# Patient Record
Sex: Female | Born: 1970 | Race: White | Hispanic: Yes | Marital: Single | Smoking: Never smoker
Health system: Southern US, Community
[De-identification: ages and names within clinical notes are randomized; demographics above are authoritative.]

## PROBLEM LIST (undated history)

## (undated) DIAGNOSIS — F329 Major depressive disorder, single episode, unspecified: Secondary | ICD-10-CM

## (undated) DIAGNOSIS — H3530 Unspecified macular degeneration: Secondary | ICD-10-CM

## (undated) DIAGNOSIS — I10 Essential (primary) hypertension: Secondary | ICD-10-CM

## (undated) DIAGNOSIS — F32A Depression, unspecified: Secondary | ICD-10-CM

## (undated) DIAGNOSIS — J302 Other seasonal allergic rhinitis: Secondary | ICD-10-CM

## (undated) DIAGNOSIS — Z86010 Personal history of colonic polyps: Secondary | ICD-10-CM

## (undated) DIAGNOSIS — R87619 Unspecified abnormal cytological findings in specimens from cervix uteri: Secondary | ICD-10-CM

## (undated) DIAGNOSIS — H269 Unspecified cataract: Secondary | ICD-10-CM

## (undated) DIAGNOSIS — Z860101 Personal history of adenomatous and serrated colon polyps: Secondary | ICD-10-CM

## (undated) DIAGNOSIS — O021 Missed abortion: Secondary | ICD-10-CM

## (undated) DIAGNOSIS — N870 Mild cervical dysplasia: Secondary | ICD-10-CM

## (undated) DIAGNOSIS — Z8742 Personal history of other diseases of the female genital tract: Secondary | ICD-10-CM

## (undated) DIAGNOSIS — Z973 Presence of spectacles and contact lenses: Secondary | ICD-10-CM

## (undated) DIAGNOSIS — T7840XA Allergy, unspecified, initial encounter: Secondary | ICD-10-CM

## (undated) DIAGNOSIS — H35039 Hypertensive retinopathy, unspecified eye: Secondary | ICD-10-CM

## (undated) DIAGNOSIS — S7400XA Injury of sciatic nerve at hip and thigh level, unspecified leg, initial encounter: Secondary | ICD-10-CM

## (undated) DIAGNOSIS — B977 Papillomavirus as the cause of diseases classified elsewhere: Secondary | ICD-10-CM

## (undated) DIAGNOSIS — N879 Dysplasia of cervix uteri, unspecified: Secondary | ICD-10-CM

## (undated) DIAGNOSIS — N893 Dysplasia of vagina, unspecified: Secondary | ICD-10-CM

## (undated) DIAGNOSIS — Z87442 Personal history of urinary calculi: Secondary | ICD-10-CM

## (undated) HISTORY — DX: Unspecified cataract: H26.9

## (undated) HISTORY — DX: Allergy, unspecified, initial encounter: T78.40XA

## (undated) HISTORY — DX: Injury of sciatic nerve at hip and thigh level, unspecified leg, initial encounter: S74.00XA

## (undated) HISTORY — DX: Missed abortion: O02.1

## (undated) HISTORY — DX: Unspecified abnormal cytological findings in specimens from cervix uteri: R87.619

## (undated) HISTORY — DX: Essential (primary) hypertension: I10

## (undated) HISTORY — DX: Hypertensive retinopathy, unspecified eye: H35.039

## (undated) HISTORY — DX: Depression, unspecified: F32.A

## (undated) HISTORY — DX: Papillomavirus as the cause of diseases classified elsewhere: B97.7

## (undated) HISTORY — DX: Mild cervical dysplasia: N87.0

---
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
## 2005-06-11 ENCOUNTER — Other Ambulatory Visit: Admission: RE | Admit: 2005-06-11 | Discharge: 2005-06-11 | Payer: Self-pay | Admitting: Obstetrics and Gynecology

## 2006-09-26 ENCOUNTER — Other Ambulatory Visit: Admission: RE | Admit: 2006-09-26 | Discharge: 2006-09-26 | Payer: Self-pay | Admitting: Gynecology

## 2006-10-11 HISTORY — PX: MYOMECTOMY: SHX85

## 2006-11-09 ENCOUNTER — Ambulatory Visit (HOSPITAL_COMMUNITY): Admission: RE | Admit: 2006-11-09 | Discharge: 2006-11-09 | Payer: Self-pay | Admitting: Obstetrics and Gynecology

## 2006-11-25 ENCOUNTER — Encounter (INDEPENDENT_AMBULATORY_CARE_PROVIDER_SITE_OTHER): Payer: Self-pay | Admitting: *Deleted

## 2006-11-25 ENCOUNTER — Ambulatory Visit (HOSPITAL_BASED_OUTPATIENT_CLINIC_OR_DEPARTMENT_OTHER): Admission: RE | Admit: 2006-11-25 | Discharge: 2006-11-25 | Payer: Self-pay | Admitting: Gynecology

## 2006-11-25 HISTORY — PX: HYSTEROSCOPY WITH RESECTOSCOPE: SHX5395

## 2007-09-29 ENCOUNTER — Other Ambulatory Visit: Admission: RE | Admit: 2007-09-29 | Discharge: 2007-09-29 | Payer: Self-pay | Admitting: Gynecology

## 2008-01-16 ENCOUNTER — Other Ambulatory Visit: Admission: RE | Admit: 2008-01-16 | Discharge: 2008-01-16 | Payer: Self-pay | Admitting: Gynecology

## 2008-07-30 ENCOUNTER — Other Ambulatory Visit: Admission: RE | Admit: 2008-07-30 | Discharge: 2008-07-30 | Payer: Self-pay | Admitting: Gynecology

## 2008-07-30 ENCOUNTER — Ambulatory Visit: Payer: Self-pay | Admitting: Gynecology

## 2008-07-30 ENCOUNTER — Encounter: Payer: Self-pay | Admitting: Gynecology

## 2009-04-11 ENCOUNTER — Encounter: Payer: Self-pay | Admitting: Gynecology

## 2009-04-11 ENCOUNTER — Ambulatory Visit: Payer: Self-pay | Admitting: Gynecology

## 2009-04-11 ENCOUNTER — Other Ambulatory Visit: Admission: RE | Admit: 2009-04-11 | Discharge: 2009-04-11 | Payer: Self-pay | Admitting: Gynecology

## 2009-04-21 ENCOUNTER — Ambulatory Visit: Payer: Self-pay | Admitting: Gynecology

## 2009-05-07 ENCOUNTER — Ambulatory Visit (HOSPITAL_COMMUNITY): Admission: RE | Admit: 2009-05-07 | Discharge: 2009-05-07 | Payer: Self-pay | Admitting: Gynecology

## 2009-10-21 ENCOUNTER — Ambulatory Visit: Payer: Self-pay | Admitting: Gynecology

## 2010-02-03 ENCOUNTER — Ambulatory Visit: Payer: Self-pay | Admitting: Gynecology

## 2010-04-20 ENCOUNTER — Other Ambulatory Visit: Admission: RE | Admit: 2010-04-20 | Discharge: 2010-04-20 | Payer: Self-pay | Admitting: Gynecology

## 2010-04-20 ENCOUNTER — Ambulatory Visit: Payer: Self-pay | Admitting: Gynecology

## 2010-04-27 ENCOUNTER — Ambulatory Visit: Payer: Self-pay | Admitting: Gynecology

## 2010-05-26 ENCOUNTER — Ambulatory Visit (HOSPITAL_COMMUNITY): Admission: RE | Admit: 2010-05-26 | Discharge: 2010-05-26 | Payer: Self-pay | Admitting: Gynecology

## 2011-02-26 NOTE — H&P (Signed)
NAMEFIONNA, [name]             ACCOUNT NO.:  [id number]   MEDICAL RECORD NO.:  [id number]          PATIENT TYPE:  AMB   LOCATION:  [facility]                         FACILITY:  [facility]   PHYSICIAN:  Juan H. [name], M.D.DATE OF BIRTH:  1970-12-16   DATE OF ADMISSION:  11/25/2006  DATE OF DISCHARGE:                              HISTORY & PHYSICAL   CHIEF COMPLAINT:  Intrauterine lesion.   HISTORY OF PRESENT ILLNESS:  The patient is a 40 year old gravida 0, who  was seen as a new patient September 26, 2006.  She was noted to have at  time of examination a slightly irregular uterus and ultrasound had been  ordered.  The ultrasound had demonstrated intramural and subserosal  myoma, the largest measuring 34 x 21 mm.  She underwent a  sonohysterogram which demonstrated intrauterine defect measuring 18 x 19  x 10 mm with positive vascular flow from the feeder vessels, right ovary  dominant follicle, normal left ovary.  The patient is scheduled to  undergo resectoscopic polypectomy/myomectomy on Friday, the 15th.   ALLERGIES:  No known drug allergies.   PAST MEDICAL HISTORY:  They are using condoms for contraception.  No  prior surgery reported.  Currently on no medication.   FAMILY HISTORY:  Father with diabetes and mother with hypertension.   PHYSICAL EXAMINATION:  VITAL SIGNS:  The patient weighs 148 pounds,  height 5 feet 4-1/2 inches tall, blood pressure 130/70.  HEENT:  Unremarkable.  NECK:  Supple.  Trachea midline.  No carotid bruits.  No thyromegaly.  LUNGS:  Clear to auscultation without rhonchi or wheezes.  HEART:  Regular rate and rhythm without murmurs or gallops.  BREASTS:  Not done.  ABDOMEN:  Soft and nontender without rebound or guarding.  PELVIC:  Bartholin's, urethra, and Skene's glands within normal limits.  Vagina and cervix with no lesions or discharge.  Uterus anteverted and  slightly irregular.  Adnexa with no palpable mass or tenderness.  RECTAL:  Deferred.   ASSESSMENT:  A 40 year old gravida 0 with several small intramural and  subserosal leiomyoma, but an intracavitary submucous myoma versus a  polyp.  The patient is scheduled to undergo resectoscopic  polypectomy/myomectomy and the patient is scheduled to come by the  office on February 14, for placement of intracervical laminaria.  The  risks, benefits, pros, and cons were discussed with the patient and we  will follow accordingly.   PLAN:  The patient is scheduled for resectoscopic polypectomy/myomectomy  on Friday, February 15, at 9 a.m. at [facility].      Juan H. [name], M.D.  Electronically Signed     JHF/MEDQ  D:  11/24/2006  T:  11/24/2006  Job:  [id number]

## 2011-02-26 NOTE — Op Note (Signed)
[name], [name]             ACCOUNT NO.:  [id number]   MEDICAL RECORD NO.:  [id number]          PATIENT TYPE:  AMB   LOCATION:  [facility]                         FACILITY:  [facility]   PHYSICIAN:  Juan H. [name], M.D.DATE OF BIRTH:  Jun 01, 1971   DATE OF PROCEDURE:  11/25/2006  DATE OF DISCHARGE:                               OPERATIVE REPORT   SURGEON:  Juan H. [name], M.D.   INDICATIONS FOR OPERATION:  [age]-year-old gravida 0 who was  found as an incidental finding at time of annual gynecological  examination to have any regular-shaped uterus, underwent an ultrasound  and there was a questionable intrauterine lesion, so she subsequently  underwent a sonohysterogram which demonstrated intrauterine defect  measuring 18 x 19 x 10 mm with possible vascular flow with feeder  vessels.  The rest of the ultrasound was normal.   PREOPERATIVE DIAGNOSIS:  Intrauterine lesion, suspect endometrial  polyp/myoma.   POSTOPERATIVE DIAGNOSES:  1. Submucous myoma.  2. Endometrial polyps.   PROCEDURE PERFORMED:  1. Resectoscopic polypectomies.  2. Resectoscope myomectomy.   FINDINGS:  Normal endocervical canal.  Several small intrauterine polyps  around the right uterine sidewall and a 2 x 3-cm submucous myoma coming  off the anterior left uterine wall.  No other intracavitary lesions were  noted.  Due to the time of the cycle, the tubal ostia were difficult to  evaluate; only the right side was seen.   DESCRIPTION OF OPERATION:  After the patient adequately counseled, she  was taken to the operating room, where she underwent a successful  general anesthesia.  She had received PSA stockings for DVT prophylaxis  and also had received a gram of cefoxitin for prophylaxis as well.  After the general endotracheal anesthesia was obtained, the abdomen was  prepped and draped in the usual sterile fashion.  The laminaria that was  previously placed the night before was removed.  The  vagina and cervix  were prepped with Betadine solution.  A red rubber Roxan Hockey was inserted  to evacuate the bladder of its contents for 75 mL.  Examination under  anesthesia demonstrated an irregular-shaped uterus, anteverted; a small  anterior subserosal fibroid was noted, no palpable adnexal masses.  A  Graves speculum was introduced into the vaginal wall and a single-tooth  tenaculum was placed onto the anterior cervical lip.  The Lendell Caprice  operative resectoscope with a 90-degree wire loop was inserted into the  intrauterine cavity; 3% sorbitol was the distending media.  In a  systematic fashion, the cervix and uterus were inspected with the above-  mentioned findings.  The [facility] surgical generator set at  80 watts on the cutting mode and 80 watts on the cautery mode was used  and the submucous myoma was resected off its base and passed off the  operative field and the remaining endometrial polyps were excised and  submitted separately as well. The patient did also undergo a vigorous  curettage and that specimen was submitted along with the endometrial  polyps.  The patient tolerated the procedure well.  Pre- and post-  resection pictures were obtained; a  copy will be kept in the patient's  record and one at the office in [facility] and we will show the patient at the  time of her postop visit..  The  single-tooth tenaculum was removed.  The patient was extubated and  transferred to recovery room with stable vital signs.  Blood loss was  minimal.  Fluid resuscitation consisted of 800 mL of lactated Ringer's,  urine output 75 mL and she received a gram of cefoxitin IV and 3%  sorbitol fluid deficit was 140 mL.      Juan H. [name], M.D.  Electronically Signed     JHF/MEDQ  D:  11/25/2006  T:  11/25/2006  Job:  [id number]

## 2011-06-01 ENCOUNTER — Encounter: Payer: Self-pay | Admitting: Gynecology

## 2011-06-02 ENCOUNTER — Encounter: Payer: Self-pay | Admitting: Gynecology

## 2011-06-16 ENCOUNTER — Other Ambulatory Visit (HOSPITAL_COMMUNITY)
Admission: RE | Admit: 2011-06-16 | Discharge: 2011-06-16 | Disposition: A | Discharge status: Home / Self Care | Payer: Commercial Managed Care - PPO | Source: Ambulatory Visit | Attending: Gynecology | Admitting: Gynecology

## 2011-06-16 ENCOUNTER — Encounter: Payer: Self-pay | Admitting: Gynecology

## 2011-06-16 ENCOUNTER — Other Ambulatory Visit: Payer: Self-pay | Admitting: Gynecology

## 2011-06-16 ENCOUNTER — Ambulatory Visit (INDEPENDENT_AMBULATORY_CARE_PROVIDER_SITE_OTHER): Payer: Commercial Managed Care - PPO | Admitting: Gynecology

## 2011-06-16 VITALS — BP 142/86 | Ht 64.5 in | Wt 153.0 lb

## 2011-06-16 DIAGNOSIS — R635 Abnormal weight gain: Secondary | ICD-10-CM

## 2011-06-16 DIAGNOSIS — D251 Intramural leiomyoma of uterus: Secondary | ICD-10-CM

## 2011-06-16 DIAGNOSIS — D259 Leiomyoma of uterus, unspecified: Secondary | ICD-10-CM

## 2011-06-16 DIAGNOSIS — D252 Subserosal leiomyoma of uterus: Secondary | ICD-10-CM

## 2011-06-16 DIAGNOSIS — Z13220 Encounter for screening for lipoid disorders: Secondary | ICD-10-CM

## 2011-06-16 DIAGNOSIS — N852 Hypertrophy of uterus: Secondary | ICD-10-CM

## 2011-06-16 DIAGNOSIS — Z01419 Encounter for gynecological examination (general) (routine) without abnormal findings: Secondary | ICD-10-CM

## 2011-06-16 DIAGNOSIS — N831 Corpus luteum cyst of ovary, unspecified side: Secondary | ICD-10-CM

## 2011-06-16 DIAGNOSIS — I10 Essential (primary) hypertension: Secondary | ICD-10-CM

## 2011-06-16 LAB — COMPREHENSIVE METABOLIC PANEL
Alkaline Phosphatase: 65 U/L (ref 39–117)
CO2: 25 mEq/L (ref 19–32)
Potassium: 4 mEq/L (ref 3.5–5.3)

## 2011-06-16 MED ORDER — LISINOPRIL 20 MG PO TABS: 20.0000 mg | ORAL_TABLET | Freq: Every day | ORAL | Status: DC

## 2011-06-16 NOTE — Progress Notes (Signed)
[name] Aug 19, 1971 [id number]   History:    40 y.o.  for annual exam with history of leiomyomatous uteri. Her primary physician is Dr.Hopper who has been treating her for hypertension and she has not seen him in a year. She is on lisinopril 70 mg daily. Blood pressure today 142/86. Patient asymptomatic. No longer using Depo-Provera for contraception only condoms. Having regular menstrual cycle in doing her monthly self breast examinations. Review of her records indicating she was weighing 146 is up to 153. Last mammogram August of 2011.  Past medical history,surgical history, family history and social history were all reviewed and documented in the EPIC chart.  ROS:  Was performed and pertinent positives and negatives are included in the history.  Exam: chaperone present BP 142/86  Ht 5' 4.5" (1.638 m)  Wt 153 lb (69.4 kg)  BMI 25.86 kg/m2  LMP 06/02/2011  Body mass index is 25.86 kg/(m^2).  General appearance : Well developed well nourished female. No acute distress HEENT: Neck supple, trachea midline, no carotid bruits, no thyroidmegaly Lungs: Clear to auscultation, no rhonchi or wheezes, or rib retractions  Heart: Regular rate and rhythm, no murmurs or gallops Breast:Examined in sitting and supine position were symmetrical in appearance, no palpable masses or tenderness,  no skin retraction, no nipple inversion, no nipple discharge, no skin discoloration, no axillary or supraclavicular lymphadenopathy Abdomen: no palpable masses or tenderness, no rebound or guarding Extremities: no edema or skin discoloration or tenderness  Pelvic:  Bartholin, Urethra, Skene Glands: Within normal limits             Vagina: No gross lesions or discharge  Cervix: No gross lesions or discharge  Uterus  irregular shaped 12 week size  Adnexa difficult to evaluate due to to irregular shaped uterus Anus and perineum  normal   Rectovaginal  normal sphincter tone without palpated masses or  tenderness             Hemoccult not done     Assessment/Plan:  40 y.o. female for annual exam with history of hypertension and overweight. Patient also with leiomyomatous uteri. Ultrasound today to compare with almost 2 years ago demonstrated her uterus measures 11.6 x 8.9 x 7.3 cm with an endometrial stripe of 7.9 mm previous years measurement uterus was 10.5 x 7.7 x 5.8 cm patient with multiple intramural and submucous myoma largest one measuring 43 x 40 mm. Right ovary normal left ovary with thick walled collapse corpus luteum cyst measuring 20 x 12 mm positive color flow in the periphery patient is on day 15 of her cycle currently. Patient is not having any symptoms whatsoever from these fibroids will continue to monitor with ultrasounds on a yearly basis unless she becomes symptomatic. She was scheduled her mammogram for later this year and will continue to do her monthly self breast examination because of her weight gain will be checking her TSH and because of her being on hypertensive medication will check her comprehensive metabolic panel as well as her CBC urinalysis cholesterol and Pap smear. We discussed importance of nutrition and regular exercise. She is to continue taking her calcium and vitamin D for osteoporosis well. We'll see her back in one year or when necessary.    Ok Edwards MD, 3:47 PM 06/16/2011

## 2011-07-27 ENCOUNTER — Other Ambulatory Visit: Payer: Self-pay | Admitting: Gynecology

## 2011-07-27 DIAGNOSIS — Z1231 Encounter for screening mammogram for malignant neoplasm of breast: Secondary | ICD-10-CM

## 2011-08-12 ENCOUNTER — Ambulatory Visit (HOSPITAL_COMMUNITY)
Admission: RE | Admit: 2011-08-12 | Discharge: 2011-08-12 | Disposition: A | Discharge status: Home / Self Care | Payer: Commercial Managed Care - PPO | Source: Ambulatory Visit | Attending: Gynecology | Admitting: Gynecology

## 2011-08-12 DIAGNOSIS — Z1231 Encounter for screening mammogram for malignant neoplasm of breast: Secondary | ICD-10-CM | POA: Insufficient documentation

## 2012-02-26 ENCOUNTER — Emergency Department (HOSPITAL_COMMUNITY): Payer: Commercial Managed Care - PPO

## 2012-02-26 ENCOUNTER — Encounter (HOSPITAL_COMMUNITY): Payer: Self-pay | Admitting: *Deleted

## 2012-02-26 ENCOUNTER — Emergency Department (HOSPITAL_COMMUNITY)
Admission: EM | Admit: 2012-02-26 | Discharge: 2012-02-26 | Disposition: A | Discharge status: Home / Self Care | Payer: Commercial Managed Care - PPO | Attending: Emergency Medicine | Admitting: Emergency Medicine

## 2012-02-26 DIAGNOSIS — M545 Low back pain, unspecified: Secondary | ICD-10-CM | POA: Insufficient documentation

## 2012-02-26 DIAGNOSIS — M5430 Sciatica, unspecified side: Secondary | ICD-10-CM

## 2012-02-26 DIAGNOSIS — M79609 Pain in unspecified limb: Secondary | ICD-10-CM | POA: Insufficient documentation

## 2012-02-26 DIAGNOSIS — I10 Essential (primary) hypertension: Secondary | ICD-10-CM | POA: Insufficient documentation

## 2012-02-26 DIAGNOSIS — IMO0001 Reserved for inherently not codable concepts without codable children: Secondary | ICD-10-CM | POA: Insufficient documentation

## 2012-02-26 DIAGNOSIS — Z79899 Other long term (current) drug therapy: Secondary | ICD-10-CM | POA: Insufficient documentation

## 2012-02-26 LAB — POCT PREGNANCY, URINE: Preg Test, Ur: NEGATIVE

## 2012-02-26 MED ORDER — DEXAMETHASONE SODIUM PHOSPHATE 10 MG/ML IJ SOLN
10.0000 mg | Freq: Once | INTRAMUSCULAR | Status: AC
Start: 2012-02-26 — End: 2012-02-26
  Administered 2012-02-26: 10 mg via INTRAMUSCULAR
  Filled 2012-02-26: qty 1

## 2012-02-26 MED ORDER — METHOCARBAMOL 500 MG PO TABS
1000.0000 mg | ORAL_TABLET | ORAL | Status: AC
  Administered 2012-02-26: 1000 mg via ORAL
  Filled 2012-02-26: qty 2

## 2012-02-26 MED ORDER — METHOCARBAMOL 500 MG PO TABS: 1000.0000 mg | ORAL_TABLET | Freq: Two times a day (BID) | ORAL | Status: AC

## 2012-02-26 MED ORDER — IBUPROFEN 800 MG PO TABS: 800.0000 mg | ORAL_TABLET | Freq: Three times a day (TID) | ORAL | Status: AC

## 2012-02-26 MED ORDER — OXYCODONE-ACETAMINOPHEN 5-325 MG PO TABS
2.0000 | ORAL_TABLET | Freq: Once | ORAL | Status: AC
  Administered 2012-02-26: 2 via ORAL
  Filled 2012-02-26: qty 2

## 2012-02-26 NOTE — ED Provider Notes (Signed)
History     CSN: [id number]  Arrival date & time 02/26/12  7829   First MD Initiated Contact with Patient 02/26/12 0403      Chief Complaint  Patient presents with  . Back Pain    (Consider location/radiation/quality/duration/timing/severity/associated sxs/prior treatment) Patient is a 41 y.o. female presenting with back pain. The history is provided by the patient and a relative. No language interpreter was used.  Back Pain  This is a new problem. The current episode started more than 2 days ago. The problem occurs constantly. The problem has not changed since onset.The pain is associated with lifting heavy objects. The pain is present in the lumbar spine and gluteal region. The quality of the pain is described as stabbing. The pain radiates to the right thigh. The pain is at a severity of 10/10. The pain is severe. Exacerbated by: nothing. The pain is the same all the time. Pertinent negatives include no chest pain, no fever, no numbness, no weight loss, no headaches, no abdominal pain, no abdominal swelling, no bowel incontinence, no perianal numbness, no bladder incontinence, no dysuria, no pelvic pain, no paresthesias, no paresis, no tingling and no weakness. She has tried nothing for the symptoms. The treatment provided no relief. Risk factors: none.    Past Medical History  Diagnosis Date  . High risk HPV infection   . CIN I (cervical intraepithelial neoplasia I)   . Hypertension     Past Surgical History  Procedure Date  . Myomectomy 2008    Family History  Problem Relation Age of Onset  . Hypertension Mother   . Diabetes Father   . Diabetes Maternal Grandmother   . Hypertension Maternal Grandmother     History  Substance Use Topics  . Smoking status: Never Smoker   . Smokeless tobacco: Never Used  . Alcohol Use: No    OB History    Grav Para Term Preterm Abortions TAB SAB Ect Mult Living   0               Review of Systems  Constitutional: Negative for  fever and weight loss.  Cardiovascular: Negative for chest pain.  Gastrointestinal: Negative for abdominal pain and bowel incontinence.  Genitourinary: Negative for bladder incontinence, dysuria, difficulty urinating and pelvic pain.  Musculoskeletal: Positive for back pain.  Neurological: Negative for tingling, weakness, numbness, headaches and paresthesias.  All other systems reviewed and are negative.    Allergies  Review of patient's allergies indicates no known allergies.  Home Medications   Current Outpatient Rx  Name Route Sig Dispense Refill  . ACETAMINOPHEN 500 MG PO TABS Oral Take 1,000 mg by mouth every 6 (six) hours as needed. For pain    . LISINOPRIL 20 MG PO TABS Oral Take 20 mg by mouth daily.      BP 118/67  Pulse 84  Temp(Src) 97.8 F (36.6 C) (Oral)  Resp 16  SpO2 99%  LMP 02/05/2012  Physical Exam  Constitutional: She appears well-developed and well-nourished. No distress.  HENT:  Head: Normocephalic and atraumatic.  Mouth/Throat: Oropharynx is clear and moist.  Eyes: Conjunctivae are normal. Pupils are equal, round, and reactive to light.  Neck: Normal range of motion. Neck supple.  Cardiovascular: Normal rate and regular rhythm.   Pulmonary/Chest: Effort normal and breath sounds normal.  Abdominal: Soft. Bowel sounds are normal.  Musculoskeletal: Normal range of motion. She exhibits no tenderness.  Neurological: She is alert. She has normal reflexes.  5/5 lower extremity strength, L5/s1 intact intact perineal sensation  Skin: Skin is warm and dry.  Psychiatric: She has a normal mood and affect.    ED Course  Procedures (including critical care time)   Labs Reviewed  POCT PREGNANCY, URINE   Dg Lumbar Spine Complete  02/26/2012  *RADIOLOGY REPORT*  Clinical Data: Right-sided back pain, radiates down the right leg  LUMBAR SPINE - COMPLETE 4+ VIEW  Comparison: None.  Findings: The imaged vertebral bodies and inter-vertebral disc spaces are  maintained. No displaced acute fracture or dislocation identified.   The para-vertebral and overlying soft tissues are within normal limits.  IMPRESSION: No acute osseous abnormality of the lumbar spine.  Original Report Authenticated By: Waneta Martins, M.D.     No diagnosis found.  Note: seen by Dr. Alwyn Ren and prescribed ultram and steroids but did not fill them  MDM  Follow up with your family doctor in 2 days for recheck.  Take all medications as prescribed return for weakness, numbness, inability to pass urine or leakage of urine or stool or any concerns.  Patient verbalizes understanding and agrees to follow up        Keyshia Orwick Smitty Cords, MD 02/26/12 [phone]

## 2012-02-26 NOTE — Discharge Instructions (Signed)
Back Exercises Back exercises help treat and prevent back injuries. The goal is to increase your strength in your belly (abdominal) and back muscles. These exercises can also help with flexibility. Start these exercises when told by your doctor. HOME CARE Back exercises include: Pelvic Tilt.  Lie on your back with your knees bent. Tilt your pelvis until the lower part of your back is against the floor. Hold this position 5 to 10 sec. Repeat this exercise 5 to 10 times.  Knee to Chest.  Pull 1 knee up against your chest and hold for 20 to 30 seconds. Repeat this with the other knee. This may be done with the other leg straight or bent, whichever feels better. Then, pull both knees up against your chest.  Sit-Ups or Curl-Ups.  Bend your knees 90 degrees. Start with tilting your pelvis, and do a partial, slow sit-up. Only lift your upper half 30 to 45 degrees off the floor. Take at least 2 to 3 seonds for each sit-up. Do not do sit-ups with your knees out straight. If partial sit-ups are difficult, simply do the above but with only tightening your belly (abdominal) muscles and holding it as told.  Hip-Lift.  Lie on your back with your knees flexed 90 degrees. Push down with your feet and shoulders as you raise your hips 2 inches off the floor. Hold for 10 seconds, repeat 5 to 10 times.  Back Arches.  Lie on your stomach. Prop yourself up on bent elbows. Slowly press on your hands, causing an arch in your low back. Repeat 3 to 5 times.  Shoulder-Lifts.  Lie face down with arms beside your body. Keep hips and belly pressed to floor as you slowly lift your head and shoulders off the floor.  Do not overdo your exercises. Be careful in the beginning. Exercises may cause you some mild back discomfort. If the pain lasts for more than 15 minutes, stop the exercises until you see your doctor. Improvement with exercise for back problems is slow.  Document Released: 10/30/2010 Document Revised: 09/16/2011  Document Reviewed: 10/30/2010 [employer] Patient Information 2012 [employer]. 

## 2012-02-26 NOTE — ED Notes (Signed)
Pt states pain that runs down her back on her right side from lower back down hamstring. Pt states increased pain with walking and going from a standing to sitting position. Pt admits to picking up something heavy and twisting when pain started.

## 2012-02-26 NOTE — ED Notes (Signed)
Pt denies any pain or questions upon discharge. 

## 2012-06-16 ENCOUNTER — Encounter: Payer: Self-pay | Admitting: Gynecology

## 2012-06-16 ENCOUNTER — Ambulatory Visit (INDEPENDENT_AMBULATORY_CARE_PROVIDER_SITE_OTHER): Payer: Commercial Managed Care - PPO | Admitting: Gynecology

## 2012-06-16 ENCOUNTER — Ambulatory Visit (INDEPENDENT_AMBULATORY_CARE_PROVIDER_SITE_OTHER): Payer: Commercial Managed Care - PPO

## 2012-06-16 VITALS — BP 128/70 | Ht 64.5 in | Wt 158.0 lb

## 2012-06-16 DIAGNOSIS — F411 Generalized anxiety disorder: Secondary | ICD-10-CM

## 2012-06-16 DIAGNOSIS — N870 Mild cervical dysplasia: Secondary | ICD-10-CM | POA: Insufficient documentation

## 2012-06-16 DIAGNOSIS — Z01419 Encounter for gynecological examination (general) (routine) without abnormal findings: Secondary | ICD-10-CM

## 2012-06-16 DIAGNOSIS — D259 Leiomyoma of uterus, unspecified: Secondary | ICD-10-CM

## 2012-06-16 DIAGNOSIS — Z8669 Personal history of other diseases of the nervous system and sense organs: Secondary | ICD-10-CM

## 2012-06-16 DIAGNOSIS — F329 Major depressive disorder, single episode, unspecified: Secondary | ICD-10-CM

## 2012-06-16 DIAGNOSIS — F419 Anxiety disorder, unspecified: Secondary | ICD-10-CM | POA: Insufficient documentation

## 2012-06-16 DIAGNOSIS — F32A Depression, unspecified: Secondary | ICD-10-CM | POA: Insufficient documentation

## 2012-06-16 DIAGNOSIS — Z23 Encounter for immunization: Secondary | ICD-10-CM

## 2012-06-16 MED ORDER — FLUOXETINE HCL 10 MG PO CAPS: 10.0000 mg | ORAL_CAPSULE | Freq: Every day | ORAL | Status: DC

## 2012-06-16 MED ORDER — PAROXETINE HCL ER 12.5 MG PO TB24: 12.5000 mg | ORAL_TABLET | ORAL | Status: DC

## 2012-06-16 NOTE — Patient Instructions (Addendum)
Vacuna contra la difteria, el ttanos, y la tos ferina Lo que usted necesita saber (Diphtheria, Tetanus, and Pertussis [DTaP] Vaccine) POR QU VACUNARSE? La difteria, el ttano y la tos Benetta Spar son enfermedades graves provocadas por bacterias. La difteria y la tos Benetta Spar se [place] de persona a Social worker. El ttano ingresa al cuerpo a travs de cortes o heridas. La difteria produce un recubrimiento denso en la parte trasera de la garganta.  Puede producir problemas para respirar, parlisis, insuficiencia cardaca, e incluso la muerte.  El ttanos causa contracturas dolorosas de los msculos, a menudo en todo el cuerpo.  Puede ocasionar un "bloqueo" de la [place], de modo que es imposible abrir la boca o tragar. El ttanos produce la muerte en alrededor de 2 cada 10 casos.  La tos ferina produce ataques de tos tan fuertes que, en nios, imposibilita comer, beber o respirar. Estos ataques pueden durar semanas.  Puede producir neumona, convulsiones (ataques de espasmos o ausencias), dao cerebral, y la muerte.  La vacuna para la difteria, el ttano y la tos Commodore (DTPa) puede ayudar a Market researcher. La mayor parte de los nios que la reciben estarn protegidos durante toda su niez. Muchos ms nios padeceran estas enfermedades si no fueran vacunados. La DTPa es una versin ms segura de una vacuna anterior denominada DTP. La DTP se ha dejado de Boeing. QUIN DEBE RECIBIR ESTA VACUNA Y CUNDO? Los nios deberan recibir 5 dosis de la vacuna DTPa, una dosis en cada una de las siguientes edades:  2 meses.   4 meses.   6 meses.   15 a 18 meses.   4 a 6 aos.  La vacuna DTPa puede darse en simultneo con otras vacunas. ALGUNOS NIOS NO DEBERAN DARSE LA VACUNA DTPA O DEBERAN ESPERAR  Aquellos nios con trastornos menores, tales como resfros, pueden ser vacunados, pero aquellos con trastornos moderados a graves deberan esperar hasta su recuperacin  para recibir la vacuna DTPa.   Cualquier nio que haya tenido una reaccin alrgica grave luego de una dosis de DTPa no debera recibir otra dosis.   Cualquier nio que haya sufrido una enfermedad cerebral o del sistema nervioso luego de [address]  [address] de haber recibido una dosis de la vacuna DTPa, no debera recibir otra dosis.   Hable con el mdico si el nio:   Ha tenido convulsiones o sufri un colapso luego de una dosis de DTPa.   Ha llorado sin parar durante 3 horas o ms luego de una dosis de DTPa.   Ha tenido fiebre mayor a 105 F (40.6 C) luego de una dosis de DTPa.   Pida ms informacin al profesional que lo asiste. Algunos de estos nios podrn recibir una vacuna que no protege para la tos [place], [place] DT.  NIOS DE MAYOR EDAD Y ADULTOS  La vacuna DTPa no se administra en adolescentes, adultos, o nios mayores a los 7 aos de [place].   Sin embargo, Therapist, art an requieren proteccin. Existe una vacuna llamada Tdap, que es similar a la DTPa. Se recomienda una dosis nica de Tdap en personas desde los 11 a los 64 aos de [place]. Otra vacuna, llamada Td, provee proteccin contra el ttanos y la difteria, pero no contra la tos [place]. Se recomienda su aplicacin cada 10 aos.  CULES SON LOS RIESGOS DE LA VACUNA DTPA?  Enfermarse de difteria, ttanos o pertusis es mucho ms peligroso que recibir la vacuna DTPa.   Sin embargo, una [place], como cualquier  otro medicamento, puede causar problemas serios, como Therapist, art graves. El riesgo de que la vacuna DTPa cause daos graves o la muerte es extremadamente pequeo.  Problemas leves (comunes)  Fiebre (en hasta 1 de cada 4 nios).   Enrojecimiento o inflamacin en el lugar en el que se dio la inyeccin (en hasta 1 de cada 4 nios).   Dolor o sensibilidad en Immunologist en el que se dio la inyeccin (en hasta 1 de cada 4 nios).  Estos problemas ocurren ms a menudo luego de la cuarta y [place] dosis de vacuna DTPa que en las  dosis anteriores. En ocasiones luego de la cuarta o quinta dosis se observa la inflamacin de la pierna o brazo completo en que se ha dado la inyeccin, y puede durar de 1 a 7 das (en hasta 1 nio de cada 30). Otros problemas leves incluyen:  Irritabilidad (en hasta 1 de cada 3 nios).   Cansancio o falta de apetito (en hasta 1 de cada 10 nios).   Vmitos (en hasta 1 de cada 50 nios).  Estos problemas ocurren generalmente de 1 a 3 das luego de la inyeccin. Problemas moderados (poco frecuentes)  Convulsiones (sacudones o fijacin de la mirada) (en hasta 1 de cada 14.000 nios).   Llanto sin parar durante 3 horas o ms (en hasta 1 nio de cada 1.000).   Fiebre alta, mayor a 105 F (40.6 C) (alrededor de 1 nio cada 16.000).  Problemas graves (muy raros)  Automotive engineer grave (menos de 1 por milln de dosis).   Se han informado varios otros problemas graves luego de la aplicacin de la vacuna DTPa. Estos incluyen:   Convulsiones a largo plazo, coma, o reduccin de la conciencia.   Dao permanente al cerebro.  Estos son casos tan poco frecuentes que resulta difcil saber si fueron provocados por la vacuna. Controlar la fiebre es particularmente importante para los nios que han tenido convulsiones, por cualquier motivo. Tambin es importante si otro miembro de la familia ha tenido convulsiones. Puede reducir la fiebre y el dolor dando al nio un analgsico sin aspirina al recibir la vacuna, y durante las siguientes 24 horas, segn las instrucciones del [place]. QU PASA SI HAY UNA REACCIN MODERADA O GRAVE? A qu debo prestar atencin? Cualquier cosa extraa o poco comn, como una reaccin alrgica, fiebre alta o comportamiento extrao. Es muy poco comn que ocurran reacciones alrgicas graves con cualquier vacuna. Si se produjera una, sera dentro de los primeros minutos hasta algunas horas luego de la inyeccin. Podr observar dificultad para respirar, ronquera o silbidos al  respirar, ronchas, palidez, debilidad, frecuencia cardaca elevada, o mareos. Si ocurrieran fiebre o convulsiones, normalmente sera dentro de la primera semana luego de la inyeccin. Qu debo hacer?  Comunquese con el mdico o lleve inmediatamente a la persona a un mdico.   Diga al mdico lo que ocurri, la fecha y hora en que ocurri, y cundo recibi la vacuna.   Pida al mdico, enfermera, o al servicio de salud que complete el informe [employer] efectos adversos de la vacuna (Vaccine Adverse Event Reporting System, VAERS). O, bien puede completar el informe a travs del sitio web de VAERS en [email or web address] o llamando al [phone].  VAERS no proporciona consejos mdicos. EL PROGRAMA NACIONAL DE COMPENSACIN POR LESIONES CAUSADAS POR VACUNAS (NATIONAL VACCINE INJURY COMPENSATION PROGRAM)  En el raro caso en que usted o su hijo hayan tenido una reaccin grave a [name], se ha creado Community education officer  para ayudarlo a Network engineer atencin de los lesionados.   Para obtener detalles acerca del [place] de Compensacin por Lesiones Causadas por [place], llame al [phone] o visite el sitio web del programa en [email or web address]  CMO OBTENER MS INFORMACIN?  Consulte con el profesional que lo asiste. Podr darle el prospecto de la vacuna o sugerirle otras fuentes de informacin.   Llame al programa de vacunacin del departamento de salud local o estatal.   Comunquese con los Centers for [employer] and Prevention (Centros para el control y la prevencin de enfermedades, CDC).   Llame al [phone] (1-800-CDC-INFO).   Visite el sitio web del [employer] de [place], en [email or web address]  CDC Diphtheria, Tetanus, and Pertussis-Spanish VIS (02/24/06) Document Released: 12/24/2008 Document Revised: 09/16/2011 [facility] Patient Information 2012 [place], [place].                                                    Control del  colesterol  Los niveles de colesterol en el organismo estn determinados significativamente por su dieta. Los niveles de colesterol tambin se relacionan con la enfermedad cardaca. El material que sigue ayuda a Software engineer relacin y a Chiropractor qu puede hacer para mantener su corazn sano. No todo el colesterol es [place]. Las lipoprotenas de baja densidad (LDL) forman el colesterol "malo". El colesterol malo puede ocasionar depsitos de grasa que se acumulan en el interior de las arterias. Las lipoprotenas de alta densidad (HDL) es el colesterol "bueno". Ayuda a remover el colesterol LDL "malo" de la [place]. El colesterol es un factor de riesgo muy importante para la enfermedad cardaca. Otros factores de riesgo son la hipertensin arterial, el hbito de fumar, el estrs, la herencia y [place].  El msculo cardaco obtiene el suministro de sangre a travs de las arterias coronarias. Si su colesterol LDL ("malo") est elevado y el HDL ("bueno") es bajo, tiene un factor de riesgo para que se formen depsitos de Holiday representative en las arterias coronarias (los vasos sanguneos que suministran sangre al corazn). Esto hace que haya menos lugar para que la sangre circule. Sin la suficiente sangre y oxgeno, el msculo cardaco no puede funcionar correctamente, y usted podr sentir dolores en el pecho (angina pectoris). Cuando una arteria coronaria se cierra completamente, una parte del msculo cardaco puede morir (infarto de miocardio). CONTROL DEL COLESTEROL Cuando el profesional que lo asiste enva la sangre al laboratorio para Artist nivel de colesterol, puede realizarle tambin un perfil completo de los lpidos. Con esta prueba, se puede determinar la cantidad total de colesterol, as como los niveles de LDL y HDL. Los triglicridos son un tipo de grasa que circula en la sangre y que tambin puede utilizarse para determinar el riesgo de enfermedad cardaca. En la siguiente tabla se establecen los nmeros  ideales: Prueba: Colesterol total  Menos de 200 mg/dl.  Prueba: LDL "colesterol malo"  Menos de 100 mg/dl.   Menos de 70 mg/dl si tiene riesgo muy elevado de sufrir un ataque cardaco o muerte cardaca sbita.  Prueba: HDL "colesterol bueno"  Mujeres: Ms de 50 mg/dl.   Hombres: Ms de 40 mg/dl.  Prueba: Trigliceridos  Menos de 150 mg/dl.  CONTROL DEL COLESTEROL CON DIETA Aunque factores como el ejercicio y el estilo de vida son importantes, la "primera lnea de ataque" es la dieta. Esto se debe  a que se sabe que ciertos alimentos hacen subir el colesterol y otros lo [place]. El objetivo debe ser [employer] Foods alimentos, de modo que tengan un efecto sobre el colesterol y, an ms importante, Microbiologist las grasas saturadas y trans con otros tipos de grasas, como las monoinsaturadas y las poliinsaturadas y cidos grasos omega-3 . En promedio, una persona no debe consumir ms de 15 a 17 g de grasas saturadas por [employer]wide. Las grasas saturadas y trans se consideran grasas "malas", ya que elevan el colesterol LDL. Las grasas saturadas se encuentran principalmente en productos animales como carne, De [place] y crema. Pero esto no significa que usted Marketing executive todas sus comidas favoritas. Actualmente, como lo muestra el cuadro que figura al final de este documento, hay sustitutos de buen sabor, bajos en grasas y en colesterol, para la mayora de los alimentos que a usted Musician. Elija aquellos alimentos alternativos que sean bajos en grasas o sin grasas. Elija cortes de carne del cuarto trasero o lomo ya que estos cortes son los que tienen menor cantidad de grasa y Oncologist. El pollo (sin piel), el pescado, la carne de ternera, y la [place] de [place] molida son excelentes opciones. Elimine las carnes [employer] Foods o el salami. Los [employer] o nada de grasas saturadas. Cuando consuma carne [place], carne de aves de corral, o pescado, hgalo en porciones de 85 gramos (3  onzas). Las grasas trans tambin se llaman "aceites parcialmente hidrogenados". Son aceites manipulados cientficamente de [place] que son slidos a Publishing rights manager, tienen una larga vida y Glass blower/designer sabor y la textura de los alimentos a los que se Scientist, clinical (histocompatibility and immunogenetics). Las grasas trans se encuentran en la [place], [place], crackers y alimentos horneados.  Para hornear y cocinar, el aceite es un excelente sustituto para la [place]. Los aceites monoinsaturados tienen un beneficio particular, ya que se cree que disminuyen el colesterol LDL (colesterol malo) y elevan el HDL. Deber evitar los aceites tropicales saturados como el de coco y el de [place].  Recuerde, adems, que puede comer sin restricciones los grupos de alimentos que son naturalmente libres de grasas saturadas y Neurosurgeon trans, entre los que se incluyen el pescado, las frutas (excepto el [place]), verduras, frijoles, cereales (cebada, arroz, [place], trigo) y las pastas (sin salsas con crema)  IDENTIFIQUE LOS ALIMENTOS QUE DISMINUYEN EL COLESTEROL  Pueden disminuir el colesterol las fibras solubles que estn en las frutas, como las [place], en los vegetales como el brcoli, las patatas y las zanahorias; en las legumbres como frijoles, guisantes y Therapist, occupational; y en los cereales como la cebada. Los alimentos fortificados con fitosteroles tambin Engineer, production. Debe consumir al menos 2 g de estos alimentos a diario para Financial planner de disminucin de [place].  En el supermercado, lea las etiquetas de los envases para identificar los alimentos bajos en grasas saturadas, libres de grasas trans y bajos en [place], . Elija quesos que tengan solo de 2 a 3 g de grasa saturada por onza (28,35 g). Use una margarina que no dae el corazn, [place] de grasas trans o aceite parcialmente hidrogenado. Al comprar alimentos horneados (galletitas dulces y Gaffer) evite el aceite parcialmente hidrogenado. Los panes y bollos debern ser de granos enteros  (harina de maz o de avena entera, en lugar de "harina" o "harina enriquecida"). Compre sopas en lata que no sean cremosas, con bajo contenido de sal y sin grasas adicionadas.  TCNICAS DE PREPARACIN DE LOS ALIMENTOS  Nunca fra los  alimentos en aceite abundante. Si debe frer, hgalo en poco aceite y removiendo [place], porque as se utilizan muy pocas grasas, o utilice un spray antiadherente. Cuando le sea posible, hierva, hornee o ase las carnes y cocine los vegetales al vapor. En vez de A[employer]na con mantequilla o [place], utilice limn y hierbas, pur de Psychologist, educational y canela (para las calabazas y batatas), yogurt y salsa descremados y aderezos para ensaladas bajos en contenido graso.  BAJO EN GRASAS SATURADAS / SUSTITUTOS BAJOS EN GRASA  Carnes / Grasas saturadas (g)  Evite: Bife, corte graso (3 oz/85 g) / 11 g   Elija: Bife, corte magro (3 oz/85 g) / 4 g   Evite: Hamburguesa (3 oz/85 g) / 7 g   Elija:  Hamburguesa magra (3 oz/85 g) / 5 g   Evite: Jamn (3 oz/85 g) / 6 g   Elija:  Jamn magro (3 oz/85 g) / 2.4 g   Evite: Pollo, con piel (3 oz/85 g), Carne oscura / 4 g   Elija:  Pollo, sin piel (3 oz/85 g), Carne oscura / 2 g   Evite: Pollo, con piel (3 oz/85 g), Carne magra / 2.5 g   Elija: Pollo, sin piel (3 oz/85 g), Carne magra / 1 g  Lcteos / Grasas saturadas (g)  Evite: Leche entera (1 taza) / 5 g   Elija: Leche con bajo contenido de grasa, 2% (1 taza) / 3 g   Elija: Leche con bajo contenido de grasa, 1% (1 taza) / 1.5 g   Elija: Leche descremada (1 taza) / 0.3 g   Evite: Queso duro (1 oz/28 g) / 6 g   Elija: Queso descremado (1 oz/28 g) / 2-3 g   Evite: Queso cottage, 4% grasa (1 taza)/ 6.5 g   Elija: Queso cottage con bajo contenido de grasa, 1% grasa (1 taza)/ 1.5 g   Evite: Helado (1 taza) / 9 g   Elija: Sorbete (1 taza) / 2.5 g   Elija: Yogurt helado sin contenido de grasa (1 taza) / 0.3 g   Elija: Barras de fruta congeladas /  vestigios   Evite: Crema batida (1 cucharada) / 3.5 g   Elija: Batidos glac sin lcteos (1 cucharada) / 1 g  Condimentos / Grasas saturadas (g)  Evite: Mayonesa (1 cucharada) / 2 g   Elija: Mayonesa con bajo contenido de grasa (1 cucharada) / 1 g   Evite: Manteca (1 cucharada) / 7 g   Elija: Margarina extra light (1 cucharada) / 1 g   Evite: Aceite de coco (1 cucharada) / 11.8 g   Elija: Aceite de oliva (1 cucharada) / 1.8 g   Elija: Aceite de maz (1 cucharada) / 1.7 g   Elija: Aceite de crtamo (1 cucharada) / 1.2 g   Elija: Aceite de girasol (1 cucharada) / 1.4 g   Elija: Aceite de soja (1 cucharada) / 2.4 g   Elija: Aceite de canola (1 cucharada) / 1 g  Document Released: 09/27/2005 Document Revised: 06/09/2011 [facility] Patient Information 2012 [place], [place].  Ejercicios para perder peso (Exercise to Lose Weight) La actividad fsica y Neomia Dear dieta saludable ayudan a perder peso. El mdico podr sugerirle ejercicios especficos. IDEAS Y CONSEJOS PARA HACER EJERCICIOS  Elija opciones econmicas que disfrute hacer , como caminar, andar en bicicleta o los vdeos para ejercitarse.   Utilice las Microbiologist del ascensor.   Camine durante la hora del almuerzo.   Estacione el auto lejos del Environmental consultant de  trabajo o estudio.   Concurra a un gimnasio o tome clases de gimnasia.   Comience con 5  10 minutos de actividad fsica por da. Ejercite hasta 30 minutos, 4 a 6 das por [address]   Utilice zapatos que tengan un buen soporte y ropas cmodas.   Elongue antes y despus de Company secretary.   Ejercite hasta que aumente la respiracin y el corazn palpite rpido.   Beba agua extra cuando ejercite.   No haga ejercicio Firefighter, sentirse mareado o que le falte mucho el aire.  La actividad fsica puede quemar alrededor de 150 caloras.  Correr 20 cuadras en 15 minutos.   Jugar vley durante 45 a 60 minutos.   Limpiar y encerar el auto durante 45 a 60 minutos.    Jugar ftbol americano de toque.   Caminar 25 cuadras en 35 minutos.   Empujar un cochecito 20 cuadras en 30 minutos.   Jugar baloncesto durante 30 minutos.   Rastrillar hojas secas durante 30 minutos.   Andar en bicicleta 80 cuadras en 30 minutos.   Caminar 30 cuadras en 30 minutos.   Bailar durante 30 minutos.   Quitar la nieve con una pala durante 15 minutos.   Nadar vigorosamente durante 20 minutos.   Subir escaleras durante 15 minutos.   Andar en bicicleta 60 cuadras durante 15 minutos.   Arreglar el jardn entre 30 y 45 minutos.   Saltar a la soga durante 15 minutos.   Limpiar vidrios o pisos durante 45 a 60 minutos.  Document Released: 01/01/2011 Document Revised: 06/09/2011 [facility] Patient Information 2012 [place], [place].   Depresin, en el adolescente y en el adulto (Depression, Adolescent and Adult) La depresin es una enfermedad cierta y tratable. Hay dos tipos de depresin:  La depresin que todos experimentamos en alguna forma. Por ejemplo, la muerte de un ser querido, problemas econmicos o Marketing executive naturales pueden originar o aumentar un estado depresivo.   Por otra parte, la depresin clnica, aparece sin una causa o motivo aparente. En [employer], la depresin es una enfermedad. Puede estar causada por un desequilibrio de los mensajeros qumicos del organismo y del cerebro, o puede ser Neomia Dear respuesta a una enfermedad fsica. El alcohol y otras drogas pueden causar depresin.  DIAGNSTICO El diagnstico de depresin se basa generalmente en los sntomas y la historia clnica. TRATAMIENTO Generalmente el tratamiento para la depresin se [place] en tres categoras. Estos son:  Conservation officer, historic buildings. Existen muchos medicamentos para tratar la depresin. Las respuestas pueden variar y muchas veces es necesario actuar segn el modo de ensayo y error para Chief Strategy Officer cul es el mejor medicamento y la mejor dosis para Sales executive.   La  psicoterapia, tambin denominada [place], ayuda a las personas a Oncologist sus problemas vindolos desde un diferente punto de vista y [employer] comprendan segn su experiencia personal. La psicoterapia tradicional busca el origen del problema en la niez. Otros tipos de psicoterapia se basan en el conflicto actual y buscan la forma de [place]. Si el motivo de la depresin es el consumo de drogas, hay tratamientos disponibles que ayudan a abstenerse del consumo de sustancias qumicas. Generalmente, luego de un tiempo, la depresin [place]. Si hay causas subyacentes que favorecen el uso de sustancias qumicas, debern ser evaluadas.   La terapia electroconvulsiva o tratamiento de shock no se utiliza actualmente. Sin embargo, es un tratamiento muy efectivo para los casos de depresin grave en los que exista peligro de suicidio. Durante la terapia electroconvulsiva  se aplican impulsos elctricos en la cabeza. Estos impulsos ocasionan una convulsin generalizada.Puede ser Lincoln Brigham, pero ocasiona prdida de la memoria de hechos recientes. Algunas veces, esta prdida de memoria puede incluir los ltimos meses.  Hay que [employer] Energy de depresin o [place] de suicidio como una situacin grave. Pida ayuda a Administrator, sports. No espere a ver si la depresin grave mejora con el tiempo sin ayuda. Busque ayuda para usted mismo o para aquellos que lo rodean. En los [employer] nmeros de la National Suicide Help Lines (Lnea nacional de ayuda al suicida) que brindan [place] 24 horas son: 1-800-SUICIDE (734) [phone] Document Released: 09/27/2005 Document Revised: 09/16/2011 [facility] Patient Information 2012 [place], [place].  Ansiedad y crisis de [place] (Anxiety and Panic Attacks) El profesional que lo asiste le ha informado que usted padece ansiedad o crisis de [place]. Este trastorno puede presentarse de [employer]. La mayor parte de las veces las crisis aparecen de modo  repentino y sin aviso. Se producen en cualquier momento del da, incluso durante el sueo, y en cualquier etapa de la vida. Pueden ser muy intensas e inexplicadas. Aunque un ataque de pnico puede ser muy atemorizante, no produce daos fsicos. Algunas veces se desconoce la causa de la ansiedad. La ansiedad es un mecanismo protector del organismo en su respuesta de lucha o escape. Muchas de estas situaciones de percepcin de peligro son en realidad situaciones no fsicas (como la ansiedad de perder el [place]). CAUSAS Las causas de la ansiedad o de un ataque de pnico pueden ser [place]. Los ataques de pnico pueden ocurrir en personas sanas en una serie de circunstancias. Es posible que haya una causa gentica de los ataques de pnico. Algunos medicamentos pueden provocar ansiedad como efecto secundario. SNTOMAS Algunas de las sensaciones ms comunes son:  Terror intenso.   Vahdos, desfallecimiento.   Golpes de fro y Airline pilot.   Temor a Estate manager/land agent.   Sentimiento de irrealidad.   Sudoracin.   Temblores.   Dolor en el pecho y latidos irregulares (palpitaciones).   Sensaciones de [employer] o sofocos.   Sentimiento de peligro inminente y de que la muerte est prxima.   Hormigueo en las extremidades que puede venir de la respiracin agitada.   Alteracin de la realidad (desrealizacin).   Sentirse separado de uno mismo (despersonalizacin).  Estos son los sntomas (problemas) ms comunes y pueden combinarse para presentar la crisis de [place].  DIAGNSTICO La evaluacin que realice el profesional depender del tipo de sntomas que est experimentando. El diagnstico de la ansiedad o de ataque de pnico se realiza cuando no se encuentra ninguna enfermedad fsica que pueda determinarse como la causa de los sntomas. TRATAMIENTO El tratamiento para prevenir la ansiedad y los ataques de pnico incluye:  Automotive engineer las circunstancias que causen ansiedad.   Reaseguro y relajacin.    Ejercicio regular.   Terapias de relajacin, como yoga.   Psicoterapia con un psiquiatra o terapeuta.   Evitar la cafena, el alcohol y las drogas [address]   Medicamentos de prescripcin.  SOLICITE ATENCIN MDICA DE INMEDIATO SI:  Usted experimenta sntomas de ataque de pnico que son distintos a sus sntomas usuales.   Tiene cualquier sntoma que empeora o lo preocupa.  Document Released: 09/27/2005 Document Revised: 09/16/2011 [facility] Patient Information 2012 [place], [place].

## 2012-06-16 NOTE — Progress Notes (Signed)
[name] [birth year] [id number]   History:    41 y.o.  for annual gyn exam which was teary-eyed and begin to cry in the exam room. It appears that she is going through foreclosure in her house and family dispute which has caused her much anxiety and depression. Dr. Alwyn Ren is her primary physician who drew her blood work in may of this year. Patient is not sexually active. Her mammogram was in November 2012 which was normal. She does her monthly self breast examination. She has normal menstrual cycles. She has history of fibroid uterus and her ultrasound in 2012 demonstrated a uterus that measured 11.6 x 8.9 x 7.3 cm. Patient does not recall ever having received the dTap Vaccine. 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 Since that time her Pap smears have been normal. Earlier this year she was treated for sciatica. Patient has a past history of her resectoscopic myomectomy and polypectomy in 2008.  Past medical history,surgical history, family history and social history were all reviewed and documented in the EPIC chart.  Gynecologic History Patient's last menstrual period was 06/07/2012. Contraception: none Last Pap: 2012. Results were: normal Last mammogram: 2012. Results were: normal  Obstetric History OB History    Grav Para Term Preterm Abortions TAB SAB Ect Mult Living   0                ROS: A ROS was performed and pertinent positives and negatives are included in the history.  GENERAL: No fevers or chills. HEENT: No change in vision, no earache, sore throat or sinus congestion. NECK: No pain or stiffness. CARDIOVASCULAR: No chest pain or pressure. No palpitations. PULMONARY: No shortness of breath, cough or wheeze. GASTROINTESTINAL: No abdominal pain, nausea, vomiting or diarrhea, melena or bright red blood per rectum. GENITOURINARY: No urinary frequency, urgency, hesitancy or dysuria.  MUSCULOSKELETAL: No joint or muscle pain, no back pain, no recent trauma. DERMATOLOGIC: No rash, no itching, no lesions. ENDOCRINE: No polyuria, polydipsia, no heat or cold intolerance. No recent change in weight. HEMATOLOGICAL: No anemia or easy bruising or bleeding. NEUROLOGIC: No headache, seizures, numbness, tingling or weakness. PSYCHIATRIC: No depression, no loss of interest in normal activity or change in sleep pattern.     Exam: chaperone present  BP 128/70  Ht 5' 4.5" (1.638 m)  Wt 158 lb (71.668 kg)  BMI 26.70 kg/m2  LMP 06/07/2012  Body mass index is 26.70 kg/(m^2).  General appearance : Well developed well nourished female. No acute distress HEENT: Neck supple, trachea midline, no carotid bruits, no thyroidmegaly Lungs: Clear to auscultation, no rhonchi or wheezes, or rib retractions  Heart: Regular rate and rhythm, no murmurs or gallops Breast:Examined in sitting and supine position were symmetrical in appearance, no palpable masses or tenderness,  no skin retraction, no nipple inversion, no nipple discharge, no skin discoloration, no axillary or supraclavicular lymphadenopathy Abdomen: no palpable masses or tenderness, no rebound or guarding Extremities: no edema or skin discoloration or tenderness  Pelvic:  Bartholin, Urethra, Skene Glands: Within normal limits             Vagina: No gross lesions or discharge  Cervix: No gross lesions or discharge  Uterus  12 weeks size and irregular, normal size, shape and consistency, non-tender and mobile  Adnexa  Without masses or tenderness  Anus and perineum  normal   Rectovaginal  normal sphincter tone  without palpated masses or tenderness             Hemoccult not done  Ultrasound: Uterus measures 11.4 x 7.5 x 6.2 cm. Patient with several fibroids the largest one measuring 32 x 25 mm. Ovaries appeared to be normal here it essentially fibroid uterus unchanged from ultrasound of one year ago.   Assessment/Plan:  41 y.o. female  for annual exam with stable leiomyomatous uteri. Patient not sexually active. New Pap smear screening guidelines discussed. Patient suffering from depression and anxiety will be started on Paxil CR 12.5 mg daily. Risks benefits and pros and cons were discussed with her in Spanish. She has no suicidal ideation and has good family support. She will schedule her mammogram for November of this year. She was encouraged to continue to do her monthly self breast examination. She received the dTap Vaccine today after being counseled and literature formation was provided as well in [place]. We will see her back in one year or when necessary    [facility] MD, 12:01 PM 06/16/2012

## 2012-06-18 ENCOUNTER — Other Ambulatory Visit: Payer: Self-pay | Admitting: Gynecology

## 2012-06-19 ENCOUNTER — Encounter: Payer: Self-pay | Admitting: Gynecology

## 2012-06-19 NOTE — Telephone Encounter (Signed)
We'll give her refill for 30 tablets for she will need to followup with her primary physician who has been taking care of her for her hypertension.

## 2012-06-20 ENCOUNTER — Other Ambulatory Visit: Payer: Self-pay | Admitting: *Deleted

## 2012-06-20 MED ORDER — LISINOPRIL 20 MG PO TABS: 20.0000 mg | ORAL_TABLET | Freq: Every day | ORAL | Status: DC

## 2012-06-20 NOTE — Telephone Encounter (Signed)
I believe I approved this yesterday for 30 tablets only. The rest she will need to get from her primary (FP/Internist)

## 2013-06-19 ENCOUNTER — Encounter: Payer: Self-pay | Admitting: Gynecology

## 2013-06-19 ENCOUNTER — Ambulatory Visit (INDEPENDENT_AMBULATORY_CARE_PROVIDER_SITE_OTHER): Payer: Commercial Managed Care - PPO | Admitting: Gynecology

## 2013-06-19 VITALS — BP 156/94 | Ht 65.5 in | Wt 163.6 lb

## 2013-06-19 DIAGNOSIS — Z789 Other specified health status: Secondary | ICD-10-CM

## 2013-06-19 DIAGNOSIS — M6289 Other specified disorders of muscle: Secondary | ICD-10-CM

## 2013-06-19 DIAGNOSIS — R29898 Other symptoms and signs involving the musculoskeletal system: Secondary | ICD-10-CM

## 2013-06-19 DIAGNOSIS — I10 Essential (primary) hypertension: Secondary | ICD-10-CM

## 2013-06-19 DIAGNOSIS — D219 Benign neoplasm of connective and other soft tissue, unspecified: Secondary | ICD-10-CM

## 2013-06-19 DIAGNOSIS — Z8759 Personal history of other complications of pregnancy, childbirth and the puerperium: Secondary | ICD-10-CM

## 2013-06-19 DIAGNOSIS — D259 Leiomyoma of uterus, unspecified: Secondary | ICD-10-CM

## 2013-06-19 DIAGNOSIS — Z01419 Encounter for gynecological examination (general) (routine) without abnormal findings: Secondary | ICD-10-CM

## 2013-06-19 LAB — COMPREHENSIVE METABOLIC PANEL
AST: 14 U/L (ref 0–37)
Albumin: 4.1 g/dL (ref 3.5–5.2)
Alkaline Phosphatase: 64 U/L (ref 39–117)
Calcium: 9 mg/dL (ref 8.4–10.5)
Chloride: 105 mEq/L (ref 96–112)
Glucose, Bld: 77 mg/dL (ref 70–99)
Potassium: 3.7 mEq/L (ref 3.5–5.3)
Sodium: 137 mEq/L (ref 135–145)
Total Protein: 7.2 g/dL (ref 6.0–8.3)

## 2013-06-19 LAB — CBC WITH DIFFERENTIAL/PLATELET
Basophils Absolute: 0 10*3/uL (ref 0.0–0.1)
Basophils Relative: 0 % (ref 0–1)
HCT: 41.6 % (ref 36.0–46.0)
Hemoglobin: 13.6 g/dL (ref 12.0–15.0)
Lymphocytes Relative: 25 % (ref 12–46)
MCHC: 32.7 g/dL (ref 30.0–36.0)
Monocytes Absolute: 0.5 10*3/uL (ref 0.1–1.0)
Monocytes Relative: 6 % (ref 3–12)
Neutro Abs: 6.3 10*3/uL (ref 1.7–7.7)
Neutrophils Relative %: 69 % (ref 43–77)
WBC: 9.2 10*3/uL (ref 4.0–10.5)

## 2013-06-19 MED ORDER — LISINOPRIL 20 MG PO TABS: 20.0000 mg | ORAL_TABLET | Freq: Every day | ORAL | Status: DC

## 2013-06-19 NOTE — Progress Notes (Signed)
[name] 04-13-1971 [id number]   History:    42 y.o.  for annual gyn exam Who stated that in September 6 of this year she had a D&E for a first trimester miscarriage by another provider. Patient prior to the pregnancy had history of hypertension and had been on lisinopril 20 mg daily and she has been off for several weeks now. Her blood pressure today was 156/94. She has history of fibroid uterus and her ultrasound in 2012 demonstrated a uterus that measured 11.6 x 8.9 x 7.3 cm. 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 Since that time her Pap smears have been normal.Patient has a past history of her resectoscopic myomectomy and polypectomy in 2008.the patient has not had a period since August of 6 which had miscarriage.   Past medical history,surgical history, family history and social history were all reviewed and documented in the EPIC chart.  Gynecologic History Patient's last menstrual period was 03/11/2013. Contraception: none Last Pap: 2012. Results were: normal Last mammogram: 2012. Results were: normal  Obstetric History OB History  Gravida Para Term Preterm AB SAB TAB Ectopic Multiple Living  1 0   1 1    0    # Outcome Date GA Lbr Len/2nd Weight Sex Delivery Anes PTL Lv  1 SAB                ROS: A ROS was performed and pertinent positives and negatives are included in the history.  GENERAL: No fevers or chills. HEENT: No change in vision, no earache, sore throat or sinus congestion. NECK: No pain or stiffness. CARDIOVASCULAR: No chest pain or pressure. No palpitations. PULMONARY: No shortness of breath, cough or wheeze. GASTROINTESTINAL: No abdominal pain, nausea, vomiting or diarrhea, melena or bright red blood per rectum. GENITOURINARY: No urinary frequency, urgency, hesitancy or dysuria. MUSCULOSKELETAL: No joint or muscle pain, no back pain, no recent trauma. DERMATOLOGIC: No rash,  no itching, no lesions. ENDOCRINE: No polyuria, polydipsia, no heat or cold intolerance. No recent change in weight. HEMATOLOGICAL: No anemia or easy bruising or bleeding. NEUROLOGIC: No headache, seizures, numbness, tingling or weakness. PSYCHIATRIC: No depression, no loss of interest in normal activity or change in sleep pattern.     Exam: chaperone present  BP 156/94  Ht 5' 5.5" (1.664 m)  Wt 163 lb 9.6 oz (74.208 kg)  BMI 26.8 kg/m2  LMP 03/11/2013  Body mass index is 26.8 kg/(m^2).  General appearance : Well developed well nourished female. No acute distress HEENT: Neck supple, trachea midline, no carotid bruits, no thyroidmegaly Lungs: Clear to auscultation, no rhonchi or wheezes, or rib retractions  Heart: Regular rate and rhythm, no murmurs or gallops Breast:Examined in sitting and supine position were symmetrical in appearance, no palpable masses or tenderness,  no skin retraction, no nipple inversion, no nipple discharge, no skin discoloration, no axillary or supraclavicular lymphadenopathy Abdomen: no palpable masses or tenderness, no rebound or guarding Extremities: no edema or skin discoloration or tenderness  Pelvic:  Bartholin, Urethra, Skene Glands: Within normal limits             Vagina: No gross lesions or discharge  Cervix: No gross lesions or discharge  Uterus  anteverted, normal size, shape and consistency, non-tender and mobile  Adnexa  Without masses or tenderness  Anus and perineum  normal   Rectovaginal  normal sphincter tone without palpated masses or tenderness  Hemoccult not indicated     Assessment/Plan:  42 y.o. female for annual exam with advanced maternal age when she had a miscarriage August 6 of this year. Patient with prior history of resectoscopic myomectomy/polypectomy. Whether her advanced age or recurrence of submucous myoma may have plate a roll on her recent miscarriage remains to be determined. She will be using barrier  contraception until she returns back to the office after her next cycle so that we can do a sonohysterogram. If there is no intrauterine abnormality she will be a candidate for a Mirena IUD. She will be restarted once again on her lisinopril for her hypertension and was reminded to follow up with her internist as well. The following labs at work today: TSH, CBC, comprehensive metabolic panel and urinalysis as well as vitamin D level. Pap smear not done today the new guidelines were discussed. Patient will need a mammogram at the end of this year.    Ok Edwards MD, 3:02 PM 06/19/2013

## 2013-06-20 LAB — URINALYSIS W MICROSCOPIC + REFLEX CULTURE
Bilirubin Urine: NEGATIVE
Casts: NONE SEEN
Crystals: NONE SEEN
Glucose, UA: NEGATIVE mg/dL
Leukocytes, UA: NEGATIVE
Squamous Epithelial / LPF: NONE SEEN
pH: 7.5 (ref 5.0–8.0)

## 2014-06-24 ENCOUNTER — Encounter: Payer: Self-pay | Admitting: Gynecology

## 2014-06-24 ENCOUNTER — Ambulatory Visit (INDEPENDENT_AMBULATORY_CARE_PROVIDER_SITE_OTHER): Payer: Commercial Managed Care - PPO | Admitting: Gynecology

## 2014-06-24 ENCOUNTER — Other Ambulatory Visit (HOSPITAL_COMMUNITY)
Admission: RE | Admit: 2014-06-24 | Discharge: 2014-06-24 | Disposition: A | Discharge status: Home / Self Care | Payer: Commercial Managed Care - PPO | Source: Ambulatory Visit | Attending: Gynecology | Admitting: Gynecology

## 2014-06-24 VITALS — BP 134/80 | Ht 64.75 in | Wt 160.0 lb

## 2014-06-24 DIAGNOSIS — Z01419 Encounter for gynecological examination (general) (routine) without abnormal findings: Secondary | ICD-10-CM | POA: Insufficient documentation

## 2014-06-24 DIAGNOSIS — I10 Essential (primary) hypertension: Secondary | ICD-10-CM

## 2014-06-24 DIAGNOSIS — Z1151 Encounter for screening for human papillomavirus (HPV): Secondary | ICD-10-CM | POA: Insufficient documentation

## 2014-06-24 DIAGNOSIS — Z23 Encounter for immunization: Secondary | ICD-10-CM

## 2014-06-24 MED ORDER — LISINOPRIL 20 MG PO TABS: 20.0000 mg | ORAL_TABLET | Freq: Every day | ORAL | Status: DC

## 2014-06-24 NOTE — Patient Instructions (Signed)
Influenza Virus Vaccine injection (Fluarix) Qu es este medicamento? La VACUNA ANTIGRIPAL ayuda a disminuir el riesgo de contraer la influenza, tambin conocida como la gripe. La vacuna solo ayuda a protegerle contra algunas cepas de influenza. Esta vacuna no ayuda a reducir Catering manager de contraer influenza pandmica H1N1. Este medicamento puede ser utilizado para otros usos; si tiene alguna pregunta consulte con su proveedor de atencin mdica o con su farmacutico. MARCAS COMERCIALES DISPONIBLES: Fluarix, Fluzone Qu le debo informar a mi profesional de la salud antes de tomar este medicamento? Necesita saber si usted presenta alguno de los siguientes problemas o situaciones: -trastorno de sangrado como hemofilia -fiebre o infeccin -sndrome de Guillain-Barre u otros problemas neurolgicos -problemas del sistema inmunolgico -infeccin por el virus de la inmunodeficiencia humana (VIH) o SIDA -niveles bajos de plaquetas en la sangre -esclerosis mltiple -una Risk analyst o inusual a las vacunas antigripales, a los huevos, protenas de pollo, al ltex, a la gentamicina, a otros medicamentos, alimentos, colorantes o conservantes -si est embarazada o buscando quedar embarazada -si est amamantando a un beb Cmo debo utilizar este medicamento? Esta vacuna se administra mediante inyeccin por va intramuscular. Lo administra un profesional de [employer]	[place]. Recibir una copia de informacin escrita sobre la vacuna antes de cada vacuna. Asegrese de leer este folleto cada vez cuidadosamente. Este folleto puede cambiar con frecuencia. Hable con su pediatra para informarse acerca del uso de este medicamento en nios. Puede requerir atencin especial. Sobredosis: Pngase en contacto inmediatamente con un centro toxicolgico o una sala de urgencia si usted cree que haya tomado demasiado medicamento. ATENCIN: [employer] es solo para usted. No comparta este medicamento con nadie. Qu sucede  si me olvido de una dosis? No se aplica en este caso. Qu puede interactuar con este medicamento? -quimioterapia o radioterapia -medicamentos que suprimen el sistema inmunolgico, tales como etanercept, anakinra, infliximab y adalimumab -medicamentos que tratan o previenen cogulos sanguneos, como warfarina -fenitona -medicamentos esteroideos, como la prednisona o la cortisona -teofilina -vacunas Puede ser que esta lista no menciona todas las posibles interacciones. Informe a su profesional de [employer]	[place] de [employer] productos a base de hierbas, medicamentos de [employer] o suplementos nutritivos que est tomando. Si usted fuma, consume bebidas alcohlicas o si utiliza drogas ilegales, indqueselo tambin a su profesional de [employer]	[place]. Algunas sustancias pueden interactuar con su medicamento. A qu debo estar atento al usar [employer]? Informe a su mdico o a Barrister's clerk de la [employer] todos los efectos secundarios que persistan despus de 3 das. Llame a su proveedor de atencin mdica si se presentan sntomas inusuales dentro de las 6 semanas posteriores a la vacunacin. Es posible que todava pueda contraer la gripe, pero la enfermedad no ser tan fuerte como normalmente. No puede contraer la gripe de esta vacuna. La vacuna antigripal no le protege contra resfros u otras enfermedades que pueden causar [place]. Debe vacunarse cada ao. Qu efectos secundarios puedo tener al [employer] este medicamento? Efectos secundarios que debe informar a su mdico o a Barrister's clerk de la salud tan pronto como sea posible: -reacciones alrgicas como erupcin cutnea, picazn o urticarias, hinchazn de la cara, labios o lengua Efectos secundarios que, por lo general, no requieren atencin mdica (debe informarlos a su mdico o a su profesional de la salud si persisten o si son molestos): -fiebre -dolor de cabeza -molestias y dolores musculares -dolor, sensibilidad, enrojecimiento o Database administrator de la inyeccin -cansancio o debilidad Puede ser que [employer]  no menciona todos los posibles efectos secundarios. Comunquese a su mdico por asesoramiento mdico [employer]. Usted puede informar los efectos secundarios a la FDA por telfono al 1-800-FDA-1088. Dnde debo guardar mi medicina? Esta vacuna se administra solamente en clnicas, farmacias, consultorio mdico u otro consultorio de un profesional de la salud y no Sports coach en su domicilio. ATENCIN: Este folleto es un resumen. Puede ser que no cubra toda la posible informacin. Si usted tiene preguntas acerca de esta medicina, consulte con su mdico, su farmacutico o su profesional de Technical sales engineer.  2015, Elsevier/Gold Standard. (2010-03-31 15:31:40)

## 2014-06-24 NOTE — Progress Notes (Signed)
[name] 1971/07/25 [id number]   History:    43 y.o.  for annual gyn exam with no complaints today. In 2013 she had a D&E for a first trimester miscarriage by another physician.Patient prior to the pregnancy had history of hypertension she is currently on lisinopril 20 mg daily.She has history of fibroid uterus and her ultrasound in 2012 demonstrated a uterus that measured 11.6 x 8.9 x 7.3 cm. 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 Since that time her Pap smears have been normal her last Pap smear was in 2012 which was normal. Patient with past history of resectoscopic myomectomy and polypectomy in 2008.    Past medical history,surgical history, family history and social history were all reviewed and documented in the EPIC chart.  Gynecologic History No LMP recorded. Contraception: condoms Last Pap: 2012. Results were: normal Last mammogram: 2012. Results were: normal  Obstetric History OB History  Gravida Para Term Preterm AB SAB TAB Ectopic Multiple Living  1 0   1 1    0    # Outcome Date GA Lbr Len/2nd Weight Sex Delivery Anes PTL Lv  1 SAB                ROS: A ROS was performed and pertinent positives and negatives are included in the history.  GENERAL: No fevers or chills. HEENT: No change in vision, no earache, sore throat or sinus congestion. NECK: No pain or stiffness. CARDIOVASCULAR: No chest pain or pressure. No palpitations. PULMONARY: No shortness of breath, cough or wheeze. GASTROINTESTINAL: No abdominal pain, nausea, vomiting or diarrhea, melena or bright red blood per rectum. GENITOURINARY: No urinary frequency, urgency, hesitancy or dysuria. MUSCULOSKELETAL: No joint or muscle pain, no back pain, no recent trauma. DERMATOLOGIC: No rash, no itching, no lesions. ENDOCRINE: No polyuria, polydipsia, no heat or cold intolerance. No recent change in weight. HEMATOLOGICAL: No  anemia or easy bruising or bleeding. NEUROLOGIC: No headache, seizures, numbness, tingling or weakness. PSYCHIATRIC: No depression, no loss of interest in normal activity or change in sleep pattern.     Exam: chaperone present  BP 134/80  Ht 5' 4.75" (1.645 m)  Wt 160 lb (72.576 kg)  BMI 26.82 kg/m2  Body mass index is 26.82 kg/(m^2).  General appearance : Well developed well nourished female. No acute distress HEENT: Neck supple, trachea midline, no carotid bruits, no thyroidmegaly Lungs: Clear to auscultation, no rhonchi or wheezes, or rib retractions  Heart: Regular rate and rhythm, no murmurs or gallops Breast:Examined in sitting and supine position were symmetrical in appearance, no palpable masses or tenderness,  no skin retraction, no nipple inversion, no nipple discharge, no skin discoloration, no axillary or supraclavicular lymphadenopathy Abdomen: no palpable masses or tenderness, no rebound or guarding Extremities: no edema or skin discoloration or tenderness  Pelvic:  Bartholin, Urethra, Skene Glands: Within normal limits             Vagina: No gross lesions or discharge, menstrual blood present  Cervix: No gross lesions or discharge  Uterus  anteverted, normal size, shape and consistency, non-tender and mobile  Adnexa  Without masses or tenderness  Anus and perineum  normal   Rectovaginal  normal sphincter tone without palpated masses or tenderness             Hemoccult that indicated     Assessment/Plan:  44 y.o. female for annual  exam will return in a fasting state next week for the following labs: Fasting lipid profile, comprehensive metabolic, TSH, CBC, and urinalysis. Pap smear was done today. Flu vaccine was administered today. She was reminded to schedule her overdue mammogram. We discussed importance of monthly self breast examination. We discussed importance of calcium vitamin D and regular exercise for osteoporosis prevention.  Note: This dictation was  prepared with  Dragon/digital dictation along withSmart phrase technology. Any transcriptional errors that result from this process are unintentional.   Terrance Mass MD, 4:35 PM 06/24/2014

## 2014-06-24 NOTE — Addendum Note (Signed)
Addended by: Thurnell Garbe A on: 06/24/2014 04:56 PM   Modules accepted: Orders

## 2014-06-27 LAB — CYTOLOGY - PAP

## 2014-07-01 ENCOUNTER — Ambulatory Visit: Payer: Commercial Managed Care - PPO

## 2014-07-01 ENCOUNTER — Other Ambulatory Visit: Payer: Self-pay | Admitting: Gynecology

## 2014-07-01 DIAGNOSIS — Z1231 Encounter for screening mammogram for malignant neoplasm of breast: Secondary | ICD-10-CM

## 2014-07-01 DIAGNOSIS — Z01419 Encounter for gynecological examination (general) (routine) without abnormal findings: Secondary | ICD-10-CM

## 2014-07-01 DIAGNOSIS — I10 Essential (primary) hypertension: Secondary | ICD-10-CM

## 2014-07-01 LAB — CBC WITH DIFFERENTIAL/PLATELET
Basophils Absolute: 0.1 10*3/uL (ref 0.0–0.1)
Basophils Relative: 1 % (ref 0–1)
EOS ABS: 0.1 10*3/uL (ref 0.0–0.7)
EOS PCT: 1 % (ref 0–5)
HEMATOCRIT: 37.4 % (ref 36.0–46.0)
Hemoglobin: 12 g/dL (ref 12.0–15.0)
LYMPHS ABS: 1.9 10*3/uL (ref 0.7–4.0)
LYMPHS PCT: 34 % (ref 12–46)
MCH: 26.4 pg (ref 26.0–34.0)
MCHC: 32.1 g/dL (ref 30.0–36.0)
MCV: 82.4 fL (ref 78.0–100.0)
Monocytes Absolute: 0.2 10*3/uL (ref 0.1–1.0)
Monocytes Relative: 4 % (ref 3–12)
Neutro Abs: 3.3 10*3/uL (ref 1.7–7.7)
Neutrophils Relative %: 60 % (ref 43–77)
Platelets: 392 10*3/uL (ref 150–400)
RBC: 4.54 MIL/uL (ref 3.87–5.11)
RDW: 14.7 % (ref 11.5–15.5)
WBC: 5.5 10*3/uL (ref 4.0–10.5)

## 2014-07-01 LAB — COMPREHENSIVE METABOLIC PANEL
ALT: 31 U/L (ref 0–35)
AST: 22 U/L (ref 0–37)
Albumin: 3.9 g/dL (ref 3.5–5.2)
Alkaline Phosphatase: 67 U/L (ref 39–117)
BUN: 13 mg/dL (ref 6–23)
CHLORIDE: 106 meq/L (ref 96–112)
CO2: 25 meq/L (ref 19–32)
CREATININE: 0.69 mg/dL (ref 0.50–1.10)
Calcium: 8.7 mg/dL (ref 8.4–10.5)
Glucose, Bld: 88 mg/dL (ref 70–99)
Potassium: 4.1 mEq/L (ref 3.5–5.3)
Sodium: 137 mEq/L (ref 135–145)
Total Bilirubin: 0.3 mg/dL (ref 0.2–1.2)
Total Protein: 6.7 g/dL (ref 6.0–8.3)

## 2014-07-01 LAB — LIPID PANEL
CHOL/HDL RATIO: 3.5 ratio
CHOLESTEROL: 157 mg/dL (ref 0–200)
HDL: 45 mg/dL (ref >39)
LDL Cholesterol: 98 mg/dL (ref 0–99)
Triglycerides: 68 mg/dL (ref <150)
VLDL: 14 mg/dL (ref 0–40)

## 2014-07-01 LAB — TSH: TSH: 1.042 u[IU]/mL (ref 0.350–4.500)

## 2014-07-02 ENCOUNTER — Ambulatory Visit (HOSPITAL_COMMUNITY)
Admission: RE | Admit: 2014-07-02 | Discharge: 2014-07-02 | Disposition: A | Discharge status: Home / Self Care | Payer: Commercial Managed Care - PPO | Source: Ambulatory Visit | Attending: Gynecology | Admitting: Gynecology

## 2014-07-02 DIAGNOSIS — Z1231 Encounter for screening mammogram for malignant neoplasm of breast: Secondary | ICD-10-CM | POA: Insufficient documentation

## 2014-07-02 LAB — URINALYSIS W MICROSCOPIC + REFLEX CULTURE
Bilirubin Urine: NEGATIVE
Casts: NONE SEEN
Crystals: NONE SEEN
Glucose, UA: NEGATIVE mg/dL
HGB URINE DIPSTICK: NEGATIVE
Ketones, ur: NEGATIVE mg/dL
Leukocytes, UA: NEGATIVE
NITRITE: NEGATIVE
PH: 7.5 (ref 5.0–8.0)
Protein, ur: NEGATIVE mg/dL
SPECIFIC GRAVITY, URINE: 1.019 (ref 1.005–1.030)
Urobilinogen, UA: 0.2 mg/dL (ref 0.0–1.0)

## 2014-07-03 ENCOUNTER — Other Ambulatory Visit: Payer: Self-pay | Admitting: *Deleted

## 2014-07-03 MED ORDER — NITROFURANTOIN MONOHYD MACRO 100 MG PO CAPS: 100.0000 mg | ORAL_CAPSULE | Freq: Two times a day (BID) | ORAL | Status: AC

## 2014-07-04 LAB — URINE CULTURE: Colony Count: >=100000

## 2014-08-12 ENCOUNTER — Encounter: Payer: Self-pay | Admitting: Gynecology

## 2015-06-23 ENCOUNTER — Other Ambulatory Visit: Payer: Self-pay | Admitting: Gynecology

## 2015-06-23 NOTE — Telephone Encounter (Signed)
Yearly exam scheduled with you 06/27/15.

## 2015-06-27 ENCOUNTER — Other Ambulatory Visit: Payer: Self-pay | Admitting: Gynecology

## 2015-06-27 ENCOUNTER — Ambulatory Visit (INDEPENDENT_AMBULATORY_CARE_PROVIDER_SITE_OTHER): Payer: Commercial Managed Care - PPO | Admitting: Gynecology

## 2015-06-27 ENCOUNTER — Encounter: Payer: Self-pay | Admitting: Gynecology

## 2015-06-27 VITALS — BP 130/80 | Ht 64.75 in | Wt 154.0 lb

## 2015-06-27 DIAGNOSIS — I10 Essential (primary) hypertension: Secondary | ICD-10-CM

## 2015-06-27 DIAGNOSIS — N943 Premenstrual tension syndrome: Secondary | ICD-10-CM

## 2015-06-27 DIAGNOSIS — G43829 Menstrual migraine, not intractable, without status migrainosus: Secondary | ICD-10-CM

## 2015-06-27 DIAGNOSIS — D251 Intramural leiomyoma of uterus: Secondary | ICD-10-CM

## 2015-06-27 DIAGNOSIS — Z23 Encounter for immunization: Secondary | ICD-10-CM | POA: Diagnosis not present

## 2015-06-27 DIAGNOSIS — Z01419 Encounter for gynecological examination (general) (routine) without abnormal findings: Secondary | ICD-10-CM | POA: Diagnosis not present

## 2015-06-27 DIAGNOSIS — Z1231 Encounter for screening mammogram for malignant neoplasm of breast: Secondary | ICD-10-CM

## 2015-06-27 LAB — LIPID PANEL
Cholesterol: 187 mg/dL (ref 125–200)
HDL: 51 mg/dL (ref >=46)
LDL Cholesterol: 118 mg/dL (ref <130)
Total CHOL/HDL Ratio: 3.7 Ratio (ref <=5.0)
Triglycerides: 88 mg/dL (ref <150)
VLDL: 18 mg/dL (ref <30)

## 2015-06-27 LAB — CBC WITH DIFFERENTIAL/PLATELET
Basophils Absolute: 0.1 10*3/uL (ref 0.0–0.1)
Basophils Relative: 1 % (ref 0–1)
Eosinophils Absolute: 0.3 10*3/uL (ref 0.0–0.7)
Eosinophils Relative: 4 % (ref 0–5)
HEMATOCRIT: 37.7 % (ref 36.0–46.0)
Hemoglobin: 12.5 g/dL (ref 12.0–15.0)
LYMPHS ABS: 2 10*3/uL (ref 0.7–4.0)
Lymphocytes Relative: 30 % (ref 12–46)
MCH: 26.8 pg (ref 26.0–34.0)
MCHC: 33.2 g/dL (ref 30.0–36.0)
MCV: 80.9 fL (ref 78.0–100.0)
MONO ABS: 0.5 10*3/uL (ref 0.1–1.0)
MPV: 8.8 fL (ref 8.6–12.4)
Monocytes Relative: 7 % (ref 3–12)
Neutro Abs: 3.8 10*3/uL (ref 1.7–7.7)
Neutrophils Relative %: 58 % (ref 43–77)
Platelets: 413 10*3/uL — ABNORMAL HIGH (ref 150–400)
RBC: 4.66 MIL/uL (ref 3.87–5.11)
RDW: 14.4 % (ref 11.5–15.5)
WBC: 6.6 10*3/uL (ref 4.0–10.5)

## 2015-06-27 LAB — TSH: TSH: 0.706 u[IU]/mL (ref 0.350–4.500)

## 2015-06-27 LAB — COMPREHENSIVE METABOLIC PANEL
ALT: 41 U/L — ABNORMAL HIGH (ref 6–29)
AST: 24 U/L (ref 10–35)
Albumin: 4.1 g/dL (ref 3.6–5.1)
Alkaline Phosphatase: 67 U/L (ref 33–115)
BUN: 8 mg/dL (ref 7–25)
CALCIUM: 8.8 mg/dL (ref 8.6–10.2)
CO2: 24 mmol/L (ref 20–31)
Chloride: 105 mmol/L (ref 98–110)
Creat: 0.61 mg/dL (ref 0.50–1.10)
Glucose, Bld: 80 mg/dL (ref 65–99)
Potassium: 3.9 mmol/L (ref 3.5–5.3)
Sodium: 139 mmol/L (ref 135–146)
Total Bilirubin: 0.6 mg/dL (ref 0.2–1.2)
Total Protein: 6.9 g/dL (ref 6.1–8.1)

## 2015-06-27 MED ORDER — ESTRADIOL 0.05 MG/24HR TD PTTW: MEDICATED_PATCH | TRANSDERMAL | Status: DC

## 2015-06-27 MED ORDER — LISINOPRIL 20 MG PO TABS: 20.0000 mg | ORAL_TABLET | Freq: Every day | ORAL | Status: DC

## 2015-06-27 NOTE — Progress Notes (Signed)
[name] 05/10/1971 [id number]   History:    44 y.o.  for annual gyn exam  With complaints of headaches starting to days before the onset of her menses. The rest of the month she has no headaches. She reports normal menstrual cycles. Currently using condoms for contraception.. Review of her record indicated in 2013 she had a D&E for a first trimester miscarriage by another physician.Patient prior to the pregnancy had history of hypertension she is currently on lisinopril 20 mg daily.She has history of fibroid uterus and her ultrasound in 2012 demonstrated a uterus that measured 11.6 x 8.9 x 7.3 cm. 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 Since that time her Pap smears have been normal her last Pap smear was in 2012 which was normal  As well as in 2015.Patient with past history of resectoscopic myomectomy and polypectomy in 2008.   Past medical history,surgical history, family history and social history were all reviewed and documented in the EPIC chart.  Gynecologic History Patient's last menstrual period was 06/17/2015. Contraception: condoms Last Pap:  2015. Results were: normal Last mammogram:  2015. Results were: normal  Obstetric History OB History  Gravida Para Term Preterm AB SAB TAB Ectopic Multiple Living  1 0   1 1    0    # Outcome Date GA Lbr Len/2nd Weight Sex Delivery Anes PTL Lv  1 SAB                ROS: A ROS was performed and pertinent positives and negatives are included in the history.  GENERAL: No fevers or chills. HEENT: No change in vision, no earache, sore throat or sinus congestion. NECK: No pain or stiffness. CARDIOVASCULAR: No chest pain or pressure. No palpitations. PULMONARY: No shortness of breath, cough or wheeze. GASTROINTESTINAL: No abdominal pain, nausea, vomiting or diarrhea, melena or bright red blood per rectum. GENITOURINARY: No urinary frequency,  urgency, hesitancy or dysuria. MUSCULOSKELETAL: No joint or muscle pain, no back pain, no recent trauma. DERMATOLOGIC: No rash, no itching, no lesions. ENDOCRINE: No polyuria, polydipsia, no heat or cold intolerance. No recent change in weight. HEMATOLOGICAL: No anemia or easy bruising or bleeding. NEUROLOGIC: No headache, seizures, numbness, tingling or weakness. PSYCHIATRIC: No depression, no loss of interest in normal activity or change in sleep pattern.     Exam: chaperone present  BP 130/80 mmHg  Ht 5' 4.75" (1.645 m)  Wt 154 lb (69.854 kg)  BMI 25.81 kg/m2  LMP 06/17/2015  Body mass index is 25.81 kg/(m^2).  General appearance : Well developed well nourished female. No acute distress HEENT: Eyes: no retinal hemorrhage or exudates,  Neck supple, trachea midline, no carotid bruits, no thyroidmegaly Lungs: Clear to auscultation, no rhonchi or wheezes, or rib retractions  Heart: Regular rate and rhythm, no murmurs or gallops Breast:Examined in sitting and supine position were symmetrical in appearance, no palpable masses or tenderness,  no skin retraction, no nipple inversion, no nipple discharge, no skin discoloration, no axillary or supraclavicular lymphadenopathy Abdomen: no palpable masses or tenderness, no rebound or guarding Extremities: no edema or skin discoloration or tenderness  Pelvic:  Bartholin, Urethra, Skene Glands: Within normal limits             Vagina: No gross lesions or discharge  Cervix: No gross lesions or discharge  Uterus   anteverted,  10 week size irregular  Adnexa  Without masses or tenderness  Anus and perineum  normal   Rectovaginal  normal sphincter tone without palpated masses or tenderness             Hemoccult  Not indicated     Assessment/Plan:  44 y.o. female for annual exam  With past history of leiomyomatous uteri. Last mammogram 3 years ago. An ultrasound will be ordered for better assessment of her adnexa due to the fact the uterus measured    10-12 week size and irregular. The following screening blood work ordered, fasting blood sugar, conference metabolic panel, TSH, CBC, and urinalysis. Pap smear not indicated this year. Patient was given a requisition to schedule her mammogram.  For her menstrual migraine she is going to be started on low-dose transdermal estrogen patch to begin 2-3 days before her menses once a month (Vivelle-Dot 0.05  Milligram. Risk benefits and pros and cons discussed information was provided.   Terrance Mass MD, 10:54 AM 06/27/2015

## 2015-06-27 NOTE — Patient Instructions (Signed)
Fibroma uterino (Uterine Fibroid) Un fibroma uterino es un crecimiento (tumor) dentro del tero. Este tipo de tumor no es canceroso y no se extiende fuera del tero. Podr tener uno o varios fibromas. Los fibromas pueden variar en tamao, peso y el lugar en que se desarrollan dentro del tero. Algunos pueden llegar a ser bastante grandes. La mayora de los fibromas no necesitan tratamiento mdico, pero algunos pueden causar dolor o sangrado abundante durante los perodos y entre ellos. CAUSAS  Un fibroma es el resultado del desarrollo continuo de una nica clula uterina que sigue creciendo (no regulada) que es diferente al resto de las clulas del cuerpo humano. La mayora de las clulas tiene un mecanismo de control que evita que se reproduzcan de manera descontrolada.  SIGNOS Y SNTOMAS   Hemorragias.  Dolor y sensacin de presin en la pelvis.  Problemas en la vejiga debido al tamao del fibroma.  Infertilidad y abortos espontneos, segn el tamao y la ubicacin del fibroma. DIAGNSTICO  Los fibromas uterinos se diagnostican con un examen fsico. El mdico puede palpar los tumores abultados al realizar el examen de la pelvis. Una ecografa puede indicarse para tener informacin del tamao, la ubicacin y el nmero de tumores.  TRATAMIENTO   El mdico puede considerar que es conveniente esperar y prestar atencin. Esto incluye el control del fibroma por parte del mdico para observar si crece o disminuye su tamao.  Podr indicarle un tratamiento hormonal o el uso de un dispositivo intrauterino (DIU).  En algunos casos es necesaria la ciruga para extirpar el fibroma (miomectoma) o el tero (histerectoma). Esto depender de su situacin. Cuando una mujer desea quedar embarazada y los fibromas interfieren en su fertilidad, el mdico puede recomendar la extirpacin del fibroma.  INSTRUCCIONES PARA EL CUIDADO EN EL HOGAR  Los cuidados en el hogar dependen del tratamiento que haya  recibido. En general:   Cumpla con todas las visitas de control, segn le indique su mdico.  Tome slo medicamentos de venta libre o recetados, segn las indicaciones del mdico. Si le recetaron un tratamiento hormonal, tome los medicamentos hormonales como le indicaron. No tome aspirina. Puede ocasionar hemorragias.  Consulte al mdico si debe tomar pldoras de hierro.  Si sus perodos son molestos pero no tan abundantes, acustese con los pies ligeramente elevados por encima del nivel del corazn. Coloque compresas fras en la zona inferior del abdomen.  Si sus perodos son muy abundantes, anote el nmero de compresas o tampones que usa cada mes. Lleve esta informacin a su consulta mdica.  Incluya vegetales verdes en su dieta. SOLICITE ATENCIN MDICA DE INMEDIATO SI:  Siente dolor o clicos en la pelvis y no puede controlarlos con los medicamentos.  El dolor en la pelvis aumenta de manera repentina.  Aumenta el sangrado entre los perodos o durante los mismos.  Si tiene perodos muy abundantes y debe cambiar un tampn o una toalla higinica cada media hora o menos.  Se siente mareado o tiene episodios de desmayo. Document Released: 09/27/2005 Document Revised: 07/18/2013 ExitCare Patient Information 2015 [employer]. This information is not intended to replace advice given to you by your health care Darryel Diodato. Make sure you discuss any questions you have with your health care Belton Peplinski.  

## 2015-06-28 LAB — URINALYSIS W MICROSCOPIC + REFLEX CULTURE
BILIRUBIN URINE: NEGATIVE
Casts: NONE SEEN [LPF]
Crystals: NONE SEEN [HPF]
GLUCOSE, UA: NEGATIVE
HGB URINE DIPSTICK: NEGATIVE
KETONES UR: NEGATIVE
LEUKOCYTES UA: NEGATIVE
Nitrite: NEGATIVE
Protein, ur: NEGATIVE
Specific Gravity, Urine: 1.017 (ref 1.001–1.035)
WBC, UA: NONE SEEN WBC/HPF (ref <=5)
YEAST: NONE SEEN [HPF]
pH: 8 (ref 5.0–8.0)

## 2015-06-30 ENCOUNTER — Other Ambulatory Visit: Payer: Self-pay | Admitting: Gynecology

## 2015-06-30 DIAGNOSIS — R7989 Other specified abnormal findings of blood chemistry: Secondary | ICD-10-CM

## 2015-06-30 DIAGNOSIS — R945 Abnormal results of liver function studies: Principal | ICD-10-CM

## 2015-06-30 LAB — URINE CULTURE

## 2015-07-04 ENCOUNTER — Ambulatory Visit (HOSPITAL_COMMUNITY)
Admission: RE | Admit: 2015-07-04 | Discharge: 2015-07-04 | Disposition: A | Discharge status: Home / Self Care | Payer: Commercial Managed Care - PPO | Source: Ambulatory Visit | Attending: Gynecology | Admitting: Gynecology

## 2015-07-04 DIAGNOSIS — Z1231 Encounter for screening mammogram for malignant neoplasm of breast: Secondary | ICD-10-CM

## 2015-07-08 ENCOUNTER — Other Ambulatory Visit: Payer: Self-pay

## 2015-07-18 ENCOUNTER — Ambulatory Visit: Payer: Commercial Managed Care - PPO | Admitting: Gynecology

## 2015-07-18 ENCOUNTER — Other Ambulatory Visit: Payer: Commercial Managed Care - PPO

## 2015-07-31 ENCOUNTER — Ambulatory Visit (INDEPENDENT_AMBULATORY_CARE_PROVIDER_SITE_OTHER): Payer: Commercial Managed Care - PPO

## 2015-07-31 ENCOUNTER — Other Ambulatory Visit: Payer: Self-pay | Admitting: Gynecology

## 2015-07-31 ENCOUNTER — Encounter: Payer: Self-pay | Admitting: Gynecology

## 2015-07-31 ENCOUNTER — Ambulatory Visit (INDEPENDENT_AMBULATORY_CARE_PROVIDER_SITE_OTHER): Payer: Commercial Managed Care - PPO | Admitting: Gynecology

## 2015-07-31 DIAGNOSIS — D251 Intramural leiomyoma of uterus: Secondary | ICD-10-CM

## 2015-07-31 DIAGNOSIS — N831 Corpus luteum cyst of ovary, unspecified side: Secondary | ICD-10-CM

## 2015-07-31 DIAGNOSIS — R7989 Other specified abnormal findings of blood chemistry: Secondary | ICD-10-CM | POA: Diagnosis not present

## 2015-07-31 DIAGNOSIS — D473 Essential (hemorrhagic) thrombocythemia: Secondary | ICD-10-CM | POA: Diagnosis not present

## 2015-07-31 DIAGNOSIS — R945 Abnormal results of liver function studies: Secondary | ICD-10-CM

## 2015-07-31 NOTE — Progress Notes (Signed)
   Patient presented to the office today to discuss her ultrasound. She was seen the office for her annual exam September 16 see previous*for detail. Patient with past history of fibroid uterus. Ultrasound 2012 demonstrate she had several fibroids and her uterus measured 11.6 x 0.9 x 7.3 cm. Her ultrasound today demonstrated the following:  Uterus measured 11.5 x 8.1 x 5.9 cm with endometrial stripe of 10.1 mm (last menstrual period 07/18/2015). Patient had a total of 7 fibroids largest 1 measuring 28 x 27 mm slightly decreased in size from 2012. There was a right small corpus luteum cyst with color flow in the periphery noted left ovary was normal. No fluid in the cul-de-sac.  Results of her recent lab work and indicated that her platelet count was elevated at 4 and 13,000 Her recent ALT was elevated at 41. Patient denies any past history of hepatitis on no statin.  Patient otherwise asymptomatic. The rest her blood work was normal. Her mammogram is up-to-date. She's having normal menstrual cycles on the first 2 days are slightly heavy.  Assessment/plan: Stable fibroid uterus. Slightly elevated platelet count and ALT which we will repeat today. If normal we will see her back in one year or when necessary.

## 2015-08-01 LAB — CBC WITH DIFFERENTIAL/PLATELET
Basophils Absolute: 0.1 10*3/uL (ref 0.0–0.1)
Basophils Relative: 1 % (ref 0–1)
EOS PCT: 2 % (ref 0–5)
Eosinophils Absolute: 0.2 10*3/uL (ref 0.0–0.7)
HCT: 39.8 % (ref 36.0–46.0)
HEMOGLOBIN: 12.1 g/dL (ref 12.0–15.0)
LYMPHS ABS: 3.7 10*3/uL (ref 0.7–4.0)
LYMPHS PCT: 37 % (ref 12–46)
MCH: 25.7 pg — ABNORMAL LOW (ref 26.0–34.0)
MCHC: 30.4 g/dL (ref 30.0–36.0)
MCV: 84.5 fL (ref 78.0–100.0)
MONO ABS: 0.4 10*3/uL (ref 0.1–1.0)
MPV: 9.4 fL (ref 8.6–12.4)
Monocytes Relative: 4 % (ref 3–12)
NEUTROS ABS: 5.5 10*3/uL (ref 1.7–7.7)
Neutrophils Relative %: 56 % (ref 43–77)
Platelets: 403 10*3/uL — ABNORMAL HIGH (ref 150–400)
RBC: 4.71 MIL/uL (ref 3.87–5.11)
RDW: 14.3 % (ref 11.5–15.5)
WBC: 9.9 10*3/uL (ref 4.0–10.5)

## 2015-08-01 LAB — AST: AST: 42 U/L — ABNORMAL HIGH (ref 10–35)

## 2015-08-01 LAB — ALT: ALT: 50 U/L — AB (ref 6–29)

## 2015-08-04 ENCOUNTER — Telehealth: Payer: Self-pay | Admitting: *Deleted

## 2015-08-04 DIAGNOSIS — R945 Abnormal results of liver function studies: Principal | ICD-10-CM

## 2015-08-04 DIAGNOSIS — R7989 Other specified abnormal findings of blood chemistry: Secondary | ICD-10-CM

## 2015-08-04 LAB — HEPATITIS PANEL, ACUTE
HCV AB: NEGATIVE
HEP A IGM: NONREACTIVE
Hep B C IgM: NONREACTIVE
Hepatitis B Surface Ag: NEGATIVE

## 2015-08-04 NOTE — Telephone Encounter (Signed)
-----   Message from Ramond Craver, [place] sent at 08/04/2015  9:59 AM EDT ----- Regarding: referral to GI Per Dr. Moshe Salisbury - "Also, please make an appointment for patient at [facility] for this patient with elevated liver function test. "  Just let me know once you get date and time and I will put it on result note and forward to [place] to call her with all the info.  Thank you!!!

## 2015-08-04 NOTE — Telephone Encounter (Signed)
Referral placed they will contact pt to schedule. 

## 2015-08-05 ENCOUNTER — Encounter: Payer: Self-pay | Admitting: Physician Assistant

## 2015-08-05 NOTE — Telephone Encounter (Signed)
Appointment on 08/21/15 @ 9:00am with Nicoletta Ba at [facility]

## 2015-08-06 NOTE — Telephone Encounter (Signed)
Claudia informed patient with the below note

## 2015-08-21 ENCOUNTER — Ambulatory Visit (INDEPENDENT_AMBULATORY_CARE_PROVIDER_SITE_OTHER): Payer: Commercial Managed Care - PPO | Admitting: Physician Assistant

## 2015-08-21 ENCOUNTER — Other Ambulatory Visit (INDEPENDENT_AMBULATORY_CARE_PROVIDER_SITE_OTHER): Payer: Commercial Managed Care - PPO

## 2015-08-21 ENCOUNTER — Encounter: Payer: Self-pay | Admitting: Physician Assistant

## 2015-08-21 VITALS — BP 130/88 | HR 72 | Ht 65.0 in | Wt 155.0 lb

## 2015-08-21 DIAGNOSIS — R7989 Other specified abnormal findings of blood chemistry: Secondary | ICD-10-CM

## 2015-08-21 DIAGNOSIS — R945 Abnormal results of liver function studies: Principal | ICD-10-CM

## 2015-08-21 LAB — SEDIMENTATION RATE: SED RATE: 14 mm/h (ref 0–22)

## 2015-08-21 LAB — PROTIME-INR
INR: 1.2 ratio — ABNORMAL HIGH (ref 0.8–1.0)
Prothrombin Time: 13.3 s — ABNORMAL HIGH (ref 9.6–13.1)

## 2015-08-21 NOTE — Progress Notes (Signed)
Agree with assessment and plan. Recommend labs for chronic liver diseases and US of the liver to start, further recs pending the results.

## 2015-08-21 NOTE — Patient Instructions (Signed)
You have been scheduled for an abdominal ultrasound at [facility] (1st floor of hospital) on 08/28/15 at 9:30 am. Please arrive 15 minutes prior to your appointment for registration. Make certain not to have anything to eat or drink 6 hours prior to your appointment. Should you need to reschedule your appointment, please contact radiology at [phone]. This test typically takes about 30 minutes to perform.  Your physician has requested that you go to the basement for lab work before leaving today.  Please follow up with Dr. Havery Moros in January.

## 2015-08-21 NOTE — Progress Notes (Signed)
Patient ID: [name], female   DOB: 05-16-1971, 44 y.o.   MRN: [id number]   Subjective:    Patient ID: [name], female    DOB: [birth year], 44 y.o.   MRN: [id number]  HPI Trayonna  Is a pleasant 44 year old Hispanic female referred today by Dr. Fernandez/OB/GYN for evaluation of abnormal liver function studies. Patient has no prior history of liver disease that she is aware of and no prior GI evaluation.  Labs were reviewed and liver tests were normal in 2015. She recently has had very minimal elevation of ALTs and AST. Hepatitis A, B, and C serologies are negative.  Patient has no current GI complaints. Appetite is been good, weight has been stable. She denies any problems with abdominal pain and discomfort and nausea etc. She does get occasional heartburn. No changes in bowel habits melena or hematochezia. No regular OTCs supplements her meds takes occasional Tylenol. No regular EtOH. Family history is negative for GI or liver disease as far she is aware. She relates she did have "hepatitis" as an infant  Most recent labs showed and MALT of 50 AST of 42 CBC was unremarkable.  Labs in September 2016 isolated ALT elevation of 41.  Review of Systems Pertinent positive and negative review of systems were noted in the above HPI section.  All other review of systems was otherwise negative.  Outpatient Encounter Prescriptions as of 08/21/2015  Medication Sig  . estradiol (VIVELLE-DOT) 0.05 MG/24HR patch Apply 2-3 days before menses 1 patch  . lisinopril (PRINIVIL,ZESTRIL) 20 MG tablet Take 1 tablet (20 mg total) by mouth daily.   No facility-administered encounter medications on file as of 08/21/2015.   No Known Allergies Patient Active Problem List   Diagnosis Date Noted  . Elevated platelet count ([place]) 07/31/2015  . Elevated LFTs 07/31/2015  . Menstrual migraine without status migrainosus, not intractable 06/27/2015  . CIN I (cervical intraepithelial neoplasia I) 06/16/2012  .  History of sciatica 06/16/2012  . Depression 06/16/2012  . Anxiety 06/16/2012  . Hypertension 06/16/2011  . Fibroid uterus 06/16/2011   Social History   Social History  . Marital Status: Single    Spouse Name: N/A  . Number of Children: N/A  . Years of Education: N/A   Occupational History  . Not on file.   Social History Main Topics  . Smoking status: Never Smoker   . Smokeless tobacco: Never Used  . Alcohol Use: No  . Drug Use: No  . Sexual Activity: Yes    Birth Control/ Protection: Condom   Other Topics Concern  . Not on file   Social History Narrative    Ms. Landaeta's family history includes Diabetes in her father and maternal grandmother; Hypertension in her maternal grandmother and mother.      Objective:    Filed Vitals:   08/21/15 0923  BP: 130/88  Pulse: 72    Physical Exam   Well-developed Hispanic female in no acute distress, pleasant accompanied by an interpreter blood pressure 130/88 pulse 72 height 5 foot 5 weight 155. BMI 25.8 HEENT; nontraumatic cephalic EOMI PERRLA sclera anicteric, Supple ;no JVD, Cardiovascular; regular rate and rhythm with S1-S2 no murmur or gallop, Pulmonary; clear bilaterally, Abdomen; soft nontender ,nondistended bowel sounds are present , no palpable mass or hepatosplenomegaly, Rectal ;exam not done, Ext; no clubbing cyanosis or edema skin warm and dry, Neuropsych ;mood and affect appropriate       Assessment & Plan:    #1 44 yo female with minimal  transaminitis- asymptomatic, and recent hepatitis serologies negative Etiology unclear-r/o fatty liver, autoimmune liver disease , med induced #2 HTN #3Depression  Plan; Chronic hepatic  Markers, PT Schedule upper abdominal  US  Further plans pending results of above Will follow up in office in one month to review above and determine if any further  workup indicated.  Yaqub Arney S Dawit Tankard PA-C 08/21/2015   Cc: No ref. provider found

## 2015-08-22 LAB — ANTI-SMOOTH MUSCLE ANTIBODY, IGG: Smooth Muscle Ab: 8 U (ref <20)

## 2015-08-22 LAB — ANA: ANA: NEGATIVE

## 2015-08-25 LAB — CERULOPLASMIN: CERULOPLASMIN: 27 mg/dL (ref 18–53)

## 2015-08-25 LAB — MITOCHONDRIAL ANTIBODIES: Mitochondrial M2 Ab, IgG: 0.64 (ref <0.91)

## 2015-08-25 LAB — ALPHA-1-ANTITRYPSIN: A-1 Antitrypsin, Ser: 117 mg/dL (ref 83–199)

## 2015-08-28 ENCOUNTER — Ambulatory Visit (HOSPITAL_COMMUNITY): Payer: Commercial Managed Care - PPO

## 2015-10-23 ENCOUNTER — Ambulatory Visit: Payer: Commercial Managed Care - PPO | Admitting: Gastroenterology

## 2016-06-28 ENCOUNTER — Ambulatory Visit (INDEPENDENT_AMBULATORY_CARE_PROVIDER_SITE_OTHER): Payer: Commercial Managed Care - PPO | Admitting: Gynecology

## 2016-06-28 ENCOUNTER — Encounter: Payer: Self-pay | Admitting: Gynecology

## 2016-06-28 VITALS — BP 118/80 | Ht 65.0 in | Wt 163.0 lb

## 2016-06-28 DIAGNOSIS — I10 Essential (primary) hypertension: Secondary | ICD-10-CM | POA: Diagnosis not present

## 2016-06-28 DIAGNOSIS — Z23 Encounter for immunization: Secondary | ICD-10-CM

## 2016-06-28 DIAGNOSIS — Z01419 Encounter for gynecological examination (general) (routine) without abnormal findings: Secondary | ICD-10-CM

## 2016-06-28 DIAGNOSIS — D251 Intramural leiomyoma of uterus: Secondary | ICD-10-CM

## 2016-06-28 LAB — CBC WITH DIFFERENTIAL/PLATELET
BASOS ABS: 83 {cells}/uL (ref 0–200)
BASOS PCT: 1 %
EOS ABS: 83 {cells}/uL (ref 15–500)
Eosinophils Relative: 1 %
HEMATOCRIT: 40.4 % (ref 35.0–45.0)
Hemoglobin: 13.2 g/dL (ref 11.7–15.5)
LYMPHS PCT: 24 %
Lymphs Abs: 1992 cells/uL (ref 850–3900)
MCH: 26.9 pg — AB (ref 27.0–33.0)
MCHC: 32.7 g/dL (ref 32.0–36.0)
MCV: 82.4 fL (ref 80.0–100.0)
MPV: 9.4 fL (ref 7.5–12.5)
Monocytes Absolute: 415 cells/uL (ref 200–950)
Monocytes Relative: 5 %
Neutro Abs: 5727 cells/uL (ref 1500–7800)
Neutrophils Relative %: 69 %
Platelets: 410 10*3/uL — ABNORMAL HIGH (ref 140–400)
RBC: 4.9 MIL/uL (ref 3.80–5.10)
RDW: 14.4 % (ref 11.0–15.0)
WBC: 8.3 10*3/uL (ref 3.8–10.8)

## 2016-06-28 LAB — LIPID PANEL
CHOL/HDL RATIO: 3.6 ratio (ref <=5.0)
CHOLESTEROL: 186 mg/dL (ref 125–200)
HDL: 52 mg/dL (ref >=46)
LDL Cholesterol: 116 mg/dL (ref <130)
Triglycerides: 90 mg/dL (ref <150)
VLDL: 18 mg/dL (ref <30)

## 2016-06-28 LAB — COMPREHENSIVE METABOLIC PANEL
ALK PHOS: 64 U/L (ref 33–115)
ALT: 25 U/L (ref 6–29)
AST: 18 U/L (ref 10–35)
Albumin: 4 g/dL (ref 3.6–5.1)
BUN: 10 mg/dL (ref 7–25)
CALCIUM: 9 mg/dL (ref 8.6–10.2)
CO2: 22 mmol/L (ref 20–31)
Chloride: 104 mmol/L (ref 98–110)
Creat: 0.57 mg/dL (ref 0.50–1.10)
Glucose, Bld: 89 mg/dL (ref 65–99)
POTASSIUM: 3.9 mmol/L (ref 3.5–5.3)
Sodium: 136 mmol/L (ref 135–146)
TOTAL PROTEIN: 7.2 g/dL (ref 6.1–8.1)
Total Bilirubin: 0.6 mg/dL (ref 0.2–1.2)

## 2016-06-28 LAB — TSH: TSH: 0.85 mIU/L

## 2016-06-28 MED ORDER — LISINOPRIL 20 MG PO TABS: 20.0000 mg | ORAL_TABLET | Freq: Every day | ORAL | 11 refills | Status: DC

## 2016-06-28 NOTE — Progress Notes (Signed)
[name] [birth year] [id number]   History:    45 y.o.  for annual gyn exam with no complaints today. Patient would like to receive the flu vaccine today. Patient is fasting for blood work today. Patient with past history of stable small fibroids. Also review of her record indicated that last year her liver function tests the SGOT and  SGPT were elevated and on repeat was still elevated and she was referred to the gastroenterologist who had recommended an ultrasound and she did not follow-up because of expense. There were cerumen there was attributed fatty liver. Patient with history of hypertension and has done well with lisinopril. 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   Past medical history,surgical history, family history and social history were all reviewed and documented in the EPIC chart.  Gynecologic History No LMP recorded. Contraception: condoms Last Pap: 2012 2015. Results were: normal Last mammogram: 2016. Results were: normal  Obstetric History OB History  Gravida Para Term Preterm AB Living  1 0     1 0  SAB TAB Ectopic Multiple Live Births  1            # Outcome Date GA Lbr Len/2nd Weight Sex Delivery Anes PTL Lv  1 SAB                ROS: A ROS was performed and pertinent positives and negatives are included in the history.  GENERAL: No fevers or chills. HEENT: No change in vision, no earache, sore throat or sinus congestion. NECK: No pain or stiffness. CARDIOVASCULAR: No chest pain or pressure. No palpitations. PULMONARY: No shortness of breath, cough or wheeze. GASTROINTESTINAL: No abdominal pain, nausea, vomiting or diarrhea, melena or bright red blood per rectum. GENITOURINARY: No urinary frequency, urgency, hesitancy or  dysuria. MUSCULOSKELETAL: No joint or muscle pain, no back pain, no recent trauma. DERMATOLOGIC: No rash, no itching, no lesions. ENDOCRINE: No polyuria, polydipsia, no heat or cold intolerance. No recent change in weight. HEMATOLOGICAL: No anemia or easy bruising or bleeding. NEUROLOGIC: No headache, seizures, numbness, tingling or weakness. PSYCHIATRIC: No depression, no loss of interest in normal activity or change in sleep pattern.     Exam: chaperone present  BP 118/80   Ht 5\' 5"  (1.651 m)   Wt 163 lb (73.9 kg)   BMI 27.12 kg/m   Body mass index is 27.12 kg/m.  General appearance : Well developed well nourished female. No acute distress HEENT: Eyes: no retinal hemorrhage or exudates,  Neck supple, trachea midline, no carotid bruits, no thyroidmegaly Lungs: Clear to auscultation, no rhonchi or wheezes, or rib retractions  Heart: Regular rate and rhythm, no murmurs or gallops Breast:Examined in sitting and supine position were symmetrical in appearance, no palpable masses or tenderness,  no skin retraction, no nipple inversion, no nipple discharge, no skin discoloration, no axillary or supraclavicular lymphadenopathy Abdomen: no palpable masses or tenderness, no rebound or guarding Extremities: no edema or skin discoloration or tenderness  Pelvic:  Bartholin, Urethra, Skene Glands: Within normal limits             Vagina: No gross lesions or discharge  Cervix: No gross lesions or discharge  Uterus  anteverted, 8-10 week size, , non-tender and mobile  Adnexa  Without masses or  tenderness  Anus and perineum  normal   Rectovaginal  normal sphincter tone without palpated masses or tenderness             Hemoccult not indicated     Assessment/Plan:  45 y.o. female for annual exam with past history of slightly elevated liver function tests. We'll check a comprehensive metabolic panel today along with a fasting lipid profile, TSH, CBC, and urinalysis. Pap smear not indicated. Patient  received the flu vaccine today. Stable fibroid uterus. Patient with normal menstrual cycles. Patient was reminded to schedule her mammogram.   Terrance Mass MD, 10:21 AM 06/28/2016

## 2016-06-28 NOTE — Patient Instructions (Signed)
Influenza Virus Vaccine (Flucelvax) Qu es este medicamento? La VACUNA ANTIGRIPAL ayuda a disminuir el riesgo de contraer la influenza, tambin conocida como la gripe. La vacuna solo ayuda a protegerle contra algunas cepas de influenza. Este medicamento puede ser utilizado para otros usos; si tiene alguna pregunta consulte con su proveedor de atencin mdica o con su farmacutico. Qu le debo informar a mi profesional de la salud antes de tomar este medicamento? Necesita saber si usted presenta alguno de los siguientes problemas o situaciones: -trastorno de sangrado como hemofilia -fiebre o infeccin -sndrome de Guillain-Barre u otros problemas neurolgicos -problemas del sistema inmunolgico -infeccin por el virus de la inmunodeficiencia humana (VIH) o SIDA -niveles bajos de plaquetas en la sangre -esclerosis mltiple -una reaccin alrgica o inusual a las vacunas antigripales, a otros medicamentos, alimentos, colorantes o conservantes -si est embarazada o buscando quedar embarazada -si est amamantando a un beb Cmo debo utilizar este medicamento? Esta vacuna se administra mediante inyeccin por va intramuscular. Lo administra un profesional de [employer]	[place]. Recibir una copia de informacin escrita sobre la vacuna antes de cada vacuna. Asegrese de leer este folleto cada vez cuidadosamente. Este folleto puede cambiar con frecuencia. Hable con su pediatra para informarse acerca del uso de este medicamento en nios. Puede requerir atencin especial. Sobredosis: Pngase en contacto inmediatamente con un centro toxicolgico o una sala de urgencia si usted cree que haya tomado demasiado medicamento. ATENCIN: [employer] es solo para usted. No comparta este medicamento con nadie. Qu sucede si me olvido de una dosis? No se aplica en este caso. Qu puede interactuar con este medicamento? -quimioterapia o radioterapia -medicamentos que suprimen el sistema inmunolgico, tales como  etanercept, anakinra, infliximab y adalimumab -medicamentos que tratan o previenen cogulos sanguneos, como warfarina -fenitona -medicamentos esteroideos, como la prednisona o la cortisona -teofilina -vacunas Puede ser que esta lista no menciona todas las posibles interacciones. Informe a su profesional de [employer]	[place] de [employer] productos a base de hierbas, medicamentos de [place] o suplementos nutritivos que est tomando. Si usted fuma, consume bebidas alcohlicas o si utiliza drogas ilegales, indqueselo tambin a su profesional de [employer]	[place]. Algunas sustancias pueden interactuar con su medicamento. A qu debo estar atento al usar [employer]? Informe a su mdico o a Barrister's clerk de la [employer] todos los efectos secundarios que persistan despus de 3 das. Llame a su proveedor de atencin mdica si se presentan sntomas inusuales dentro de las 6 semanas de recibir esta vacuna. Es posible que todava pueda contraer la gripe, pero la enfermedad no ser tan fuerte como normalmente. No puede contraer la gripe de esta vacuna. La vacuna antigripal no le protege contra resfros u otras enfermedades que pueden causar [place]. Debe vacunarse cada ao. Qu efectos secundarios puedo tener al [employer] este medicamento? Efectos secundarios que debe informar a su mdico o a Barrister's clerk de la salud tan pronto como sea posible: -reacciones alrgicas como erupcin cutnea, picazn o urticarias, hinchazn de la cara, labios o lengua Efectos secundarios que, por lo general, no requieren atencin mdica (debe informarlos a su mdico o a su profesional de la salud si persisten o si son molestos): -fiebre -dolor de cabeza -molestias y dolores musculares -dolor, sensibilidad, enrojecimiento o Estate agent de la inyeccin -cansancio Puede ser que esta lista no menciona todos los posibles efectos secundarios. Comunquese a su mdico por asesoramiento mdico [employer]. Usted  puede informar los efectos secundarios a la FDA por telfono al 1-800-FDA-1088. Dnde  debo guardar mi medicina? Esta vacuna se administrar por un profesional de la salud en una [place], Engineer, mining, consultorio mdico u otro consultorio de un profesional de la salud. No se le suministrar esta vacuna para guardar en su domicilio. ATENCIN: Este folleto es un resumen. Puede ser que no cubra toda la posible informacin. Si usted tiene preguntas acerca de esta medicina, consulte con su mdico, su farmacutico o su profesional de Technical sales engineer.    2016, Elsevier/Gold Standard. (2011-09-13 16:29:16)

## 2016-06-29 LAB — URINALYSIS W MICROSCOPIC + REFLEX CULTURE
BACTERIA UA: NONE SEEN [HPF]
Bilirubin Urine: NEGATIVE
Casts: NONE SEEN [LPF]
Crystals: NONE SEEN [HPF]
GLUCOSE, UA: NEGATIVE
HGB URINE DIPSTICK: NEGATIVE
Ketones, ur: NEGATIVE
Leukocytes, UA: NEGATIVE
Nitrite: NEGATIVE
PROTEIN: NEGATIVE
RBC / HPF: NONE SEEN RBC/HPF (ref <=2)
Specific Gravity, Urine: 1.019 (ref 1.001–1.035)
WBC, UA: NONE SEEN WBC/HPF (ref <=5)
Yeast: NONE SEEN [HPF]
pH: 6.5 (ref 5.0–8.0)

## 2016-06-29 IMAGING — MG MM DIGITAL SCREENING BILAT
6 series · 6 of 6 positions shown · non-contrast
Comparison: Previous exam(s).

CLINICAL DATA: Screening.

EXAM:
DIGITAL SCREENING BILATERAL MAMMOGRAM WITH CAD

[L MLO (1 of 2)]
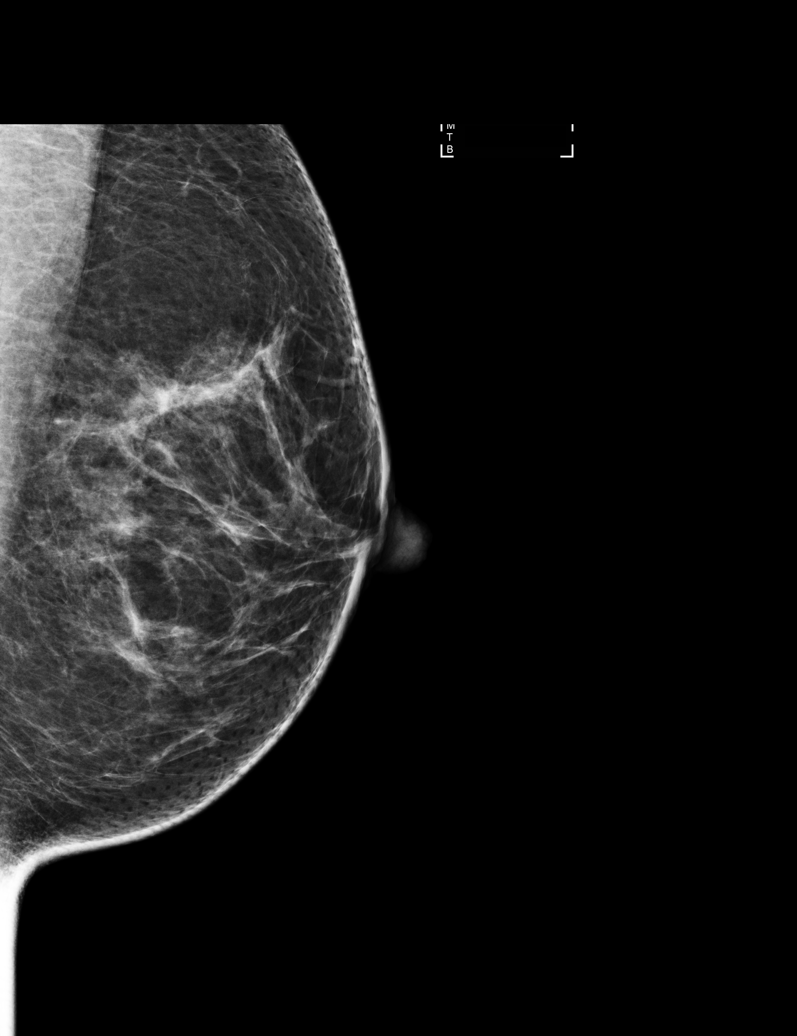

[R MLO (1 of 2)]
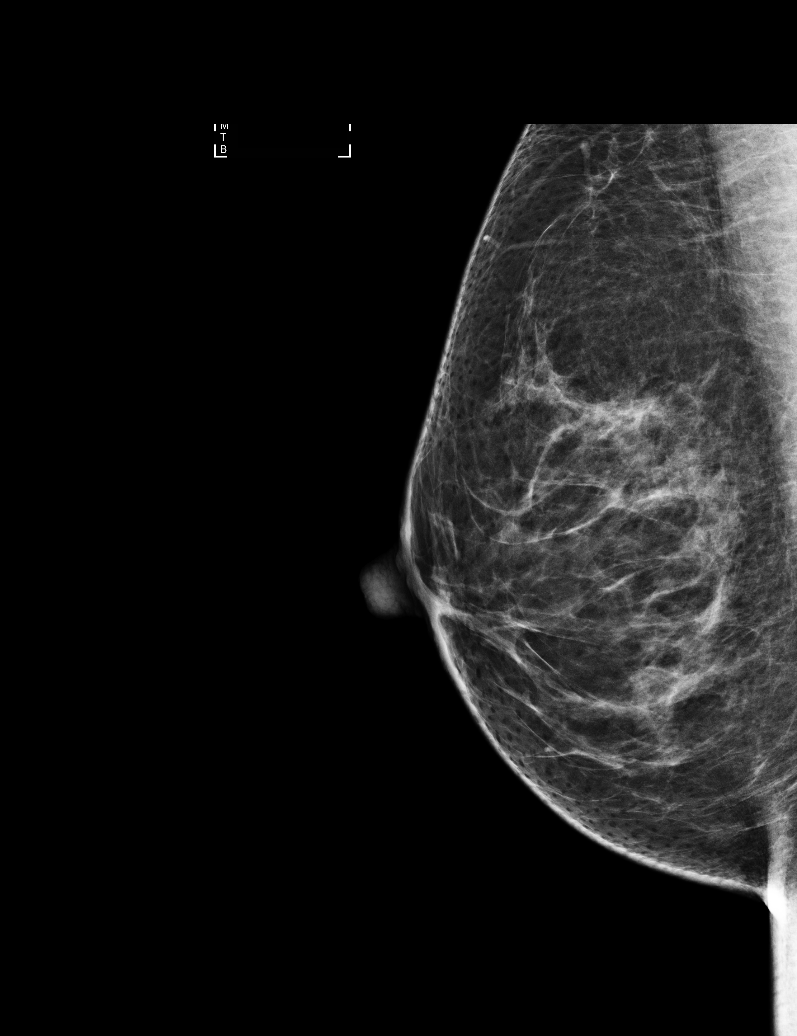

[L MLO (2 of 2)]
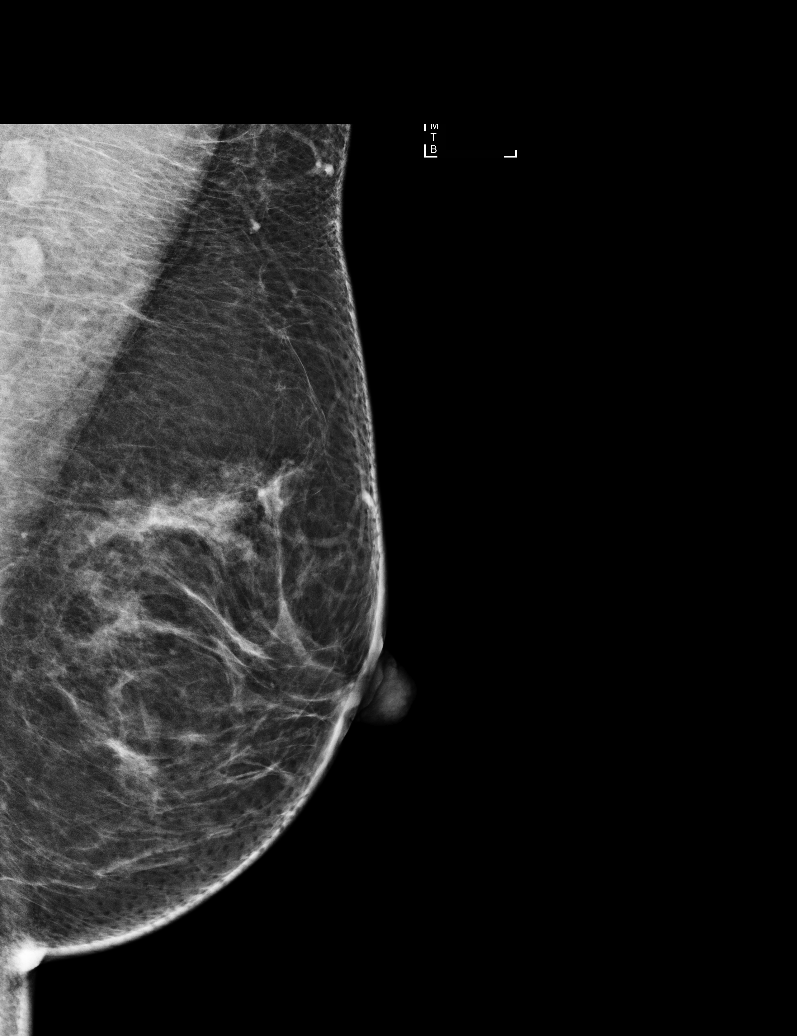

[L CC]
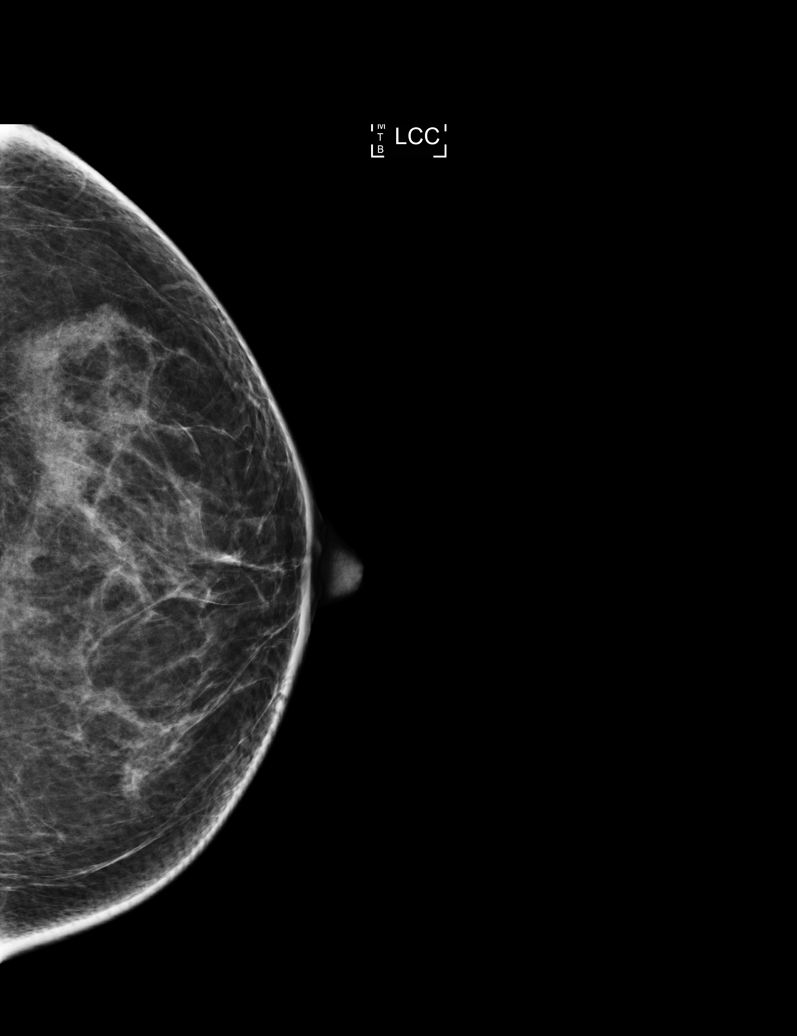

[R MLO (2 of 2)]
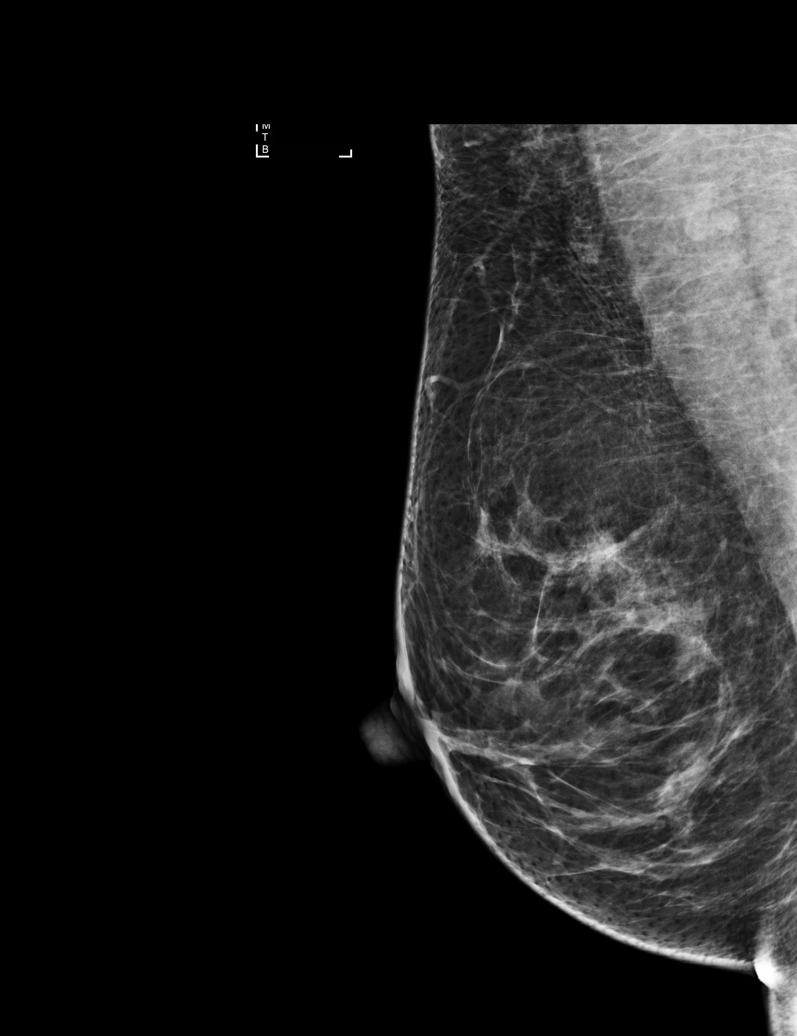

[R CC]
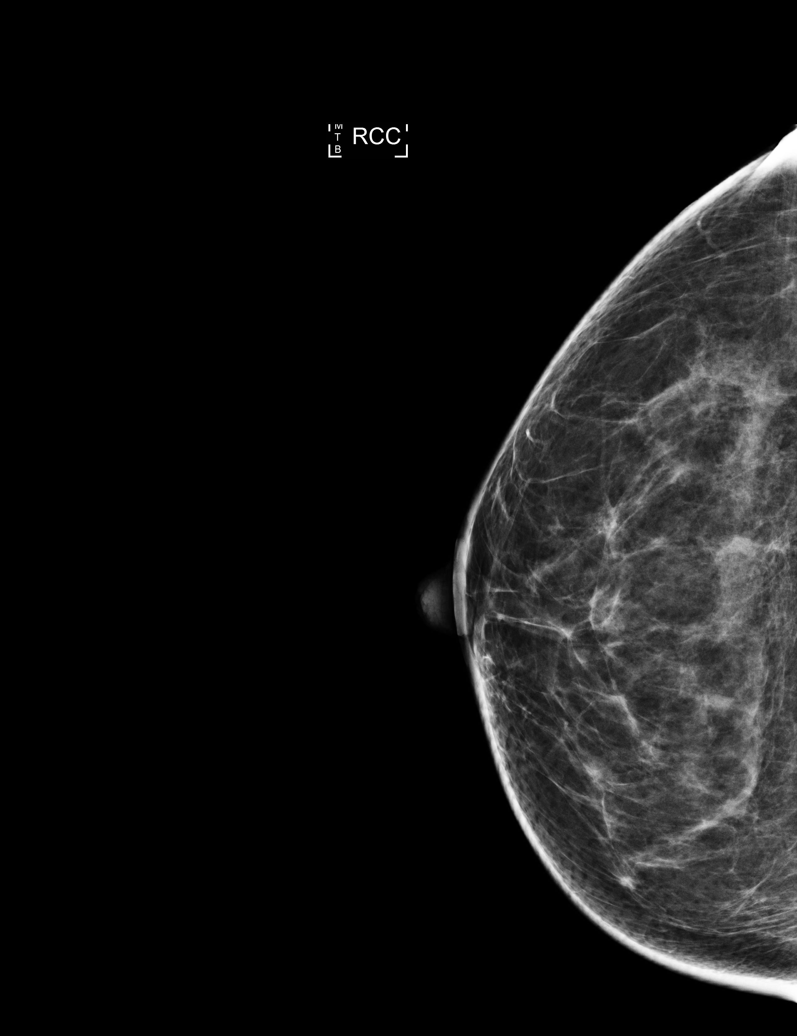

[6 of 6 positions shown; findings below may reference images not displayed]

ACR Breast Density Category b: There are scattered areas of
fibroglandular density.
FINDINGS: There are no findings suspicious for malignancy. Images were
processed with CAD.
IMPRESSION: No mammographic evidence of malignancy. A result letter of this
screening mammogram will be mailed directly to the patient.

RECOMMENDATION:
Screening mammogram in one year. (Code:AS-G-LCT)

BI-RADS CATEGORY  1: Negative.

## 2016-07-01 ENCOUNTER — Other Ambulatory Visit: Payer: Self-pay | Admitting: Gynecology

## 2016-07-01 DIAGNOSIS — R7989 Other specified abnormal findings of blood chemistry: Secondary | ICD-10-CM

## 2016-07-26 ENCOUNTER — Other Ambulatory Visit: Payer: Self-pay | Admitting: Gynecology

## 2016-07-26 DIAGNOSIS — Z1231 Encounter for screening mammogram for malignant neoplasm of breast: Secondary | ICD-10-CM

## 2016-08-03 ENCOUNTER — Ambulatory Visit
Admission: RE | Admit: 2016-08-03 | Discharge: 2016-08-03 | Disposition: A | Discharge status: Home / Self Care | Payer: Commercial Managed Care - PPO | Source: Ambulatory Visit | Attending: Gynecology | Admitting: Gynecology

## 2016-08-03 DIAGNOSIS — Z1231 Encounter for screening mammogram for malignant neoplasm of breast: Secondary | ICD-10-CM

## 2017-02-23 ENCOUNTER — Encounter: Payer: Self-pay | Admitting: Gynecology

## 2017-06-30 ENCOUNTER — Encounter: Payer: Commercial Managed Care - PPO | Admitting: Obstetrics & Gynecology

## 2017-08-03 ENCOUNTER — Ambulatory Visit (INDEPENDENT_AMBULATORY_CARE_PROVIDER_SITE_OTHER): Payer: Commercial Managed Care - PPO | Admitting: Obstetrics & Gynecology

## 2017-08-03 ENCOUNTER — Encounter: Payer: Self-pay | Admitting: Obstetrics & Gynecology

## 2017-08-03 VITALS — BP 154/88 | Ht 64.5 in | Wt 164.0 lb

## 2017-08-03 DIAGNOSIS — Z789 Other specified health status: Secondary | ICD-10-CM

## 2017-08-03 DIAGNOSIS — D219 Benign neoplasm of connective and other soft tissue, unspecified: Secondary | ICD-10-CM

## 2017-08-03 DIAGNOSIS — N920 Excessive and frequent menstruation with regular cycle: Secondary | ICD-10-CM | POA: Diagnosis not present

## 2017-08-03 DIAGNOSIS — Z01419 Encounter for gynecological examination (general) (routine) without abnormal findings: Secondary | ICD-10-CM | POA: Diagnosis not present

## 2017-08-03 DIAGNOSIS — Z1151 Encounter for screening for human papillomavirus (HPV): Secondary | ICD-10-CM | POA: Diagnosis not present

## 2017-08-03 LAB — CBC
HCT: 36.5 % (ref 35.0–45.0)
HEMOGLOBIN: 11.9 g/dL (ref 11.7–15.5)
MCH: 26.3 pg — ABNORMAL LOW (ref 27.0–33.0)
MCHC: 32.6 g/dL (ref 32.0–36.0)
MCV: 80.8 fL (ref 80.0–100.0)
MPV: 9.4 fL (ref 7.5–12.5)
PLATELETS: 420 10*3/uL — AB (ref 140–400)
RBC: 4.52 10*6/uL (ref 3.80–5.10)
RDW: 12.8 % (ref 11.0–15.0)
WBC: 6.8 10*3/uL (ref 3.8–10.8)

## 2017-08-03 NOTE — Progress Notes (Signed)
[name] Aug 17, 1971 [id number]   History:    46 y.o.  [id number] Boyfriend x 3 years  RP:  Established patient presenting for annual gyn exam   HPI:  Menses regular every month.  First 2 days of periods are heavy.  Feels tired, decreased energy.  No pelvic pain.  Normal vaginal secretions.  Breasts wnl.  Mictions/BMs wnl.  BMI 27.72.  Past medical history,surgical history, family history and social history were all reviewed and documented in the EPIC chart.  Gynecologic History Contraception: condoms Last Pap: 2015. Results were: normal Last mammogram: 07/2016. Results were: normal  Obstetric History OB History  Gravida Para Term Preterm AB Living  1 0     1 0  SAB TAB Ectopic Multiple Live Births  1            # Outcome Date GA Lbr Len/2nd Weight Sex Delivery Anes PTL Lv  1 SAB                ROS: A ROS was performed and pertinent positives and negatives are included in the history.  GENERAL: No fevers or chills. HEENT: No change in vision, no earache, sore throat or sinus congestion. NECK: No pain or stiffness. CARDIOVASCULAR: No chest pain or pressure. No palpitations. PULMONARY: No shortness of breath, cough or wheeze. GASTROINTESTINAL: No abdominal pain, nausea, vomiting or diarrhea, melena or bright red blood per rectum. GENITOURINARY: No urinary frequency, urgency, hesitancy or dysuria. MUSCULOSKELETAL: No joint or muscle pain, no back pain, no recent trauma. DERMATOLOGIC: No rash, no itching, no lesions. ENDOCRINE: No polyuria, polydipsia, no heat or cold intolerance. No recent change in weight. HEMATOLOGICAL: No anemia or easy bruising or bleeding. NEUROLOGIC: No headache, seizures, numbness, tingling or weakness. PSYCHIATRIC: No depression, no loss of interest in normal activity or change in sleep pattern.     Exam:   BP (!) 154/88   Ht 5' 4.5" (1.638 m)   Wt 164 lb (74.4 kg)   BMI 27.72 kg/m   Body mass index is 27.72 kg/m.  General appearance : Well  developed well nourished female. No acute distress HEENT: Eyes: no retinal hemorrhage or exudates,  Neck supple, trachea midline, no carotid bruits, no thyroidmegaly Lungs: Clear to auscultation, no rhonchi or wheezes, or rib retractions  Heart: Regular rate and rhythm, no murmurs or gallops Breast:Examined in sitting and supine position were symmetrical in appearance, no palpable masses or tenderness,  no skin retraction, no nipple inversion, no nipple discharge, no skin discoloration, no axillary or supraclavicular lymphadenopathy Abdomen: no palpable masses or tenderness, no rebound or guarding Extremities: no edema or skin discoloration or tenderness  Pelvic: Vulva normal  Bartholin, Urethra, Skene Glands: Within normal limits             Vagina: No gross lesions or discharge  Cervix: No gross lesions or discharge.  Pap/HR HPV done.  Uterus  AV, mild increase in size and nodular shape, shape, non-tender and mobile  Adnexa  Without masses or tenderness  Anus and perineum  normal    Assessment/Plan:  46 y.o. female for annual exam   1. Encounter for routine gynecological examination with Papanicolaou smear of cervix Normal gyn exam.  Pap/HPV HR done.  Breasts wnl.  Will schedule Screening Mammo now.  Fasting Labs today. - CBC - Comp Met (CMET) - Lipid Profile - TSH - Vitamin D 1,25 dihydroxy  2. Use of condoms for contraception   3. Menorrhagia with regular cycle Heavy menses  2 first days.  Will f/u for Pelvic US to r/o Fibroids/IU lesions.  Ibuprofen regularly with menses recommended.  Iron supplements. - US Transvaginal Non-OB; Future  4. 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  Will evaluate size and location by Pelvic US at f/u. - US Transvaginal Non-OB; Future  Counseling on above issues >50% x 10 minutes  Princess Bruins MD, 12:10 PM 08/03/2017

## 2017-08-06 NOTE — Patient Instructions (Signed)
1. Encounter for routine gynecological examination with Papanicolaou smear of cervix Normal gyn exam.  Pap/HPV HR done.  Breasts wnl.  Will schedule Screening Mammo now.  Fasting Labs today. - CBC - Comp Met (CMET) - Lipid Profile - TSH - Vitamin D 1,25 dihydroxy  2. Use of condoms for contraception   3. Menorrhagia with regular cycle Heavy menses 2 first days.  Will f/u for Pelvic US to r/o Fibroids/IU lesions.  Ibuprofen regularly with menses recommended.  Iron supplements. - US Transvaginal Non-OB; Future  4. 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  Will evaluate size and location by Pelvic US at f/u. - US Transvaginal Non-OB; Future  [name], it was a pleasure seeing you today!  I will inform you of yours results as soon as available.   Uterine Fibroids Uterine fibroids are tissue masses (tumors) that can develop in the womb (uterus). They are also called leiomyomas. This type of tumor is not cancerous (benign) and does not spread to other parts of the body outside of the pelvic area, which is between the hip bones. Occasionally, fibroids may develop in the fallopian tubes, in the cervix, or on the support structures (ligaments) that surround the uterus. You can have one or many fibroids. Fibroids can vary in size, weight, and where they grow in the uterus. Some can become quite large. Most fibroids do not require medical treatment. What are the causes? A fibroid can develop when a single uterine cell keeps growing (replicating). Most cells in the human body have a control mechanism that keeps them from replicating without control. What are the signs or symptoms? Symptoms may include:  Heavy bleeding during your period.  Bleeding or spotting between periods.  Pelvic pain and pressure.  Bladder problems, such as needing to urinate more often (urinary frequency) or urgently.  Inability to reproduce offspring (infertility).  Miscarriages.  How is this  diagnosed? Uterine fibroids are diagnosed through a physical exam. Your health care provider may feel the lumpy tumors during a pelvic exam. Ultrasonography and an MRI may be done to determine the size, location, and number of fibroids. How is this treated? Treatment may include:  Watchful waiting. This involves getting the fibroid checked by your health care provider to see if it grows or shrinks. Follow your health care provider's recommendations for how often to have this checked.  Hormone medicines. These can be taken by mouth or given through an intrauterine device (IUD).  Surgery. ? Removing the fibroids (myomectomy) or the uterus (hysterectomy). ? Removing blood supply to the fibroids (uterine artery embolization).  If fibroids interfere with your fertility and you want to become pregnant, your health care provider may recommend having the fibroids removed. Follow these instructions at home:  Keep all follow-up visits as directed by your health care provider. This is important.  Take over-the-counter and prescription medicines only as told by your health care provider. ? If you were prescribed a hormone treatment, take the hormone medicines exactly as directed.  Ask your health care provider about taking iron pills and increasing the amount of dark green, leafy vegetables in your diet. These actions can help to boost your blood iron levels, which may be affected by heavy menstrual bleeding.  Pay close attention to your period and tell your health care provider about any changes, such as: ? Increased blood flow that requires you to use more pads or tampons than usual per month. ? A change in the number of days that your  period lasts per month. ? A change in symptoms that are associated with your period, such as abdominal cramping or back pain. Contact a health care provider if:  You have pelvic pain, back pain, or abdominal cramps that cannot be controlled with medicines.  You  have an increase in bleeding between and during periods.  You soak tampons or pads in a half hour or less.  You feel lightheaded, extra tired, or weak. Get help right away if:  You faint.  You have a sudden increase in pelvic pain. This information is not intended to replace advice given to you by your health care provider. Make sure you discuss any questions you have with your health care provider. Document Released: 09/24/2000 Document Revised: 05/27/2016 Document Reviewed: 03/26/2014 Elsevier Interactive Patient Education  [employer].

## 2017-08-08 LAB — PAP, TP IMAGING W/ HPV RNA, RFLX HPV TYPE 16,18/45: HPV DNA HIGH RISK: DETECTED — AB

## 2017-08-09 LAB — VITAMIN D 1,25 DIHYDROXY
VITAMIN D3 1, 25 (OH): 57 pg/mL
Vitamin D 1, 25 (OH)2 Total: 57 pg/mL (ref 18–72)

## 2017-08-09 LAB — COMPREHENSIVE METABOLIC PANEL
AG RATIO: 1.3 (calc) (ref 1.0–2.5)
ALT: 18 U/L (ref 6–29)
AST: 16 U/L (ref 10–35)
Albumin: 3.9 g/dL (ref 3.6–5.1)
Alkaline phosphatase (APISO): 60 U/L (ref 33–115)
BUN: 9 mg/dL (ref 7–25)
CHLORIDE: 104 mmol/L (ref 98–110)
CO2: 25 mmol/L (ref 20–32)
CREATININE: 0.6 mg/dL (ref 0.50–1.10)
Calcium: 8.7 mg/dL (ref 8.6–10.2)
GLOBULIN: 3 g/dL (ref 1.9–3.7)
GLUCOSE: 80 mg/dL (ref 65–99)
POTASSIUM: 3.7 mmol/L (ref 3.5–5.3)
Sodium: 137 mmol/L (ref 135–146)
Total Bilirubin: 0.3 mg/dL (ref 0.2–1.2)
Total Protein: 6.9 g/dL (ref 6.1–8.1)

## 2017-08-09 LAB — TSH: TSH: 0.79 m[IU]/L

## 2017-08-09 LAB — LIPID PANEL
CHOL/HDL RATIO: 3.6 (calc) (ref <5.0)
Cholesterol: 172 mg/dL (ref <200)
HDL: 48 mg/dL — AB (ref >50)
LDL Cholesterol (Calc): 106 mg/dL (calc) — ABNORMAL HIGH
Non-HDL Cholesterol (Calc): 124 mg/dL (calc) (ref <130)
Triglycerides: 86 mg/dL (ref <150)

## 2017-08-10 ENCOUNTER — Other Ambulatory Visit: Payer: Self-pay

## 2017-08-10 ENCOUNTER — Other Ambulatory Visit: Payer: Self-pay | Admitting: Obstetrics & Gynecology

## 2017-08-10 DIAGNOSIS — Z1231 Encounter for screening mammogram for malignant neoplasm of breast: Secondary | ICD-10-CM

## 2017-08-10 MED ORDER — LISINOPRIL 20 MG PO TABS: 20.0000 mg | ORAL_TABLET | Freq: Every day | ORAL | 12 refills | Status: DC

## 2017-08-10 NOTE — Telephone Encounter (Signed)
Former patient of Dr. Durenda Guthrie.  At 06/28/2016 annual visit Dr. Moshe Salisbury wrote "Patient with history of hypertension and has done well with lisinopril."

## 2017-08-31 ENCOUNTER — Encounter: Payer: Self-pay | Admitting: Obstetrics & Gynecology

## 2017-08-31 ENCOUNTER — Ambulatory Visit (INDEPENDENT_AMBULATORY_CARE_PROVIDER_SITE_OTHER): Payer: Commercial Managed Care - PPO

## 2017-08-31 ENCOUNTER — Ambulatory Visit: Payer: Commercial Managed Care - PPO | Admitting: Obstetrics & Gynecology

## 2017-08-31 VITALS — BP 128/84

## 2017-08-31 DIAGNOSIS — N920 Excessive and frequent menstruation with regular cycle: Secondary | ICD-10-CM

## 2017-08-31 DIAGNOSIS — D219 Benign neoplasm of connective and other soft tissue, unspecified: Secondary | ICD-10-CM

## 2017-08-31 DIAGNOSIS — D251 Intramural leiomyoma of uterus: Secondary | ICD-10-CM | POA: Diagnosis not present

## 2017-08-31 NOTE — Progress Notes (Signed)
    [name] 1971/09/27 [id number]        46 y.o.  G1P0010 Boyfriend x 3 yrs.  Using condoms.  RP:  Uterine Fibroids per Gyn exam for Pelvic US  HPI:  Menses reg every month with long standing heavy flow on first 2 days.  Feels more tires, has less energy on her periods.  No pelvic pain.  No pain with IC.  Past medical history,surgical history, problem list, medications, allergies, family history and social history were all reviewed and documented in the EPIC chart.  Directed ROS with pertinent positives and negatives documented in the history of present illness/assessment and plan.  Exam:  Vitals:   08/31/17 1053  BP: 128/84   General appearance:  Normal  Pelvic US today: T/V and T/A anteverted uterus measuring 10.91 x 8.26 x 6.75 cm.  Intramural fibroids the largest 5 measuring 4.4 cm, 2.4 cm, 2.1 cm, 1.4 cm, 1.3 cm.  Total of 11 fibroids.  Endometrial lining tri-layered, measured at 10.3 mm.  Right ovary normal.  Left ovary normal with a small follicle measuring 20 mm.  No free fluid in the posterior cul-de-sac.  Results for [name] (MRN [id number]) as of 08/31/2017 10:57  Ref. Range 08/03/2017 12:37  WBC Latest Ref Range: 3.8 - 10.8 Thousand/uL 6.8  RBC Latest Ref Range: 3.80 - 5.10 Million/uL 4.52  Hemoglobin Latest Ref Range: 11.7 - 15.5 g/dL 11.9  HCT Latest Ref Range: 35.0 - 45.0 % 36.5  MCV Latest Ref Range: 80.0 - 100.0 fL 80.8  MCH Latest Ref Range: 27.0 - 33.0 pg 26.3 (L)  MCHC Latest Ref Range: 32.0 - 36.0 g/dL 32.6  RDW Latest Ref Range: 11.0 - 15.0 % 12.8  Platelets Latest Ref Range: 140 - 400 Thousand/uL 420 (H)  MPV Latest Ref Range: 7.5 - 12.5 fL 9.4    Assessment/Plan:  46 y.o. G1P0010   1. Intramural leiomyoma of uterus No recent change in her periods.  No pelvic pain.  Last Hb on 08/03/2017 was 11.9.  Confirmed Uterine Fibroids by Pelvic US today.  Overall uterine size just moderately increased and the largest IM Myoma is 4.4 cm.  Given the  mild symptoms and the benign appearance of the US findings, the patient was given the choice to treat or observe. Progestin treatments to control her menstrual flow discussed, including Progestin IUD and Progestin-only Pills.  Decision to observe on Ibuprofen regularly the first 2 days of her periods.  Recommend Iron-rich nutrition, will increase her intake of dark green vegetables.  Precautions given to f/u for reevaluation if develops Pelvic pain or abnormal vaginal bleeding.  Counseling on above issues >50% x 15 minutes.  Princess Bruins MD, 10:55 AM 08/31/2017

## 2017-08-31 NOTE — Patient Instructions (Signed)
1. Intramural leiomyoma of uterus No recent change in her periods.  No pelvic pain.  Last Hb on 08/03/2017 was 11.9.  Confirmed Uterine Fibroids by Pelvic US today.  Overall uterine size just moderately increased and the largest IM Myoma is 4.4 cm.  Given the mild symptoms and the benign appearance of the US findings, the patient was given the choice to treat or observe. Progestin treatments to control her menstrual flow discussed, including Progestin IUD and Progestin-only Pills.  Decision to observe on Ibuprofen regularly the first 2 days of her periods.  Recommend Iron-rich nutrition, will increase her intake of dark green vegetables.  Precautions given to f/u for reevaluation if develops Pelvic pain or abnormal vaginal bleeding.  [name], fue un placer verla de nuevo!  A la proxima!   Fibromas uterinos (Uterine Fibroids) Los fibromas uterinos son masas (tumores) de tejido que pueden desarrollarse en el vientre (tero). Tambin se los [employer]. Este tipo de tumor no es Radio broadcast assistant (benigno) y no se disemina a Airline pilot del cuerpo fuera de la zona plvica, la cual se encuentra entre los huesos de la cadera. En ocasiones, los fibromas pueden crecer en las trompas de [place], en el cuello del tero o en las estructuras de soporte (ligamentos) que rodean el tero. Una mujer puede tener uno o ms fibromas. Los fibromas pueden tener diferente tamao y [place], y crecer en distintas partes del tero. Algunos pueden crecer hasta volverse bastante grandes. La mayora no requiere tratamiento mdico. CAUSAS Un fibroma puede desarrollarse cuando una nica clula uterina contina creciendo (se multiplica). La mayora de las clulas del cuerpo humano tienen un mecanismo de control que impide que se multipliquen sin control. [place] los sntomas se pueden incluir los siguientes:  Hemorragias intensas durante la menstruacin.  Prdidas de sangre o [employer].  Dolor y opresin en la pelvis.  Problemas de la vejiga, como necesidad de Garment/textile technologist con ms frecuencia (polaquiuria) o necesidad imperiosa de Garment/textile technologist.  Incapacidad para reproducir (infertilidad).  Abortos espontneos. DIAGNSTICO Los fibromas uterinos se diagnostican con un examen fsico. El mdico puede palpar los tumores grumosos durante un examen plvico. Pueden realizarse ecografas y Ardelia Mems resonancia magntica para determinar el tamao y la ubicacin de los fibromas, as como la cantidad. TRATAMIENTO El tratamiento puede incluir lo siguiente:  Observacin cautelosa. Esto requiere que el mdico controle el fibroma para saber si crece o se achica. Siga las recomendaciones del mdico respecto de la frecuencia con la que debe realizarse los controles.  Medicamentos hormonales. Pueden tomarse por va oral o administrarse a travs de un dispositivo intrauterino (DIU).  Ciruga. ? Extirpacin de los fibromas (miomectoma) o del tero (histerectoma). ? Suprimir la irrigacin sangunea a los fibromas (embolizacin de la arteria uterina). Si los fibromas le traen problemas de fertilidad y tiene deseos de quedar [place], el mdico puede recomendar su extirpacin. INSTRUCCIONES PARA EL CUIDADO EN EL HOGAR  Concurra a todas las visitas de control como se lo haya indicado el mdico. Esto es importante.  Tome los medicamentos de venta libre y los recetados solamente como se lo haya indicado el mdico. ? Si le recetaron un tratamiento hormonal, tome los medicamentos hormonales exactamente como se lo indicaron.  Consulte al [employer] sobre tomar comprimidos de hierro y Garment/textile technologist la cantidad de verduras de hoja color verde oscuro en la dieta. Estas medidas pueden ayudar a Transport planner de hierro en la [place], que pueden verse afectados por las hemorragias menstruales intensas.  Preste mucha atencin a la menstruacin e informe al mdico si hay algn cambio, por ejemplo: ? Aumento  del flujo de sangre que le exige el uso de ms compresas o tampones que los que utiliza normalmente cada mes. ? Un cambio en la cantidad de [employer] dura la menstruacin cada mes. ? Un cambio en los sntomas asociados con la [place], como clicos abdominales o dolor de espalda. SOLICITE ATENCIN MDICA SI:  Tiene dolor plvico, dolor de espalda o clicos abdominales que los medicamentos no Engineer, petroleum.  Observa un aumento del sangrado entre y [employer].  Empapa los tampones o las compresas en el trmino de media hora o [place].  Se siente mareada, muy cansada o dbil. SOLICITE ATENCIN MDICA DE INMEDIATO SI:  Se desmaya.  El dolor plvico aumenta repentinamente. Esta informacin no tiene Marine scientist el consejo del mdico. Asegrese de hacerle al mdico cualquier pregunta que tenga. Document Released: 09/27/2005 Document Revised: 01/19/2016 Document Reviewed: 03/26/2014 Elsevier Interactive Patient Education  [employer].

## 2017-09-05 ENCOUNTER — Encounter: Payer: Self-pay | Admitting: Obstetrics & Gynecology

## 2017-09-05 ENCOUNTER — Ambulatory Visit: Payer: Commercial Managed Care - PPO | Admitting: Obstetrics & Gynecology

## 2017-09-05 VITALS — BP 142/88

## 2017-09-05 DIAGNOSIS — B977 Papillomavirus as the cause of diseases classified elsewhere: Secondary | ICD-10-CM

## 2017-09-05 DIAGNOSIS — N72 Inflammatory disease of cervix uteri: Secondary | ICD-10-CM | POA: Diagnosis not present

## 2017-09-05 DIAGNOSIS — R87612 Low grade squamous intraepithelial lesion on cytologic smear of cervix (LGSIL): Secondary | ICD-10-CM

## 2017-09-05 NOTE — Patient Instructions (Signed)
1. 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  Colposcopy done today.  Compatible with mild dysplasia, pending cervical biopsy.  Management per cervical biopsy results.  2. High risk human papilloma virus (HPV) infection of cervix  Noe, fue un placer verla de nuevo hoy!  Voy a informarla de Financial trader.  Colposcopa - Cuidados posteriores (Colposcopy, Care After) Siga estas instrucciones durante las prximas semanas. Estas indicaciones le proporcionan informacin general acerca de cmo deber cuidarse despus del procedimiento. El mdico tambin podr darle instrucciones ms especficas. El tratamiento se ha planificado de acuerdo a las prcticas mdicas actuales, pero a veces se producen problemas. Comunquese con el mdico si tiene algn problema o tiene dudas despus del procedimiento. QU ESPERAR DESPUS DEL PROCEDIMIENTO  Despus del procedimiento, es tpico tener las siguientes sensaciones:  Clicos. Generalmente se calman en algunos minutos.  Dolor. [place].  Aturdimiento. Si esto le ocurre, recustese durante algunos minutos. Podr tener un sangrado leve o una secrecin oscura que debe detenerse en [place]. Durante este tiempo deber usar un apsito sanitario. [place] vaginales y el uso de tampones durante 3 das, o segn lo que le indique su mdico.  Tome slo medicamentos de venta libre o recetados, segn las indicaciones del mdico. No tome aspirina, ya que puede causar hemorragias.  Si utiliza pldoras anticonceptivas, contine tomndolas.  No todos los resultados estarn disponibles durante su visita. En este caso, tenga otra entrevista con su mdico para conocerlos. No suponga que es normal si no tiene noticias de su mdico o del establecimiento de salud. Es Building services engineer seguimiento de todos los [place] de [place].  Siga los  consejos de su mdico con respecto a los [place], [place], visitas y [place] de control. SOLICITE ATENCIN MDICA SI:  Aparece una erupcin cutnea.  Tiene problemas con los medicamentos. SOLICITE ATENCIN MDICA DE INMEDIATO SI:  Tiene una hemorragia abundante o elimina cogulos.  Tiene fiebre.  Tiene flujo vaginal anormal.  Tiene clicos que no se alivian luego de tomar analgsicos.  Se siente mareada, tiene vahdos o se desmaya.  Siente Research scientist (life sciences). Esta informacin no tiene Marine scientist el consejo del mdico. Asegrese de hacerle al mdico cualquier pregunta que tenga. Document Released: 07/18/2013 Elsevier Interactive Patient Education  2017 [employer].

## 2017-09-05 NOTE — Addendum Note (Signed)
Addended by: Thurnell Garbe A on: 09/05/2017 12:53 PM   Modules accepted: Orders

## 2017-09-05 NOTE — Progress Notes (Signed)
    [name] January 10, 1971 [id number]        46 y.o.  G1P0010   RP:  LGSIL/HPV HR pos  HPI:  No change x last visit 08/2017.  Past medical history,surgical history, problem list, medications, allergies, family history and social history were all reviewed and documented in the EPIC chart.  Directed ROS with pertinent positives and negatives documented in the history of present illness/assessment and plan.  Exam:  Vitals:   09/05/17 1201  BP: (!) 142/88   General appearance:  Normal  Colposcopy Procedure Note [name] 09/05/2017  Indications:  LGSIL/HPV HR pos  Procedure Details  The risks and benefits of the procedure and Verbal informed consent obtained.  Speculum placed in vagina and excellent visualization of cervix achieved, cervix swabbed x 3 with acetic acid solution.  Findings:  Cervix colposcopy:  Physical Exam  Genitourinary:      Vaginal colposcopy: Normal  Vulvar colposcopy: Grossly normal  Perirectal colposcopy: Grossly normal   Specimens: Cervical Biopsy at 5 O'clock.  Complications:  None, hemostasis with Silver Nitrate. . Plan:  Per pending Cervical Bx.   Assessment/Plan:  46 y.o. G1P0010   1. 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  Colposcopy done today.  Compatible with mild dysplasia, pending cervical biopsy.  Management per cervical biopsy results.  2. High risk human papilloma virus (HPV) infection of cervix   Princess Bruins MD, 12:11 PM 09/05/2017

## 2017-09-07 ENCOUNTER — Ambulatory Visit
Admission: RE | Admit: 2017-09-07 | Discharge: 2017-09-07 | Disposition: A | Discharge status: Home / Self Care | Payer: Commercial Managed Care - PPO | Source: Ambulatory Visit | Attending: Obstetrics & Gynecology | Admitting: Obstetrics & Gynecology

## 2017-09-07 DIAGNOSIS — Z1231 Encounter for screening mammogram for malignant neoplasm of breast: Secondary | ICD-10-CM

## 2017-09-07 LAB — PATHOLOGY

## 2017-09-07 LAB — TISSUE SPECIMEN

## 2018-02-22 ENCOUNTER — Ambulatory Visit: Payer: Commercial Managed Care - PPO | Admitting: Obstetrics & Gynecology

## 2018-02-28 ENCOUNTER — Encounter: Payer: Self-pay | Admitting: Obstetrics & Gynecology

## 2018-02-28 ENCOUNTER — Ambulatory Visit: Payer: Commercial Managed Care - PPO | Admitting: Obstetrics & Gynecology

## 2018-02-28 VITALS — BP 144/88

## 2018-02-28 DIAGNOSIS — B977 Papillomavirus as the cause of diseases classified elsewhere: Secondary | ICD-10-CM

## 2018-02-28 DIAGNOSIS — R87612 Low grade squamous intraepithelial lesion on cytologic smear of cervix (LGSIL): Secondary | ICD-10-CM | POA: Diagnosis not present

## 2018-02-28 DIAGNOSIS — N72 Inflammatory disease of cervix uteri: Secondary | ICD-10-CM

## 2018-02-28 NOTE — Addendum Note (Signed)
Addended by: Thurnell Garbe A on: 02/28/2018 09:37 AM   Modules accepted: Orders

## 2018-02-28 NOTE — Progress Notes (Signed)
    [name] Mar 09, 1971 [id number]        47 y.o.  G1P0010 married for 10 years.  RP: 36-month Pap for history of LGSIL with high risk HPV.  HPI: Had low-grade SIL with high risk HPV positive in October 2018.  Colposcopy done in November 2018 showing no dysplasia.  Per patient, her menses are better with heavy flow only on day 1 rather than the 3 first days.  Menses regular every month.  No pelvic pain.  Normal vaginal secretions.   OB History  Gravida Para Term Preterm AB Living  1 0     1 0  SAB TAB Ectopic Multiple Live Births  1            # Outcome Date GA Lbr Len/2nd Weight Sex Delivery Anes PTL Lv  1 SAB             Past medical history,surgical history, problem list, medications, allergies, family history and social history were all reviewed and documented in the EPIC chart.   Directed ROS with pertinent positives and negatives documented in the history of present illness/assessment and plan.  Exam:  Vitals:   02/28/18 0848  BP: (!) 144/88   General appearance:  Normal   Gynecologic exam: Vulva normal.  Speculum: Cervix normal.  Pap with high-risk HPV done, types 16, 18 and 45.  Vagina normal.  Normal secretions.   Assessment/Plan:  47 y.o. G1P0010   1. LGSIL on Pap smear of cervix Colposcopy 08/2017 with no dysplasia.  Repeat Pap test with HR HPV today.  2. High risk human papilloma virus (HPV) infection of cervix HPV 16-18-45 done today.  Counseling on above issues and coordination of care more than 50% for 15 minutes.  Princess Bruins MD, 9:02 AM 02/28/2018

## 2018-02-28 NOTE — Patient Instructions (Signed)
1. LGSIL on Pap smear of cervix Colposcopy 08/2017 with no dysplasia.  Repeat Pap test with HR HPV today.  2. High risk human papilloma virus (HPV) infection of cervix HPV 16-18-45 done today.  Jasmine Awe, fue un placer verle hoy!  Voy a informarle de sus [employer].

## 2018-03-02 LAB — PAP, TP IMAGING W/ HPV RNA, RFLX HPV TYPE 16,18/45: HPV DNA High Risk: DETECTED — AB

## 2018-04-07 ENCOUNTER — Encounter: Payer: Self-pay | Admitting: Obstetrics & Gynecology

## 2018-04-07 ENCOUNTER — Ambulatory Visit: Payer: Commercial Managed Care - PPO | Admitting: Obstetrics & Gynecology

## 2018-04-07 VITALS — BP 140/90

## 2018-04-07 DIAGNOSIS — R87810 Cervical high risk human papillomavirus (HPV) DNA test positive: Secondary | ICD-10-CM

## 2018-04-07 DIAGNOSIS — R87612 Low grade squamous intraepithelial lesion on cytologic smear of cervix (LGSIL): Secondary | ICD-10-CM

## 2018-04-07 NOTE — Progress Notes (Signed)
    [name] 13-Jan-1971 [id number]        47 y.o.  G1P0010   RP: Persistent LGSIL/HPV HR positive  HPI: Last Pap test on Feb 28, 2018 showing persistent LGSIL with positive high risk HPV.  Last colposcopy November 2018 showed no dysplasia.   OB History  Gravida Para Term Preterm AB Living  1 0     1 0  SAB TAB Ectopic Multiple Live Births  1            # Outcome Date GA Lbr Len/2nd Weight Sex Delivery Anes PTL Lv  1 SAB             Past medical history,surgical history, problem list, medications, allergies, family history and social history were all reviewed and documented in the EPIC chart.   Directed ROS with pertinent positives and negatives documented in the history of present illness/assessment and plan.  Exam:  Vitals:   04/07/18 1149  BP: 140/90   General appearance:  Normal  Colposcopy Procedure Note [name] 04/07/2018  Indications: Persistent LGSIL with positive high risk HPV  Procedure Details  The risks and benefits of the procedure and Verbal informed consent obtained.  Speculum placed in vagina and excellent visualization of cervix achieved, cervix swabbed x 3 with acetic acid solution.  Findings:  Cervix colposcopy:  Physical Exam  Genitourinary:     Vaginal colposcopy: Normal  Vulvar colposcopy:  Grossly normal  Perirectal colposcopy: Grossly normal  The cervix was sprayed with [place] before performing the cervical biopsies.  Specimens:  Cervical Biopsy at 6 O'clock.  Complications: None, good hemostasis with Silver Nitrate . Plan:   Assessment/Plan:  47 y.o. G1P0010   1. 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  Cervical biopsy pending.  Procedure well-tolerated.  Patient informed of the findings.  2. Cervical high risk HPV (human papillomavirus) test positive As above  Counseling on above  issues and coordination of care more than 50% for 10 minutes. Princess Bruins MD, 12:00 PM 04/07/2018

## 2018-04-09 ENCOUNTER — Encounter: Payer: Self-pay | Admitting: Obstetrics & Gynecology

## 2018-04-09 NOTE — Patient Instructions (Signed)
1. 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  Cervical biopsy pending.  Procedure well-tolerated.  Patient informed of the findings.  2. Cervical high risk HPV (human papillomavirus) test positive As above  [name], it was a pleasure seeing you today!  I will inform you of your results as soon as they are available.

## 2018-04-11 LAB — PATHOLOGY

## 2018-04-11 LAB — TISSUE SPECIMEN

## 2018-08-04 ENCOUNTER — Encounter: Payer: Commercial Managed Care - PPO | Admitting: Obstetrics & Gynecology

## 2018-08-10 ENCOUNTER — Encounter: Payer: Commercial Managed Care - PPO | Admitting: Obstetrics & Gynecology

## 2018-10-18 ENCOUNTER — Ambulatory Visit: Payer: Commercial Managed Care - PPO | Admitting: Obstetrics & Gynecology

## 2018-11-06 ENCOUNTER — Encounter: Payer: Self-pay | Admitting: Obstetrics & Gynecology

## 2018-11-06 ENCOUNTER — Ambulatory Visit: Payer: BLUE CROSS/BLUE SHIELD | Admitting: Obstetrics & Gynecology

## 2018-11-06 VITALS — BP 126/80 | Ht 64.0 in | Wt 168.0 lb

## 2018-11-06 DIAGNOSIS — Z1151 Encounter for screening for human papillomavirus (HPV): Secondary | ICD-10-CM

## 2018-11-06 DIAGNOSIS — Z113 Encounter for screening for infections with a predominantly sexual mode of transmission: Secondary | ICD-10-CM

## 2018-11-06 DIAGNOSIS — Z01419 Encounter for gynecological examination (general) (routine) without abnormal findings: Secondary | ICD-10-CM

## 2018-11-06 DIAGNOSIS — Z789 Other specified health status: Secondary | ICD-10-CM

## 2018-11-06 DIAGNOSIS — N870 Mild cervical dysplasia: Secondary | ICD-10-CM

## 2018-11-06 NOTE — Progress Notes (Signed)
[name] 1970/10/12 [id number]   History:    48 y.o. G1P0A1 Boyfriend x 4 yrs  RP:  Established patient presenting for annual gyn exam with repeat Pap test  HPI: H/O LGSIL/HPV HR pos.  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  Menses regular normal every month.  No pelvic pain.  Using condoms.  No pain with IC.  Would like STI screen.  Breasts normal.  Health labs with Fam MD.  Past medical history,surgical history, family history and social history were all reviewed and documented in the EPIC chart.  Gynecologic History Patient's last menstrual period was 11/01/2018. Contraception: Condoms Last Pap: 02/2018. Results were: LGSIL/HPV HR pos Last mammogram: 08/2017. Results were: Negative Bone Density: Never Colonoscopy: Never  Obstetric History OB History  Gravida Para Term Preterm AB Living  1 0     1 0  SAB TAB Ectopic Multiple Live Births  1            # Outcome Date GA Lbr Len/2nd Weight Sex Delivery Anes PTL Lv  1 SAB              ROS: A ROS was performed and pertinent positives and negatives are included in the history.  GENERAL: No fevers or chills. HEENT: No change in vision, no earache, sore throat or sinus congestion. NECK: No pain or stiffness. CARDIOVASCULAR: No chest pain or pressure. No palpitations. PULMONARY: No shortness of breath, cough or wheeze. GASTROINTESTINAL: No abdominal pain, nausea, vomiting or diarrhea, melena or bright red blood per rectum. GENITOURINARY: No urinary frequency, urgency, hesitancy or dysuria. MUSCULOSKELETAL: No joint or muscle pain, no back pain, no recent trauma. DERMATOLOGIC: No rash, no itching, no lesions. ENDOCRINE: No polyuria, polydipsia, no heat or cold intolerance. No recent change in weight. HEMATOLOGICAL: No anemia or easy bruising or bleeding. NEUROLOGIC: No headache, seizures, numbness, tingling or weakness. PSYCHIATRIC: No depression, no loss of interest in normal activity or change in sleep pattern.     Exam:   BP 126/80   Ht 5\' 4"   (1.626 m)   Wt 168 lb (76.2 kg)   LMP 11/01/2018   BMI 28.84 kg/m   Body mass index is 28.84 kg/m.  General appearance : Well developed well nourished female. No acute distress HEENT: Eyes: no retinal hemorrhage or exudates,  Neck supple, trachea midline, no carotid bruits, no thyroidmegaly Lungs: Clear to auscultation, no rhonchi or wheezes, or rib retractions  Heart: Regular rate and rhythm, no murmurs or gallops Breast:Examined in sitting and supine position were symmetrical in appearance, no palpable masses or tenderness,  no skin retraction, no nipple inversion, no nipple discharge, no skin discoloration, no axillary or supraclavicular lymphadenopathy Abdomen: no palpable masses or tenderness, no rebound or guarding Extremities: no edema or skin discoloration or tenderness  Pelvic: Vulva: Normal             Vagina: No gross lesions or discharge  Cervix: No gross lesions or discharge.  Pap/HPV HR done  Uterus  AV, normal size, shape and consistency, non-tender and mobile  Adnexa  Without masses or tenderness  Anus: Normal   Assessment/Plan:  48 y.o. female for annual exam   1. Encounter for gynecological examination with Papanicolaou smear of cervix Normal gynecologic exam.  Pap with high-risk HPV done today.  Breast exam normal.  Will schedule screening mammogram.  Health labs with family physician.  Body mass index 28.84.  Lower calorie/carb diet with aerobic activities 5 times a week and weightlifting every 2 days  recommended. - Pap IG, CT/NG NAA, and HPV (high risk)  2. Use of condoms for contraception  3. Screen for STD (sexually transmitted disease) - HIV antibody (with reflex) - RPR - Hepatitis C Antibody - Hepatitis B Surface AntiGEN - Pap IG, CT/NG NAA, and HPV (high risk)  4. Dysplasia of cervix, low grade (CIN 1) Repeat Pap test with high-risk HPV done today.  5. Special screening examination for human papillomavirus (HPV) - Pap IG, CT/NG NAA, and HPV (high  risk)  Princess Bruins MD, 4:34 PM 11/06/2018

## 2018-11-07 LAB — HIV ANTIBODY (ROUTINE TESTING W REFLEX): HIV: NONREACTIVE

## 2018-11-07 LAB — HEPATITIS C ANTIBODY
Hepatitis C Ab: NONREACTIVE
SIGNAL TO CUT-OFF: 0.01 (ref <1.00)

## 2018-11-07 LAB — HEPATITIS B SURFACE ANTIGEN: Hepatitis B Surface Ag: NONREACTIVE

## 2018-11-07 LAB — RPR: RPR Ser Ql: NONREACTIVE

## 2018-11-08 LAB — PAP IG, CT-NG NAA, HPV HIGH-RISK
C. TRACHOMATIS RNA, TMA: NOT DETECTED
HPV DNA HIGH RISK: NOT DETECTED
N. gonorrhoeae RNA, TMA: NOT DETECTED

## 2018-11-12 ENCOUNTER — Encounter: Payer: Self-pay | Admitting: Obstetrics & Gynecology

## 2018-11-12 NOTE — Patient Instructions (Signed)
1. Encounter for gynecological examination with Papanicolaou smear of cervix Normal gynecologic exam.  Pap with high-risk HPV done today.  Breast exam normal.  Will schedule screening mammogram.  Health labs with family physician.  Body mass index 28.84.  Lower calorie/carb diet with aerobic activities 5 times a week and weightlifting every 2 days recommended. - Pap IG, CT/NG NAA, and HPV (high risk)  2. Use of condoms for contraception  3. Screen for STD (sexually transmitted disease) - HIV antibody (with reflex) - RPR - Hepatitis C Antibody - Hepatitis B Surface AntiGEN - Pap IG, CT/NG NAA, and HPV (high risk)  4. Dysplasia of cervix, low grade (CIN 1) Repeat Pap test with high-risk HPV done today.  5. Special screening examination for human papillomavirus (HPV) - Pap IG, CT/NG NAA, and HPV (high risk)  [name], it was a pleasure seeing you today!  I will inform you of your results as soon as they are available.

## 2018-11-15 ENCOUNTER — Other Ambulatory Visit: Payer: Self-pay | Admitting: Obstetrics & Gynecology

## 2018-11-15 DIAGNOSIS — Z1231 Encounter for screening mammogram for malignant neoplasm of breast: Secondary | ICD-10-CM

## 2018-12-11 ENCOUNTER — Ambulatory Visit
Admission: RE | Admit: 2018-12-11 | Discharge: 2018-12-11 | Disposition: A | Discharge status: Home / Self Care | Payer: Commercial Managed Care - PPO | Source: Ambulatory Visit | Attending: Obstetrics & Gynecology | Admitting: Obstetrics & Gynecology

## 2018-12-11 DIAGNOSIS — Z1231 Encounter for screening mammogram for malignant neoplasm of breast: Secondary | ICD-10-CM

## 2019-07-10 NOTE — Progress Notes (Addendum)
[facility] Note  07/11/2019     CHIEF COMPLAINT Patient presents for Retina Evaluation   HISTORY OF PRESENT ILLNESS: [name] is a 48 y.o. female who presents to the clinic today for:   HPI    Retina Evaluation    In right eye.  This started 9 months ago.  Duration of 9 months.  I, the attending physician,  performed the HPI with the patient and updated documentation appropriately.          Comments    New patient retina eval Patient states her vision has been poor in her right eye since December 2019.  Patient describes a lot of light sensitivity in her right eye as well.  Patient denies eye pain and denies any new or worsening floaters or fol OU.       Last edited by [name], MD on 07/11/2019  5:34 PM. (History)    pt is with an interpreter today; pt states she had an eye exam 18 months ago and her vision was okay, she states when she saw Dr. Jerline Pain this past Wednesday so noticed a problem with her right eye vision, pt states she started noticing her vision decreasing in December/January while she was at work, she states she had to lean to the left to fill out paper work, she states she has noticed a film develops over her right eye also, she denies having any medical problems, she denies being hypertensive, she denies steroid use, she uses OTC nasal spray for allergies, pt states she has been under a lot of stress lately bc she has been out of work and is having immigration issues with her sister in [place], pt states she is sensitive to light in her right eye, she states even light from the TV bothers her, she tries to keep the lights off at home,  Referring physician: Shawnie Dapper, DO [address] [place],  [place] [address]  HISTORICAL INFORMATION:   Selected notes from the MEDICAL RECORD NUMBER Referred by [name] for concern of sub-macular fluid / retinoschisis LEE: 09.25.20 [name]) [BCVA: 20/80 OS: 20/25--]   Ocular Hx-cataracts, pterygium, retinoschisis, exu ARMD PMH-HTN   CURRENT MEDICATIONS: No current outpatient medications on file. (Ophthalmic Drugs)   No current facility-administered medications for this visit.  (Ophthalmic Drugs)   Current Outpatient Medications (Other)  Medication Sig  . eplerenone (INSPRA) 50 MG tablet Take 1 tablet (50 mg total) by mouth daily.  . hydrochlorothiazide (HYDRODIURIL) 25 MG tablet TAKE 1 TABLET BY MOUTH ONCE DAILY IN THE MORNING FOR 90 DAYS  . lisinopril (PRINIVIL,ZESTRIL) 20 MG tablet Take 1 tablet (20 mg total) by mouth daily.   No current facility-administered medications for this visit.  (Other)      REVIEW OF SYSTEMS: ROS    Positive for: Eyes   Negative for: Constitutional, Gastrointestinal, Neurological, Skin, Genitourinary, Musculoskeletal, HENT, Endocrine, Cardiovascular, Respiratory, Psychiatric, Allergic/Imm, Heme/Lymph   Last edited by [name] on 07/11/2019 12:55 PM. (History)       ALLERGIES No Known Allergies  PAST MEDICAL HISTORY Past Medical History:  Diagnosis Date  . CIN I (cervical intraepithelial neoplasia I)   . High risk HPV infection   . Hypertension   . Missed ab AGUST 6 2014  . Sciatic nerve injury    Past Surgical History:  Procedure Laterality Date  . MYOMECTOMY  2008    FAMILY HISTORY Family History  Problem Relation Age of  Onset  . Hypertension Mother   . Diabetes Father   . Diabetes Maternal Grandmother   . Hypertension Maternal Grandmother     SOCIAL HISTORY Social History   Tobacco Use  . Smoking status: Never Smoker  . Smokeless tobacco: Never Used  Substance Use Topics  . Alcohol use: No    Alcohol/week: 0.0 standard drinks  . Drug use: No         OPHTHALMIC EXAM:  Base Eye Exam    Visual Acuity (Snellen - Linear)      Right Left   Dist cc 20/80 -2 20/30 +1   Dist ph cc NI 20/25 +2   Correction: Glasses       Pupils      Dark Light Shape React APD   Right 4  3 Round Brisk 0   Left 4 3 Round Brisk 0       Visual Fields      Left Right    Full Full       Extraocular Movement      Right Left    Full Full       Neuro/Psych    Oriented x3: Yes   Mood/Affect: Normal        Slit Lamp and Fundus Exam    Slit Lamp Exam      Right Left   Lids/Lashes Dermatochalasis - upper lid Dermatochalasis - upper lid   Conjunctiva/Sclera Injection, nasal pterygium 1+ Injection, nasal Pinguecula   Cornea Nasal ptergyium extending 2.19mm onto nasal cornea Punctate epithelial erosions   Anterior Chamber deep, narrow temporal angle, 2-3+ Cell / pigment deep, narrow temporal angle, 1+pigment   Iris Round and dilated Round and dilated   Lens 2+ Nuclear sclerosis, 2+ Cortical cataract 2+ Nuclear sclerosis, 2+ Cortical cataract   Vitreous Mild Vitreous syneresis Mild Vitreous syneresis       Fundus Exam      Right Left   Disc Pink and Sharp, +cupping Pink and Sharp, +cupping   C/D Ratio 0.65 0.6   Macula Blunted foveal reflex, +shallow SRF, RPE mottling, clumping and atrophy Flat, Blunted foveal reflex, Epiretinal membrane, No heme or edema   Vessels Vascular attenuation, Tortuous Vascular attenuation, mild Tortuousity   Periphery Attached, bullous Retinoschisis cavity from 0700-0830 -- no associated RT/RD Attached, shallow retinoschisis cavity peripheral IT quadrant         Refraction    Wearing Rx      Sphere Cylinder Axis   Right +1.25 +1.25 112   Left +2.25 +1.00 101       Manifest Refraction      Sphere Cylinder Axis Dist VA   Right +1.50 +1.25 120 20/70-2   Left +2.25 +1.25 105 20/25+1          IMAGING AND PROCEDURES  Imaging and Procedures for @TODAY @  OCT, Retina - OU - Both Eyes       Right Eye Quality was good. Central Foveal Thickness: 231. Progression has no prior data. Findings include normal foveal contour, no IRF, subretinal fluid, outer retinal atrophy, vitreomacular adhesion  (IT retinoschisis cavity caught on  widefield).   Left Eye Quality was good. Central Foveal Thickness: 289. Progression has no prior data. Findings include normal foveal contour, no IRF, no SRF, vitreomacular adhesion .   Notes *Images captured and stored on drive  Diagnosis / Impression:  OD: central ORA and SRF; bullous retinoschisis IT periphery caught on widefield OS: NFP, no IRF/SRF  Clinical management:  See below  Abbreviations: NFP - Normal foveal profile. CME - cystoid macular edema. PED - pigment epithelial detachment. IRF - intraretinal fluid. SRF - subretinal fluid. EZ - ellipsoid zone. ERM - epiretinal membrane. ORA - outer retinal atrophy. ORT - outer retinal tubulation. SRHM - subretinal hyper-reflective material        Fluorescein Angiography Optos (Transit OD)       Right Eye   Progression has no prior data. Early phase findings include staining. Mid/Late phase findings include staining, leakage.   Left Eye   Progression has no prior data. Early phase findings include staining. Mid/Late phase findings include staining, leakage.   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 Exudative age-related macular degeneration of right eye with active choroidal neovascularization ([place])  H35.3211   3. 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 Hypertensive retinopathy of both eyes  H35.033 Fluorescein Angiography Optos (Transit OD)  6. Essential hypertension  I10   7. 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 CSCR OD -- possibly chronic  - pt reports decreased vision OD since December  - reports significant stressors in life  - denies steroid use  - BCVA  20/80 OD  - OCT shows shallow SRF and ORA centrally  - FA shows focal hyperfluorescent leakage points nasal macula OD -- expansile dot phenotype?  - differential includes atypical exudative ARMD  - discussed findings, prognosis, and treatment options including observation, po eplerenone, and intravitreal anti-VEGF  - pt wishes to trial po eplerenone -- will start 50 mg daily  - f/u 4 weeks -- DFE/OCT  4. Retinoschisis OU (OD > OS)  - bullous retinoschisis cavity IT quadrant OD-- [id number] oclock  - shallow retinoschisis cavity IT quadrant OS  - no associated retinal holes/RT/RD on scleral depression  - discussed findings and prognosis  - recommend observation -- consider laser retinopexy if schisis progresses or threatens macula or develops RD  5,6. Hypertensive retinopathy OU  - discussed importance of tight BP control  - monitor  7. 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  - discussed diagnosis and progression  - not yet visually significant  - monitor for now   Ophthalmic Meds Ordered this visit:  Meds ordered this encounter  Medications  . eplerenone (INSPRA) 50 MG tablet    Sig: Take 1 tablet (50 mg total) by mouth daily.    Dispense:  30 tablet    Refill:  3       Return in about 4 weeks (around 08/08/2019) for f/u 4 weeks, CSR OD, DFE, OCT.  There are no Patient Instructions on file for this visit.   Explained the diagnoses, plan, and follow up with the patient and they expressed understanding.  Patient expressed understanding of the importance of proper follow up care.   This document serves as a record of services personally performed by Gardiner Sleeper, MD, PhD. It was created on their behalf by Ernest Mallick, OA, an ophthalmic assistant.  The creation of this record is the provider's dictation and/or activities during the visit.    Electronically signed by: Ernest Mallick, OA  09.29.2020 10:51 PM    Gardiner Sleeper, M.D., Ph.D. Diseases & Surgery of the Retina and Vitreous [facility]  I have reviewed the above documentation for accuracy and completeness, and I agree with the above. Gardiner Sleeper, M.D., Ph.D. 07/11/19 10:51 PM    Abbreviations: M myopia (nearsighted); A astigmatism; H hyperopia (farsighted); P presbyopia; Mrx spectacle prescription;  CTL contact lenses; OD right eye; OS left eye; OU both eyes  XT exotropia; ET esotropia; PEK punctate epithelial keratitis; PEE punctate epithelial erosions; DES dry eye syndrome; MGD meibomian gland dysfunction; ATs artificial tears; PFAT's preservative free artificial tears; [place] nuclear sclerotic cataract; PSC posterior subcapsular cataract; ERM epi-retinal membrane; PVD posterior vitreous detachment; RD retinal detachment; DM diabetes mellitus; DR diabetic retinopathy; NPDR non-proliferative diabetic retinopathy; PDR proliferative diabetic retinopathy; CSME clinically significant macular edema; DME diabetic macular edema; dbh dot blot hemorrhages; CWS cotton wool spot; POAG primary open angle glaucoma; C/D cup-to-disc ratio; HVF humphrey visual field; GVF goldmann visual field; OCT optical coherence tomography; IOP intraocular pressure; BRVO Branch retinal vein occlusion; CRVO central retinal vein occlusion; CRAO central retinal artery occlusion; BRAO branch retinal artery occlusion; RT retinal tear; SB scleral buckle; PPV pars plana vitrectomy; VH Vitreous hemorrhage; PRP panretinal laser photocoagulation; IVK intravitreal kenalog; VMT vitreomacular traction; MH Macular hole;  NVD neovascularization of the disc; NVE neovascularization elsewhere; AREDS age related eye disease study; ARMD age related macular degeneration; POAG primary open angle glaucoma; EBMD epithelial/anterior basement membrane dystrophy; ACIOL anterior chamber intraocular lens; IOL intraocular lens; PCIOL posterior chamber  intraocular lens; Phaco/IOL phacoemulsification with intraocular lens placement; [place] photorefractive keratectomy; LASIK laser assisted in situ keratomileusis; HTN hypertension; DM diabetes mellitus; COPD chronic obstructive pulmonary disease

## 2019-07-11 ENCOUNTER — Other Ambulatory Visit: Payer: Self-pay

## 2019-07-11 ENCOUNTER — Ambulatory Visit (INDEPENDENT_AMBULATORY_CARE_PROVIDER_SITE_OTHER): Payer: Commercial Managed Care - PPO | Admitting: Ophthalmology

## 2019-07-11 ENCOUNTER — Encounter (INDEPENDENT_AMBULATORY_CARE_PROVIDER_SITE_OTHER): Payer: Self-pay | Admitting: Ophthalmology

## 2019-07-11 DIAGNOSIS — I10 Essential (primary) hypertension: Secondary | ICD-10-CM

## 2019-07-11 DIAGNOSIS — H353211 Exudative age-related macular degeneration, right eye, with active choroidal neovascularization: Secondary | ICD-10-CM

## 2019-07-11 DIAGNOSIS — H35033 Hypertensive retinopathy, bilateral: Secondary | ICD-10-CM | POA: Diagnosis not present

## 2019-07-11 DIAGNOSIS — H35711 Central serous chorioretinopathy, right eye: Secondary | ICD-10-CM | POA: Diagnosis not present

## 2019-07-11 DIAGNOSIS — H3581 Retinal edema: Secondary | ICD-10-CM

## 2019-07-11 DIAGNOSIS — H25813 Combined forms of age-related cataract, bilateral: Secondary | ICD-10-CM

## 2019-07-11 DIAGNOSIS — H33103 Unspecified retinoschisis, bilateral: Secondary | ICD-10-CM

## 2019-07-11 MED ORDER — EPLERENONE 50 MG PO TABS: 50.0000 mg | ORAL_TABLET | Freq: Every day | ORAL | 3 refills | Status: DC

## 2019-07-11 NOTE — Progress Notes (Signed)
[facility] Note  07/11/2019     CHIEF COMPLAINT Patient presents for Retina Evaluation   HISTORY OF PRESENT ILLNESS: [name] is a 48 y.o. female who presents to the clinic today for:   HPI    Retina Evaluation    In right eye.  This started 9 months ago.  Duration of 9 months.  I, the attending physician,  performed the HPI with the patient and updated documentation appropriately.          Comments    New patient retina eval Patient states her vision has been poor in her right eye since December 2019.  Patient describes a lot of light sensitivity in her right eye as well.  Patient denies eye Sullivan and denies any new or worsening floaters or fol OU.       Last edited by Connie Caffey, MD on 07/11/2019  5:34 PM. (History)    pt is with an interpreter today; pt states she had an eye exam 18 months ago and her vision was okay, she states when she saw Dr. Jerline Sullivan this past Wednesday so noticed a problem with her right eye vision, pt states she started noticing her vision decreasing in December/January while she was at work, she states she had to lean to the left to fill out paper work, she states she has noticed a film develops over her right eye also, she denies having any medical problems, she denies being hypertensive, she denies steroid use, she uses OTC nasal spray for allergies, pt states she has been under a lot of stress lately bc she has been out of work and is having immigration issues with her sister in [place], pt states she is sensitive to light in her right eye, she states even light from the TV bothers her, she tries to keep the lights off at home,  Referring physician: Shawnie Dapper, DO [address] [place],  [place] [address]  HISTORICAL INFORMATION:   Selected notes from the MEDICAL RECORD NUMBER Referred by Dr. Sandre Sullivan for concern of sub-macular fluid / retinoschisis LEE: 09.25.20 [name]) [BCVA: 20/80 OS: 20/25--]   Ocular Hx-cataracts, pterygium, retinoschisis, exu ARMD PMH-HTN   CURRENT MEDICATIONS: No current outpatient medications on file. (Ophthalmic Drugs)   No current facility-administered medications for this visit.  (Ophthalmic Drugs)   Current Outpatient Medications (Other)  Medication Sig  . eplerenone (INSPRA) 50 MG tablet Take 1 tablet (50 mg total) by mouth daily.  . hydrochlorothiazide (HYDRODIURIL) 25 MG tablet TAKE 1 TABLET BY MOUTH ONCE DAILY IN THE MORNING FOR 90 DAYS  . lisinopril (PRINIVIL,ZESTRIL) 20 MG tablet Take 1 tablet (20 mg total) by mouth daily.   No current facility-administered medications for this visit.  (Other)      REVIEW OF SYSTEMS: ROS    Positive for: Eyes   Negative for: Constitutional, Gastrointestinal, Neurological, Skin, Genitourinary, Musculoskeletal, HENT, Endocrine, Cardiovascular, Respiratory, Psychiatric, Allergic/Imm, Heme/Lymph   Last edited by [name] on 07/11/2019 12:55 PM. (History)       ALLERGIES No Known Allergies  PAST MEDICAL HISTORY Past Medical History:  Diagnosis Date  . CIN I (cervical intraepithelial neoplasia I)   . High risk HPV infection   . Hypertension   . Missed ab [name] 6 2014  . Sciatic nerve injury    Past Surgical History:  Procedure Laterality Date  . MYOMECTOMY  2008    FAMILY HISTORY Family History  Problem Relation Age of  Onset  . Hypertension Mother   . Diabetes Father   . Diabetes Maternal Grandmother   . Hypertension Maternal Grandmother     SOCIAL HISTORY Social History   Tobacco Use  . Smoking status: Never Smoker  . Smokeless tobacco: Never Used  Substance Use Topics  . Alcohol use: No    Alcohol/week: 0.0 standard drinks  . Drug use: No         OPHTHALMIC EXAM:  Base Eye Exam    Visual Acuity (Snellen - Linear)      Right Left   Dist cc 20/80 -2 20/30 +1   Dist ph cc NI 20/25 +2   Correction: Glasses       Pupils      Dark Light Shape React APD   Right 4  3 Round Brisk 0   Left 4 3 Round Brisk 0       Visual Fields      Left Right    Full Full       Extraocular Movement      Right Left    Full Full       Neuro/Psych    Oriented x3: Yes   Mood/Affect: Normal        Slit Lamp and Fundus Exam    Slit Lamp Exam      Right Left   Lids/Lashes Dermatochalasis - upper lid Dermatochalasis - upper lid   Conjunctiva/Sclera Injection, nasal pterygium 1+ Injection, nasal Pinguecula   Cornea Nasal ptergyium extending 2.89mm onto nasal cornea Punctate epithelial erosions   Anterior Chamber deep, narrow temporal angle, 2-3+ Cell / pigment deep, narrow temporal angle, 1+pigment   Iris Round and dilated Round and dilated   Lens 2+ Nuclear sclerosis, 2+ Cortical cataract 2+ Nuclear sclerosis, 2+ Cortical cataract   Vitreous Mild Vitreous syneresis Mild Vitreous syneresis       Fundus Exam      Right Left   Disc Pink and Sharp, +cupping Pink and Sharp, +cupping   C/D Ratio 0.65 0.6   Macula Blunted foveal reflex, +shallow SRF, RPE mottling, clumping and atrophy Flat, Blunted foveal reflex, Epiretinal membrane, No heme or edema   Vessels Vascular attenuation, Tortuous Vascular attenuation, mild Tortuousity   Periphery Attached, bullous Retinoschisis cavity from 0700-0830 -- no associated RT/RD Attached, shallow retinoschisis cavity peripheral IT quadrant         Refraction    Wearing Rx      Sphere Cylinder Axis   Right +1.25 +1.25 112   Left +2.25 +1.00 101       Manifest Refraction      Sphere Cylinder Axis Dist VA   Right +1.50 +1.25 120 20/70-2   Left +2.25 +1.25 105 20/25+1          IMAGING AND PROCEDURES  Imaging and Procedures for @TODAY @  OCT, Retina - OU - Both Eyes       Right Eye Quality was good. Central Foveal Thickness: 231. Progression has no prior data. Findings include normal foveal contour, no IRF, subretinal fluid, outer retinal atrophy, vitreomacular adhesion  (IT retinoschisis cavity caught on  widefield).   Left Eye Quality was good. Central Foveal Thickness: 289. Progression has no prior data. Findings include normal foveal contour, no IRF, no SRF, vitreomacular adhesion .   Notes *Images captured and stored on drive  Diagnosis / Impression:  OD: central ORA and SRF; bullous retinoschisis IT periphery caught on widefield OS: NFP, no IRF/SRF  Clinical management:  See below  Abbreviations: NFP - Normal foveal profile. CME - cystoid macular edema. PED - pigment epithelial detachment. IRF - intraretinal fluid. SRF - subretinal fluid. EZ - ellipsoid zone. ERM - epiretinal membrane. ORA - outer retinal atrophy. ORT - outer retinal tubulation. SRHM - subretinal hyper-reflective material        Fluorescein Angiography Optos (Transit OD)       Right Eye   Progression has no prior data. Early phase findings include staining. Mid/Late phase findings include staining, leakage.   Left Eye   Progression has no prior data. Early phase findings include staining. Mid/Late phase findings include staining, leakage.   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 Exudative age-related macular degeneration of right eye with active choroidal neovascularization ([place])  H35.3211   3. 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 Hypertensive retinopathy of both eyes  H35.033 Fluorescein Angiography Optos (Transit OD)  6. Essential hypertension  I10   7. 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 CSCR OD -- possibly chronic  - pt reports decreased vision OD since December  - reports significant stressors in life  - denies steroid use  - BCVA  20/80 OD  - OCT shows shallow SRF and ORA centrally  - FA shows focal hyperfluorescent leakage points nasal macula OD -- expansile dot phenotype?  - differential includes atypical exudative ARMD  - discussed findings, prognosis, and treatment options including observation, po eplerenone, and intravitreal anti-VEGF  - pt wishes to trial po eplerenone -- will start 50 mg daily  - f/u 4 weeks -- DFE/OCT  4. Retinoschisis OU (OD > OS)  - bullous retinoschisis cavity IT quadrant OD-- [id number] oclock  - shallow retinoschisis cavity IT quadrant OS  - no associated retinal holes/RT/RD on scleral depression  - discussed findings and prognosis  - recommend observation -- consider laser retinopexy if schisis progresses or threatens macula or develops RD  5,6. Hypertensive retinopathy OU  - discussed importance of tight BP control  - monitor  7. 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  - discussed diagnosis and progression  - not yet visually significant  - monitor for now   Ophthalmic Meds Ordered this visit:  Meds ordered this encounter  Medications  . eplerenone (INSPRA) 50 MG tablet    Sig: Take 1 tablet (50 mg total) by mouth daily.    Dispense:  30 tablet    Refill:  3       Return in about 4 weeks (around 08/08/2019) for f/u 4 weeks, CSR OD, DFE, OCT.  There are no Patient Instructions on file for this visit.   Explained the diagnoses, plan, and follow up with the patient and they expressed understanding.  Patient expressed understanding of the importance of proper follow up care.   This document serves as a record of services personally performed by Gardiner Sleeper, MD, PhD. It was created on their behalf by Ernest Mallick, OA, an ophthalmic assistant.  The creation of this record is the provider's dictation and/or activities during the visit.    Electronically signed by: Ernest Mallick, OA  09.29.2020 10:54 PM    Gardiner Sleeper, M.D., Ph.D. Diseases & Surgery of the Retina and Vitreous [facility]  I have reviewed the above documentation for accuracy and completeness, and I agree with the above. Gardiner Sleeper, M.D., Ph.D. 07/11/19 10:54 PM    Abbreviations: M myopia (nearsighted); A astigmatism; H hyperopia (farsighted); P presbyopia; Mrx spectacle prescription;  CTL contact lenses; OD right eye; OS left eye; OU both eyes  XT exotropia; ET esotropia; PEK punctate epithelial keratitis; PEE punctate epithelial erosions; DES dry eye syndrome; MGD meibomian gland dysfunction; ATs artificial tears; PFAT's preservative free artificial tears; [place] nuclear sclerotic cataract; PSC posterior subcapsular cataract; ERM epi-retinal membrane; PVD posterior vitreous detachment; RD retinal detachment; DM diabetes mellitus; DR diabetic retinopathy; NPDR non-proliferative diabetic retinopathy; PDR proliferative diabetic retinopathy; CSME clinically significant macular edema; DME diabetic macular edema; dbh dot blot hemorrhages; CWS cotton wool spot; POAG primary open angle glaucoma; C/D cup-to-disc ratio; HVF humphrey visual field; GVF goldmann visual field; OCT optical coherence tomography; IOP intraocular pressure; BRVO Branch retinal vein occlusion; CRVO central retinal vein occlusion; CRAO central retinal artery occlusion; BRAO branch retinal artery occlusion; RT retinal tear; SB scleral buckle; PPV pars plana vitrectomy; VH Vitreous hemorrhage; PRP panretinal laser photocoagulation; IVK intravitreal kenalog; VMT vitreomacular traction; MH Macular hole;  NVD neovascularization of the disc; NVE neovascularization elsewhere; AREDS age related eye disease study; ARMD age related macular degeneration; POAG primary open angle glaucoma; EBMD epithelial/anterior basement membrane dystrophy; ACIOL anterior chamber intraocular lens; IOL intraocular lens; PCIOL posterior chamber  intraocular lens; Phaco/IOL phacoemulsification with intraocular lens placement; [place] photorefractive keratectomy; LASIK laser assisted in situ keratomileusis; HTN hypertension; DM diabetes mellitus; COPD chronic obstructive pulmonary disease

## 2019-08-07 NOTE — Progress Notes (Signed)
[facility] Note  08/08/2019     CHIEF COMPLAINT Patient presents for Retina Follow Up   HISTORY OF PRESENT ILLNESS: [name] is a 48 y.o. female who presents to the clinic today for:   HPI    Retina Follow Up    Patient presents with  Other.  In right eye.  This started 4 weeks ago.  Severity is moderate.  I, the attending physician,  performed the HPI with the patient and updated documentation appropriately.          Comments    Patient here for 4 weeks retina follow up for CSR OD. Patient states vision doing good. No eye pain.        Last edited by [name], MD on 08/08/2019  2:21 PM. (History)    pt is with an interpreter today; she states she is still taking po eplerenone 50mg , she feels like her vision has gotten better   Referring physician: Shawnie Dapper, DO [address] [place],  [place] [address]  HISTORICAL INFORMATION:   Selected notes from the MEDICAL RECORD NUMBER Referred by [name] for concern of sub-macular fluid / retinoschisis LEE: 09.25.20 [name]) [BCVA: 20/80 OS: 20/25--]  Ocular Hx-cataracts, pterygium, retinoschisis, exu ARMD PMH-HTN   CURRENT MEDICATIONS: No current outpatient medications on file. (Ophthalmic Drugs)   No current facility-administered medications for this visit.  (Ophthalmic Drugs)   Current Outpatient Medications (Other)  Medication Sig  . eplerenone (INSPRA) 50 MG tablet Take 1 tablet (50 mg total) by mouth daily.  . hydrochlorothiazide (HYDRODIURIL) 25 MG tablet TAKE 1 TABLET BY MOUTH ONCE DAILY IN THE MORNING FOR 90 DAYS  . lisinopril (PRINIVIL,ZESTRIL) 20 MG tablet Take 1 tablet (20 mg total) by mouth daily.   No current facility-administered medications for this visit.  (Other)      REVIEW OF SYSTEMS: ROS    Positive for: Eyes   Negative for: Constitutional, Gastrointestinal, Neurological, Skin, Genitourinary, Musculoskeletal, HENT, Endocrine, Cardiovascular,  Respiratory, Psychiatric, Allergic/Imm, Heme/Lymph   Last edited by [name], COA on 08/08/2019  1:12 PM. (History)       ALLERGIES No Known Allergies  PAST MEDICAL HISTORY Past Medical History:  Diagnosis Date  . CIN I (cervical intraepithelial neoplasia I)   . High risk HPV infection   . Hypertension   . Missed ab AGUST 6 2014  . Sciatic nerve injury    Past Surgical History:  Procedure Laterality Date  . MYOMECTOMY  2008    FAMILY HISTORY Family History  Problem Relation Age of Onset  . Hypertension Mother   . Diabetes Father   . Diabetes Maternal Grandmother   . Hypertension Maternal Grandmother     SOCIAL HISTORY Social History   Tobacco Use  . Smoking status: Never Smoker  . Smokeless tobacco: Never Used  Substance Use Topics  . Alcohol use: No    Alcohol/week: 0.0 standard drinks  . Drug use: No         OPHTHALMIC EXAM:  Base Eye Exam    Visual Acuity (Snellen - Linear)      Right Left   Dist cc 20/80 -2 20/25 -2   Dist ph cc 20/70 -2    Correction: Glasses       Tonometry (Tonopen, 1:10 PM)      Right Left   Pressure 19 15       Pupils      Dark Light Shape React  APD   Right 4 3 Round Brisk None   Left 4 3 Round Brisk None       Visual Fields (Counting fingers)      Left Right    Full Full       Extraocular Movement      Right Left    Full, Ortho Full, Ortho       Neuro/Psych    Oriented x3: Yes   Mood/Affect: Normal       Dilation    Both eyes: 1.0% Mydriacyl, 2.5% Phenylephrine @ 1:10 PM        Slit Lamp and Fundus Exam    Slit Lamp Exam      Right Left   Lids/Lashes Dermatochalasis - upper lid Dermatochalasis - upper lid   Conjunctiva/Sclera Injection, nasal pterygium 1+ Injection, nasal Pinguecula   Cornea Nasal ptergyium extending 2.61mm onto nasal cornea Punctate epithelial erosions   Anterior Chamber Deep, clear, narrow temporal angle deep, narrow temporal angle, 1+pigment   Iris Round and dilated  Round and dilated   Lens 2+ Nuclear sclerosis, 2+ Cortical cataract 2+ Nuclear sclerosis, 2+ Cortical cataract   Vitreous Mild Vitreous syneresis Mild Vitreous syneresis       Fundus Exam      Right Left   Disc Pink and Sharp, +cupping Pink and Sharp, +cupping   C/D Ratio 0.65 0.6   Macula Blunted foveal reflex, +shallow SRF - improved, RPE mottling, clumping and atrophy Flat, good foveal reflex, Epiretinal membrane, No heme or edema   Vessels Vascular attenuation, Tortuous Vascular attenuation, mild Tortuousity   Periphery Attached, bullous Retinoschisis cavity from 0700-0830 -- no associated RT/RD - stable from prior Attached, shallow retinoschisis cavity peripheral IT quadrant -- stable from prior        Refraction    Wearing Rx      Sphere Cylinder Axis   Right +1.25 +1.25 112   Left +2.25 +1.00 101          IMAGING AND PROCEDURES  Imaging and Procedures for @TODAY @  OCT, Retina - OU - Both Eyes       Right Eye Quality was good. Central Foveal Thickness: 212. Progression has improved. Findings include normal foveal contour, no IRF, subretinal fluid, outer retinal atrophy, vitreomacular adhesion  (IT retinoschisis cavity caught on widefield -- stable; interval improvement in SRF, now just focal patch of SRF nasal to fovea).   Left Eye Quality was good. Central Foveal Thickness: 287. Progression has been stable. Findings include normal foveal contour, no IRF, no SRF, vitreomacular adhesion , outer retinal atrophy.   Notes *Images captured and stored on drive  Diagnosis / Impression:  OD: central patchy ORA and SRF improved--now just focal patch of SRF nasal to fovea; bullous IT retinoschisis cavity caught on widefield OS: NFP, no IRF/SRF  Clinical management:  See below  Abbreviations: NFP - Normal foveal profile. CME - cystoid macular edema. PED - pigment epithelial detachment. IRF - intraretinal fluid. SRF - subretinal fluid. EZ - ellipsoid zone. ERM - epiretinal  membrane. ORA - outer retinal atrophy. ORT - outer retinal tubulation. 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 Retinal edema  H35.81 OCT, Retina - OU - Both Eyes  3. Exudative age-related macular degeneration of right eye with active choroidal neovascularization ([place])  H35.3211  4. 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 Hypertensive retinopathy of both eyes  H35.033   6. Essential hypertension  I10   7. 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 CSCR OD -- possibly chronic  - pt reports decreased vision OD since December  - reports significant stressors in life  - denies steroid use  - FA shows focal hyperfluorescent leakage points nasal macula OD -- expansile dot phenotype?  - started 50 mg po eplerenone 9.30.20  - BCVA improved slightly ot 20/70 OD  - OCT shows improved shallow SRF and ORA centrally  - differential includes atypical exudative ARMD  - cont po eplerenone 50 mg daily  - f/u 6 weeks-- DFE/OCT  4. Retinoschisis OU (OD > OS) -- stable  - bullous retinoschisis cavity IT quadrant OD-- [id number] oclock  - shallow retinoschisis cavity IT quadrant OS  - no associated retinal holes/RT/RD on scleral depression  - discussed findings and prognosis  - recommend observation -- consider laser retinopexy if schisis progresses or threatens macula or develops RD  5,6. Hypertensive retinopathy OU  - discussed importance of tight BP control  - monitor  7. 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  - discussed diagnosis and progression  - not yet visually significant  - monitor for now   Ophthalmic Meds Ordered this visit:  No orders of the defined types were placed in this encounter.      Return in about 6 weeks (around  09/19/2019) for f/u CSR OD, DFE, OCT.  There are no Patient Instructions on file for this visit.   Explained the diagnoses, plan, and follow up with the patient and they expressed understanding.  Patient expressed understanding of the importance of proper follow up care.   This document serves as a record of services personally performed by Gardiner Sleeper, MD, PhD. It was created on their behalf by Roselee Nova, COMT. The creation of this record is the provider's dictation and/or activities during the visit.  Electronically signed by: Roselee Nova, COMT 08/09/19 11:01 PM  Gardiner Sleeper, M.D., Ph.D. Diseases & Surgery of the Retina and [place] 08/09/19  I have reviewed the above documentation for accuracy and completeness, and I agree with the above. Gardiner Sleeper, M.D., Ph.D. 08/09/19 11:01 PM    Abbreviations: M myopia (nearsighted); A astigmatism; H hyperopia (farsighted); P presbyopia; Mrx spectacle prescription;  CTL contact lenses; OD right eye; OS left eye; OU both eyes  XT exotropia; ET esotropia; PEK punctate epithelial keratitis; PEE punctate epithelial erosions; DES dry eye syndrome; MGD meibomian gland dysfunction; ATs artificial tears; PFAT's preservative free artificial tears; [place] nuclear sclerotic cataract; PSC posterior subcapsular cataract; ERM epi-retinal membrane; PVD posterior vitreous detachment; RD retinal detachment; DM diabetes mellitus; DR diabetic retinopathy; NPDR non-proliferative diabetic retinopathy; PDR proliferative diabetic retinopathy; CSME clinically significant macular edema; DME diabetic macular edema; dbh dot blot hemorrhages; CWS cotton wool spot; POAG primary open angle glaucoma; C/D cup-to-disc ratio; HVF humphrey visual field; GVF goldmann visual field; OCT optical coherence tomography; IOP intraocular pressure; BRVO Branch retinal vein occlusion; CRVO central retinal vein occlusion; CRAO central retinal artery  occlusion; BRAO branch retinal artery occlusion; RT retinal tear; SB scleral buckle; PPV pars plana vitrectomy; VH Vitreous hemorrhage; PRP panretinal laser photocoagulation; IVK intravitreal kenalog; VMT vitreomacular traction; MH Macular hole;  NVD neovascularization  of the disc; NVE neovascularization elsewhere; AREDS age related eye disease study; ARMD age related macular degeneration; POAG primary open angle glaucoma; EBMD epithelial/anterior basement membrane dystrophy; ACIOL anterior chamber intraocular lens; IOL intraocular lens; PCIOL posterior chamber intraocular lens; Phaco/IOL phacoemulsification with intraocular lens placement; [place] photorefractive keratectomy; LASIK laser assisted in situ keratomileusis; HTN hypertension; DM diabetes mellitus; COPD chronic obstructive pulmonary disease

## 2019-08-08 ENCOUNTER — Ambulatory Visit (INDEPENDENT_AMBULATORY_CARE_PROVIDER_SITE_OTHER): Payer: Commercial Managed Care - PPO | Admitting: Ophthalmology

## 2019-08-08 ENCOUNTER — Encounter (INDEPENDENT_AMBULATORY_CARE_PROVIDER_SITE_OTHER): Payer: Self-pay | Admitting: Ophthalmology

## 2019-08-08 DIAGNOSIS — H353211 Exudative age-related macular degeneration, right eye, with active choroidal neovascularization: Secondary | ICD-10-CM | POA: Diagnosis not present

## 2019-08-08 DIAGNOSIS — H35711 Central serous chorioretinopathy, right eye: Secondary | ICD-10-CM | POA: Diagnosis not present

## 2019-08-08 DIAGNOSIS — H33103 Unspecified retinoschisis, bilateral: Secondary | ICD-10-CM | POA: Diagnosis not present

## 2019-08-08 DIAGNOSIS — H35033 Hypertensive retinopathy, bilateral: Secondary | ICD-10-CM

## 2019-08-08 DIAGNOSIS — H25813 Combined forms of age-related cataract, bilateral: Secondary | ICD-10-CM

## 2019-08-08 DIAGNOSIS — I10 Essential (primary) hypertension: Secondary | ICD-10-CM

## 2019-08-08 DIAGNOSIS — H3581 Retinal edema: Secondary | ICD-10-CM | POA: Diagnosis not present

## 2019-09-17 NOTE — Progress Notes (Signed)
[facility] Note  09/19/2019     CHIEF COMPLAINT Patient presents for Retina Follow Up   HISTORY OF PRESENT ILLNESS: [name] is a 48 y.o. female who presents to the clinic today for:   HPI    Retina Follow Up    Patient presents with  Other.  In right eye.  This started 6 weeks ago.  Severity is moderate.  I, the attending physician,  performed the HPI with the patient and updated documentation appropriately.          Comments    Patient here for 6 weeks retina follow up for DFE OD. Patient states vision doing good. Sees ok, fine. No eye pain.        Last edited by [name], MD on 09/19/2019  3:12 PM. (History)    pt is with an interpreter today; she states she is still taking po eplerenone 50mg , she states she feels a little tired when she takes the medication  Referring physician: Kendall Flack, MD [place],  [place] [address]  HISTORICAL INFORMATION:   Selected notes from the MEDICAL RECORD NUMBER Referred by [name] for concern of sub-macular fluid / retinoschisis LEE: 09.25.20 [name]) [BCVA: 20/80 OS: 20/25--]  Ocular Hx-cataracts, pterygium, retinoschisis, exu ARMD PMH-HTN   CURRENT MEDICATIONS: No current outpatient medications on file. (Ophthalmic Drugs)   No current facility-administered medications for this visit. (Ophthalmic Drugs)   Current Outpatient Medications (Other)  Medication Sig  . eplerenone (INSPRA) 50 MG tablet Take 1 tablet (50 mg total) by mouth daily.  . hydrochlorothiazide (HYDRODIURIL) 25 MG tablet TAKE 1 TABLET BY MOUTH ONCE DAILY IN THE MORNING FOR 90 DAYS  . lisinopril (PRINIVIL,ZESTRIL) 20 MG tablet Take 1 tablet (20 mg total) by mouth daily.   No current facility-administered medications for this visit. (Other)      REVIEW OF SYSTEMS: ROS    Positive for: Eyes   Negative for: Constitutional, Gastrointestinal, Neurological, Skin, Genitourinary,  Musculoskeletal, HENT, Endocrine, Cardiovascular, Respiratory, Psychiatric, Allergic/Imm, Heme/Lymph   Last edited by [name], COA on 09/19/2019  1:27 PM. (History)       ALLERGIES No Known Allergies  PAST MEDICAL HISTORY Past Medical History:  Diagnosis Date  . CIN I (cervical intraepithelial neoplasia I)   . High risk HPV infection   . Hypertension   . Missed ab AGUST 6 2014  . Sciatic nerve injury    Past Surgical History:  Procedure Laterality Date  . MYOMECTOMY  2008    FAMILY HISTORY Family History  Problem Relation Age of Onset  . Hypertension Mother   . Diabetes Father   . Diabetes Maternal Grandmother   . Hypertension Maternal Grandmother     SOCIAL HISTORY Social History   Tobacco Use  . Smoking status: Never Smoker  . Smokeless tobacco: Never Used  Substance Use Topics  . Alcohol use: No    Alcohol/week: 0.0 standard drinks  . Drug use: No         OPHTHALMIC EXAM:  Base Eye Exam    Visual Acuity (Snellen - Linear)      Right Left   Dist cc 20/60 -2 20/20 -2   Dist ph cc NI    Correction: Glasses       Tonometry (Tonopen, 1:25 PM)      Right Left   Pressure 17 15       Pupils      Dark  Light Shape React APD   Right 4 3 Round Brisk None   Left 4 3 Round Brisk None       Visual Fields (Counting fingers)      Left Right    Full Full       Extraocular Movement      Right Left    Full, Ortho Full, Ortho       Neuro/Psych    Oriented x3: Yes   Mood/Affect: Normal       Dilation    Both eyes: 1.0% Mydriacyl, 2.5% Phenylephrine @ 1:24 PM        Slit Lamp and Fundus Exam    Slit Lamp Exam      Right Left   Lids/Lashes Dermatochalasis - upper lid Dermatochalasis - upper lid   Conjunctiva/Sclera Injection, nasal pterygium 1+ Injection, nasal Pinguecula   Cornea Nasal ptergyium extending 2.85mm onto nasal cornea Punctate epithelial erosions   Anterior Chamber Deep, clear, narrow temporal angle deep, narrow temporal  angle, 1+pigment   Iris Round and dilated Round and dilated   Lens 2+ Nuclear sclerosis, 2+ Cortical cataract 2+ Nuclear sclerosis, 2+ Cortical cataract   Vitreous Mild Vitreous syneresis Mild Vitreous syneresis       Fundus Exam      Right Left   Disc Pink and Sharp, +cupping Pink and Sharp, +cupping   C/D Ratio 0.65 0.6   Macula Blunted foveal reflex, +shallow SRF - slightly increased, but still minimal, RPE mottling, clumping and atrophy Flat, good foveal reflex, Epiretinal membrane, mild RPE mottling superiorly, No heme or edema   Vessels Vascular attenuation Vascular attenuation, mild Tortuousity   Periphery Attached, bullous Retinoschisis cavity from 0700-0830 -- no associated RT/RD - stable from prior Attached, shallow retinoschisis cavity peripheral IT quadrant -- stable from prior        Refraction    Wearing Rx      Sphere Cylinder Axis   Right +1.25 +1.25 112   Left +2.25 +1.00 101          IMAGING AND PROCEDURES  Imaging and Procedures for @TODAY @  OCT, Retina - OU - Both Eyes       Right Eye Quality was good. Central Foveal Thickness: 220. Progression has worsened. Findings include normal foveal contour, no IRF, subretinal fluid, outer retinal atrophy, vitreomacular adhesion  (IT retinoschisis cavity caught on widefield -- stable; mild interval increase in SRF).   Left Eye Quality was good. Central Foveal Thickness: 292. Progression has been stable. Findings include normal foveal contour, no IRF, no SRF, vitreomacular adhesion , outer retinal atrophy.   Notes *Images captured and stored on drive  Diagnosis / Impression:  OD: central patchy ORA and interval increase in SRF; bullous IT retinoschisis cavity caught on widefield OS: NFP, no IRF/SRF  Clinical management:  See below  Abbreviations: NFP - Normal foveal profile. CME - cystoid macular edema. PED - pigment epithelial detachment. IRF - intraretinal fluid. SRF - subretinal fluid. EZ - ellipsoid zone.  ERM - epiretinal membrane. ORA - outer retinal atrophy. ORT - outer retinal tubulation. 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 Retinal edema  H35.81 OCT, Retina - OU - Both Eyes  3. Exudative age-related macular degeneration of right eye with active choroidal neovascularization ([place])  H35.3211   4. 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 Hypertensive retinopathy of both eyes  H35.033   6. Essential hypertension  I10   7. 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 CSCR OD -- possibly chronic  - pt reports decreased vision OD since December  - reports significant stressors in life  - denies steroid use  - FA shows focal hyperfluorescent leakage points nasal macula OD -- expansile dot phenotype?  - started 50 mg po eplerenone 9.30.20  - BCVA slightly improved to 20/60 OD  - OCT shows interval increase in SRF and ORA centrally  - differential includes atypical exudative ARMD  - cont po eplerenone 50 mg daily  - f/u 2 months -- DFE/OCT  4. Retinoschisis OU (OD > OS) -- stable  - bullous retinoschisis cavity IT quadrant OD-- [id number] oclock  - shallow retinoschisis cavity IT quadrant OS  - no associated retinal holes/RT/RD on scleral depression  - discussed findings and prognosis  - recommend observation -- consider laser retinopexy if schisis progresses or threatens macula or develops RD  5,6. Hypertensive retinopathy OU  - discussed importance of tight BP control  - monitor  7. 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  - discussed diagnosis and progression  - not yet visually significant  - monitor for now   Ophthalmic Meds Ordered this visit:  Meds ordered this encounter  Medications  . eplerenone (INSPRA) 50 MG tablet    Sig:  Take 1 tablet (50 mg total) by mouth daily.    Dispense:  30 tablet    Refill:  4       Return in about 2 months (around 11/20/2019) for f/u CSCR OD, DFE, OCT.  There are no Patient Instructions on file for this visit.   Explained the diagnoses, plan, and follow up with the patient and they expressed understanding.  Patient expressed understanding of the importance of proper follow up care.   This document serves as a record of services personally performed by Gardiner Sleeper, MD, PhD. It was created on their behalf by Roselee Nova, COMT. The creation of this record is the provider's dictation and/or activities during the visit.  Electronically signed by: Roselee Nova, COMT 09/22/19 2:16 AM   This document serves as a record of services personally performed by Gardiner Sleeper, MD, PhD. It was created on their behalf by Ernest Mallick, OA, an ophthalmic assistant. The creation of this record is the provider's dictation and/or activities during the visit.    Electronically signed by: Ernest Mallick, OA 10.29.2020 2:16 AM   Gardiner Sleeper, M.D., Ph.D. Diseases & Surgery of the Retina and Vitreous [facility]    I have reviewed the above documentation for accuracy and completeness, and I agree with the above. Gardiner Sleeper, M.D., Ph.D. 09/22/19 2:16 AM    Abbreviations: M myopia (nearsighted); A astigmatism; H hyperopia (farsighted); P presbyopia; Mrx spectacle prescription;  CTL contact lenses; OD right eye; OS left eye; OU both eyes  XT exotropia; ET esotropia; PEK punctate epithelial keratitis; PEE punctate epithelial erosions; DES dry eye syndrome; MGD meibomian gland dysfunction; ATs artificial tears; PFAT's preservative free artificial tears; [place] nuclear sclerotic cataract; PSC posterior subcapsular cataract; ERM epi-retinal membrane; PVD posterior vitreous detachment; RD retinal detachment; DM diabetes mellitus; DR diabetic retinopathy; NPDR non-proliferative  diabetic retinopathy; PDR proliferative diabetic retinopathy; CSME clinically significant  macular edema; DME diabetic macular edema; dbh dot blot hemorrhages; CWS cotton wool spot; POAG primary open angle glaucoma; C/D cup-to-disc ratio; HVF humphrey visual field; GVF goldmann visual field; OCT optical coherence tomography; IOP intraocular pressure; BRVO Branch retinal vein occlusion; CRVO central retinal vein occlusion; CRAO central retinal artery occlusion; BRAO branch retinal artery occlusion; RT retinal tear; SB scleral buckle; PPV pars plana vitrectomy; VH Vitreous hemorrhage; PRP panretinal laser photocoagulation; IVK intravitreal kenalog; VMT vitreomacular traction; MH Macular hole;  NVD neovascularization of the disc; NVE neovascularization elsewhere; AREDS age related eye disease study; ARMD age related macular degeneration; POAG primary open angle glaucoma; EBMD epithelial/anterior basement membrane dystrophy; ACIOL anterior chamber intraocular lens; IOL intraocular lens; PCIOL posterior chamber intraocular lens; Phaco/IOL phacoemulsification with intraocular lens placement; [place] photorefractive keratectomy; LASIK laser assisted in situ keratomileusis; HTN hypertension; DM diabetes mellitus; COPD chronic obstructive pulmonary disease

## 2019-09-19 ENCOUNTER — Encounter (INDEPENDENT_AMBULATORY_CARE_PROVIDER_SITE_OTHER): Payer: Self-pay | Admitting: Ophthalmology

## 2019-09-19 ENCOUNTER — Ambulatory Visit (INDEPENDENT_AMBULATORY_CARE_PROVIDER_SITE_OTHER): Payer: Commercial Managed Care - PPO | Admitting: Ophthalmology

## 2019-09-19 ENCOUNTER — Other Ambulatory Visit: Payer: Self-pay

## 2019-09-19 DIAGNOSIS — H35711 Central serous chorioretinopathy, right eye: Secondary | ICD-10-CM

## 2019-09-19 DIAGNOSIS — H353211 Exudative age-related macular degeneration, right eye, with active choroidal neovascularization: Secondary | ICD-10-CM | POA: Diagnosis not present

## 2019-09-19 DIAGNOSIS — H3581 Retinal edema: Secondary | ICD-10-CM

## 2019-09-19 DIAGNOSIS — H35033 Hypertensive retinopathy, bilateral: Secondary | ICD-10-CM

## 2019-09-19 DIAGNOSIS — H33103 Unspecified retinoschisis, bilateral: Secondary | ICD-10-CM

## 2019-09-19 DIAGNOSIS — H25813 Combined forms of age-related cataract, bilateral: Secondary | ICD-10-CM

## 2019-09-19 DIAGNOSIS — I10 Essential (primary) hypertension: Secondary | ICD-10-CM

## 2019-09-19 MED ORDER — EPLERENONE 50 MG PO TABS: 50.0000 mg | ORAL_TABLET | Freq: Every day | ORAL | 4 refills | Status: DC

## 2019-09-22 ENCOUNTER — Encounter (INDEPENDENT_AMBULATORY_CARE_PROVIDER_SITE_OTHER): Payer: Self-pay | Admitting: Ophthalmology

## 2019-09-28 ENCOUNTER — Encounter: Payer: Self-pay | Admitting: Gastroenterology

## 2019-09-28 DIAGNOSIS — E785 Hyperlipidemia, unspecified: Secondary | ICD-10-CM | POA: Diagnosis present

## 2019-10-22 ENCOUNTER — Ambulatory Visit (AMBULATORY_SURGERY_CENTER): Payer: Self-pay

## 2019-10-22 ENCOUNTER — Other Ambulatory Visit: Payer: Self-pay

## 2019-10-22 VITALS — Temp 98.4°F | Ht 64.0 in | Wt 162.2 lb

## 2019-10-22 DIAGNOSIS — Z01818 Encounter for other preprocedural examination: Secondary | ICD-10-CM

## 2019-10-22 DIAGNOSIS — H33303 Unspecified retinal break, bilateral: Secondary | ICD-10-CM

## 2019-10-22 DIAGNOSIS — Z1211 Encounter for screening for malignant neoplasm of colon: Secondary | ICD-10-CM

## 2019-10-22 DIAGNOSIS — H359 Unspecified retinal disorder: Secondary | ICD-10-CM

## 2019-10-22 HISTORY — DX: Unspecified retinal break, bilateral: H33.303

## 2019-10-22 HISTORY — DX: Unspecified retinal disorder: H35.9

## 2019-10-22 MED ORDER — NA SULFATE-K SULFATE-MG SULF 17.5-3.13-1.6 GM/177ML PO SOLN: 1.0000 | Freq: Once | ORAL | 0 refills | Status: AC

## 2019-10-22 NOTE — Progress Notes (Signed)
No egg or soy allergy known to patient  No issues with past sedation with any surgeries  or procedures, no intubation problems  No diet pills per patient No home 02 use per patient  No blood thinners per patient  Pt denies issues with constipation  No A fib or A flutter  EMMI video sent to pt's e mail  Pt had interpreter present, pt asked questions and verbalized understanding of all instructions, spanish suprep instructions provided, suprep coupon given.  Due to the COVID-19 pandemic we are asking patients to follow these guidelines. Please only bring one care partner. Please be aware that your care partner may wait in the car in the parking lot or if they feel like they will be too hot to wait in the car, they may wait in the lobby on the 4th floor. All care partners are required to wear a mask the entire time (we do not have any that we can provide them), they need to practice social distancing, and we will do a Covid check for all patient's and care partners when you arrive. Also we will check their temperature and your temperature. If the care partner waits in their car they need to stay in the parking lot the entire time and we will call them on their cell phone when the patient is ready for discharge so they can bring the car to the front of the building. Also all patient's will need to wear a mask into building.

## 2019-10-24 ENCOUNTER — Encounter: Payer: Self-pay | Admitting: Gastroenterology

## 2019-10-25 ENCOUNTER — Ambulatory Visit (INDEPENDENT_AMBULATORY_CARE_PROVIDER_SITE_OTHER): Payer: Commercial Managed Care - PPO

## 2019-10-25 DIAGNOSIS — Z1159 Encounter for screening for other viral diseases: Secondary | ICD-10-CM

## 2019-10-25 LAB — SARS CORONAVIRUS 2 (TAT 6-24 HRS): SARS Coronavirus 2: NEGATIVE

## 2019-10-30 ENCOUNTER — Other Ambulatory Visit: Payer: Self-pay

## 2019-10-30 ENCOUNTER — Encounter: Payer: Self-pay | Admitting: Gastroenterology

## 2019-10-30 ENCOUNTER — Ambulatory Visit (AMBULATORY_SURGERY_CENTER): Payer: Commercial Managed Care - PPO | Admitting: Gastroenterology

## 2019-10-30 VITALS — BP 102/71 | HR 96 | Temp 99.0°F | Resp 15 | Ht 64.0 in | Wt 162.2 lb

## 2019-10-30 DIAGNOSIS — Z1211 Encounter for screening for malignant neoplasm of colon: Secondary | ICD-10-CM | POA: Diagnosis present

## 2019-10-30 DIAGNOSIS — K5730 Diverticulosis of large intestine without perforation or abscess without bleeding: Secondary | ICD-10-CM

## 2019-10-30 DIAGNOSIS — D125 Benign neoplasm of sigmoid colon: Secondary | ICD-10-CM

## 2019-10-30 DIAGNOSIS — D123 Benign neoplasm of transverse colon: Secondary | ICD-10-CM | POA: Insufficient documentation

## 2019-10-30 DIAGNOSIS — K635 Polyp of colon: Secondary | ICD-10-CM | POA: Insufficient documentation

## 2019-10-30 DIAGNOSIS — R933 Abnormal findings on diagnostic imaging of other parts of digestive tract: Secondary | ICD-10-CM | POA: Insufficient documentation

## 2019-10-30 DIAGNOSIS — K640 First degree hemorrhoids: Secondary | ICD-10-CM

## 2019-10-30 MED ORDER — SODIUM CHLORIDE 0.9 % IV SOLN: 500.0000 mL | Freq: Once | INTRAVENOUS | Status: DC

## 2019-10-30 NOTE — Op Note (Signed)
[place] Patient Name: [name] Procedure Date: 10/30/2019 10:00 AM MRN: [id number] Endoscopist: Gerrit Heck , MD Age: 49 Referring MD:  Date of Birth: 1970/11/25 Gender: Female Account #: [id number] Procedure:                Colonoscopy Indications:              Screening for colorectal malignant neoplasm, This                            is the patient's first colonoscopy Medicines:                Monitored Anesthesia Care Procedure:                Pre-Anesthesia Assessment:                           - Prior to the procedure, a History and Physical                            was performed, and patient medications and                            allergies were reviewed. The patient's tolerance of                            previous anesthesia was also reviewed. The risks                            and benefits of the procedure and the sedation                            options and risks were discussed with the patient.                            All questions were answered, and informed consent                            was obtained. Prior Anticoagulants: The patient has                            taken no previous anticoagulant or antiplatelet                            agents. ASA Grade Assessment: II - A patient with                            mild systemic disease. After reviewing the risks                            and benefits, the patient was deemed in                            satisfactory condition to undergo the procedure.  After obtaining informed consent, the colonoscope                            was passed under direct vision. Throughout the                            procedure, the patient's blood pressure, pulse, and                            oxygen saturations were monitored continuously. The                            Colonoscope was introduced through the anus and                            advanced to the the  cecum, identified by                            appendiceal orifice and ileocecal valve. The                            colonoscopy was performed without difficulty. The                            patient tolerated the procedure well. The quality                            of the bowel preparation was good. The ileocecal                            valve, appendiceal orifice, and rectum were                            photographed. Scope In: 10:08:18 AM Scope Out: 10:25:44 AM Scope Withdrawal Time: 0 hours 9 minutes 48 seconds  Total Procedure Duration: 0 hours 17 minutes 26 seconds  Findings:                 The perianal and digital rectal examinations were                            normal.                           A 12 mm polyp was found in the transverse colon.                            The polyp was semi-pedunculated. The polyp was                            removed with a cold snare. Resection and retrieval                            were complete. Estimated blood loss was minimal.  A 2 mm polyp was found in the sigmoid colon. The                            polyp was sessile. The polyp was removed with a                            cold snare. Resection and retrieval were complete.                            Estimated blood loss was minimal.                           A single medium-mouthed diverticulum was found in                            the ascending colon.                           Non-bleeding internal hemorrhoids were found during                            retroflexion. The hemorrhoids were small. Complications:            No immediate complications. Estimated Blood Loss:     Estimated blood loss was minimal. Impression:               - One 12 mm polyp in the transverse colon, removed                            with a cold snare. Resected and retrieved.                           - One 2 mm polyp in the sigmoid colon, removed with                             a cold snare. Resected and retrieved.                           - Diverticulosis in the ascending colon.                           - Non-bleeding internal hemorrhoids. Recommendation:           - Patient has a contact number available for                            emergencies. The signs and symptoms of potential                            delayed complications were discussed with the                            patient. Return to normal activities tomorrow.  Written discharge instructions were provided to the                            patient.                           - Resume previous diet.                           - Continue present medications.                           - Await pathology results.                           - Repeat colonoscopy in 3 years for surveillance.                           - Return to GI clinic PRN.                           - Use fiber, for example Citrucel, Fibercon, Konsyl                            or Metamucil.                           - Internal hemorrhoids were noted on this study and                            may be amenable to hemorrhoid band ligation. If you                            are interested in further treatment of these                            hemorrhoids with band ligation, please contact my                            clinic to set up an appointment for evaluation and                            treatment. Gerrit Heck, MD 10/30/2019 10:29:37 AM

## 2019-10-30 NOTE — Patient Instructions (Signed)
Discharge instructions given. Handouts on polyps and hemorrhoids given in spanish. Resume previous medications. YOU HAD AN ENDOSCOPIC PROCEDURE TODAY AT [facility]:   Refer to the procedure report that was given to you for any specific questions about what was found during the examination.  If the procedure report does not answer your questions, please call your gastroenterologist to clarify.  If you requested that your care partner not be given the details of your procedure findings, then the procedure report has been included in a sealed envelope for you to review at your convenience later.  YOU SHOULD EXPECT: Some feelings of bloating in the abdomen. Passage of more gas than usual.  Walking can help get rid of the air that was put into your GI tract during the procedure and reduce the bloating. If you had a lower endoscopy (such as a colonoscopy or flexible sigmoidoscopy) you may notice spotting of blood in your stool or on the toilet paper. If you underwent a bowel prep for your procedure, you may not have a normal bowel movement for a few days.  Please Note:  You might notice some irritation and congestion in your nose or some drainage.  This is from the oxygen used during your procedure.  There is no need for concern and it should clear up in a day or so.  SYMPTOMS TO REPORT IMMEDIATELY:   Following lower endoscopy (colonoscopy or flexible sigmoidoscopy):  Excessive amounts of blood in the stool  Significant tenderness or worsening of abdominal pains  Swelling of the abdomen that is new, acute  Fever of 100F or higher   For urgent or emergent issues, a gastroenterologist can be reached at any hour by calling [phone].   DIET:  We do recommend a small meal at first, but then you may proceed to your regular diet.  Drink plenty of fluids but you should avoid alcoholic beverages for 24 hours.  ACTIVITY:  You should plan to take it easy for the rest of today and you  should NOT DRIVE or use heavy machinery until tomorrow (because of the sedation medicines used during the test).    FOLLOW UP: Our staff will call the number listed on your records 48-72 hours following your procedure to check on you and address any questions or concerns that you may have regarding the information given to you following your procedure. If we do not reach you, we will leave a message.  We will attempt to reach you two times.  During this call, we will ask if you have developed any symptoms of COVID 19. If you develop any symptoms (ie: fever, flu-like symptoms, shortness of breath, cough etc.) before then, please call [phone].  If you test positive for Covid 19 in the 2 weeks post procedure, please call and report this information to [place].    If any biopsies were taken you will be contacted by phone or by letter within the next 1-3 weeks.  Please call us at [phone] if you have not heard about the biopsies in 3 weeks.    SIGNATURES/CONFIDENTIALITY: You and/or your care partner have signed paperwork which will be entered into your electronic medical record.  These signatures attest to the fact that that the information above on your After Visit Summary has been reviewed and is understood.  Full responsibility of the confidentiality of this discharge information lies with you and/or your care-partner.

## 2019-10-30 NOTE — Progress Notes (Signed)
Called to room to assist during endoscopic procedure.  Patient ID and intended procedure confirmed with present staff. Received instructions for my participation in the procedure from the performing physician.  

## 2019-10-30 NOTE — Progress Notes (Signed)
Pt's states no medical or surgical changes since previsit or office visit. 

## 2019-10-30 NOTE — Progress Notes (Signed)
To PACU, VSS. Report to [email or web address] 

## 2019-11-01 ENCOUNTER — Encounter: Payer: Self-pay | Admitting: Gastroenterology

## 2019-11-01 ENCOUNTER — Telehealth: Payer: Self-pay | Admitting: *Deleted

## 2019-11-01 NOTE — Telephone Encounter (Signed)
1. Have you developed a fever since your procedure? no  2.   Have you had an respiratory symptoms (SOB or cough) since your procedure? no  3.   Have you tested positive for COVID 19 since your procedure no  4.   Have you had any family members/close contacts diagnosed with the COVID 19 since your procedure?  no   If yes to any of these questions please route to Joylene John, RN and Alphonsa Gin, Therapist, sports.  Follow up Call-  Call back number 10/30/2019  Post procedure Call Back phone  # [phone]  Permission to leave phone message Yes  Some recent data might be hidden     Patient questions:  Do you have a fever, pain , or abdominal swelling? No. Pain Score  0 *  Have you tolerated food without any problems? Yes.    Have you been able to return to your normal activities? Yes.    Do you have any questions about your discharge instructions: Diet   No. Medications  No. Follow up visit  No.  Do you have questions or concerns about your Care? No.  Actions: * If pain score is 4 or above: No action needed, pain <4.

## 2019-11-02 ENCOUNTER — Other Ambulatory Visit: Payer: Self-pay | Admitting: Obstetrics & Gynecology

## 2019-11-02 DIAGNOSIS — Z1231 Encounter for screening mammogram for malignant neoplasm of breast: Secondary | ICD-10-CM

## 2019-11-07 ENCOUNTER — Other Ambulatory Visit: Payer: Self-pay

## 2019-11-08 ENCOUNTER — Ambulatory Visit: Payer: Commercial Managed Care - PPO | Admitting: Obstetrics & Gynecology

## 2019-11-08 ENCOUNTER — Encounter: Payer: Self-pay | Admitting: Obstetrics & Gynecology

## 2019-11-08 VITALS — BP 110/72 | Ht 64.0 in | Wt 166.0 lb

## 2019-11-08 DIAGNOSIS — Z789 Other specified health status: Secondary | ICD-10-CM

## 2019-11-08 DIAGNOSIS — E663 Overweight: Secondary | ICD-10-CM

## 2019-11-08 DIAGNOSIS — N870 Mild cervical dysplasia: Secondary | ICD-10-CM | POA: Diagnosis not present

## 2019-11-08 DIAGNOSIS — Z01419 Encounter for gynecological examination (general) (routine) without abnormal findings: Secondary | ICD-10-CM | POA: Diagnosis not present

## 2019-11-08 NOTE — Progress Notes (Signed)
[name] 28-Mar-1971 [id number]   History:    49 y.o.  G1P0A1 Boyfriend x 5 yrs  RP:  Established patient presenting for annual gyn exam with repeat Pap test  HPI: H/O LGSIL/HPV HR pos.  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  Menses regular normal every month.  No pelvic pain.  Using condoms.  No pain with IC.  Breasts normal.  BMI 28.49.  Colono 10/2019 2 Polyps removed, repeat Colono at 3 yrs.  Health labs with Fam MD.    Past medical history,surgical history, family history and social history were all reviewed and documented in the EPIC chart.  Gynecologic History Patient's last menstrual period was 11/01/2019.  Obstetric History OB History  Gravida Para Term Preterm AB Living  1 0     1 0  SAB TAB Ectopic Multiple Live Births  1            # Outcome Date GA Lbr Len/2nd Weight Sex Delivery Anes PTL Lv  1 SAB              ROS: A ROS was performed and pertinent positives and negatives are included in the history.  GENERAL: No fevers or chills. HEENT: No change in vision, no earache, sore throat or sinus congestion. NECK: No pain or stiffness. CARDIOVASCULAR: No chest pain or pressure. No palpitations. PULMONARY: No shortness of breath, cough or wheeze. GASTROINTESTINAL: No abdominal pain, nausea, vomiting or diarrhea, melena or bright red blood per rectum. GENITOURINARY: No urinary frequency, urgency, hesitancy or dysuria. MUSCULOSKELETAL: No joint or muscle pain, no back pain, no recent trauma. DERMATOLOGIC: No rash, no itching, no lesions. ENDOCRINE: No polyuria, polydipsia, no heat or cold intolerance. No recent change in weight. HEMATOLOGICAL: No anemia or easy bruising or bleeding. NEUROLOGIC: No headache, seizures, numbness, tingling or weakness. PSYCHIATRIC: No depression, no loss of interest in normal activity or change in sleep pattern.     Exam:   BP 110/72 (BP Location: Right Arm, Patient Position: Sitting, Cuff Size: Normal)   Ht 5\' 4"  (1.626 m)   Wt 166 lb (75.3 kg)   LMP  11/01/2019   BMI 28.49 kg/m   Body mass index is 28.49 kg/m.  General appearance : Well developed well nourished female. No acute distress HEENT: Eyes: no retinal hemorrhage or exudates,  Neck supple, trachea midline, no carotid bruits, no thyroidmegaly Lungs: Clear to auscultation, no rhonchi or wheezes, or rib retractions  Heart: Regular rate and rhythm, no murmurs or gallops Breast:Examined in sitting and supine position were symmetrical in appearance, no palpable masses or tenderness,  no skin retraction, no nipple inversion, no nipple discharge, no skin discoloration, no axillary or supraclavicular lymphadenopathy Abdomen: no palpable masses or tenderness, no rebound or guarding Extremities: no edema or skin discoloration or tenderness  Pelvic: Vulva: Normal             Vagina: No gross lesions or discharge  Cervix: No gross lesions or discharge.  Pap reflex done.  Uterus  AV, normal size, shape and consistency, non-tender and mobile  Adnexa  Without masses or tenderness  Anus: Normal   Assessment/Plan:  49 y.o. female for annual exam   1. Encounter for gynecological examination with Papanicolaou smear of cervix Normal gynecologic exam.  CIN-1 in 2019, Pap reflex done today.  Breast exam normal.  Screening mammo scheduled for 12/2019.  Colono 10/2019 Polyps, q3 yrs.  Health Labs with Fam MD.  2. Use of condoms for contraception  3. Dysplasia of cervix,  low grade (CIN 1) Pap reflex done today.  4. Overweight (BMI 25.0-29.9) Recommend a lower calorie/carb diet such as [employer].  Aerobic activities 5 times a week and light weightlifting every 2 days.  Princess Bruins MD, 3:57 PM 11/08/2019

## 2019-11-08 NOTE — Patient Instructions (Signed)
1. Encounter for gynecological examination with Papanicolaou smear of cervix Normal gynecologic exam.  CIN-1 in 2019, Pap reflex done today.  Breast exam normal.  Screening mammo scheduled for 12/2019.  Colono 10/2019 Polyps, q3 yrs.  Health Labs with Fam MD.  2. Use of condoms for contraception  3. Dysplasia of cervix, low grade (CIN 1) Pap reflex done today.  4. Overweight (BMI 25.0-29.9) Recommend a lower calorie/carb diet such as [employer].  Aerobic activities 5 times a week and light weightlifting every 2 days.  Jasmine Awe, fue un placer verle hoy!  Voy a informarle de sus [employer].

## 2019-11-09 LAB — PAP IG W/ RFLX HPV ASCU

## 2019-11-19 NOTE — Progress Notes (Signed)
[facility] Note  11/20/2019     CHIEF COMPLAINT Patient presents for Retina Follow Up   HISTORY OF PRESENT ILLNESS: [name] is a 49 y.o. female who presents to the clinic today for:   HPI    Retina Follow Up    Patient presents with  Other (CSR).  In right eye.  Severity is moderate.  Duration of 2 months.  Since onset it is stable.  I, the attending physician,  performed the HPI with the patient and updated documentation appropriately.          Comments    Patient states vision the same OU. Ran out of eplerenone end of January.       Last edited by [name], MD on 11/23/2019  7:44 PM. (History)    pt is with an interpreter today; she states she has not noticed any change in vision and no new flashes or floaters, pt states she has not been working and trying to keep her stress level under control   Referring physician: Kendall Flack, MD [place],  [place] [id number]  HISTORICAL INFORMATION:   Selected notes from the MEDICAL RECORD NUMBER Referred by [name] for concern of sub-macular fluid / retinoschisis   CURRENT MEDICATIONS: No current outpatient medications on file. (Ophthalmic Drugs)   No current facility-administered medications for this visit. (Ophthalmic Drugs)   Current Outpatient Medications (Other)  Medication Sig  . eplerenone (INSPRA) 50 MG tablet Take 1 tablet (50 mg total) by mouth daily.  Marland Kitchen FLUoxetine (PROZAC) 20 MG capsule Take 20 mg by mouth daily.  . hydrochlorothiazide (HYDRODIURIL) 25 MG tablet TAKE 1 TABLET BY MOUTH ONCE DAILY IN THE MORNING FOR 90 DAYS  . lisinopril (PRINIVIL,ZESTRIL) 20 MG tablet Take 1 tablet (20 mg total) by mouth daily.   No current facility-administered medications for this visit. (Other)      REVIEW OF SYSTEMS: ROS    Positive for: Eyes   Negative for: Constitutional, Gastrointestinal, Neurological, Skin, Genitourinary, Musculoskeletal, HENT,  Endocrine, Cardiovascular, Respiratory, Psychiatric, Allergic/Imm, Heme/Lymph   Last edited by Roselee Nova D, COT on 11/20/2019  1:15 PM. (History)       ALLERGIES No Known Allergies  PAST MEDICAL HISTORY Past Medical History:  Diagnosis Date  . CIN I (cervical intraepithelial neoplasia I)   . Depression   . High risk HPV infection   . Hypertension   . Missed ab AGUST 6 2014  . Retina disorder 10/22/2019   pt states taking inspra for excess fluid in retina  . Sciatic nerve injury    Past Surgical History:  Procedure Laterality Date  . MYOMECTOMY  2008    FAMILY HISTORY Family History  Problem Relation Age of Onset  . Hypertension Mother   . Diabetes Father   . Diabetes Maternal Grandmother   . Hypertension Maternal Grandmother   . Colon cancer Neg Hx   . Colon polyps Neg Hx   . Esophageal cancer Neg Hx   . Rectal cancer Neg Hx   . Stomach cancer Neg Hx     SOCIAL HISTORY Social History   Tobacco Use  . Smoking status: Never Smoker  . Smokeless tobacco: Never Used  Substance Use Topics  . Alcohol use: No    Alcohol/week: 0.0 standard drinks  . Drug use: No         OPHTHALMIC EXAM:  Base Eye Exam    Visual Acuity (Snellen - Linear)  Right Left   Dist cc 20/60 -2 20/30 +2   Dist ph cc NI 20/25 -2   Correction: Glasses       Tonometry (Tonopen, 1:23 PM)      Right Left   Pressure 17 14       Pupils      Dark Light Shape React APD   Right 4 3 Round Brisk None   Left 4 3 Round Brisk None       Visual Fields (Counting fingers)      Left Right    Full Full       Extraocular Movement      Right Left    Full, Ortho Full, Ortho       Neuro/Psych    Oriented x3: Yes   Mood/Affect: Normal       Dilation    Both eyes: 1.0% Mydriacyl, 2.5% Phenylephrine @ 1:23 PM        Slit Lamp and Fundus Exam    Slit Lamp Exam      Right Left   Lids/Lashes Dermatochalasis - upper lid Dermatochalasis - upper lid   Conjunctiva/Sclera Injection,  nasal pterygium 1+ Injection, nasal Pinguecula   Cornea Nasal ptergyium extending 2.1mm onto nasal cornea 1+Punctate epithelial erosions   Anterior Chamber Deep, clear, narrow temporal angle deep, narrow temporal angle, 1+pigment   Iris Round and dilated Round and dilated   Lens 2+ Nuclear sclerosis, 2+ Cortical cataract 2+ Nuclear sclerosis, 2+ Cortical cataract   Vitreous Mild Vitreous syneresis Mild Vitreous syneresis       Fundus Exam      Right Left   Disc Pink and Sharp, +cupping Pink and Sharp, +cupping   C/D Ratio 0.6 0.6   Macula Flat, Blunted foveal reflex, no SRF, RPE mottling, clumping and atrophy, No heme or edema Flat, good foveal reflex, Epiretinal membrane, mild RPE mottling superiorly, No heme or edema   Vessels Vascular attenuation Vascular attenuation, mild Tortuousity   Periphery Attached, bullous Retinoschisis cavity from 0700-0830 -- no associated RT/RD - stable from prior Attached, shallow retinoschisis cavity peripheral IT quadrant -- stable from prior        Refraction    Wearing Rx      Sphere Cylinder Axis   Right +1.25 +1.25 112   Left +2.25 +1.00 101          IMAGING AND PROCEDURES  Imaging and Procedures for @TODAY @  OCT, Retina - OU - Both Eyes       Right Eye Quality was good. Central Foveal Thickness: 209. Progression has improved. Findings include normal foveal contour, no IRF, outer retinal atrophy, vitreomacular adhesion  (Interval improvement in SRF / CSR -- residual ORA; persistent IT quad retinoschisis cavity caught on widefield).   Left Eye Quality was good. Central Foveal Thickness: 293. Progression has been stable. Findings include normal foveal contour, no IRF, no SRF, vitreomacular adhesion , outer retinal atrophy.   Notes *Images captured and stored on drive  Diagnosis / Impression:  OD: Interval improvement in SRF / CSR -- residual ORA; persistent IT quad retinoschisis cavity caught on widefield OS: NFP, no  IRF/SRF  Clinical management:  See below  Abbreviations: NFP - Normal foveal profile. CME - cystoid macular edema. PED - pigment epithelial detachment. IRF - intraretinal fluid. SRF - subretinal fluid. EZ - ellipsoid zone. ERM - epiretinal membrane. ORA - outer retinal atrophy. ORT - outer retinal tubulation. 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 Retinal edema  H35.81 OCT, Retina - OU - Both Eyes  3. 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 Hypertensive retinopathy of both eyes  H35.033   6. Essential hypertension  I10   7. 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 CSCR OD -- likely chronic  - pt reports decreased vision OD since December 2019  - reports significant stressors in life  - denies steroid use  - FA (09.30.20) shows focal hyperfluorescent leakage points nasal macula OD -- expansile dot phenotype?  - on 50 mg po eplerenone since 9.30.20  - BCVA stable at 20/60 OD  - OCT shows interval improvement in SRF and persistent ORA centrally  - differential includes atypical exudative ARMD  - cont po eplerenone 50 mg daily  - f/u 3 months, sooner prn -- DFE/OCT  4. Retinoschisis OU (OD > OS) -- stable  - bullous retinoschisis cavity IT quadrant OD-- [id number] oclock  - shallow retinoschisis cavity IT quadrant OS  - no associated retinal holes/RT/RD on scleral depression  - discussed findings and prognosis  - recommend observation -- consider laser retinopexy if schisis progresses or threatens macula or develops RD  5,6. Hypertensive retinopathy OU  - discussed importance of tight BP control  - monitor  7. 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  - discussed diagnosis and progression  - not yet visually significant  - monitor for now   Ophthalmic Meds Ordered this visit:  Meds ordered this encounter  Medications  . eplerenone (INSPRA) 50 MG tablet    Sig: Take 1 tablet (50 mg total) by mouth daily.    Dispense:  30 tablet    Refill:  6       Return in about 3 months (around 02/17/2020) for f/u CSCR OD, DFE, OCT.  There are no Patient Instructions on file for this visit.   Explained the diagnoses, plan, and follow up with the patient and they expressed understanding.  Patient expressed understanding of the importance of proper follow up care.   This document serves as a record of services personally performed by Gardiner Sleeper, MD, PhD. It was created on their behalf by Estill Bakes, COT an ophthalmic technician. The creation of this record is the provider's dictation and/or activities during the visit.    Electronically signed by: Estill Bakes, COT 11/19/19 @ 7:48 PM   This document serves as a record of services personally performed by Gardiner Sleeper, MD, PhD. It was created on their behalf by Ernest Mallick, OA, an ophthalmic assistant. The creation of this record is the provider's dictation and/or activities during the visit.    Electronically signed by: Ernest Mallick, OA 02.09.2021 7:48 PM  Gardiner Sleeper, M.D., Ph.D. Diseases & Surgery of the Retina and [place] 11/20/2019   I have reviewed the above documentation for accuracy and completeness, and I agree with the above. Gardiner Sleeper, M.D., Ph.D. 11/23/19 7:50 PM   Abbreviations: M myopia (nearsighted); A astigmatism; H hyperopia (farsighted); P presbyopia; Mrx spectacle prescription;  CTL contact lenses; OD right eye; OS left eye; OU both eyes  XT exotropia; ET  esotropia; PEK punctate epithelial keratitis; PEE punctate epithelial erosions; DES dry eye syndrome; MGD meibomian gland  dysfunction; ATs artificial tears; PFAT's preservative free artificial tears; [place] nuclear sclerotic cataract; PSC posterior subcapsular cataract; ERM epi-retinal membrane; PVD posterior vitreous detachment; RD retinal detachment; DM diabetes mellitus; DR diabetic retinopathy; NPDR non-proliferative diabetic retinopathy; PDR proliferative diabetic retinopathy; CSME clinically significant macular edema; DME diabetic macular edema; dbh dot blot hemorrhages; CWS cotton wool spot; POAG primary open angle glaucoma; C/D cup-to-disc ratio; HVF humphrey visual field; GVF goldmann visual field; OCT optical coherence tomography; IOP intraocular pressure; BRVO Branch retinal vein occlusion; CRVO central retinal vein occlusion; CRAO central retinal artery occlusion; BRAO branch retinal artery occlusion; RT retinal tear; SB scleral buckle; PPV pars plana vitrectomy; VH Vitreous hemorrhage; PRP panretinal laser photocoagulation; IVK intravitreal kenalog; VMT vitreomacular traction; MH Macular hole;  NVD neovascularization of the disc; NVE neovascularization elsewhere; AREDS age related eye disease study; ARMD age related macular degeneration; POAG primary open angle glaucoma; EBMD epithelial/anterior basement membrane dystrophy; ACIOL anterior chamber intraocular lens; IOL intraocular lens; PCIOL posterior chamber intraocular lens; Phaco/IOL phacoemulsification with intraocular lens placement; [place] photorefractive keratectomy; LASIK laser assisted in situ keratomileusis; HTN hypertension; DM diabetes mellitus; COPD chronic obstructive pulmonary disease

## 2019-11-20 ENCOUNTER — Ambulatory Visit (INDEPENDENT_AMBULATORY_CARE_PROVIDER_SITE_OTHER): Payer: Commercial Managed Care - PPO | Admitting: Ophthalmology

## 2019-11-20 ENCOUNTER — Encounter (INDEPENDENT_AMBULATORY_CARE_PROVIDER_SITE_OTHER): Payer: Self-pay | Admitting: Ophthalmology

## 2019-11-20 DIAGNOSIS — H35711 Central serous chorioretinopathy, right eye: Secondary | ICD-10-CM | POA: Diagnosis not present

## 2019-11-20 DIAGNOSIS — Z8619 Personal history of other infectious and parasitic diseases: Secondary | ICD-10-CM | POA: Insufficient documentation

## 2019-11-20 DIAGNOSIS — H353211 Exudative age-related macular degeneration, right eye, with active choroidal neovascularization: Secondary | ICD-10-CM

## 2019-11-20 DIAGNOSIS — H33103 Unspecified retinoschisis, bilateral: Secondary | ICD-10-CM

## 2019-11-20 DIAGNOSIS — H25813 Combined forms of age-related cataract, bilateral: Secondary | ICD-10-CM

## 2019-11-20 DIAGNOSIS — H3581 Retinal edema: Secondary | ICD-10-CM | POA: Diagnosis not present

## 2019-11-20 DIAGNOSIS — I10 Essential (primary) hypertension: Secondary | ICD-10-CM

## 2019-11-20 DIAGNOSIS — H35033 Hypertensive retinopathy, bilateral: Secondary | ICD-10-CM

## 2019-11-20 MED ORDER — EPLERENONE 50 MG PO TABS: 50.0000 mg | ORAL_TABLET | Freq: Every day | ORAL | 6 refills | Status: DC

## 2019-11-23 ENCOUNTER — Encounter (INDEPENDENT_AMBULATORY_CARE_PROVIDER_SITE_OTHER): Payer: Self-pay | Admitting: Ophthalmology

## 2019-12-13 ENCOUNTER — Ambulatory Visit
Admission: RE | Admit: 2019-12-13 | Discharge: 2019-12-13 | Disposition: A | Discharge status: Home / Self Care | Payer: Commercial Managed Care - PPO | Source: Ambulatory Visit | Attending: Obstetrics & Gynecology | Admitting: Obstetrics & Gynecology

## 2019-12-13 ENCOUNTER — Other Ambulatory Visit: Payer: Self-pay

## 2019-12-13 DIAGNOSIS — Z1231 Encounter for screening mammogram for malignant neoplasm of breast: Secondary | ICD-10-CM

## 2019-12-20 ENCOUNTER — Other Ambulatory Visit: Payer: Self-pay

## 2019-12-21 ENCOUNTER — Ambulatory Visit: Payer: Commercial Managed Care - PPO | Admitting: Obstetrics & Gynecology

## 2019-12-21 ENCOUNTER — Encounter: Payer: Self-pay | Admitting: Obstetrics & Gynecology

## 2019-12-21 VITALS — BP 124/82

## 2019-12-21 DIAGNOSIS — R87612 Low grade squamous intraepithelial lesion on cytologic smear of cervix (LGSIL): Secondary | ICD-10-CM | POA: Diagnosis not present

## 2019-12-21 DIAGNOSIS — N870 Mild cervical dysplasia: Secondary | ICD-10-CM | POA: Diagnosis not present

## 2019-12-21 NOTE — Progress Notes (Signed)
    [name] Feb 26, 1971 [id number]        49 y.o.  G1P0010 boyfriend for 5 years.  RP: LGSIL for Colposcopy  HPI: Last Pap test January 2021 showed LGSIL.  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 Menses regular normal every month. No pelvic pain. Using condoms. No pain with IC.   OB History  Gravida Para Term Preterm AB Living  1 0     1 0  SAB TAB Ectopic Multiple Live Births  1            # Outcome Date GA Lbr Len/2nd Weight Sex Delivery Anes PTL Lv  1 SAB             Past medical history,surgical history, problem list, medications, allergies, family history and social history were all reviewed and documented in the EPIC chart.   Directed ROS with pertinent positives and negatives documented in the history of present illness/assessment and plan.  Exam:  Vitals:   12/21/19 1459  BP: 124/82   General appearance:  Normal  Colposcopy Procedure Note [name] 12/21/2019  Indications: LGSIL/H/O HPV HR pos/H/O CIN 1  Procedure Details  The risks and benefits of the procedure and Verbal informed consent obtained.  Speculum placed in vagina and excellent visualization of cervix achieved, cervix swabbed x 3 with acetic acid solution.  Findings:  Cervix colposcopy: Physical Exam Genitourinary:       Vaginal colposcopy: Normal  Vulvar colposcopy: Normal  Perirectal colposcopy: Normal  The cervix was sprayed with [place] before performing the cervical biopsies.  Specimens: HPV 16-18-45.  Cervical Biopsies at 6 and 11 O'Clock.  Complications:  None, good hemostasis with Silver Nitrate . Plan:  Management per results   Assessment/Plan:  49 y.o. G1P0010   1. LGSIL on Pap smear of cervix History of CIN-1 with recent Pap test on January 2021 showing LGSIL.  HPV type 16-18-45 tested today.  Colposcopy findings shared with patient.  Cervical biopsies done at 6 and 11:00.  Management per results. - Pathology Report ([facility]) - HPV Type 16 and  18/45 RNA  2. Dysplasia of cervix, low grade (CIN 1) As above.  Connie Bruins MD, 3:35 PM 12/21/2019

## 2019-12-22 ENCOUNTER — Encounter: Payer: Self-pay | Admitting: Obstetrics & Gynecology

## 2019-12-22 NOTE — Patient Instructions (Signed)
1. LGSIL on Pap smear of cervix History of CIN-1 with recent Pap test on January 2021 showing LGSIL.  HPV type 16-18-45 tested today.  Colposcopy findings shared with patient.  Cervical biopsies done at 6 and 11:00.  Management per results. - Pathology Report ([facility]) - HPV Type 16 and 18/45 RNA  2. Dysplasia of cervix, low grade (CIN 1) As above.  [name], it was a pleasure seeing you today!  I will inform you of your results as soon as they are available.

## 2019-12-25 LAB — TISSUE PATH REPORT

## 2019-12-25 LAB — PATHOLOGY REPORT

## 2019-12-26 LAB — HPV TYPE 16 AND 18/45 RNA
HPV Type 16 RNA: NOT DETECTED
HPV Type 18/45 RNA: NOT DETECTED

## 2020-03-18 NOTE — Progress Notes (Signed)
[facility] Note  03/19/2020     CHIEF COMPLAINT Patient presents for Retina Follow Up   HISTORY OF PRESENT ILLNESS: [name] is a 49 y.o. female who presents to the clinic today for:   HPI    Retina Follow Up    Patient presents with  Other.  In right eye.  This started months ago.  Severity is moderate.  Duration of 4 months.  Since onset it is stable.  I, the attending physician,  performed the HPI with the patient and updated documentation appropriately.          Comments    49 y/o female pt here for 4 mo f/u for CSCR OD.  Has not noticed any change in [place] OU.  Got new glasses from [name] about 2 mos ago.  Denies pain, floaters, but a few days ago noticed some intermittent flashes OD.  Pt was very stressed out at the time, so she attributes symptoms to that.  Symptoms resolved on their own, and have not reappeared since.  AT prn OU to relieve seasonal allergy symptoms.  Responses aided by translator.       Last edited by [name], MD on 03/25/2020 10:17 PM. (History)    pt is with an interpreter today; she states she is doing well, she feels like her vision is fine, but sometimes she has to squint to see better, she states she does have a blurry spot in her central vision when she covers her left eye, pt has not taken the eplerenone since February bc she went to pick up a refill and the pharmacy said they didn't have one, she states allergies have been bothering her and making her eyes itchy  Referring physician: Kendall Flack, MD [place],  [place] [id number]  HISTORICAL INFORMATION:   Selected notes from the MEDICAL RECORD NUMBER Referred by [name] for concern of sub-macular fluid / retinoschisis   CURRENT MEDICATIONS: No current outpatient medications on file. (Ophthalmic Drugs)   No current facility-administered medications for this visit. (Ophthalmic Drugs)   Current Outpatient Medications  (Other)  Medication Sig  . eplerenone (INSPRA) 50 MG tablet Take 1 tablet (50 mg total) by mouth daily.  Marland Kitchen FLUoxetine (PROZAC) 20 MG capsule Take 20 mg by mouth daily.  . hydrochlorothiazide (HYDRODIURIL) 25 MG tablet TAKE 1 TABLET BY MOUTH ONCE DAILY IN THE MORNING FOR 90 DAYS  . lisinopril (PRINIVIL,ZESTRIL) 20 MG tablet Take 1 tablet (20 mg total) by mouth daily.   No current facility-administered medications for this visit. (Other)      REVIEW OF SYSTEMS: ROS    Positive for: Eyes   Negative for: Constitutional, Gastrointestinal, Neurological, Skin, Genitourinary, Musculoskeletal, HENT, Endocrine, Cardiovascular, Respiratory, Psychiatric, Allergic/Imm, Heme/Lymph   Last edited by Matthew Folks, COA on 03/19/2020 12:56 PM. (History)       ALLERGIES No Known Allergies  PAST MEDICAL HISTORY Past Medical History:  Diagnosis Date  . Cataract    Mixed form OU  . CIN I (cervical intraepithelial neoplasia I)   . Depression   . High risk HPV infection   . Hypertension   . Hypertensive retinopathy    OU  . Missed ab AGUST 6 2014  . Retina disorder 10/22/2019   pt states taking inspra for excess fluid in retina  . Sciatic nerve injury    Past Surgical History:  Procedure Laterality Date  . MYOMECTOMY  2008  FAMILY HISTORY Family History  Problem Relation Age of Onset  . Hypertension Mother   . Diabetes Father   . Diabetes Maternal Grandmother   . Hypertension Maternal Grandmother   . Colon cancer Neg Hx   . Colon polyps Neg Hx   . Esophageal cancer Neg Hx   . Rectal cancer Neg Hx   . Stomach cancer Neg Hx     SOCIAL HISTORY Social History   Tobacco Use  . Smoking status: Never Smoker  . Smokeless tobacco: Never Used  Vaping Use  . Vaping Use: Never used  Substance Use Topics  . Alcohol use: No    Alcohol/week: 0.0 standard drinks  . Drug use: No         OPHTHALMIC EXAM:  Base Eye Exam    Visual Acuity (Snellen - Linear)      Right Left    Dist cc 20/70 -2 20/20 -2   Dist ph cc NI    Correction: Glasses       Tonometry (Tonopen, 12:59 PM)      Right Left   Pressure 17 14       Pupils      Dark Light Shape React APD   Right 4 3 Round Brisk None   Left 4 3 Round Brisk None       Visual Fields (Counting fingers)      Left Right    Full Full       Extraocular Movement      Right Left    Full, Ortho Full, Ortho       Neuro/Psych    Oriented x3: Yes   Mood/Affect: Normal       Dilation    Both eyes: 1.0% Mydriacyl, 2.5% Phenylephrine @ 12:59 PM        Slit Lamp and Fundus Exam    Slit Lamp Exam      Right Left   Lids/Lashes Dermatochalasis - upper lid Dermatochalasis - upper lid   Conjunctiva/Sclera Injection, nasal pterygium 1+ Injection, nasal Pinguecula   Cornea Nasal ptergyium extending 2.2mm onto nasal cornea 1+Punctate epithelial erosions   Anterior Chamber Deep, clear, narrow temporal angle deep, narrow temporal angle, 1+pigment   Iris Round and dilated Round and dilated   Lens 2+ Nuclear sclerosis, 2+ Cortical cataract 2+ Nuclear sclerosis, 2+ Cortical cataract   Vitreous Mild Vitreous syneresis, Posterior vitreous detachment, vitreous condensations Mild Vitreous syneresis       Fundus Exam      Right Left   Disc Pink and Sharp, +cupping Pink and Sharp, +cupping   C/D Ratio 0.65 0.6   Macula Flat, Blunted foveal reflex, interval recurrence of SRF, RPE mottling, clumping and atrophy, No heme  Flat, good foveal reflex, Epiretinal membrane, mild RPE mottling superiorly, No heme or edema   Vessels Vascular attenuation, Tortuous Vascular attenuation, mild Tortuousity   Periphery Attached, bullous Retinoschisis cavity from 0700-0830 -- no associated RT/RD - stable from prior Attached, shallow retinoschisis cavity peripheral IT quadrant -- stable from prior          IMAGING AND PROCEDURES  Imaging and Procedures for @TODAY @  OCT, Retina - OU - Both Eyes       Right Eye Quality was good.  Central Foveal Thickness: 254. Progression has worsened. Findings include normal foveal contour, no IRF, outer retinal atrophy, vitreomacular adhesion  (Interval re-development of SRF with ratty photoreceptors nasal fovea, no IRF).   Left Eye Quality was good. Central Foveal Thickness: 295. Progression has  been stable. Findings include normal foveal contour, no IRF, no SRF, vitreomacular adhesion .   Notes *Images captured and stored on drive  Diagnosis / Impression:  [id number] re-development of SRF with ratty photoreceptors nasal fovea, no IRF; persistent IT quad retinoschisis cavity caught on widefield OS: NFP, no IRF/SRF  Clinical management:  See below  Abbreviations: NFP - Normal foveal profile. CME - cystoid macular edema. PED - pigment epithelial detachment. IRF - intraretinal fluid. SRF - subretinal fluid. EZ - ellipsoid zone. ERM - epiretinal membrane. ORA - outer retinal atrophy. ORT - outer retinal tubulation. 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 Retinal edema  H35.81 OCT, Retina - OU - Both Eyes  3. 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 Hypertensive retinopathy of both eyes  H35.033   6. Essential hypertension  I10   7. 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 CSCR OD -- likely chronic  - pt reports decreased vision OD since December 2019  - reports significant stressors in life  - denies steroid use  - FA (09.30.20) shows focal hyperfluorescent leakage points nasal macula OD -- expansile dot phenotype?  - on 50 mg po eplerenone since 9.30.20  -- pt has not taken since February - self D/C'd -- will re-start  - BCVA decreased to 2070 from 20/60 OD  - OCT shows interval recurrence of SRF and  persistent ORA centrally  - differential includes atypical exudative ARMD  - restart po eplerenone 50 mg daily -- discussed importance of adherence to medication   - f/u 6 weeks, sooner prn -- DFE/OCT  4. Retinoschisis OU (OD > OS) -- stable  - bullous retinoschisis cavity IT quadrant OD-- 0240-9735 oclock  - shallow retinoschisis cavity IT quadrant OS  - no associated retinal holes/RT/RD on scleral depression  - discussed findings and prognosis  - recommend observation -- consider laser retinopexy if schisis progresses or threatens macula or develops RD  5,6. Hypertensive retinopathy OU  - discussed importance of tight BP control  - monitor  7. 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  - discussed diagnosis and progression  - not yet visually significant  - monitor for now   Ophthalmic Meds Ordered this visit:  Meds ordered this encounter  Medications  . eplerenone (INSPRA) 50 MG tablet    Sig: Take 1 tablet (50 mg total) by mouth daily.    Dispense:  30 tablet    Refill:  11       Return in about 6 weeks (around 04/30/2020) for f/u CSR OD, DFE, OCT.  There are no Patient Instructions on file for this visit.   Explained the diagnoses, plan, and follow up with the patient and they expressed understanding.  Patient expressed understanding of the importance of proper follow up care.   This document serves as a record of services personally performed by Gardiner Sleeper, MD, PhD. It was created on their behalf by Ernest Mallick, OA, an ophthalmic assistant. The creation of this record is the provider's dictation and/or activities during the visit.    Electronically signed  by: Ernest Mallick, OA 06.09.2021 10:29 PM  Gardiner Sleeper, M.D., Ph.D. Diseases & Surgery of the Retina and [place] 03/19/2020   I have reviewed the above documentation for  accuracy and completeness, and I agree with the above. Gardiner Sleeper, M.D., Ph.D. 03/25/20 10:29 PM    Abbreviations: M myopia (nearsighted); A astigmatism; H hyperopia (farsighted); P presbyopia; Mrx spectacle prescription;  CTL contact lenses; OD right eye; OS left eye; OU both eyes  XT exotropia; ET esotropia; PEK punctate epithelial keratitis; PEE punctate epithelial erosions; DES dry eye syndrome; MGD meibomian gland dysfunction; ATs artificial tears; PFAT's preservative free artificial tears; [place] nuclear sclerotic cataract; PSC posterior subcapsular cataract; ERM epi-retinal membrane; PVD posterior vitreous detachment; RD retinal detachment; DM diabetes mellitus; DR diabetic retinopathy; NPDR non-proliferative diabetic retinopathy; PDR proliferative diabetic retinopathy; CSME clinically significant macular edema; DME diabetic macular edema; dbh dot blot hemorrhages; CWS cotton wool spot; POAG primary open angle glaucoma; C/D cup-to-disc ratio; HVF humphrey visual field; GVF goldmann visual field; OCT optical coherence tomography; IOP intraocular pressure; BRVO Branch retinal vein occlusion; CRVO central retinal vein occlusion; CRAO central retinal artery occlusion; BRAO branch retinal artery occlusion; RT retinal tear; SB scleral buckle; PPV pars plana vitrectomy; VH Vitreous hemorrhage; PRP panretinal laser photocoagulation; IVK intravitreal kenalog; VMT vitreomacular traction; MH Macular hole;  NVD neovascularization of the disc; NVE neovascularization elsewhere; AREDS age related eye disease study; ARMD age related macular degeneration; POAG primary open angle glaucoma; EBMD epithelial/anterior basement membrane dystrophy; ACIOL anterior chamber intraocular lens; IOL intraocular lens; PCIOL posterior chamber intraocular lens; Phaco/IOL phacoemulsification with intraocular lens placement; [place] photorefractive keratectomy; LASIK laser assisted in situ keratomileusis; HTN hypertension; DM diabetes  mellitus; COPD chronic obstructive pulmonary disease

## 2020-03-19 ENCOUNTER — Ambulatory Visit (INDEPENDENT_AMBULATORY_CARE_PROVIDER_SITE_OTHER): Payer: Commercial Managed Care - PPO | Admitting: Ophthalmology

## 2020-03-19 ENCOUNTER — Other Ambulatory Visit: Payer: Self-pay

## 2020-03-19 ENCOUNTER — Encounter (INDEPENDENT_AMBULATORY_CARE_PROVIDER_SITE_OTHER): Payer: Self-pay | Admitting: Ophthalmology

## 2020-03-19 DIAGNOSIS — H25813 Combined forms of age-related cataract, bilateral: Secondary | ICD-10-CM

## 2020-03-19 DIAGNOSIS — H35711 Central serous chorioretinopathy, right eye: Secondary | ICD-10-CM | POA: Diagnosis not present

## 2020-03-19 DIAGNOSIS — H353211 Exudative age-related macular degeneration, right eye, with active choroidal neovascularization: Secondary | ICD-10-CM

## 2020-03-19 DIAGNOSIS — H3581 Retinal edema: Secondary | ICD-10-CM

## 2020-03-19 DIAGNOSIS — I10 Essential (primary) hypertension: Secondary | ICD-10-CM

## 2020-03-19 DIAGNOSIS — H35033 Hypertensive retinopathy, bilateral: Secondary | ICD-10-CM

## 2020-03-19 DIAGNOSIS — H33103 Unspecified retinoschisis, bilateral: Secondary | ICD-10-CM

## 2020-03-19 MED ORDER — EPLERENONE 50 MG PO TABS: 50.0000 mg | ORAL_TABLET | Freq: Every day | ORAL | 11 refills | Status: DC

## 2020-03-25 ENCOUNTER — Encounter (INDEPENDENT_AMBULATORY_CARE_PROVIDER_SITE_OTHER): Payer: Self-pay | Admitting: Ophthalmology

## 2020-04-29 NOTE — Progress Notes (Addendum)
[facility] Note  04/30/2020     CHIEF COMPLAINT Patient presents for Retina Follow Up   HISTORY OF PRESENT ILLNESS: [name] is a 49 y.o. female who presents to the clinic today for:   HPI    Retina Follow Up    Patient presents with  Other.  In right eye.  This started weeks ago.  Severity is moderate.  Duration of weeks.  Since onset it is stable.  I, the attending physician,  performed the HPI with the patient and updated documentation appropriately.          Comments    Pt states vision is about the same.  Patient denies eye pain or discomfort and denies any new or worsening floaters or fol OU.       Last edited by [name], MD on 04/30/2020  1:55 PM. (History)    pt feels like her vision is the same, she has been taking epleronone and has not missed any doses  Referring physician: Shawnie Dapper, DO [address] [place],  [place] [id number]  HISTORICAL INFORMATION:   Selected notes from the MEDICAL RECORD NUMBER Referred by [name] for concern of sub-macular fluid / retinoschisis   CURRENT MEDICATIONS: No current outpatient medications on file. (Ophthalmic Drugs)   No current facility-administered medications for this visit. (Ophthalmic Drugs)   Current Outpatient Medications (Other)  Medication Sig  . eplerenone (INSPRA) 50 MG tablet Take 1 tablet (50 mg total) by mouth daily.  Marland Kitchen FLUoxetine (PROZAC) 20 MG capsule Take 20 mg by mouth daily.  . hydrochlorothiazide (HYDRODIURIL) 25 MG tablet TAKE 1 TABLET BY MOUTH ONCE DAILY IN THE MORNING FOR 90 DAYS  . lisinopril (PRINIVIL,ZESTRIL) 20 MG tablet Take 1 tablet (20 mg total) by mouth daily.   No current facility-administered medications for this visit. (Other)      REVIEW OF SYSTEMS: ROS    Positive for: Eyes   Negative for: Constitutional, Gastrointestinal, Neurological, Skin, Genitourinary, Musculoskeletal, HENT, Endocrine, Cardiovascular, Respiratory,  Psychiatric, Allergic/Imm, Heme/Lymph   Last edited by [name] on 04/30/2020  1:04 PM. (History)       ALLERGIES No Known Allergies  PAST MEDICAL HISTORY Past Medical History:  Diagnosis Date  . Cataract    Mixed form OU  . CIN I (cervical intraepithelial neoplasia I)   . Depression   . High risk HPV infection   . Hypertension   . Hypertensive retinopathy    OU  . Missed ab AGUST 6 2014  . Retina disorder 10/22/2019   pt states taking inspra for excess fluid in retina  . Sciatic nerve injury    Past Surgical History:  Procedure Laterality Date  . MYOMECTOMY  2008    FAMILY HISTORY Family History  Problem Relation Age of Onset  . Hypertension Mother   . Diabetes Father   . Diabetes Maternal Grandmother   . Hypertension Maternal Grandmother   . Colon cancer Neg Hx   . Colon polyps Neg Hx   . Esophageal cancer Neg Hx   . Rectal cancer Neg Hx   . Stomach cancer Neg Hx     SOCIAL HISTORY Social History   Tobacco Use  . Smoking status: Never Smoker  . Smokeless tobacco: Never Used  Vaping Use  . Vaping Use: Never used  Substance Use Topics  . Alcohol use: No    Alcohol/week: 0.0 standard drinks  . Drug use: No  OPHTHALMIC EXAM:  Base Eye Exam    Visual Acuity (Snellen - Linear)      Right Left   Dist cc 20/70 +1 20/20 -2   Dist ph cc NI        Tonometry (Tonopen, 1:08 PM)      Right Left   Pressure 20 16       Pupils      Dark Light Shape React APD   Right 5 4 Round Brisk 0   Left 5 4 Round Brisk 0       Visual Fields      Left Right    Full Full       Extraocular Movement      Right Left    Full Full       Neuro/Psych    Oriented x3: Yes   Mood/Affect: Normal       Dilation    Both eyes: 1.0% Mydriacyl, 2.5% Phenylephrine @ 1:08 PM        Slit Lamp and Fundus Exam    Slit Lamp Exam      Right Left   Lids/Lashes Dermatochalasis - upper lid Dermatochalasis - upper lid   Conjunctiva/Sclera Injection,  nasal pterygium 1+ Injection, nasal Pinguecula   Cornea Nasal ptergyium extending 2.69mm onto nasal cornea 1+Punctate epithelial erosions   Anterior Chamber Deep, clear, narrow temporal angle, No cell or flare deep, narrow temporal angle, 1+pigment   Iris Round and dilated Round and dilated   Lens 2+ Nuclear sclerosis, 2+ Cortical cataract 2+ Nuclear sclerosis, 2+ Cortical cataract   Vitreous Mild Vitreous syneresis, Posterior vitreous detachment, vitreous condensations Mild Vitreous syneresis       Fundus Exam      Right Left   Disc Pink and Sharp, +cupping Pink and Sharp, +cupping   C/D Ratio 0.65 0.6   Macula Flat, Blunted foveal reflex, mild interval increase in SRF, RPE mottling, clumping and atrophy, No heme  Flat, good foveal reflex, Epiretinal membrane, mild RPE mottling superiorly, No heme or edema   Vessels Vascular attenuation, Tortuous Vascular attenuation, mild Tortuousity   Periphery Attached, bullous Retinoschisis cavity from 0700-0830 -- no associated RT/RD - stable from prior, mild peripheral cystoid degeneration Attached, shallow retinoschisis cavity peripheral IT quadrant -- stable from prior          IMAGING AND PROCEDURES  Imaging and Procedures for @TODAY @  OCT, Retina - OU - Both Eyes       Right Eye Quality was good. Central Foveal Thickness: 291. Progression has worsened. Findings include normal foveal contour, no IRF, outer retinal atrophy, vitreomacular adhesion  (Interval increase in SRF with ratty photoreceptors nasal fovea, no IRF).   Left Eye Quality was good. Central Foveal Thickness: 301. Progression has been stable. Findings include normal foveal contour, no IRF, no SRF, vitreomacular adhesion .   Notes *Images captured and stored on drive  Diagnosis / Impression:  [id number] increase in SRF with ratty photoreceptors nasal fovea, no IRF OS: NFP, no IRF/SRF  Clinical management:  See below  Abbreviations: NFP - Normal foveal profile. CME -  cystoid macular edema. PED - pigment epithelial detachment. IRF - intraretinal fluid. SRF - subretinal fluid. EZ - ellipsoid zone. ERM - epiretinal membrane. ORA - outer retinal atrophy. ORT - outer retinal tubulation. 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 Exudative age-related macular degeneration of right eye with active choroidal neovascularization ([place])  H35.3211   3. 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 Hypertensive retinopathy of both eyes  H35.033   6. Essential hypertension  I10   7. 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 CSCR OD -- likely chronic  - pt reports decreased vision OD since December 2019  - reports significant stressors in life  - denies steroid use  - FA (09.30.20) shows focal hyperfluorescent leakage points nasal macula OD -- expansile dot phenotype?  - on 50 mg po eplerenone since 9.30.20  -- pt self d/c in February, re-started in June  - BCVA stable at 20/70 OD  - OCT shows interval increase in SRF and persistent ORA centrally  - differential includes atypical exudative ARMD  - discussed possible IVA  - recommend cont po eplerenone 50 mg daily for now and addition of IVA if   - f/u 6 weeks, sooner prn -- DFE/OCT  4. Retinoschisis OU (OD > OS) -- stable  - bullous retinoschisis cavity IT quadrant OD-- 6063-0160 oclock  - shallow retinoschisis cavity IT quadrant OS  - no associated retinal holes/RT/RD on scleral depression  - discussed findings and prognosis  - recommend observation -- consider laser retinopexy if schisis progresses or threatens macula or develops RD  5,6. Hypertensive retinopathy OU  - discussed importance of tight BP control  - monitor  7. Mixed form age related cataracts OU  - The symptoms of cataract, surgical options, and  treatments and risks were discussed with patient.  - discussed diagnosis and progression  - not yet visually significant  - monitor for now   Ophthalmic Meds Ordered this visit:  No orders of the defined types were placed in this encounter.      Return in about 6 weeks (around 06/11/2020) for f/u CSR OD, DFE, OCT.  There are no Patient Instructions on file for this visit.   Explained the diagnoses, plan, and follow up with the patient and they expressed understanding.  Patient expressed understanding of the importance of proper follow up care.   This document serves as a record of services personally performed by Gardiner Sleeper, MD, PhD. It was created on their behalf by Roselee Nova, COMT. The creation of this record is the provider's dictation and/or activities during the visit.  Electronically signed by: Roselee Nova, COMT 04/30/20 1:59 PM  Gardiner Sleeper, M.D., Ph.D. Diseases & Surgery of the Retina and Vitreous [facility]  I have reviewed the above documentation for accuracy and completeness, and I agree with the above. Gardiner Sleeper, M.D., Ph.D. 04/30/20 1:59 PM   Abbreviations: M myopia (nearsighted); A astigmatism; H hyperopia (farsighted); P presbyopia; Mrx spectacle prescription;  CTL contact lenses; OD right eye; OS left eye; OU both eyes  XT exotropia; ET esotropia; PEK punctate epithelial keratitis; PEE punctate epithelial erosions; DES dry eye syndrome; MGD meibomian gland dysfunction; ATs artificial tears; PFAT's preservative free artificial tears; [place] nuclear sclerotic cataract; PSC posterior subcapsular cataract; ERM epi-retinal membrane; PVD posterior vitreous detachment; RD retinal detachment; DM diabetes mellitus; DR diabetic retinopathy; NPDR non-proliferative diabetic retinopathy; PDR proliferative diabetic retinopathy; CSME clinically significant macular edema; DME diabetic macular edema; dbh dot blot hemorrhages; CWS cotton wool spot;  POAG primary open angle glaucoma; C/D cup-to-disc ratio; HVF humphrey  visual field; GVF goldmann visual field; OCT optical coherence tomography; IOP intraocular pressure; BRVO Branch retinal vein occlusion; CRVO central retinal vein occlusion; CRAO central retinal artery occlusion; BRAO branch retinal artery occlusion; RT retinal tear; SB scleral buckle; PPV pars plana vitrectomy; VH Vitreous hemorrhage; PRP panretinal laser photocoagulation; IVK intravitreal kenalog; VMT vitreomacular traction; MH Macular hole;  NVD neovascularization of the disc; NVE neovascularization elsewhere; AREDS age related eye disease study; ARMD age related macular degeneration; POAG primary open angle glaucoma; EBMD epithelial/anterior basement membrane dystrophy; ACIOL anterior chamber intraocular lens; IOL intraocular lens; PCIOL posterior chamber intraocular lens; Phaco/IOL phacoemulsification with intraocular lens placement; [place] photorefractive keratectomy; LASIK laser assisted in situ keratomileusis; HTN hypertension; DM diabetes mellitus; COPD chronic obstructive pulmonary disease

## 2020-04-30 ENCOUNTER — Ambulatory Visit (INDEPENDENT_AMBULATORY_CARE_PROVIDER_SITE_OTHER): Payer: Commercial Managed Care - PPO | Admitting: Ophthalmology

## 2020-04-30 ENCOUNTER — Encounter (INDEPENDENT_AMBULATORY_CARE_PROVIDER_SITE_OTHER): Payer: Self-pay | Admitting: Ophthalmology

## 2020-04-30 ENCOUNTER — Other Ambulatory Visit: Payer: Self-pay

## 2020-04-30 DIAGNOSIS — H25813 Combined forms of age-related cataract, bilateral: Secondary | ICD-10-CM

## 2020-04-30 DIAGNOSIS — I10 Essential (primary) hypertension: Secondary | ICD-10-CM

## 2020-04-30 DIAGNOSIS — H353211 Exudative age-related macular degeneration, right eye, with active choroidal neovascularization: Secondary | ICD-10-CM | POA: Diagnosis not present

## 2020-04-30 DIAGNOSIS — H33103 Unspecified retinoschisis, bilateral: Secondary | ICD-10-CM

## 2020-04-30 DIAGNOSIS — H35033 Hypertensive retinopathy, bilateral: Secondary | ICD-10-CM

## 2020-04-30 DIAGNOSIS — H3581 Retinal edema: Secondary | ICD-10-CM | POA: Diagnosis not present

## 2020-04-30 DIAGNOSIS — H35711 Central serous chorioretinopathy, right eye: Secondary | ICD-10-CM

## 2020-06-10 NOTE — Progress Notes (Signed)
[facility] Note  06/11/2020     CHIEF COMPLAINT Patient presents for Retina Follow Up   HISTORY OF PRESENT ILLNESS: [name] is a 49 y.o. female who presents to the clinic today for:   HPI    Retina Follow Up    Patient presents with  Other.  In right eye.  This started months ago.  Severity is moderate.  Duration of 6 weeks.  Since onset it is stable.  I, the attending physician,  performed the HPI with the patient and updated documentation appropriately.          Comments    49 y/o female pt here for 6 wk f/u for CSR OD.  No change in [place] OU.  Denies pain, FOL, floaters.  No gtts.  Eplerenone 50mg  daily PO.       Last edited by [name], MD on 06/11/2020 11:22 PM. (History)    pt states she has been taking epleronone every day, but her vision has not improved, pt states she has been under a lot of stress the past few days  Referring physician: Shawnie Dapper, DO [address],  [place] [address]  HISTORICAL INFORMATION:   Selected notes from the MEDICAL RECORD NUMBER Referred by [name] for concern of sub-macular fluid / retinoschisis   CURRENT MEDICATIONS: No current outpatient medications on file. (Ophthalmic Drugs)   No current facility-administered medications for this visit. (Ophthalmic Drugs)   Current Outpatient Medications (Other)  Medication Sig   Cholecalciferol 1.25 MG (50000 UT) capsule cholecalciferol (vitamin D3) 1,250 mcg (50,000 unit) capsule  TAKE 1 CAPSULE BY MOUTH ONCE A WEEK   eplerenone (INSPRA) 50 MG tablet Take 1 tablet (50 mg total) by mouth daily.   FLUoxetine (PROZAC) 20 MG capsule Take 20 mg by mouth daily.   hydrochlorothiazide (HYDRODIURIL) 25 MG tablet TAKE 1 TABLET BY MOUTH ONCE DAILY IN THE MORNING FOR 90 DAYS   lisinopril (PRINIVIL,ZESTRIL) 20 MG tablet Take 1 tablet (20 mg total) by mouth daily.   terbinafine (LAMISIL) 250 MG tablet terbinafine HCl 250 mg tablet  TAKE 1  TABLET BY MOUTH ONCE DAILY FOR 90 DAYS   No current facility-administered medications for this visit. (Other)      REVIEW OF SYSTEMS: ROS    Positive for: Eyes   Negative for: Constitutional, Gastrointestinal, Neurological, Skin, Genitourinary, Musculoskeletal, HENT, Endocrine, Cardiovascular, Respiratory, Psychiatric, Allergic/Imm, Heme/Lymph   Last edited by Matthew Folks, COA on 06/11/2020  1:18 PM. (History)       ALLERGIES No Known Allergies  PAST MEDICAL HISTORY Past Medical History:  Diagnosis Date   Cataract    Mixed form OU   CIN I (cervical intraepithelial neoplasia I)    Depression    High risk HPV infection    Hypertension    Hypertensive retinopathy    OU   Missed ab AGUST 6 2014   Retina disorder 10/22/2019   pt states taking inspra for excess fluid in retina   Sciatic nerve injury    Past Surgical History:  Procedure Laterality Date   MYOMECTOMY  2008    FAMILY HISTORY Family History  Problem Relation Age of Onset   Hypertension Mother    Diabetes Father    Diabetes Maternal Grandmother    Hypertension Maternal Grandmother    Colon cancer Neg Hx    Colon polyps Neg Hx    Esophageal cancer Neg Hx    Rectal cancer Neg  Hx    Stomach cancer Neg Hx     SOCIAL HISTORY Social History   Tobacco Use   Smoking status: Never Smoker   Smokeless tobacco: Never Used  Vaping Use   Vaping Use: Never used  Substance Use Topics   Alcohol use: No    Alcohol/week: 0.0 standard drinks   Drug use: No         OPHTHALMIC EXAM:  Base Eye Exam    Visual Acuity (Snellen - Linear)      Right Left   Dist cc 20/70 20/20 -2   Dist ph cc NI    Correction: Glasses       Tonometry (Tonopen, 1:20 PM)      Right Left   Pressure 20 17       Pupils      Dark Light Shape React APD   Right 4 3 Round Brisk None   Left 4 3 Round Brisk None       Visual Fields (Counting fingers)      Left Right    Full Full        Extraocular Movement      Right Left    Full, Ortho Full, Ortho       Neuro/Psych    Oriented x3: Yes   Mood/Affect: Normal       Dilation    Both eyes: 1.0% Mydriacyl, 2.5% Phenylephrine @ 1:20 PM        Slit Lamp and Fundus Exam    Slit Lamp Exam      Right Left   Lids/Lashes Dermatochalasis - upper lid Dermatochalasis - upper lid   Conjunctiva/Sclera Injection, nasal pterygium 1+ Injection, nasal Pinguecula   Cornea Nasal ptergyium extending 2.21mm onto nasal cornea 1+Punctate epithelial erosions   Anterior Chamber Deep, clear, narrow temporal angle, No cell or flare deep, narrow temporal angle, 1+pigment   Iris Round and dilated Round and dilated   Lens 2+ Nuclear sclerosis, 2+ Cortical cataract 2+ Nuclear sclerosis, 2+ Cortical cataract   Vitreous Mild Vitreous syneresis, Posterior vitreous detachment, vitreous condensations Mild Vitreous syneresis       Fundus Exam      Right Left   Disc Pink and Sharp, +cupping Pink and Sharp, +cupping   C/D Ratio 0.65 0.6   Macula Flat, Blunted foveal reflex, mild interval increase in SRF nasal macula, RPE mottling, clumping and atrophy, No heme  Flat, good foveal reflex, Epiretinal membrane, mild RPE mottling and clumping superiorly, No heme or edema   Vessels Vascular attenuation, mild Tortuousity Vascular attenuation, mild Tortuousity   Periphery Attached, bullous Retinoschisis cavity from 0700-0830 -- no associated RT/RD - stable from prior, mild peripheral cystoid degeneration Attached, shallow retinoschisis cavity peripheral IT quadrant -- stable from prior          IMAGING AND PROCEDURES  Imaging and Procedures for @TODAY @  OCT, Retina - OU - Both Eyes       Right Eye Quality was good. Central Foveal Thickness: 262. Progression has worsened. Findings include normal foveal contour, no IRF, outer retinal atrophy, vitreomacular adhesion  (Interval increase in SRF with ratty photoreceptors nasal fovea, no IRF; IT schisis  caught on widefield).   Left Eye Quality was good. Central Foveal Thickness: 299. Progression has been stable. Findings include normal foveal contour, no IRF, no SRF, vitreomacular adhesion .   Notes *Images captured and stored on drive  Diagnosis / Impression:  OD: Interval increase in SRF with ratty photoreceptors nasal fovea, no IRF  OS: NFP, no IRF/SRF  Clinical management:  See below  Abbreviations: NFP - Normal foveal profile. CME - cystoid macular edema. PED - pigment epithelial detachment. IRF - intraretinal fluid. SRF - subretinal fluid. EZ - ellipsoid zone. ERM - epiretinal membrane. ORA - outer retinal atrophy. ORT - outer retinal tubulation. SRHM - subretinal hyper-reflective material        Intravitreal Injection, Pharmacologic Agent - OD - Right Eye       Time Out 06/11/2020. 2:10 PM. Confirmed correct patient, procedure, site, and patient consented.   Anesthesia Topical anesthesia was used. Anesthetic medications included Lidocaine 2%, Proparacaine 0.5%.   Procedure Preparation included 5% betadine to ocular surface, eyelid speculum. A (32g) needle was used.   Injection:  1.25 mg Bevacizumab (AVASTIN) SOLN   NDC: 70263-785-88, Lot: [id number], Expiration date: 06/23/2020   Route: Intravitreal, Site: Right Eye, Waste: 0.05 mL  Post-op Post injection exam found visual acuity of at least counting fingers. The patient tolerated the procedure well. There were no complications. The patient received written and verbal post procedure care education. Post injection medications were not given.                 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 Exudative age-related macular degeneration of right eye with active choroidal neovascularization (HCC)  H35.3211 Intravitreal Injection, Pharmacologic Agent - OD - Right Eye    Bevacizumab (AVASTIN) SOLN 1.25 mg  3. Retinal edema  H35.81 OCT, Retina - OU - Both Eyes   4. 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 Essential hypertension  I10   6. Hypertensive retinopathy of both eyes  H35.033   7. 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 CSCR OD -- likely chronic  - pt reports decreased vision OD since December 2019  - reports significant stressors in life  - denies steroid use  - FA (09.30.20) shows focal hyperfluorescent leakage points nasal macula OD -- expansile dot phenotype?  - cont 50 mg po eplerenone since 09.30.20  -- pt self d/c in February, re-started in June  - BCVA stable at 20/70 OD  - OCT shows interval increase in SRF and persistent ORA centrally  - differential includes atypical exudative ARMD  - discussed possible IVA -- pt wishes to proceed with injection today  - recommend IVA OD #1  - RBA of procedure discussed, questions answered  - informed consent obtained and signed  - see procedure note  - recommend cont po eplerenone 50 mg daily  - f/u 4 weeks -- DFE/OCT, possible injection  4. Retinoschisis OU (OD > OS) -- stable  - bullous retinoschisis cavity IT quadrant OD-- 2878-6767 oclock  - shallow retinoschisis cavity IT quadrant OS  - no associated retinal holes/RT/RD on scleral depression  - discussed findings and prognosis  - recommend observation -- consider laser retinopexy if schisis progresses or threatens macula or develops RD  5,6. Hypertensive retinopathy OU  - discussed importance of tight BP control  - monitor  7. 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  - discussed diagnosis and progression  - not yet visually significant  - monitor for now   Ophthalmic Meds Ordered this visit:  Meds ordered this encounter  Medications   Bevacizumab (AVASTIN) SOLN 1.25 mg  Return in about 4 weeks (around 07/09/2020) for f/u CSR OD, DFE, OCT.  There are no Patient Instructions on file for  this visit.   Explained the diagnoses, plan, and follow up with the patient and they expressed understanding.  Patient expressed understanding of the importance of proper follow up care.   This document serves as a record of services personally performed by Gardiner Sleeper, MD, PhD. It was created on their behalf by Roselee Nova, COMT. The creation of this record is the provider's dictation and/or activities during the visit.  Electronically signed by: Roselee Nova, COMT 06/11/20 11:26 PM   This document serves as a record of services personally performed by Gardiner Sleeper, MD, PhD. It was created on their behalf by San Jetty. Owens Shark, OA an ophthalmic technician. The creation of this record is the provider's dictation and/or activities during the visit.    Electronically signed by: San Jetty. Owens Shark, [place] 09.01.2021 11:26 PM  Gardiner Sleeper, M.D., Ph.D. Diseases & Surgery of the Retina and Vitreous [facility]  I have reviewed the above documentation for accuracy and completeness, and I agree with the above. Gardiner Sleeper, M.D., Ph.D. 06/11/20 11:26 PM   Abbreviations: M myopia (nearsighted); A astigmatism; H hyperopia (farsighted); P presbyopia; Mrx spectacle prescription;  CTL contact lenses; OD right eye; OS left eye; OU both eyes  XT exotropia; ET esotropia; PEK punctate epithelial keratitis; PEE punctate epithelial erosions; DES dry eye syndrome; MGD meibomian gland dysfunction; ATs artificial tears; PFAT's preservative free artificial tears; [place] nuclear sclerotic cataract; PSC posterior subcapsular cataract; ERM epi-retinal membrane; PVD posterior vitreous detachment; RD retinal detachment; DM diabetes mellitus; DR diabetic retinopathy; NPDR non-proliferative diabetic retinopathy; PDR proliferative diabetic retinopathy; CSME clinically significant macular edema; DME diabetic macular edema; dbh dot blot hemorrhages; CWS cotton wool spot; POAG primary open angle  glaucoma; C/D cup-to-disc ratio; HVF humphrey visual field; GVF goldmann visual field; OCT optical coherence tomography; IOP intraocular pressure; BRVO Branch retinal vein occlusion; CRVO central retinal vein occlusion; CRAO central retinal artery occlusion; BRAO branch retinal artery occlusion; RT retinal tear; SB scleral buckle; PPV pars plana vitrectomy; VH Vitreous hemorrhage; PRP panretinal laser photocoagulation; IVK intravitreal kenalog; VMT vitreomacular traction; MH Macular hole;  NVD neovascularization of the disc; NVE neovascularization elsewhere; AREDS age related eye disease study; ARMD age related macular degeneration; POAG primary open angle glaucoma; EBMD epithelial/anterior basement membrane dystrophy; ACIOL anterior chamber intraocular lens; IOL intraocular lens; PCIOL posterior chamber intraocular lens; Phaco/IOL phacoemulsification with intraocular lens placement; [place] photorefractive keratectomy; LASIK laser assisted in situ keratomileusis; HTN hypertension; DM diabetes mellitus; COPD chronic obstructive pulmonary disease

## 2020-06-11 ENCOUNTER — Encounter (INDEPENDENT_AMBULATORY_CARE_PROVIDER_SITE_OTHER): Payer: Self-pay | Admitting: Ophthalmology

## 2020-06-11 ENCOUNTER — Ambulatory Visit (INDEPENDENT_AMBULATORY_CARE_PROVIDER_SITE_OTHER): Payer: Commercial Managed Care - PPO | Admitting: Ophthalmology

## 2020-06-11 ENCOUNTER — Other Ambulatory Visit: Payer: Self-pay

## 2020-06-11 DIAGNOSIS — H353211 Exudative age-related macular degeneration, right eye, with active choroidal neovascularization: Secondary | ICD-10-CM

## 2020-06-11 DIAGNOSIS — H3581 Retinal edema: Secondary | ICD-10-CM

## 2020-06-11 DIAGNOSIS — I10 Essential (primary) hypertension: Secondary | ICD-10-CM

## 2020-06-11 DIAGNOSIS — H33103 Unspecified retinoschisis, bilateral: Secondary | ICD-10-CM

## 2020-06-11 DIAGNOSIS — H35711 Central serous chorioretinopathy, right eye: Secondary | ICD-10-CM | POA: Diagnosis not present

## 2020-06-11 DIAGNOSIS — H35033 Hypertensive retinopathy, bilateral: Secondary | ICD-10-CM

## 2020-06-11 DIAGNOSIS — H25813 Combined forms of age-related cataract, bilateral: Secondary | ICD-10-CM

## 2020-06-11 MED ORDER — BEVACIZUMAB CHEMO INJECTION 1.25MG/0.05ML SYRINGE FOR KALEIDOSCOPE
1.2500 mg | INTRAVITREAL | Status: AC | PRN
  Administered 2020-06-11: 1.25 mg via INTRAVITREAL

## 2020-06-19 ENCOUNTER — Encounter: Payer: Self-pay | Admitting: Obstetrics & Gynecology

## 2020-06-19 ENCOUNTER — Other Ambulatory Visit: Payer: Self-pay

## 2020-06-19 ENCOUNTER — Ambulatory Visit: Payer: Commercial Managed Care - PPO | Admitting: Obstetrics & Gynecology

## 2020-06-19 DIAGNOSIS — R87612 Low grade squamous intraepithelial lesion on cytologic smear of cervix (LGSIL): Secondary | ICD-10-CM | POA: Diagnosis not present

## 2020-06-19 DIAGNOSIS — N870 Mild cervical dysplasia: Secondary | ICD-10-CM | POA: Diagnosis not present

## 2020-06-19 NOTE — Progress Notes (Signed)
    Jamell Opfer 09-20-1971 [id number]        49 y.o.  G1P0010   RP: Repeat Pap test  HPI: H/O CIN 1 in 2019.  Last Colpo 12/2019 No dysplasia and HPV 16-18-45 Negative.   OB History  Gravida Para Term Preterm AB Living  1 0     1 0  SAB TAB Ectopic Multiple Live Births  1            # Outcome Date GA Lbr Len/2nd Weight Sex Delivery Anes PTL Lv  1 SAB             Past medical history,surgical history, problem list, medications, allergies, family history and social history were all reviewed and documented in the EPIC chart.   Directed ROS with pertinent positives and negatives documented in the history of present illness/assessment and plan.  Exam:  There were no vitals filed for this visit. General appearance:  Normal  Gynecologic exam: Vulva normal.  Speculum:  Cervix/Vagina normal.  Pap/HPV HR done.   Assessment/Plan:  49 y.o. G1P0010   1. LGSIL on Pap smear of cervix Last Pap test January 2021 showed LGSIL.  Colposcopy March 2021 showed no dysplasia and HPV 16-18-45 were negative.  Repeat Pap test with high-risk HPV done today.  Management per results.  2. Dysplasia of cervix, low grade (CIN 1) History of CIN-1 in 2019.  High risk HPV positive.  Princess Bruins MD, 9:38 AM 06/19/2020

## 2020-06-19 NOTE — Addendum Note (Signed)
Addended by: Thurnell Garbe A on: 06/19/2020 09:57 AM   Modules accepted: Orders

## 2020-06-20 LAB — PAP, TP IMAGING W/ HPV RNA, RFLX HPV TYPE 16,18/45: HPV DNA High Risk: DETECTED — AB

## 2020-07-08 NOTE — Progress Notes (Signed)
[facility] Note  07/09/2020     CHIEF COMPLAINT Patient presents for Retina Follow Up   HISTORY OF PRESENT ILLNESS: [name] is a 49 y.o. female who presents to the clinic today for:   HPI    Retina Follow Up    Patient presents with  Other.  In right eye.  This started months ago.  Severity is moderate.  Duration of 4 weeks.  Since onset it is stable.  I, the attending physician,  performed the HPI with the patient and updated documentation appropriately.          Comments    49 y/o female pt here for 4 wk f/u for CSR OD.  No change in [place] OU.  Denies pain, FOL, floaters.  No gtts.  Responses aided by interpreter.       Last edited by [name], MD on 07/10/2020  1:49 AM. (History)    Pt still taking eplerenone  Referring physician: Kendall Flack, MD [place],  [place] [id number]  HISTORICAL INFORMATION:   Selected notes from the MEDICAL RECORD NUMBER Referred by [name] for concern of sub-macular fluid / retinoschisis   CURRENT MEDICATIONS: No current outpatient medications on file. (Ophthalmic Drugs)   No current facility-administered medications for this visit. (Ophthalmic Drugs)   Current Outpatient Medications (Other)  Medication Sig  . Cholecalciferol 1.25 MG (50000 UT) capsule cholecalciferol (vitamin D3) 1,250 mcg (50,000 unit) capsule  TAKE 1 CAPSULE BY MOUTH ONCE A WEEK  . eplerenone (INSPRA) 50 MG tablet Take 1 tablet (50 mg total) by mouth daily.  Marland Kitchen FLUoxetine (PROZAC) 20 MG capsule Take 20 mg by mouth daily.  . hydrochlorothiazide (HYDRODIURIL) 25 MG tablet TAKE 1 TABLET BY MOUTH ONCE DAILY IN THE MORNING FOR 90 DAYS  . lisinopril (PRINIVIL,ZESTRIL) 20 MG tablet Take 1 tablet (20 mg total) by mouth daily.  Marland Kitchen terbinafine (LAMISIL) 250 MG tablet terbinafine HCl 250 mg tablet  TAKE 1 TABLET BY MOUTH ONCE DAILY FOR 90 DAYS   No current facility-administered medications for this visit.  (Other)      REVIEW OF SYSTEMS: ROS    Positive for: Eyes   Negative for: Constitutional, Gastrointestinal, Neurological, Skin, Genitourinary, Musculoskeletal, HENT, Endocrine, Cardiovascular, Respiratory, Psychiatric, Allergic/Imm, Heme/Lymph   Last edited by Matthew Folks, COA on 07/09/2020  9:28 AM. (History)       ALLERGIES No Known Allergies  PAST MEDICAL HISTORY Past Medical History:  Diagnosis Date  . Cataract    Mixed form OU  . CIN I (cervical intraepithelial neoplasia I)   . Depression   . High risk HPV infection   . Hypertension   . Hypertensive retinopathy    OU  . Missed ab AGUST 6 2014  . Retina disorder 10/22/2019   pt states taking inspra for excess fluid in retina  . Sciatic nerve injury    Past Surgical History:  Procedure Laterality Date  . MYOMECTOMY  2008    FAMILY HISTORY Family History  Problem Relation Age of Onset  . Hypertension Mother   . Diabetes Father   . Diabetes Maternal Grandmother   . Hypertension Maternal Grandmother   . Colon cancer Neg Hx   . Colon polyps Neg Hx   . Esophageal cancer Neg Hx   . Rectal cancer Neg Hx   . Stomach cancer Neg Hx     SOCIAL HISTORY Social History   Tobacco Use  . Smoking status:  Never Smoker  . Smokeless tobacco: Never Used  Vaping Use  . Vaping Use: Never used  Substance Use Topics  . Alcohol use: No    Alcohol/week: 0.0 standard drinks  . Drug use: No         OPHTHALMIC EXAM:  Base Eye Exam    Visual Acuity (Snellen - Linear)      Right Left   Dist cc 20/80 -2 20/20 -2   Dist ph cc NI    Correction: Glasses       Tonometry (Tonopen, 9:29 AM)      Right Left   Pressure 18 18       Pupils      Dark Light Shape React APD   Right 4 3 Round Brisk None   Left 4 3 Round Brisk None       Visual Fields (Counting fingers)      Left Right    Full Full       Extraocular Movement      Right Left    Full, Ortho Full, Ortho       Neuro/Psych    Oriented x3: Yes    Mood/Affect: Normal       Dilation    Both eyes: 1.0% Mydriacyl, 2.5% Phenylephrine @ 9:29 AM        Slit Lamp and Fundus Exam    Slit Lamp Exam      Right Left   Lids/Lashes Dermatochalasis - upper lid Dermatochalasis - upper lid   Conjunctiva/Sclera Injection, nasal pterygium 1+ Injection, nasal Pinguecula   Cornea Nasal ptergyium extending 2.58mm onto nasal cornea 1+Punctate epithelial erosions   Anterior Chamber Deep, clear, narrow temporal angle, No cell or flare deep, narrow temporal angle, 1+pigment   Iris Round and dilated Round and dilated   Lens 2+ Nuclear sclerosis, 2+ Cortical cataract 2+ Nuclear sclerosis, 2+ Cortical cataract   Vitreous Mild Vitreous syneresis, Posterior vitreous detachment, vitreous condensations Mild Vitreous syneresis       Fundus Exam      Right Left   Disc Pink and Sharp, +cupping Pink and Sharp, +cupping   C/D Ratio 0.65 0.6   Macula Flat, Blunted foveal reflex, mild interval improvement in SRF nasal macula, RPE mottling, clumping and atrophy, No heme  Flat, good foveal reflex, Epiretinal membrane, mild RPE mottling and clumping superiorly, No heme or edema   Vessels Vascular attenuation, mild Tortuousity Vascular attenuation, mild Tortuousity   Periphery Attached, bullous Retinoschisis cavity from 0700-0830 -- no associated RT/RD - stable from prior, mild peripheral cystoid degeneration Attached, shallow retinoschisis cavity peripheral IT quadrant -- stable from prior          IMAGING AND PROCEDURES  Imaging and Procedures for @TODAY @  OCT, Retina - OU - Both Eyes       Right Eye Quality was good. Central Foveal Thickness: 266. Progression has improved. Findings include normal foveal contour, no IRF, outer retinal atrophy, vitreomacular adhesion  (Interval improvement in in SRF with ratty photoreceptors nasal fovea, new pocket of SRF subfoveal, IRF; IT schisis caught on widefield).   Left Eye Quality was good. Central Foveal Thickness:  297. Progression has been stable. Findings include normal foveal contour, no IRF, no SRF, vitreomacular adhesion .   Notes *Images captured and stored on drive  Diagnosis / Impression:  OD: Interval improvement in in SRF with ratty photoreceptors nasal fovea, new small pocket of SRF subfoveal, no IRF; IT schisis caught on widefield OS: NFP, no IRF/SRF, VMA  Clinical management:  See below  Abbreviations: NFP - Normal foveal profile. CME - cystoid macular edema. PED - pigment epithelial detachment. IRF - intraretinal fluid. SRF - subretinal fluid. EZ - ellipsoid zone. ERM - epiretinal membrane. ORA - outer retinal atrophy. ORT - outer retinal tubulation. SRHM - subretinal hyper-reflective material        Intravitreal Injection, Pharmacologic Agent - OD - Right Eye       Time Out 07/09/2020. 10:45 AM. Confirmed correct patient, procedure, site, and patient consented.   Anesthesia Topical anesthesia was used. Anesthetic medications included Lidocaine 2%, Proparacaine 0.5%.   Procedure Preparation included 5% betadine to ocular surface, eyelid speculum. A (32g) needle was used.   Injection:  1.25 mg Bevacizumab (AVASTIN) SOLN   NDC: 70360-001-02, Lot: [id number], Expiration date: 08/17/2020   Route: Intravitreal, Site: Right Eye, Waste: 0.05 mL  Post-op Post injection exam found visual acuity of at least counting fingers. The patient tolerated the procedure well. There were no complications. The patient received written and verbal post procedure care education. Post injection medications were not given.                 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 Exudative age-related macular degeneration of right eye with active choroidal neovascularization (HCC)  H35.3211 Intravitreal Injection, Pharmacologic Agent - OD - Right  Eye    Bevacizumab (AVASTIN) SOLN 1.25 mg  3. 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 Essential hypertension  I10   6. Hypertensive retinopathy of both eyes  H35.033   7. 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 CSCR OD -- likely chronic  - pt reports decreased vision OD since December 2019  - reports significant stressors in life  - denies steroid use  - FA (09.30.20) shows focal hyperfluorescent leakage points nasal macula OD -- expansile dot phenotype?  - cont 50 mg po eplerenone since 09.30.20  -- pt self d/c in February, re-started in June 2021  - s/p IVA #1 (09.01.21)  - BCVA slightly worse at 20/80 OD  - OCT shows Interval improvement in in SRF with ratty photoreceptors nasal fovea, new pocket of SRF subfoveal, IRF; IT schisis caught on widefield  - differential includes atypical exudative ARMD  - recommend IVA OD #2 today, 09.29.2021  - RBA of procedure discussed, questions answered  - informed consent obtained and signed 9.1.21  - see procedure note  - recommend cont po eplerenone 50 mg daily  - f/u 4 weeks -- DFE/OCT, possible injection  4. Retinoschisis OU (OD > OS) -- stable  - bullous retinoschisis cavity IT quadrant OD-- [id number] oclock  - shallow retinoschisis cavity IT quadrant OS  - no associated retinal holes/RT/RD on scleral depression  - discussed findings and prognosis  - recommend observation -- consider laser retinopexy if schisis progresses or threatens macula or develops RD  5,6. Hypertensive retinopathy OU  - discussed importance of tight BP control  - monitor  7. 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  - discussed diagnosis and progression  - not yet visually significant  - monitor for now   Ophthalmic Meds Ordered  this visit:  Meds ordered this encounter   Medications  . Bevacizumab (AVASTIN) SOLN 1.25 mg       Return in about 4 weeks (around 08/06/2020) for 4 wk f/u for CSCR OD w/DFE&OCT.  There are no Patient Instructions on file for this visit.   Explained the diagnoses, plan, and follow up with the patient and they expressed understanding.  Patient expressed understanding of the importance of proper follow up care.   This document serves as a record of services personally performed by Gardiner Sleeper, MD, PhD. It was created on their behalf by Estill Bakes, COT an ophthalmic technician. The creation of this record is the provider's dictation and/or activities during the visit.    Electronically signed by: Estill Bakes, COT 9.29.21 @ 1:55 AM   Gardiner Sleeper, M.D., Ph.D. Diseases & Surgery of the Retina and [place] 9.29.21  I have reviewed the above documentation for accuracy and completeness, and I agree with the above. Gardiner Sleeper, M.D., Ph.D. 07/10/20 1:55 AM    Abbreviations: M myopia (nearsighted); A astigmatism; H hyperopia (farsighted); P presbyopia; Mrx spectacle prescription;  CTL contact lenses; OD right eye; OS left eye; OU both eyes  XT exotropia; ET esotropia; PEK punctate epithelial keratitis; PEE punctate epithelial erosions; DES dry eye syndrome; MGD meibomian gland dysfunction; ATs artificial tears; PFAT's preservative free artificial tears; [place] nuclear sclerotic cataract; PSC posterior subcapsular cataract; ERM epi-retinal membrane; PVD posterior vitreous detachment; RD retinal detachment; DM diabetes mellitus; DR diabetic retinopathy; NPDR non-proliferative diabetic retinopathy; PDR proliferative diabetic retinopathy; CSME clinically significant macular edema; DME diabetic macular edema; dbh dot blot hemorrhages; CWS cotton wool spot; POAG primary open angle glaucoma; C/D cup-to-disc ratio; HVF humphrey visual field; GVF goldmann visual field; OCT optical coherence  tomography; IOP intraocular pressure; BRVO Branch retinal vein occlusion; CRVO central retinal vein occlusion; CRAO central retinal artery occlusion; BRAO branch retinal artery occlusion; RT retinal tear; SB scleral buckle; PPV pars plana vitrectomy; VH Vitreous hemorrhage; PRP panretinal laser photocoagulation; IVK intravitreal kenalog; VMT vitreomacular traction; MH Macular hole;  NVD neovascularization of the disc; NVE neovascularization elsewhere; AREDS age related eye disease study; ARMD age related macular degeneration; POAG primary open angle glaucoma; EBMD epithelial/anterior basement membrane dystrophy; ACIOL anterior chamber intraocular lens; IOL intraocular lens; PCIOL posterior chamber intraocular lens; Phaco/IOL phacoemulsification with intraocular lens placement; [place] photorefractive keratectomy; LASIK laser assisted in situ keratomileusis; HTN hypertension; DM diabetes mellitus; COPD chronic obstructive pulmonary disease

## 2020-07-09 ENCOUNTER — Other Ambulatory Visit: Payer: Self-pay

## 2020-07-09 ENCOUNTER — Encounter (INDEPENDENT_AMBULATORY_CARE_PROVIDER_SITE_OTHER): Payer: Self-pay | Admitting: Ophthalmology

## 2020-07-09 ENCOUNTER — Ambulatory Visit (INDEPENDENT_AMBULATORY_CARE_PROVIDER_SITE_OTHER): Payer: Commercial Managed Care - PPO | Admitting: Ophthalmology

## 2020-07-09 DIAGNOSIS — H33103 Unspecified retinoschisis, bilateral: Secondary | ICD-10-CM | POA: Diagnosis not present

## 2020-07-09 DIAGNOSIS — H353211 Exudative age-related macular degeneration, right eye, with active choroidal neovascularization: Secondary | ICD-10-CM | POA: Diagnosis not present

## 2020-07-09 DIAGNOSIS — I10 Essential (primary) hypertension: Secondary | ICD-10-CM

## 2020-07-09 DIAGNOSIS — H35711 Central serous chorioretinopathy, right eye: Secondary | ICD-10-CM

## 2020-07-09 DIAGNOSIS — H35033 Hypertensive retinopathy, bilateral: Secondary | ICD-10-CM

## 2020-07-09 DIAGNOSIS — H3581 Retinal edema: Secondary | ICD-10-CM | POA: Diagnosis not present

## 2020-07-09 DIAGNOSIS — H25813 Combined forms of age-related cataract, bilateral: Secondary | ICD-10-CM

## 2020-07-10 ENCOUNTER — Encounter (INDEPENDENT_AMBULATORY_CARE_PROVIDER_SITE_OTHER): Payer: Self-pay | Admitting: Ophthalmology

## 2020-07-10 DIAGNOSIS — H353211 Exudative age-related macular degeneration, right eye, with active choroidal neovascularization: Secondary | ICD-10-CM

## 2020-07-10 DIAGNOSIS — H35711 Central serous chorioretinopathy, right eye: Secondary | ICD-10-CM | POA: Diagnosis not present

## 2020-07-10 MED ORDER — BEVACIZUMAB CHEMO INJECTION 1.25MG/0.05ML SYRINGE FOR KALEIDOSCOPE
1.2500 mg | INTRAVITREAL | Status: AC | PRN
  Administered 2020-07-10: 1.25 mg via INTRAVITREAL

## 2020-08-08 ENCOUNTER — Encounter (INDEPENDENT_AMBULATORY_CARE_PROVIDER_SITE_OTHER): Payer: Commercial Managed Care - PPO | Admitting: Ophthalmology

## 2020-08-12 NOTE — Progress Notes (Signed)
[facility] Note  08/13/2020     CHIEF COMPLAINT Patient presents for Retina Follow Up   HISTORY OF PRESENT ILLNESS: [name] is a 49 y.o. female who presents to the clinic today for:   HPI    Retina Follow Up    Patient presents with  Other.  In right eye.  This started weeks ago.  Severity is moderate.  Duration of weeks.  Since onset it is stable.  I, the attending physician,  performed the HPI with the patient and updated documentation appropriately.          Comments    Pt states vision is the same OU.  Pt denies eye pain or discomfort and denies any new or worsening floaters or fol OU.       Last edited by [name], MD on 08/13/2020  2:28 PM. (History)    Pt still taking eplerenone  Referring physician: Kendall Flack, MD [place],  [place] [address]  HISTORICAL INFORMATION:   Selected notes from the MEDICAL RECORD NUMBER Referred by [name] for concern of sub-macular fluid / retinoschisis   CURRENT MEDICATIONS: No current outpatient medications on file. (Ophthalmic Drugs)   No current facility-administered medications for this visit. (Ophthalmic Drugs)   Current Outpatient Medications (Other)  Medication Sig   Cholecalciferol 1.25 MG (50000 UT) capsule cholecalciferol (vitamin D3) 1,250 mcg (50,000 unit) capsule  TAKE 1 CAPSULE BY MOUTH ONCE A WEEK   eplerenone (INSPRA) 50 MG tablet Take 1 tablet (50 mg total) by mouth daily.   FLUoxetine (PROZAC) 20 MG capsule Take 20 mg by mouth daily.   hydrochlorothiazide (HYDRODIURIL) 25 MG tablet TAKE 1 TABLET BY MOUTH ONCE DAILY IN THE MORNING FOR 90 DAYS   lisinopril (PRINIVIL,ZESTRIL) 20 MG tablet Take 1 tablet (20 mg total) by mouth daily.   terbinafine (LAMISIL) 250 MG tablet terbinafine HCl 250 mg tablet  TAKE 1 TABLET BY MOUTH ONCE DAILY FOR 90 DAYS   No current facility-administered medications for this visit. (Other)      REVIEW OF  SYSTEMS: ROS    Positive for: Eyes   Negative for: Constitutional, Gastrointestinal, Neurological, Skin, Genitourinary, Musculoskeletal, HENT, Endocrine, Cardiovascular, Respiratory, Psychiatric, Allergic/Imm, Heme/Lymph   Last edited by [name] on 08/13/2020  1:13 PM. (History)       ALLERGIES No Known Allergies  PAST MEDICAL HISTORY Past Medical History:  Diagnosis Date   Cataract    Mixed form OU   CIN I (cervical intraepithelial neoplasia I)    Depression    High risk HPV infection    Hypertension    Hypertensive retinopathy    OU   Missed ab AGUST 6 2014   Retina disorder 10/22/2019   pt states taking inspra for excess fluid in retina   Sciatic nerve injury    Past Surgical History:  Procedure Laterality Date   MYOMECTOMY  2008    FAMILY HISTORY Family History  Problem Relation Age of Onset   Hypertension Mother    Diabetes Father    Diabetes Maternal Grandmother    Hypertension Maternal Grandmother    Colon cancer Neg Hx    Colon polyps Neg Hx    Esophageal cancer Neg Hx    Rectal cancer Neg Hx    Stomach cancer Neg Hx     SOCIAL HISTORY Social History   Tobacco Use   Smoking status: Never Smoker   Smokeless tobacco: Never Used  Vaping Use   Vaping Use: Never used  Substance Use Topics   Alcohol use: No    Alcohol/week: 0.0 standard drinks   Drug use: No         OPHTHALMIC EXAM:  Base Eye Exam    Visual Acuity (Snellen - Linear)      Right Left   Dist cc 20/60 -2 20/20 -2   Dist ph cc NI    Correction: Glasses       Tonometry (Tonopen, 1:16 PM)      Right Left   Pressure 11 12       Pupils      Dark Light Shape React APD   Right 5 4 Round Brisk 0   Left 5 4 Round Brisk 0       Visual Fields      Left Right    Full Full       Extraocular Movement      Right Left    Full Full       Neuro/Psych    Oriented x3: Yes   Mood/Affect: Normal       Dilation    Both eyes: 1.0% Mydriacyl,  2.5% Phenylephrine @ 1:17 PM        Slit Lamp and Fundus Exam    Slit Lamp Exam      Right Left   Lids/Lashes Dermatochalasis - upper lid Dermatochalasis - upper lid   Conjunctiva/Sclera Injection, nasal pterygium 1+ Injection, nasal Pinguecula   Cornea Nasal ptergyium extending 2.83mm onto nasal cornea 1+Punctate epithelial erosions   Anterior Chamber Deep, clear, narrow temporal angle, No cell or flare deep, narrow temporal angle, 1+pigment   Iris Round and dilated Round and dilated   Lens 2+ Nuclear sclerosis, 2+ Cortical cataract 2+ Nuclear sclerosis, 2+ Cortical cataract   Vitreous Mild Vitreous syneresis, Posterior vitreous detachment, vitreous condensations Mild Vitreous syneresis       Fundus Exam      Right Left   Disc Pink and Sharp, +cupping Pink and Sharp, +cupping   C/D Ratio 0.65 0.6   Macula Flat, Blunted foveal reflex, Interval improvement in central SRF, but persistent SRF nasally, RPE mottling, clumping and atrophy, No heme  Flat, good foveal reflex, Epiretinal membrane, mild RPE mottling and clumping superiorly, No heme or edema   Vessels Vascular attenuation, mild Tortuousity Vascular attenuation, mild Tortuousity   Periphery Attached, bullous Retinoschisis cavity from 0700-0830 -- no associated RT/RD - stable from prior, shallow schisis cabity ST, mild peripheral cystoid degeneration Attached, shallow temporal retinoschisis cavity peripheral -- stable from prior        Refraction    Wearing Rx      Sphere Cylinder Axis   Right +1.25 +1.25 112   Left +2.25 +1.00 101          IMAGING AND PROCEDURES  Imaging and Procedures for @TODAY @  OCT, Retina - OU - Both Eyes       Right Eye Quality was good. Central Foveal Thickness: 263. Progression has improved. Findings include normal foveal contour, no IRF, outer retinal atrophy, vitreomacular adhesion  (Interval improvement in in central SRF, but persistent SRF nasally with ratty photoreceptors, IT schisis  caught on widefield).   Left Eye Quality was good. Central Foveal Thickness: 299. Progression has been stable. Findings include normal foveal contour, no IRF, no SRF, vitreomacular adhesion .   Notes *Images captured and stored on drive  Diagnosis / Impression:  OD: Interval improvement in central SRF, but persistent  SRF nasally with ratty photoreceptors, IT schisis caught on widefield OS: NFP, no IRF/SRF, VMA  Clinical management:  See below  Abbreviations: NFP - Normal foveal profile. CME - cystoid macular edema. PED - pigment epithelial detachment. IRF - intraretinal fluid. SRF - subretinal fluid. EZ - ellipsoid zone. ERM - epiretinal membrane. ORA - outer retinal atrophy. ORT - outer retinal tubulation. SRHM - subretinal hyper-reflective material        Intravitreal Injection, Pharmacologic Agent - OD - Right Eye       Time Out 08/13/2020. 1:37 PM. Confirmed correct patient, procedure, site, and patient consented.   Anesthesia Topical anesthesia was used. Anesthetic medications included Lidocaine 2%, Proparacaine 0.5%.   Procedure Preparation included 5% betadine to ocular surface, eyelid speculum. A supplied needle was used.   Injection:  1.25 mg Bevacizumab (AVASTIN) SOLN   NDC: 32671-245-80, Lot: [id number] , Expiration date: 08/19/2020   Route: Intravitreal, Site: Right Eye, Waste: 0 mL  Post-op Post injection exam found visual acuity of at least counting fingers. The patient tolerated the procedure well. There were no complications. The patient received written and verbal post procedure care education. Post injection medications were not given.                 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 Exudative age-related macular degeneration of right eye with active choroidal neovascularization (HCC)   H35.3211 Intravitreal Injection, Pharmacologic Agent - OD - Right Eye    Bevacizumab (AVASTIN) SOLN 1.25 mg  3. 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 Essential hypertension  I10   6. Hypertensive retinopathy of both eyes  H35.033   7. 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 CSCR OD -- likely chronic  - pt reports decreased vision OD since December 2019  - reports significant stressors in life  - denies steroid use  - FA (09.30.20) shows focal hyperfluorescent leakage points nasal macula OD -- expansile dot phenotype?  - started 50 mg po eplerenone since 09.30.20  -- pt self d/c in February, re-started in June 2021  - s/p IVA #1 (09.01.21), #2 (09.29.21)  - BCVA improved to 20/60 from 20/80 OD  - OCT shows Interval improvement in central SRF, but persistent SRF nasally with ratty photoreceptors  - differential includes atypical exudative ARMD  - recommend IVA OD #3 today, 11.03.2021  - RBA of procedure discussed, questions answered  - informed consent obtained and signed 9.1.21  - see procedure note  - recommend cont po eplerenone 50 mg daily  - f/u 4 weeks -- DFE/OCT, possible injection  4. Retinoschisis OU (OD > OS) -- stable  - bullous retinoschisis cavity IT quadrant OD-- 22.1.21 oclock  - shallow retinoschisis cavity IT quadrant OS  - no associated retinal holes/RT/RD on scleral depression  - discussed findings and prognosis  - recommend observation -- consider laser retinopexy if schisis progresses or threatens macula or develops RD  5,6. Hypertensive retinopathy OU  - discussed importance of tight BP control  - monitor  7. 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  - discussed diagnosis and progression  - not yet visually significant  -  monitor for now   Ophthalmic Meds Ordered this  visit:  Meds ordered this encounter  Medications   Bevacizumab (AVASTIN) SOLN 1.25 mg       Return in about 4 weeks (around 09/10/2020) for f/u CSR OD, DFE, OCT.  There are no Patient Instructions on file for this visit.   Explained the diagnoses, plan, and follow up with the patient and they expressed understanding.  Patient expressed understanding of the importance of proper follow up care.   This document serves as a record of services personally performed by Gardiner Sleeper, MD, PhD. It was created on their behalf by Roselee Nova, COMT. The creation of this record is the provider's dictation and/or activities during the visit.  Electronically signed by: Roselee Nova, COMT 08/13/20 2:31 PM   This document serves as a record of services personally performed by Gardiner Sleeper, MD, PhD. It was created on their behalf by San Jetty. Owens Shark, OA an ophthalmic technician. The creation of this record is the provider's dictation and/or activities during the visit.    Electronically signed by: San Jetty. Owens Shark, [place] 11.03.2021 2:31 PM  Gardiner Sleeper, M.D., Ph.D. Diseases & Surgery of the Retina and Vitreous [facility]  I have reviewed the above documentation for accuracy and completeness, and I agree with the above. Gardiner Sleeper, M.D., Ph.D. 08/13/20 2:31 PM  Abbreviations: M myopia (nearsighted); A astigmatism; H hyperopia (farsighted); P presbyopia; Mrx spectacle prescription;  CTL contact lenses; OD right eye; OS left eye; OU both eyes  XT exotropia; ET esotropia; PEK punctate epithelial keratitis; PEE punctate epithelial erosions; DES dry eye syndrome; MGD meibomian gland dysfunction; ATs artificial tears; PFAT's preservative free artificial tears; [place] nuclear sclerotic cataract; PSC posterior subcapsular cataract; ERM epi-retinal membrane; PVD posterior vitreous detachment; RD retinal detachment; DM diabetes mellitus; DR diabetic retinopathy; NPDR non-proliferative  diabetic retinopathy; PDR proliferative diabetic retinopathy; CSME clinically significant macular edema; DME diabetic macular edema; dbh dot blot hemorrhages; CWS cotton wool spot; POAG primary open angle glaucoma; C/D cup-to-disc ratio; HVF humphrey visual field; GVF goldmann visual field; OCT optical coherence tomography; IOP intraocular pressure; BRVO Branch retinal vein occlusion; CRVO central retinal vein occlusion; CRAO central retinal artery occlusion; BRAO branch retinal artery occlusion; RT retinal tear; SB scleral buckle; PPV pars plana vitrectomy; VH Vitreous hemorrhage; PRP panretinal laser photocoagulation; IVK intravitreal kenalog; VMT vitreomacular traction; MH Macular hole;  NVD neovascularization of the disc; NVE neovascularization elsewhere; AREDS age related eye disease study; ARMD age related macular degeneration; POAG primary open angle glaucoma; EBMD epithelial/anterior basement membrane dystrophy; ACIOL anterior chamber intraocular lens; IOL intraocular lens; PCIOL posterior chamber intraocular lens; Phaco/IOL phacoemulsification with intraocular lens placement; [place] photorefractive keratectomy; LASIK laser assisted in situ keratomileusis; HTN hypertension; DM diabetes mellitus; COPD chronic obstructive pulmonary disease

## 2020-08-13 ENCOUNTER — Encounter (INDEPENDENT_AMBULATORY_CARE_PROVIDER_SITE_OTHER): Payer: Self-pay | Admitting: Ophthalmology

## 2020-08-13 ENCOUNTER — Other Ambulatory Visit: Payer: Self-pay

## 2020-08-13 ENCOUNTER — Ambulatory Visit (INDEPENDENT_AMBULATORY_CARE_PROVIDER_SITE_OTHER): Payer: Commercial Managed Care - PPO | Admitting: Ophthalmology

## 2020-08-13 DIAGNOSIS — H35711 Central serous chorioretinopathy, right eye: Secondary | ICD-10-CM | POA: Diagnosis not present

## 2020-08-13 DIAGNOSIS — H35033 Hypertensive retinopathy, bilateral: Secondary | ICD-10-CM

## 2020-08-13 DIAGNOSIS — H25813 Combined forms of age-related cataract, bilateral: Secondary | ICD-10-CM

## 2020-08-13 DIAGNOSIS — H3581 Retinal edema: Secondary | ICD-10-CM | POA: Diagnosis not present

## 2020-08-13 DIAGNOSIS — I10 Essential (primary) hypertension: Secondary | ICD-10-CM

## 2020-08-13 DIAGNOSIS — H353211 Exudative age-related macular degeneration, right eye, with active choroidal neovascularization: Secondary | ICD-10-CM

## 2020-08-13 DIAGNOSIS — H33103 Unspecified retinoschisis, bilateral: Secondary | ICD-10-CM

## 2020-08-13 MED ORDER — BEVACIZUMAB CHEMO INJECTION 1.25MG/0.05ML SYRINGE FOR KALEIDOSCOPE
1.2500 mg | INTRAVITREAL | Status: AC | PRN
  Administered 2020-08-13: 1.25 mg via INTRAVITREAL

## 2020-09-11 NOTE — Progress Notes (Signed)
[facility] Note  09/12/2020     CHIEF COMPLAINT Patient presents for Retina Follow Up   HISTORY OF PRESENT ILLNESS: [name] is a 49 y.o. female who presents to the clinic today for:   HPI    Retina Follow Up    Patient presents with  Other.  In right eye.  This started 4 weeks ago.  I, the attending physician,  performed the HPI with the patient and updated documentation appropriately.          Comments    Patient here for 4 weeks for retina follow up for CSR OD. Patient states vision doing good. No eye pain.        Last edited by [name], MD on 09/12/2020  1:32 PM. (History)    Pt still taking eplerenone  Referring physician: Kendall Flack, MD [place],  [place] [id number]  HISTORICAL INFORMATION:   Selected notes from the MEDICAL RECORD NUMBER Referred by [name] for concern of sub-macular fluid / retinoschisis   CURRENT MEDICATIONS: No current outpatient medications on file. (Ophthalmic Drugs)   No current facility-administered medications for this visit. (Ophthalmic Drugs)   Current Outpatient Medications (Other)  Medication Sig  . Cholecalciferol 1.25 MG (50000 UT) capsule cholecalciferol (vitamin D3) 1,250 mcg (50,000 unit) capsule  TAKE 1 CAPSULE BY MOUTH ONCE A WEEK  . eplerenone (INSPRA) 50 MG tablet Take 1 tablet (50 mg total) by mouth daily.  Marland Kitchen FLUoxetine (PROZAC) 20 MG capsule Take 20 mg by mouth daily.  . hydrochlorothiazide (HYDRODIURIL) 25 MG tablet TAKE 1 TABLET BY MOUTH ONCE DAILY IN THE MORNING FOR 90 DAYS  . lisinopril (PRINIVIL,ZESTRIL) 20 MG tablet Take 1 tablet (20 mg total) by mouth daily.  Marland Kitchen terbinafine (LAMISIL) 250 MG tablet terbinafine HCl 250 mg tablet  TAKE 1 TABLET BY MOUTH ONCE DAILY FOR 90 DAYS   No current facility-administered medications for this visit. (Other)      REVIEW OF SYSTEMS: ROS    Positive for: Eyes   Negative for: Constitutional,  Gastrointestinal, Neurological, Skin, Genitourinary, Musculoskeletal, HENT, Endocrine, Cardiovascular, Respiratory, Psychiatric, Allergic/Imm, Heme/Lymph   Last edited by [name], COA on 09/12/2020  1:18 PM. (History)       ALLERGIES No Known Allergies  PAST MEDICAL HISTORY Past Medical History:  Diagnosis Date  . Cataract    Mixed form OU  . CIN I (cervical intraepithelial neoplasia I)   . Depression   . High risk HPV infection   . Hypertension   . Hypertensive retinopathy    OU  . Missed ab AGUST 6 2014  . Retina disorder 10/22/2019   pt states taking inspra for excess fluid in retina  . Sciatic nerve injury    Past Surgical History:  Procedure Laterality Date  . MYOMECTOMY  2008    FAMILY HISTORY Family History  Problem Relation Age of Onset  . Hypertension Mother   . Diabetes Father   . Diabetes Maternal Grandmother   . Hypertension Maternal Grandmother   . Colon cancer Neg Hx   . Colon polyps Neg Hx   . Esophageal cancer Neg Hx   . Rectal cancer Neg Hx   . Stomach cancer Neg Hx     SOCIAL HISTORY Social History   Tobacco Use  . Smoking status: Never Smoker  . Smokeless tobacco: Never Used  Vaping Use  . Vaping Use: Never used  Substance Use Topics  . Alcohol  use: No    Alcohol/week: 0.0 standard drinks  . Drug use: No         OPHTHALMIC EXAM:  Base Eye Exam    Visual Acuity (Snellen - Linear)      Right Left   Dist cc 20/70 -2 20/25 -2   Dist ph cc 20/70 +2 20/20 -2   Correction: Glasses       Tonometry (Tonopen, 1:15 PM)      Right Left   Pressure 19 17       Pupils      Dark Light Shape React APD   Right 4 3 Round Brisk None   Left 4 3 Round Brisk None       Visual Fields (Counting fingers)      Left Right    Full Full       Extraocular Movement      Right Left    Full Full       Neuro/Psych    Oriented x3: Yes   Mood/Affect: Normal       Dilation    Both eyes: 1.0% Mydriacyl, 2.5% Phenylephrine @ 1:15  PM        Slit Lamp and Fundus Exam    Slit Lamp Exam      Right Left   Lids/Lashes Dermatochalasis - upper lid Dermatochalasis - upper lid   Conjunctiva/Sclera Injection, nasal pterygium 1+ Injection, nasal Pinguecula   Cornea Nasal ptergyium extending 2.33mm onto nasal cornea 1+Punctate epithelial erosions   Anterior Chamber Deep, clear, narrow temporal angle, No cell or flare deep, narrow temporal angle, 1+pigment   Iris Round and dilated Round and dilated   Lens 2+ Nuclear sclerosis, 2+ Cortical cataract 2+ Nuclear sclerosis, 2+ Cortical cataract   Vitreous Mild Vitreous syneresis, Posterior vitreous detachment, vitreous condensations Mild Vitreous syneresis       Fundus Exam      Right Left   Disc Pink and Sharp, +cupping Pink and Sharp, +cupping   C/D Ratio 0.65 0.6   Macula Flat, Blunted foveal reflex, Interval increase in nasal SRF, RPE mottling, clumping and atrophy, No heme  Flat, good foveal reflex, Epiretinal membrane, mild RPE mottling and clumping superiorly, No heme or edema   Vessels Vascular attenuation, mild Tortuousity Vascular attenuation, mild Tortuousity   Periphery Attached, bullous Retinoschisis cavity from 0700-0830 -- no associated RT/RD - stable from prior, shallow schisis cabity ST, mild peripheral cystoid degeneration Attached, shallow temporal retinoschisis cavity peripheral -- stable from prior        Refraction    Wearing Rx      Sphere Cylinder Axis   Right +1.25 +1.25 112   Left +2.25 +1.00 101          IMAGING AND PROCEDURES  Imaging and Procedures for @TODAY @  OCT, Retina - OU - Both Eyes       Right Eye Quality was good. Central Foveal Thickness: 274. Progression has worsened. Findings include normal foveal contour, no IRF, outer retinal atrophy, vitreomacular adhesion  (Mild Interval increase in IRF nasal macula, IT schisis caught on widefield).   Left Eye Quality was good. Central Foveal Thickness: 299. Progression has been stable.  Findings include normal foveal contour, no IRF, no SRF, vitreomacular adhesion .   Notes *Images captured and stored on drive  Diagnosis / Impression:  OD: Mild Interval increase in IRF nasal macula, IT schisis caught on widefield OS: NFP, no IRF/SRF, VMA  Clinical management:  See below  Abbreviations: NFP - Normal  foveal profile. CME - cystoid macular edema. PED - pigment epithelial detachment. IRF - intraretinal fluid. SRF - subretinal fluid. EZ - ellipsoid zone. ERM - epiretinal membrane. ORA - outer retinal atrophy. ORT - outer retinal tubulation. SRHM - subretinal hyper-reflective material        Intravitreal Injection, Pharmacologic Agent - OD - Right Eye       Time Out 09/12/2020. 1:55 PM. Confirmed correct patient, procedure, site, and patient consented.   Anesthesia Topical anesthesia was used. Anesthetic medications included Lidocaine 2%, Proparacaine 0.5%.   Procedure Preparation included 5% betadine to ocular surface, eyelid speculum. A supplied needle was used.   Injection:  1.25 mg Bevacizumab (AVASTIN) 1.25mg /0.73mL SOLN   NDC: 59935-701-77, Lot: [id number] , Expiration date: 10/08/2020   Route: Intravitreal, Site: Right Eye, Waste: 0 mL  Post-op Post injection exam found visual acuity of at least counting fingers. The patient tolerated the procedure well. There were no complications. The patient received written and verbal post procedure care education. Post injection medications were not given.                 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 Exudative age-related macular degeneration of right eye with active choroidal neovascularization (HCC)  H35.3211 Intravitreal Injection, Pharmacologic Agent - OD - Right Eye    Bevacizumab (AVASTIN) SOLN 1.25 mg  3. Retinal edema  H35.81 OCT, Retina -  OU - Both Eyes  4. 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 Essential hypertension  I10   6. Hypertensive retinopathy of both eyes  H35.033   7. 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 CSCR OD -- likely chronic  - pt reports decreased vision OD since December 2019  - reports significant stressors in life  - denies steroid use  - FA (09.30.20) shows focal hyperfluorescent leakage points nasal macula OD -- expansile dot phenotype?  - started 50 mg po eplerenone since 09.30.20  -- pt self d/c in February, re-started in June 2021  - s/p IVA #1 (09.01.21), #2 (09.29.21), #3 (11.03.21)  - BCVA 20/70 from 20/60 OD  - OCT shows mild Interval increase in nasal SRF -- discussed possible IVA resistance   - differential includes atypical exudative ARMD  - recommend IVA OD #4 today, 12.03.2021  - RBA of procedure discussed, questions answered  - informed consent obtained and signed 09.01.21  - see procedure note  - recommend cont po eplerenone 50 mg daily  - Eylea4U benefits investigation initiated 12.3.21  - f/u 4 weeks -- DFE/OCT, possible injection  4. Retinoschisis OU (OD > OS) -- stable  - bullous retinoschisis cavity IT quadrant OD-- 13 oclock  - shallow retinoschisis cavity IT quadrant OS  - no associated retinal holes/RT/RD on scleral depression  - discussed findings and prognosis  - recommend observation -- consider laser retinopexy if schisis progresses or threatens macula or develops RD  5,6. Hypertensive retinopathy OU  - discussed importance of tight BP control  - monitor  7. 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  - discussed diagnosis and progression  - not yet visually significant  - monitor for now   Ophthalmic Meds Ordered this visit:  Meds ordered this encounter  Medications  . Bevacizumab (AVASTIN) SOLN 1.25 mg       Return in  about 4 weeks (around 10/10/2020) for f/u CSR OD, DFE, OCT.  There are no Patient Instructions on file for this visit.   Explained the diagnoses, plan, and follow up with the patient and they expressed understanding.  Patient expressed understanding of the importance of proper follow up care.   This document serves as a record of services personally performed by Gardiner Sleeper, MD, PhD. It was created on their behalf by San Jetty. Owens Shark, OA an ophthalmic technician. The creation of this record is the provider's dictation and/or activities during the visit.    Electronically signed by: San Jetty. Owens Shark, [place] 12.02.2021 3:20 PM  Gardiner Sleeper, M.D., Ph.D. Diseases & Surgery of the Retina and Vitreous [facility]  I have reviewed the above documentation for accuracy and completeness, and I agree with the above. Gardiner Sleeper, M.D., Ph.D. 09/12/20 3:20 PM   Abbreviations: M myopia (nearsighted); A astigmatism; H hyperopia (farsighted); P presbyopia; Mrx spectacle prescription;  CTL contact lenses; OD right eye; OS left eye; OU both eyes  XT exotropia; ET esotropia; PEK punctate epithelial keratitis; PEE punctate epithelial erosions; DES dry eye syndrome; MGD meibomian gland dysfunction; ATs artificial tears; PFAT's preservative free artificial tears; [place] nuclear sclerotic cataract; PSC posterior subcapsular cataract; ERM epi-retinal membrane; PVD posterior vitreous detachment; RD retinal detachment; DM diabetes mellitus; DR diabetic retinopathy; NPDR non-proliferative diabetic retinopathy; PDR proliferative diabetic retinopathy; CSME clinically significant macular edema; DME diabetic macular edema; dbh dot blot hemorrhages; CWS cotton wool spot; POAG primary open angle glaucoma; C/D cup-to-disc ratio; HVF humphrey visual field; GVF goldmann visual field; OCT optical coherence tomography; IOP intraocular pressure; BRVO Branch retinal vein occlusion; CRVO central retinal vein  occlusion; CRAO central retinal artery occlusion; BRAO branch retinal artery occlusion; RT retinal tear; SB scleral buckle; PPV pars plana vitrectomy; VH Vitreous hemorrhage; PRP panretinal laser photocoagulation; IVK intravitreal kenalog; VMT vitreomacular traction; MH Macular hole;  NVD neovascularization of the disc; NVE neovascularization elsewhere; AREDS age related eye disease study; ARMD age related macular degeneration; POAG primary open angle glaucoma; EBMD epithelial/anterior basement membrane dystrophy; ACIOL anterior chamber intraocular lens; IOL intraocular lens; PCIOL posterior chamber intraocular lens; Phaco/IOL phacoemulsification with intraocular lens placement; [place] photorefractive keratectomy; LASIK laser assisted in situ keratomileusis; HTN hypertension; DM diabetes mellitus; COPD chronic obstructive pulmonary disease

## 2020-09-12 ENCOUNTER — Other Ambulatory Visit: Payer: Self-pay

## 2020-09-12 ENCOUNTER — Ambulatory Visit (INDEPENDENT_AMBULATORY_CARE_PROVIDER_SITE_OTHER): Payer: Commercial Managed Care - PPO | Admitting: Ophthalmology

## 2020-09-12 ENCOUNTER — Encounter (INDEPENDENT_AMBULATORY_CARE_PROVIDER_SITE_OTHER): Payer: Self-pay | Admitting: Ophthalmology

## 2020-09-12 DIAGNOSIS — I10 Essential (primary) hypertension: Secondary | ICD-10-CM

## 2020-09-12 DIAGNOSIS — H35711 Central serous chorioretinopathy, right eye: Secondary | ICD-10-CM

## 2020-09-12 DIAGNOSIS — H353211 Exudative age-related macular degeneration, right eye, with active choroidal neovascularization: Secondary | ICD-10-CM | POA: Diagnosis not present

## 2020-09-12 DIAGNOSIS — H35033 Hypertensive retinopathy, bilateral: Secondary | ICD-10-CM

## 2020-09-12 DIAGNOSIS — H3581 Retinal edema: Secondary | ICD-10-CM | POA: Diagnosis not present

## 2020-09-12 DIAGNOSIS — H33103 Unspecified retinoschisis, bilateral: Secondary | ICD-10-CM

## 2020-09-12 DIAGNOSIS — H25813 Combined forms of age-related cataract, bilateral: Secondary | ICD-10-CM

## 2020-09-12 MED ORDER — BEVACIZUMAB CHEMO INJECTION 1.25MG/0.05ML SYRINGE FOR KALEIDOSCOPE
1.2500 mg | INTRAVITREAL | Status: AC | PRN
  Administered 2020-09-12: 1.25 mg via INTRAVITREAL

## 2020-10-13 ENCOUNTER — Encounter (INDEPENDENT_AMBULATORY_CARE_PROVIDER_SITE_OTHER): Payer: Commercial Managed Care - PPO | Admitting: Ophthalmology

## 2020-10-27 ENCOUNTER — Encounter (INDEPENDENT_AMBULATORY_CARE_PROVIDER_SITE_OTHER): Payer: Commercial Managed Care - PPO | Admitting: Ophthalmology

## 2020-10-29 NOTE — Progress Notes (Signed)
[facility] Note  10/30/2020     CHIEF COMPLAINT Patient presents for Retina Follow Up   HISTORY OF PRESENT ILLNESS: [name] is a 50 y.o. female who presents to the clinic today for:   HPI    Retina Follow Up    Patient presents with  Other.  In right eye.  Duration of 7 weeks.  Since onset it is stable.  I, the attending physician,  performed the HPI with the patient and updated documentation appropriately.          Comments    7 week follow up CSCR OD- Doing well, vision stable OU.        Last edited by [name], MD on 10/30/2020  4:45 PM. (History)    Pt states her vision seems stable, she states the last injection was painful and her eye has been hurting when she squints since then, pt started seeing a new "dot" in her vision about 2 weeks ago, she is still taking eplerenone  Referring physician: Kendall Flack, MD [place],  [place] [id number]  HISTORICAL INFORMATION:   Selected notes from the MEDICAL RECORD NUMBER Referred by [name] for concern of sub-macular fluid / retinoschisis   CURRENT MEDICATIONS: No current outpatient medications on file. (Ophthalmic Drugs)   No current facility-administered medications for this visit. (Ophthalmic Drugs)   Current Outpatient Medications (Other)  Medication Sig  . Cholecalciferol 1.25 MG (50000 UT) capsule cholecalciferol (vitamin D3) 1,250 mcg (50,000 unit) capsule  TAKE 1 CAPSULE BY MOUTH ONCE A WEEK  . eplerenone (INSPRA) 50 MG tablet Take 1 tablet (50 mg total) by mouth daily.  Marland Kitchen FLUoxetine (PROZAC) 20 MG capsule Take 20 mg by mouth daily.  . hydrochlorothiazide (HYDRODIURIL) 25 MG tablet TAKE 1 TABLET BY MOUTH ONCE DAILY IN THE MORNING FOR 90 DAYS  . lisinopril (PRINIVIL,ZESTRIL) 20 MG tablet Take 1 tablet (20 mg total) by mouth daily.  Marland Kitchen terbinafine (LAMISIL) 250 MG tablet terbinafine HCl 250 mg tablet  TAKE 1 TABLET BY MOUTH ONCE DAILY FOR 90  DAYS   No current facility-administered medications for this visit. (Other)      REVIEW OF SYSTEMS: ROS    Positive for: Eyes, Psychiatric   Negative for: Constitutional, Gastrointestinal, Neurological, Skin, Genitourinary, Musculoskeletal, HENT, Endocrine, Cardiovascular, Respiratory, Allergic/Imm, Heme/Lymph   Last edited by Leonie Douglas, COA on 10/30/2020  2:55 PM. (History)       ALLERGIES No Known Allergies  PAST MEDICAL HISTORY Past Medical History:  Diagnosis Date  . Cataract    Mixed form OU  . CIN I (cervical intraepithelial neoplasia I)   . Depression   . High risk HPV infection   . Hypertension   . Hypertensive retinopathy    OU  . Missed ab AGUST 6 2014  . Retina disorder 10/22/2019   pt states taking inspra for excess fluid in retina  . Sciatic nerve injury    Past Surgical History:  Procedure Laterality Date  . MYOMECTOMY  2008    FAMILY HISTORY Family History  Problem Relation Age of Onset  . Hypertension Mother   . Diabetes Father   . Diabetes Maternal Grandmother   . Hypertension Maternal Grandmother   . Colon cancer Neg Hx   . Colon polyps Neg Hx   . Esophageal cancer Neg Hx   . Rectal cancer Neg Hx   . Stomach cancer Neg Hx     SOCIAL HISTORY Social  History   Tobacco Use  . Smoking status: Never Smoker  . Smokeless tobacco: Never Used  Vaping Use  . Vaping Use: Never used  Substance Use Topics  . Alcohol use: No    Alcohol/week: 0.0 standard drinks  . Drug use: No         OPHTHALMIC EXAM:  Base Eye Exam    Visual Acuity (Snellen - Linear)      Right Left   Dist cc 20/70 -2 20/20 -2   Dist ph cc NI    Correction: Glasses       Tonometry (Tonopen, 3:01 PM)      Right Left   Pressure 18 16       Pupils      Dark Light Shape React APD   Right 4 3 Round Brisk None   Left 4 3 Round Brisk None       Visual Fields (Counting fingers)      Left Right    Full Full       Extraocular Movement      Right Left     Full Full       Neuro/Psych    Oriented x3: Yes   Mood/Affect: Normal       Dilation    Both eyes: 1.0% Mydriacyl, 2.5% Phenylephrine @ 3:01 PM        Slit Lamp and Fundus Exam    Slit Lamp Exam      Right Left   Lids/Lashes Dermatochalasis - upper lid Dermatochalasis - upper lid   Conjunctiva/Sclera Injection, nasal pterygium 1+ Injection, nasal Pinguecula   Cornea Nasal ptergyium extending 2.67mm onto nasal cornea 1+Punctate epithelial erosions   Anterior Chamber Deep, clear, narrow temporal angle, No cell or flare deep, narrow temporal angle, 1+pigment   Iris Round and dilated Round and dilated   Lens 2+ Nuclear sclerosis, 2+ Cortical cataract 2+ Nuclear sclerosis, 2+ Cortical cataract   Vitreous Mild Vitreous syneresis, Posterior vitreous detachment, vitreous condensations Mild Vitreous syneresis       Fundus Exam      Right Left   Disc Pink and Sharp, +cupping Pink and Sharp, +cupping   C/D Ratio 0.65 0.6   Macula Flat, Blunted foveal reflex, Interval increase in nasal SRF, RPE mottling, clumping and atrophy, No heme  Flat, good foveal reflex, Epiretinal membrane, mild RPE mottling and clumping superiorly, No heme or edema   Vessels Vascular attenuation, mild Tortuousity Vascular attenuation, mild Tortuousity   Periphery Attached, bullous Retinoschisis cavity from 0700-0830 -- no associated RT/RD - stable from prior, shallow schisis cavity ST, mild peripheral cystoid degeneration Attached, shallow temporal retinoschisis cavity peripheral -- stable from prior        Refraction    Wearing Rx      Sphere Cylinder Axis   Right +1.25 +1.25 112   Left +2.25 +1.00 101          IMAGING AND PROCEDURES  Imaging and Procedures for @TODAY @  OCT, Retina - OU - Both Eyes       Right Eye Quality was good. Central Foveal Thickness: 266. Progression has been stable. Findings include normal foveal contour, no IRF, outer retinal atrophy, vitreomacular adhesion  (Persistent SRF  nasal macula, IT schisis caught on widefield).   Left Eye Quality was good. Central Foveal Thickness: 297. Progression has been stable. Findings include normal foveal contour, no IRF, no SRF, vitreomacular adhesion .   Notes *Images captured and stored on drive  Diagnosis / Impression:  OD: Persistent SRF nasal macula, IT schisis caught on widefield OS: NFP, no IRF/SRF, VMA  Clinical management:  See below  Abbreviations: NFP - Normal foveal profile. CME - cystoid macular edema. PED - pigment epithelial detachment. IRF - intraretinal fluid. SRF - subretinal fluid. EZ - ellipsoid zone. ERM - epiretinal membrane. ORA - outer retinal atrophy. ORT - outer retinal tubulation. SRHM - subretinal hyper-reflective material        Intravitreal Injection, Pharmacologic Agent - OD - Right Eye       Time Out 10/30/2020. 3:26 PM. Confirmed correct patient, procedure, site, and patient consented.   Anesthesia Topical anesthesia was used. Anesthetic medications included Lidocaine 2%, Proparacaine 0.5%.   Procedure Preparation included 5% betadine to ocular surface, eyelid speculum. A supplied needle was used.   Injection:  2 mg aflibercept Alfonse Flavors) SOLN   NDC: A3590391, Lot: [id number], Expiration date: 01/09/2021   Route: Intravitreal, Site: Right Eye, Waste: 0.05 mL  Post-op Post injection exam found visual acuity of at least counting fingers. The patient tolerated the procedure well. There were no complications. The patient received written and verbal post procedure care education. Post injection medications were not given.                 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 Exudative age-related macular degeneration of right eye with active choroidal neovascularization (HCC)  H35.3211 Intravitreal Injection, Pharmacologic  Agent - OD - Right Eye    aflibercept (EYLEA) SOLN 2 mg  3. 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 Essential hypertension  I10   6. Hypertensive retinopathy of both eyes  H35.033   7. 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 CSCR OD -- likely chronic  - pt reports decreased vision OD since December 2019  - reports significant stressors in life  - denies steroid use  - FA (09.30.20) shows focal hyperfluorescent leakage points nasal macula OD -- expansile dot phenotype?  - started 50 mg po eplerenone since 09.30.20  -- pt self d/c in February, re-started in June 2021  - s/p IVA #1 (09.01.21), #2 (09.29.21), #3 (11.03.21), #4 (12.03.21)  - BCVA 20/70 OD (stable)  - OCT shows persistent nasal SRF -- discussed possible IVA resistance   - differential includes atypical exudative ARMD  - recommend IVE OD #1 today, 01.20.22  - RBA of procedure discussed, questions answered  - IVA informed consent obtained and signed 09.01.21  - IVE informed consent obtained and signed, 01.20.22  - see procedure note  - recommend cont po eplerenone 50 mg daily  - Eylea4U benefits investigation initiated 12.3.21 -- approved for 2022  - f/u 4 weeks -- DFE/OCT, possible injection  4. Retinoschisis OU (OD > OS) -- stable  - bullous retinoschisis cavity IT quadrant OD-- 6301-6010 oclock  - shallow retinoschisis cavity IT quadrant OS  - no associated retinal holes/RT/RD on scleral depression  - discussed findings and prognosis   - recommend observation -- consider laser retinopexy if schisis progresses or threatens macula or develops RD  5,6. Hypertensive retinopathy OU  - discussed importance of tight BP control  - monitor  7. Mixed form age related cataracts OU  - The symptoms of cataract, surgical options,  and treatments and risks were discussed with patient.  - discussed diagnosis and progression  - not yet visually  significant  - monitor for now   Ophthalmic Meds Ordered this visit:  Meds ordered this encounter  Medications  . aflibercept (EYLEA) SOLN 2 mg       Return in about 4 weeks (around 11/27/2020) for CSCR / ex ARMD OD - Dilated Exam, OCT, Possible Injxn.  There are no Patient Instructions on file for this visit.   Explained the diagnoses, plan, and follow up with the patient and they expressed understanding.  Patient expressed understanding of the importance of proper follow up care.   This document serves as a record of services personally performed by Gardiner Sleeper, MD, PhD. It was created on their behalf by Leonie Douglas, an ophthalmic technician. The creation of this record is the provider's dictation and/or activities during the visit.    Electronically signed by: Leonie Douglas COA, 10/30/20  4:47 PM   This document serves as a record of services personally performed by Gardiner Sleeper, MD, PhD. It was created on their behalf by San Jetty. Owens Shark, OA an ophthalmic technician. The creation of this record is the provider's dictation and/or activities during the visit.    Electronically signed by: San Jetty. Owens Shark, [place] 01.20.2022 4:47 PM   Gardiner Sleeper, M.D., Ph.D. Diseases & Surgery of the Retina and Vitreous [facility]  I have reviewed the above documentation for accuracy and completeness, and I agree with the above. Gardiner Sleeper, M.D., Ph.D. 10/30/20 4:47 PM   Abbreviations: M myopia (nearsighted); A astigmatism; H hyperopia (farsighted); P presbyopia; Mrx spectacle prescription;  CTL contact lenses; OD right eye; OS left eye; OU both eyes  XT exotropia; ET esotropia; PEK punctate epithelial keratitis; PEE punctate epithelial erosions; DES dry eye syndrome; MGD meibomian gland dysfunction; ATs artificial tears; PFAT's preservative free artificial tears; [place] nuclear sclerotic cataract; PSC posterior subcapsular cataract; ERM epi-retinal membrane; PVD  posterior vitreous detachment; RD retinal detachment; DM diabetes mellitus; DR diabetic retinopathy; NPDR non-proliferative diabetic retinopathy; PDR proliferative diabetic retinopathy; CSME clinically significant macular edema; DME diabetic macular edema; dbh dot blot hemorrhages; CWS cotton wool spot; POAG primary open angle glaucoma; C/D cup-to-disc ratio; HVF humphrey visual field; GVF goldmann visual field; OCT optical coherence tomography; IOP intraocular pressure; BRVO Branch retinal vein occlusion; CRVO central retinal vein occlusion; CRAO central retinal artery occlusion; BRAO branch retinal artery occlusion; RT retinal tear; SB scleral buckle; PPV pars plana vitrectomy; VH Vitreous hemorrhage; PRP panretinal laser photocoagulation; IVK intravitreal kenalog; VMT vitreomacular traction; MH Macular hole;  NVD neovascularization of the disc; NVE neovascularization elsewhere; AREDS age related eye disease study; ARMD age related macular degeneration; POAG primary open angle glaucoma; EBMD epithelial/anterior basement membrane dystrophy; ACIOL anterior chamber intraocular lens; IOL intraocular lens; PCIOL posterior chamber intraocular lens; Phaco/IOL phacoemulsification with intraocular lens placement; [place] photorefractive keratectomy; LASIK laser assisted in situ keratomileusis; HTN hypertension; DM diabetes mellitus; COPD chronic obstructive pulmonary disease

## 2020-10-30 ENCOUNTER — Other Ambulatory Visit: Payer: Self-pay

## 2020-10-30 ENCOUNTER — Ambulatory Visit (INDEPENDENT_AMBULATORY_CARE_PROVIDER_SITE_OTHER): Payer: Commercial Managed Care - PPO | Admitting: Ophthalmology

## 2020-10-30 ENCOUNTER — Encounter (INDEPENDENT_AMBULATORY_CARE_PROVIDER_SITE_OTHER): Payer: Self-pay | Admitting: Ophthalmology

## 2020-10-30 DIAGNOSIS — H35033 Hypertensive retinopathy, bilateral: Secondary | ICD-10-CM

## 2020-10-30 DIAGNOSIS — H35711 Central serous chorioretinopathy, right eye: Secondary | ICD-10-CM | POA: Diagnosis not present

## 2020-10-30 DIAGNOSIS — H353211 Exudative age-related macular degeneration, right eye, with active choroidal neovascularization: Secondary | ICD-10-CM

## 2020-10-30 DIAGNOSIS — H33103 Unspecified retinoschisis, bilateral: Secondary | ICD-10-CM

## 2020-10-30 DIAGNOSIS — H3581 Retinal edema: Secondary | ICD-10-CM

## 2020-10-30 DIAGNOSIS — H25813 Combined forms of age-related cataract, bilateral: Secondary | ICD-10-CM

## 2020-10-30 DIAGNOSIS — I10 Essential (primary) hypertension: Secondary | ICD-10-CM

## 2020-10-30 MED ORDER — AFLIBERCEPT 2MG/0.05ML IZ SOLN FOR KALEIDOSCOPE
2.0000 mg | INTRAVITREAL | Status: AC | PRN
Start: 2020-10-30 — End: 2020-10-30
  Administered 2020-10-30: 2 mg via INTRAVITREAL

## 2020-11-10 ENCOUNTER — Encounter: Payer: Commercial Managed Care - PPO | Admitting: Obstetrics & Gynecology

## 2020-11-20 ENCOUNTER — Encounter: Payer: Self-pay | Admitting: Obstetrics & Gynecology

## 2020-11-20 ENCOUNTER — Other Ambulatory Visit: Payer: Self-pay

## 2020-11-20 ENCOUNTER — Ambulatory Visit: Payer: Commercial Managed Care - PPO | Admitting: Obstetrics & Gynecology

## 2020-11-20 VITALS — BP 126/80 | Ht 65.0 in | Wt 166.0 lb

## 2020-11-20 DIAGNOSIS — Z789 Other specified health status: Secondary | ICD-10-CM

## 2020-11-20 DIAGNOSIS — Z01419 Encounter for gynecological examination (general) (routine) without abnormal findings: Secondary | ICD-10-CM | POA: Diagnosis not present

## 2020-11-20 DIAGNOSIS — N870 Mild cervical dysplasia: Secondary | ICD-10-CM | POA: Diagnosis not present

## 2020-11-20 NOTE — Progress Notes (Signed)
[name] Jul 26, 1971 [id number]   History:    50 y.o. G1P0A1 Boyfriend x 5 yrs  YD:[id number] presenting for annual gyn exam with repeat Pap test  GGE:ZMOQH 03/2018 CIN 1.  Last Pap 06/2020 LGSIL/HPV HR pos. Menses were regular normal every month until the LMP 09/2020.  Started having hot flushes. No pelvic pain. Using condoms. No pain with IC except feels a little dry. Breasts normal.  BMI 27.62.  Colono 10/2019 2 Polyps removed, repeat Colono at 3 yrs. Health labs with Fam MD.    Past medical history,surgical history, family history and social history were all reviewed and documented in the EPIC chart.  Gynecologic History Patient's last menstrual period was 09/26/2020.  Obstetric History OB History  Gravida Para Term Preterm AB Living  1 0     1 0  SAB IAB Ectopic Multiple Live Births  1            # Outcome Date GA Lbr Len/2nd Weight Sex Delivery Anes PTL Lv  1 SAB              ROS: A ROS was performed and pertinent positives and negatives are included in the history.  GENERAL: No fevers or chills. HEENT: No change in vision, no earache, sore throat or sinus congestion. NECK: No pain or stiffness. CARDIOVASCULAR: No chest pain or pressure. No palpitations. PULMONARY: No shortness of breath, cough or wheeze. GASTROINTESTINAL: No abdominal pain, nausea, vomiting or diarrhea, melena or bright red blood per rectum. GENITOURINARY: No urinary frequency, urgency, hesitancy or dysuria. MUSCULOSKELETAL: No joint or muscle pain, no back pain, no recent trauma. DERMATOLOGIC: No rash, no itching, no lesions. ENDOCRINE: No polyuria, polydipsia, no heat or cold intolerance. No recent change in weight. HEMATOLOGICAL: No anemia or easy bruising or bleeding. NEUROLOGIC: No headache, seizures, numbness, tingling or weakness. PSYCHIATRIC: No depression, no loss of interest in normal activity or change in sleep pattern.     Exam:   BP 126/80 (BP Location: Right Arm,  Patient Position: Sitting, Cuff Size: Normal)   Ht 5\' 5"  (1.651 m)   Wt 166 lb (75.3 kg)   LMP 09/26/2020   BMI 27.62 kg/m   Body mass index is 27.62 kg/m.  General appearance : Well developed well nourished female. No acute distress HEENT: Eyes: no retinal hemorrhage or exudates,  Neck supple, trachea midline, no carotid bruits, no thyroidmegaly Lungs: Clear to auscultation, no rhonchi or wheezes, or rib retractions  Heart: Regular rate and rhythm, no murmurs or gallops Breast:Examined in sitting and supine position were symmetrical in appearance, no palpable masses or tenderness,  no skin retraction, no nipple inversion, no nipple discharge, no skin discoloration, no axillary or supraclavicular lymphadenopathy Abdomen: no palpable masses or tenderness, no rebound or guarding Extremities: no edema or skin discoloration or tenderness  Pelvic: Vulva: Normal             Vagina: No gross lesions or discharge  Cervix: No gross lesions or discharge.  Pap reflex done.  Uterus  AV, normal size, shape and consistency, non-tender and mobile  Adnexa  Without masses or tenderness  Anus: Normal   Assessment/Plan:  50 y.o. female for annual exam   1. Encounter for gynecological examination with Papanicolaou smear of cervix Normal gynecologic exam.  Pap reflex done.  Breasts normal.  Screening mammo 12/2019 Neg.  Colono 10/2019, q3 yrs.  Health labs with Fam MD.  BMI 27.62.  Active physically at work.  Healthy nutrition.  2.  Dysplasia of cervix, low grade (CIN 1) Pap reflex done today.  Management per results.  3. Use of condoms for contraception   Princess Bruins MD, 3:24 PM 11/20/2020

## 2020-11-20 NOTE — Addendum Note (Signed)
Addended by: Lorine Bears on: 11/20/2020 03:43 PM   Modules accepted: Orders

## 2020-11-21 ENCOUNTER — Other Ambulatory Visit: Payer: Self-pay | Admitting: Obstetrics & Gynecology

## 2020-11-21 DIAGNOSIS — Z1231 Encounter for screening mammogram for malignant neoplasm of breast: Secondary | ICD-10-CM

## 2020-11-25 LAB — PAP IG W/ RFLX HPV ASCU

## 2020-11-26 NOTE — Progress Notes (Signed)
[facility] Note  11/28/2020     CHIEF COMPLAINT Patient presents for Retina Follow Up   HISTORY OF PRESENT ILLNESS: [name] is a 50 y.o. female who presents to the clinic today for:   HPI    Retina Follow Up    Patient presents with  Other.  In right eye.  This started 4 weeks ago.  I, the attending physician,  performed the HPI with the patient and updated documentation appropriately.          Comments    Patient here for 4 weeks retina follow up for CSR OD. Patient states vision doing good. No eye pain.        Last edited by [name], MD on 11/28/2020  1:13 PM. (History)    Pt states no change in vision  Referring physician: Kendall Flack, MD No address on file  HISTORICAL INFORMATION:   Selected notes from the MEDICAL RECORD NUMBER Referred by [name] for concern of sub-macular fluid / retinoschisis   CURRENT MEDICATIONS: No current outpatient medications on file. (Ophthalmic Drugs)   No current facility-administered medications for this visit. (Ophthalmic Drugs)   Current Outpatient Medications (Other)  Medication Sig  . eplerenone (INSPRA) 50 MG tablet Take 1 tablet (50 mg total) by mouth daily.  . hydrochlorothiazide (HYDRODIURIL) 25 MG tablet TAKE 1 TABLET BY MOUTH ONCE DAILY IN THE MORNING FOR 90 DAYS  . lisinopril (PRINIVIL,ZESTRIL) 20 MG tablet Take 1 tablet (20 mg total) by mouth daily.   No current facility-administered medications for this visit. (Other)      REVIEW OF SYSTEMS: ROS    Positive for: Eyes, Psychiatric   Negative for: Constitutional, Gastrointestinal, Neurological, Skin, Genitourinary, Musculoskeletal, HENT, Endocrine, Cardiovascular, Respiratory, Allergic/Imm, Heme/Lymph   Last edited by [name], COA on 11/28/2020  1:10 PM. (History)       ALLERGIES No Known Allergies  PAST MEDICAL HISTORY Past Medical History:  Diagnosis Date  . Cataract    Mixed form OU  .  CIN I (cervical intraepithelial neoplasia I)   . Depression   . High risk HPV infection   . Hypertension   . Hypertensive retinopathy    OU  . Missed ab AGUST 6 2014  . Retina disorder 10/22/2019   pt states taking inspra for excess fluid in retina  . Sciatic nerve injury    Past Surgical History:  Procedure Laterality Date  . MYOMECTOMY  2008    FAMILY HISTORY Family History  Problem Relation Age of Onset  . Hypertension Mother   . Diabetes Father   . Diabetes Maternal Grandmother   . Hypertension Maternal Grandmother   . Colon cancer Neg Hx   . Colon polyps Neg Hx   . Esophageal cancer Neg Hx   . Rectal cancer Neg Hx   . Stomach cancer Neg Hx     SOCIAL HISTORY Social History   Tobacco Use  . Smoking status: Never Smoker  . Smokeless tobacco: Never Used  Vaping Use  . Vaping Use: Never used  Substance Use Topics  . Alcohol use: No    Alcohol/week: 0.0 standard drinks  . Drug use: No         OPHTHALMIC EXAM:  Base Eye Exam    Visual Acuity (Snellen - Linear)      Right Left   Dist cc 20/70 -2 20/20 -1   Dist ph cc NI  Tonometry (Tonopen, 1:08 PM)      Right Left   Pressure 18 13       Pupils      Dark Light Shape React APD   Right 4 3 Round Brisk None   Left 4 3 Round Brisk None       Visual Fields (Counting fingers)      Left Right    Full Full       Extraocular Movement      Right Left    Full Full       Neuro/Psych    Oriented x3: Yes   Mood/Affect: Normal       Dilation    Both eyes: 1.0% Mydriacyl, 2.5% Phenylephrine @ 1:08 PM        Slit Lamp and Fundus Exam    Slit Lamp Exam      Right Left   Lids/Lashes Dermatochalasis - upper lid Dermatochalasis - upper lid   Conjunctiva/Sclera Injection, nasal pterygium 1+ Injection, nasal Pinguecula   Cornea Nasal ptergyium extending 2.44mm onto nasal cornea 1+Punctate epithelial erosions   Anterior Chamber Deep, clear, narrow temporal angle, No cell or flare deep, narrow  temporal angle, 1+pigment   Iris Round and dilated Round and dilated   Lens 2+ Nuclear sclerosis, 2+ Cortical cataract 2+ Nuclear sclerosis, 2+ Cortical cataract   Vitreous Mild Vitreous syneresis, Posterior vitreous detachment, vitreous condensations Mild Vitreous syneresis       Fundus Exam      Right Left   Disc Pink and Sharp, +cupping Pink and Sharp, +cupping   C/D Ratio 0.65 0.6   Macula Flat, Blunted foveal reflex, persistent nasal SRF -- ?slight increase centrally, RPE mottling, clumping and atrophy, No heme  Flat, good foveal reflex, Epiretinal membrane, mild RPE mottling and clumping superiorly, No heme or edema   Vessels Vascular attenuation, mild Tortuousity Vascular attenuation, mild Tortuousity   Periphery Attached, bullous Retinoschisis cavity from 0700-0830 -- no associated RT/RD - stable from prior, shallow schisis cavity ST, mild peripheral cystoid degeneration Attached, temporal retinoschisis cavity peripheral -- stable from prior        Refraction    Wearing Rx      Sphere Cylinder Axis   Right +1.25 +1.25 112   Left +2.25 +1.00 101          IMAGING AND PROCEDURES  Imaging and Procedures for @TODAY @  OCT, Retina - OU - Both Eyes       Right Eye Quality was good. Central Foveal Thickness: 262. Progression has been stable. Findings include normal foveal contour, no IRF, outer retinal atrophy, vitreomacular adhesion  (Interval improvement in SRF centrally, persistent SRF nasal macula, IT schisis caught on widefield).   Left Eye Quality was good. Central Foveal Thickness: 297. Progression has been stable. Findings include normal foveal contour, no IRF, no SRF, vitreomacular adhesion .   Notes *Images captured and stored on drive  Diagnosis / Impression:  OD: Interval improvement in SRF centrally, persistent SRF nasal macula, IT schisis caught on widefield OS: NFP, no IRF/SRF, VMA  Clinical management:  See below  Abbreviations: NFP - Normal foveal  profile. CME - cystoid macular edema. PED - pigment epithelial detachment. IRF - intraretinal fluid. SRF - subretinal fluid. EZ - ellipsoid zone. ERM - epiretinal membrane. ORA - outer retinal atrophy. ORT - outer retinal tubulation. SRHM - subretinal hyper-reflective material        Intravitreal Injection, Pharmacologic Agent - OD - Right Eye  Time Out 11/28/2020. 1:25 PM. Confirmed correct patient, procedure, site, and patient consented.   Anesthesia Topical anesthesia was used. Anesthetic medications included Lidocaine 2%, Proparacaine 0.5%.   Procedure Preparation included 5% betadine to ocular surface, eyelid speculum. A supplied needle was used.   Injection:  2 mg aflibercept Alfonse Flavors) SOLN   NDC: A3590391, Lot: [id number], Expiration date: 03/09/2021   Route: Intravitreal, Site: Right Eye, Waste: 0.05 mL  Post-op Post injection exam found visual acuity of at least counting fingers. The patient tolerated the procedure well. There were no complications. The patient received written and verbal post procedure care education. Post injection medications were not given.                 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 Exudative age-related macular degeneration of right eye with active choroidal neovascularization (HCC)  H35.3211 Intravitreal Injection, Pharmacologic Agent - OD - Right Eye    aflibercept (EYLEA) SOLN 2 mg  3. 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 Essential hypertension  I10   6. Hypertensive retinopathy of both eyes  H35.033   7. 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 CSCR OD -- likely chronic  - pt reports decreased vision OD since December 2019  - reports significant stressors in life  - denies steroid  use  - FA (09.30.20) shows focal hyperfluorescent leakage points nasal macula OD -- expansile dot phenotype?  - started 50 mg po eplerenone since 09.30.20  -- pt self d/c in February, re-started in June 2021  - s/p IVA #1 (09.01.21), #2 (09.29.21), #3 (11.03.21), #4 (12.03.21) -- IVA resistance  - s/p IVE OD #1 (01.20.22)  - BCVA 20/70 OD (stable)  - OCT shows mild interval improvement in central SRF, persistent SRF nasal macula  - differential includes atypical exudative ARMD  - recommend IVE OD #2 today, 02.18.22  - RBA of procedure discussed, questions answered  - IVA informed consent obtained and signed 09.01.21  - IVE informed consent obtained and signed, 01.20.22  - see procedure note  - recommend cont po eplerenone 50 mg daily  - Eylea4U benefits investigation initiated 12.3.21 -- approved for 2022  - f/u 4 weeks -- DFE/OCT, possible injection  4. Retinoschisis OU (OD > OS) -- stable  - bullous retinoschisis cavity IT quadrant OD-- 3716-9678 oclock  - shallow retinoschisis cavity IT quadrant OS  - no associated retinal holes/RT/RD on scleral depression  - discussed findings and prognosis   - recommend observation -- consider laser retinopexy if schisis progresses or threatens macula or develops RD  5,6. Hypertensive retinopathy OU  - discussed importance of tight BP control  - monitor  7. 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  - discussed diagnosis and progression  - not yet visually significant  - monitor for now   Ophthalmic Meds Ordered this visit:  Meds ordered this encounter  Medications  . aflibercept (EYLEA) SOLN 2 mg       Return in about 4 weeks (around 12/26/2020) for CSCR OD - Dilated Exam, OCT, Possible Injxn.  There are no Patient Instructions on file for this visit.  Explained the diagnoses, plan, and follow up with the patient and they expressed  understanding.  Patient expressed understanding of the importance of proper follow up care.   This document serves as a record of services personally performed by Gardiner Sleeper, MD, PhD. It was created on their behalf by San Jetty. Owens Shark, OA an ophthalmic technician. The creation of this record is the provider's dictation and/or activities during the visit.    Electronically signed by: San Jetty. Owens Shark, [place] 02.16.2022 4:34 PM   Gardiner Sleeper, M.D., Ph.D. Diseases & Surgery of the Retina and Vitreous [facility]  I have reviewed the above documentation for accuracy and completeness, and I agree with the above. Gardiner Sleeper, M.D., Ph.D. 11/28/20 4:34 PM  Abbreviations: M myopia (nearsighted); A astigmatism; H hyperopia (farsighted); P presbyopia; Mrx spectacle prescription;  CTL contact lenses; OD right eye; OS left eye; OU both eyes  XT exotropia; ET esotropia; PEK punctate epithelial keratitis; PEE punctate epithelial erosions; DES dry eye syndrome; MGD meibomian gland dysfunction; ATs artificial tears; PFAT's preservative free artificial tears; [place] nuclear sclerotic cataract; PSC posterior subcapsular cataract; ERM epi-retinal membrane; PVD posterior vitreous detachment; RD retinal detachment; DM diabetes mellitus; DR diabetic retinopathy; NPDR non-proliferative diabetic retinopathy; PDR proliferative diabetic retinopathy; CSME clinically significant macular edema; DME diabetic macular edema; dbh dot blot hemorrhages; CWS cotton wool spot; POAG primary open angle glaucoma; C/D cup-to-disc ratio; HVF humphrey visual field; GVF goldmann visual field; OCT optical coherence tomography; IOP intraocular pressure; BRVO Branch retinal vein occlusion; CRVO central retinal vein occlusion; CRAO central retinal artery occlusion; BRAO branch retinal artery occlusion; RT retinal tear; SB scleral buckle; PPV pars plana vitrectomy; VH Vitreous hemorrhage; PRP panretinal laser  photocoagulation; IVK intravitreal kenalog; VMT vitreomacular traction; MH Macular hole;  NVD neovascularization of the disc; NVE neovascularization elsewhere; AREDS age related eye disease study; ARMD age related macular degeneration; POAG primary open angle glaucoma; EBMD epithelial/anterior basement membrane dystrophy; ACIOL anterior chamber intraocular lens; IOL intraocular lens; PCIOL posterior chamber intraocular lens; Phaco/IOL phacoemulsification with intraocular lens placement; [place] photorefractive keratectomy; LASIK laser assisted in situ keratomileusis; HTN hypertension; DM diabetes mellitus; COPD chronic obstructive pulmonary disease

## 2020-11-28 ENCOUNTER — Ambulatory Visit (INDEPENDENT_AMBULATORY_CARE_PROVIDER_SITE_OTHER): Payer: Commercial Managed Care - PPO | Admitting: Ophthalmology

## 2020-11-28 ENCOUNTER — Encounter (INDEPENDENT_AMBULATORY_CARE_PROVIDER_SITE_OTHER): Payer: Self-pay | Admitting: Ophthalmology

## 2020-11-28 ENCOUNTER — Other Ambulatory Visit: Payer: Self-pay

## 2020-11-28 DIAGNOSIS — H25813 Combined forms of age-related cataract, bilateral: Secondary | ICD-10-CM

## 2020-11-28 DIAGNOSIS — I10 Essential (primary) hypertension: Secondary | ICD-10-CM

## 2020-11-28 DIAGNOSIS — H353211 Exudative age-related macular degeneration, right eye, with active choroidal neovascularization: Secondary | ICD-10-CM

## 2020-11-28 DIAGNOSIS — H35033 Hypertensive retinopathy, bilateral: Secondary | ICD-10-CM

## 2020-11-28 DIAGNOSIS — H35711 Central serous chorioretinopathy, right eye: Secondary | ICD-10-CM | POA: Diagnosis not present

## 2020-11-28 DIAGNOSIS — H3581 Retinal edema: Secondary | ICD-10-CM | POA: Diagnosis not present

## 2020-11-28 DIAGNOSIS — H33103 Unspecified retinoschisis, bilateral: Secondary | ICD-10-CM

## 2020-11-28 MED ORDER — AFLIBERCEPT 2MG/0.05ML IZ SOLN FOR KALEIDOSCOPE
2.0000 mg | INTRAVITREAL | Status: AC | PRN
  Administered 2020-11-28: 2 mg via INTRAVITREAL

## 2020-12-03 ENCOUNTER — Other Ambulatory Visit: Payer: Self-pay

## 2020-12-03 DIAGNOSIS — R87612 Low grade squamous intraepithelial lesion on cytologic smear of cervix (LGSIL): Secondary | ICD-10-CM

## 2020-12-25 NOTE — Progress Notes (Shared)
[facility] Note  12/29/2020     CHIEF COMPLAINT Patient presents for No chief complaint on file.   HISTORY OF PRESENT ILLNESS: [name] is a 50 y.o. female who presents to the clinic today for:   Pt states no change in vision  Referring physician: No referring provider defined for this encounter.  HISTORICAL INFORMATION:   Selected notes from the MEDICAL RECORD NUMBER Referred by [name] for concern of sub-macular fluid / retinoschisis   CURRENT MEDICATIONS: No current outpatient medications on file. (Ophthalmic Drugs)   No current facility-administered medications for this visit. (Ophthalmic Drugs)   Current Outpatient Medications (Other)  Medication Sig  . eplerenone (INSPRA) 50 MG tablet Take 1 tablet (50 mg total) by mouth daily.  . hydrochlorothiazide (HYDRODIURIL) 25 MG tablet TAKE 1 TABLET BY MOUTH ONCE DAILY IN THE MORNING FOR 90 DAYS  . lisinopril (PRINIVIL,ZESTRIL) 20 MG tablet Take 1 tablet (20 mg total) by mouth daily.   No current facility-administered medications for this visit. (Other)      REVIEW OF SYSTEMS:    ALLERGIES No Known Allergies  PAST MEDICAL HISTORY Past Medical History:  Diagnosis Date  . Cataract    Mixed form OU  . CIN I (cervical intraepithelial neoplasia I)   . Depression   . High risk HPV infection   . Hypertension   . Hypertensive retinopathy    OU  . Missed ab AGUST 6 2014  . Retina disorder 10/22/2019   pt states taking inspra for excess fluid in retina  . Sciatic nerve injury    Past Surgical History:  Procedure Laterality Date  . MYOMECTOMY  2008    FAMILY HISTORY Family History  Problem Relation Age of Onset  . Hypertension Mother   . Diabetes Father   . Diabetes Maternal Grandmother   . Hypertension Maternal Grandmother   . Colon cancer Neg Hx   . Colon polyps Neg Hx   . Esophageal cancer Neg Hx   . Rectal cancer Neg Hx   . Stomach cancer Neg Hx      SOCIAL HISTORY Social History   Tobacco Use  . Smoking status: Never Smoker  . Smokeless tobacco: Never Used  Vaping Use  . Vaping Use: Never used  Substance Use Topics  . Alcohol use: No    Alcohol/week: 0.0 standard drinks  . 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 Exudative age-related macular degeneration of right eye with active choroidal neovascularization ([place])  H35.3211   3. Retinal edema  H35.81   4. 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 Essential hypertension  I10   6. Hypertensive retinopathy of both eyes  H35.033   7. 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 CSCR OD -- likely chronic  - pt reports decreased vision OD since December 2019  - reports significant stressors in life  - denies steroid use  - FA (09.30.20) shows focal hyperfluorescent leakage points nasal macula OD -- expansile dot phenotype?  - started 50 mg po eplerenone since 09.30.20  -- pt self d/c in February, re-started in June 2021  - s/p IVA #1 (  09.01.21), #2 (09.29.21), #3 (11.03.21), #4 (12.03.21) -- IVA resistance  - s/p IVE OD #1 (01.20.22), #2 (02.18.22)  - BCVA 20/70 OD (stable)  - OCT shows mild interval improvement in central SRF, persistent SRF nasal macula  - differential includes atypical exudative ARMD  - recommend IVE OD #3 today, 03.21.22  - RBA of procedure discussed, questions answered  - IVA informed consent obtained and signed 09.01.21  - IVE informed consent obtained and signed, 01.20.22  - see procedure note  - recommend cont po eplerenone 50 mg daily  - Eylea4U benefits investigation initiated 12.3.21 -- approved for 2022  - f/u 4 weeks -- DFE/OCT, possible injection  4. Retinoschisis OU (OD > OS) -- stable  - bullous retinoschisis cavity IT  quadrant OD-- 1610-9604 oclock  - shallow retinoschisis cavity IT quadrant OS  - no associated retinal holes/RT/RD on scleral depression  - discussed findings and prognosis   - recommend observation -- consider laser retinopexy if schisis progresses or threatens macula or develops RD  5,6. Hypertensive retinopathy OU  - discussed importance of tight BP control  - monitor  7. 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  - discussed diagnosis and progression  - not yet visually significant  - monitor for now   Ophthalmic Meds Ordered this visit:  No orders of the defined types were placed in this encounter.      No follow-ups on file.  There are no Patient Instructions on file for this visit.   This document serves as a record of services personally performed by Gardiner Sleeper, MD, PhD. It was created on their behalf by Leeann Must, [place], an ophthalmic technician. The creation of this record is the provider's dictation and/or activities during the visit.    Electronically signed by: Leeann Must, COA @TODAY @ 12:36 PM  Abbreviations: M myopia (nearsighted); A astigmatism; H hyperopia (farsighted); P presbyopia; Mrx spectacle prescription;  CTL contact lenses; OD right eye; OS left eye; OU both eyes  XT exotropia; ET esotropia; PEK punctate epithelial keratitis; PEE punctate epithelial erosions; DES dry eye syndrome; MGD meibomian gland dysfunction; ATs artificial tears; PFAT's preservative free artificial tears; [place] nuclear sclerotic cataract; PSC posterior subcapsular cataract; ERM epi-retinal membrane; PVD posterior vitreous detachment; RD retinal detachment; DM diabetes mellitus; DR diabetic retinopathy; NPDR non-proliferative diabetic retinopathy; PDR proliferative diabetic retinopathy; CSME clinically significant macular edema; DME diabetic macular edema; dbh dot blot hemorrhages; CWS  cotton wool spot; POAG primary open angle glaucoma; C/D cup-to-disc ratio; HVF humphrey visual field; GVF goldmann visual field; OCT optical coherence tomography; IOP intraocular pressure; BRVO Branch retinal vein occlusion; CRVO central retinal vein occlusion; CRAO central retinal artery occlusion; BRAO branch retinal artery occlusion; RT retinal tear; SB scleral buckle; PPV pars plana vitrectomy; VH Vitreous hemorrhage; PRP panretinal laser photocoagulation; IVK intravitreal kenalog; VMT vitreomacular traction; MH Macular hole;  NVD neovascularization of the disc; NVE neovascularization elsewhere; AREDS age related eye disease study; ARMD age related macular degeneration; POAG primary open angle glaucoma; EBMD epithelial/anterior basement membrane dystrophy; ACIOL anterior chamber intraocular lens; IOL intraocular lens; PCIOL posterior chamber intraocular lens; Phaco/IOL phacoemulsification with intraocular lens placement; [place] photorefractive keratectomy; LASIK laser assisted in situ keratomileusis; HTN hypertension; DM diabetes mellitus; COPD chronic obstructive pulmonary disease

## 2020-12-26 ENCOUNTER — Encounter (INDEPENDENT_AMBULATORY_CARE_PROVIDER_SITE_OTHER): Payer: Commercial Managed Care - PPO | Admitting: Ophthalmology

## 2020-12-29 ENCOUNTER — Encounter (INDEPENDENT_AMBULATORY_CARE_PROVIDER_SITE_OTHER): Payer: Commercial Managed Care - PPO | Admitting: Ophthalmology

## 2020-12-29 DIAGNOSIS — H3581 Retinal edema: Secondary | ICD-10-CM

## 2020-12-29 DIAGNOSIS — H35033 Hypertensive retinopathy, bilateral: Secondary | ICD-10-CM

## 2020-12-29 DIAGNOSIS — H353211 Exudative age-related macular degeneration, right eye, with active choroidal neovascularization: Secondary | ICD-10-CM

## 2020-12-29 DIAGNOSIS — I10 Essential (primary) hypertension: Secondary | ICD-10-CM

## 2020-12-29 DIAGNOSIS — H33103 Unspecified retinoschisis, bilateral: Secondary | ICD-10-CM

## 2020-12-29 DIAGNOSIS — H25813 Combined forms of age-related cataract, bilateral: Secondary | ICD-10-CM

## 2020-12-29 DIAGNOSIS — H35711 Central serous chorioretinopathy, right eye: Secondary | ICD-10-CM

## 2021-01-09 NOTE — Progress Notes (Signed)
[facility] Note  01/13/2021     CHIEF COMPLAINT Patient presents for Retina Follow Up   HISTORY OF PRESENT ILLNESS: [name] is a 50 y.o. female who presents to the clinic today for:  HPI    Retina Follow Up    Patient presents with  Other.  In right eye.  This started 6 weeks ago.  I, the attending physician,  performed the HPI with the patient and updated documentation appropriately.          Comments    Pt here for follow up retinal eval CSCR OD. Was supposed to be seen 3/18 but had to r/s due to illness. Vision is stable, no eye pain reported.         Last edited by [name], MD on 01/13/2021  8:51 PM. (History)      Referring physician: Shawnie Dapper, DO [address] [place],  [place] [id number]  HISTORICAL INFORMATION:   Selected notes from the MEDICAL RECORD NUMBER Referred by [name] for concern of sub-macular fluid / retinoschisis   CURRENT MEDICATIONS: No current outpatient medications on file. (Ophthalmic Drugs)   No current facility-administered medications for this visit. (Ophthalmic Drugs)   Current Outpatient Medications (Other)  Medication Sig  . eplerenone (INSPRA) 50 MG tablet Take 1 tablet (50 mg total) by mouth daily.  . hydrochlorothiazide (HYDRODIURIL) 25 MG tablet TAKE 1 TABLET BY MOUTH ONCE DAILY IN THE MORNING FOR 90 DAYS  . lisinopril (PRINIVIL,ZESTRIL) 20 MG tablet Take 1 tablet (20 mg total) by mouth daily.   No current facility-administered medications for this visit. (Other)      REVIEW OF SYSTEMS: ROS    Positive for: Eyes, Psychiatric   Negative for: Constitutional, Gastrointestinal, Neurological, Skin, Genitourinary, Musculoskeletal, HENT, Endocrine, Cardiovascular, Respiratory, Allergic/Imm, Heme/Lymph   Last edited by Kingsley Spittle, COT on 01/13/2021  3:01 PM. (History)       ALLERGIES No Known Allergies  PAST MEDICAL HISTORY Past Medical History:   Diagnosis Date  . Cataract    Mixed form OU  . CIN I (cervical intraepithelial neoplasia I)   . Depression   . High risk HPV infection   . Hypertension   . Hypertensive retinopathy    OU  . Missed ab AGUST 6 2014  . Retina disorder 10/22/2019   pt states taking inspra for excess fluid in retina  . Sciatic nerve injury    Past Surgical History:  Procedure Laterality Date  . MYOMECTOMY  2008    FAMILY HISTORY Family History  Problem Relation Age of Onset  . Hypertension Mother   . Diabetes Father   . Diabetes Maternal Grandmother   . Hypertension Maternal Grandmother   . Colon cancer Neg Hx   . Colon polyps Neg Hx   . Esophageal cancer Neg Hx   . Rectal cancer Neg Hx   . Stomach cancer Neg Hx     SOCIAL HISTORY Social History   Tobacco Use  . Smoking status: Never Smoker  . Smokeless tobacco: Never Used  Vaping Use  . Vaping Use: Never used  Substance Use Topics  . Alcohol use: No    Alcohol/week: 0.0 standard drinks  . Drug use: No         OPHTHALMIC EXAM:  Base Eye Exam    Visual Acuity (Snellen - Linear)      Right Left   Dist cc 20/100 20/25 +2   Dist  ph cc 20/80 -2 20/20 -3   Correction: Glasses       Tonometry (Tonopen, 3:10 PM)      Right Left   Pressure 14 14       Pupils      Dark Light Shape React APD   Right 4 3 Round Brisk None   Left 4 3 Round Brisk None       Visual Fields (Counting fingers)      Left Right    Full Full       Extraocular Movement      Right Left    Full, Ortho Full, Ortho       Neuro/Psych    Oriented x3: Yes   Mood/Affect: Normal       Dilation    Both eyes: 1.0% Mydriacyl, 2.5% Phenylephrine @ 3:11 PM        Slit Lamp and Fundus Exam    Slit Lamp Exam      Right Left   Lids/Lashes Dermatochalasis - upper lid Dermatochalasis - upper lid   Conjunctiva/Sclera Injection, nasal pterygium 1+ Injection, nasal Pinguecula   Cornea Nasal ptergyium extending 2.74mm onto nasal cornea 1+Punctate  epithelial erosions   Anterior Chamber Deep, clear, narrow temporal angle, No cell or flare deep, narrow temporal angle, 1+pigment   Iris Round and dilated Round and dilated   Lens 2+ Nuclear sclerosis, 2+ Cortical cataract 2+ Nuclear sclerosis, 2+ Cortical cataract   Vitreous Mild Vitreous syneresis, Posterior vitreous detachment, vitreous condensations Mild Vitreous syneresis       Fundus Exam      Right Left   Disc Pink and Sharp, +cupping Pink and Sharp, +cupping   C/D Ratio 0.65 0.6   Macula Flat, Blunted foveal reflex, interval improvement in SRF, just trace SRF remains, RPE mottling, clumping and atrophy, No heme  Flat, good foveal reflex, Epiretinal membrane, mild RPE mottling and clumping superiorly, No heme or edema   Vessels Vascular attenuation, mild Tortuousity Vascular attenuation, mild Tortuousity   Periphery Attached, bullous Retinoschisis cavity from 0700-0830 -- no associated RT/RD - stable from prior, shallow schisis cavity ST, mild peripheral cystoid degeneration Attached, temporal retinoschisis cavity peripheral -- stable from prior        Refraction    Wearing Rx      Sphere Cylinder Axis   Right +1.25 +1.25 112   Left +2.25 +1.00 101          IMAGING AND PROCEDURES  Imaging and Procedures for @TODAY @  OCT, Retina - OU - Both Eyes       Right Eye Quality was good. Central Foveal Thickness: 230. Progression has improved. Findings include normal foveal contour, no IRF, outer retinal atrophy, vitreomacular adhesion  (Interval improvement in SRF, trace SRF inferior and nasal fovea, IT schisis caught on widefield).   Left Eye Quality was good. Central Foveal Thickness: 294. Progression has improved. Findings include normal foveal contour, no IRF, vitreomacular adhesion  (Focal SRF nasal to disc caught on widefield).   Notes *Images captured and stored on drive  Diagnosis / Impression:  OD: Interval improvement in SRF, trace SRF inferior and nasal fovea, IT  schisis caught on widefield OS: NFP, no IRF/SRF centrally, +VMA; Focal SRF nasal to disc caught on widefield  Clinical management:  See below  Abbreviations: NFP - Normal foveal profile. CME - cystoid macular edema. PED - pigment epithelial detachment. IRF - intraretinal fluid. SRF - subretinal fluid. EZ - ellipsoid zone. ERM - epiretinal membrane. ORA -  outer retinal atrophy. ORT - outer retinal tubulation. SRHM - subretinal hyper-reflective material        Intravitreal Injection, Pharmacologic Agent - OD - Right Eye       Time Out 01/13/2021. 3:41 PM. Confirmed correct patient, procedure, site, and patient consented.   Anesthesia Topical anesthesia was used. Anesthetic medications included Lidocaine 2%, Proparacaine 0.5%.   Procedure Preparation included 5% betadine to ocular surface, eyelid speculum. A (32g) needle was used.   Injection:  2 mg aflibercept Alfonse Flavors) SOLN   NDC: 31517-616-07, Lot: [id number], Expiration date: 05/10/2021   Route: Intravitreal, Site: Right Eye, Waste: 0.05 mL  Post-op Post injection exam found visual acuity of at least counting fingers. The patient tolerated the procedure well. There were no complications. The patient received written and verbal post procedure care education. Post injection medications were not given.   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 Exudative age-related macular degeneration of right eye with active choroidal neovascularization (HCC)  H35.3211 Intravitreal Injection, Pharmacologic Agent - OD - Right Eye    aflibercept (EYLEA) SOLN 2 mg  3. 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 Essential hypertension  I10   6. Hypertensive retinopathy of both eyes  H35.033    7. 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 CSCR OD -- likely chronic  - delayed f/u from 4 wks to 6 wks due to illness  - pt reports decreased vision OD since December 2019  - reports significant stressors in life  - denies steroid use  - FA (09.30.20) shows focal hyperfluorescent leakage points nasal macula OD -- expansile dot phenotype?  - started 50 mg po eplerenone since 09.30.20  -- pt self d/c in February, re-started in June 2021  - s/p IVA #1 (09.01.21), #2 (09.29.21), #3 (11.03.21), #4 (12.03.21) -- IVA resistance  - s/p IVE OD #1 (01.20.22), #2 (02.18.22)  - BCVA 20/70 OD (stable)  - OCT shows interval improvement in central SRF--just tr SRF remains  - differential includes atypical exudative ARMD  - recommend IVE OD #3 (sample) today, 04.05.22  - RBA of procedure discussed, questions answered  - IVA informed consent obtained and signed 09.01.21  - IVE informed consent obtained and signed, 01.20.22  - see procedure note  - recommend cont po eplerenone 50 mg daily  - Eylea4U benefits investigation initiated 12.3.21 -- approved for 2022 -- Eylea4U benefits investigation re-started, 04.05.22, per Lenna Sciara -- will back date application to December  - f/u 4 weeks -- DFE/OCT, possible injection  4. Retinoschisis OU (OD > OS) -- stable  - bullous retinoschisis cavity IT quadrant OD-- 5462-7035 oclock  - shallow retinoschisis cavity IT quadrant OS  - no associated retinal holes/RT/RD on scleral depression  - discussed findings and prognosis   - recommend observation -- consider laser retinopexy if schisis progresses or threatens macula or develops RD  5,6. Hypertensive retinopathy OU  - discussed importance of tight BP control  - monitor  7. 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  - discussed  diagnosis and progression  - not yet visually  significant  - monitor for now   Ophthalmic Meds Ordered this visit:  Meds ordered this encounter  Medications  . aflibercept (EYLEA) SOLN 2 mg       Return in about 4 weeks (around 02/10/2021) for f/u CSR OD, DFE, OCT.  There are no Patient Instructions on file for this visit.   Explained the diagnoses, plan, and follow up with the patient and they expressed understanding.  Patient expressed understanding of the importance of proper follow up care.   This document serves as a record of services personally performed by Gardiner Sleeper, MD, PhD. It was created on their behalf by Estill Bakes, COT an ophthalmic technician. The creation of this record is the provider's dictation and/or activities during the visit.    Electronically signed by: Estill Bakes, COT 4.1.22 @ 8:55 PM   This document serves as a record of services personally performed by Gardiner Sleeper, MD, PhD. It was created on their behalf by San Jetty. Owens Shark, OA an ophthalmic technician. The creation of this record is the provider's dictation and/or activities during the visit.    Electronically signed by: San Jetty. Owens Shark, [place] 04.05.2022 8:55 PM  Gardiner Sleeper, M.D., Ph.D. Diseases & Surgery of the Retina and [place] 01/13/2021   I have reviewed the above documentation for accuracy and completeness, and I agree with the above. Gardiner Sleeper, M.D., Ph.D. 01/13/21 8:55 PM  Abbreviations: M myopia (nearsighted); A astigmatism; H hyperopia (farsighted); P presbyopia; Mrx spectacle prescription;  CTL contact lenses; OD right eye; OS left eye; OU both eyes  XT exotropia; ET esotropia; PEK punctate epithelial keratitis; PEE punctate epithelial erosions; DES dry eye syndrome; MGD meibomian gland dysfunction; ATs artificial tears; PFAT's preservative free artificial tears; [place] nuclear sclerotic cataract; PSC posterior subcapsular cataract; ERM epi-retinal membrane; PVD posterior vitreous  detachment; RD retinal detachment; DM diabetes mellitus; DR diabetic retinopathy; NPDR non-proliferative diabetic retinopathy; PDR proliferative diabetic retinopathy; CSME clinically significant macular edema; DME diabetic macular edema; dbh dot blot hemorrhages; CWS cotton wool spot; POAG primary open angle glaucoma; C/D cup-to-disc ratio; HVF humphrey visual field; GVF goldmann visual field; OCT optical coherence tomography; IOP intraocular pressure; BRVO Branch retinal vein occlusion; CRVO central retinal vein occlusion; CRAO central retinal artery occlusion; BRAO branch retinal artery occlusion; RT retinal tear; SB scleral buckle; PPV pars plana vitrectomy; VH Vitreous hemorrhage; PRP panretinal laser photocoagulation; IVK intravitreal kenalog; VMT vitreomacular traction; MH Macular hole;  NVD neovascularization of the disc; NVE neovascularization elsewhere; AREDS age related eye disease study; ARMD age related macular degeneration; POAG primary open angle glaucoma; EBMD epithelial/anterior basement membrane dystrophy; ACIOL anterior chamber intraocular lens; IOL intraocular lens; PCIOL posterior chamber intraocular lens; Phaco/IOL phacoemulsification with intraocular lens placement; [place] photorefractive keratectomy; LASIK laser assisted in situ keratomileusis; HTN hypertension; DM diabetes mellitus; COPD chronic obstructive pulmonary disease

## 2021-01-12 ENCOUNTER — Other Ambulatory Visit: Payer: Self-pay

## 2021-01-12 ENCOUNTER — Ambulatory Visit
Admission: RE | Admit: 2021-01-12 | Discharge: 2021-01-12 | Disposition: A | Discharge status: Home / Self Care | Payer: Commercial Managed Care - PPO | Source: Ambulatory Visit | Attending: Obstetrics & Gynecology | Admitting: Obstetrics & Gynecology

## 2021-01-12 DIAGNOSIS — Z1231 Encounter for screening mammogram for malignant neoplasm of breast: Secondary | ICD-10-CM

## 2021-01-13 ENCOUNTER — Ambulatory Visit (INDEPENDENT_AMBULATORY_CARE_PROVIDER_SITE_OTHER): Payer: Commercial Managed Care - PPO | Admitting: Ophthalmology

## 2021-01-13 ENCOUNTER — Other Ambulatory Visit: Payer: Self-pay

## 2021-01-13 ENCOUNTER — Encounter (INDEPENDENT_AMBULATORY_CARE_PROVIDER_SITE_OTHER): Payer: Self-pay | Admitting: Ophthalmology

## 2021-01-13 DIAGNOSIS — H33103 Unspecified retinoschisis, bilateral: Secondary | ICD-10-CM

## 2021-01-13 DIAGNOSIS — H35711 Central serous chorioretinopathy, right eye: Secondary | ICD-10-CM

## 2021-01-13 DIAGNOSIS — I10 Essential (primary) hypertension: Secondary | ICD-10-CM

## 2021-01-13 DIAGNOSIS — H3581 Retinal edema: Secondary | ICD-10-CM | POA: Diagnosis not present

## 2021-01-13 DIAGNOSIS — H353211 Exudative age-related macular degeneration, right eye, with active choroidal neovascularization: Secondary | ICD-10-CM | POA: Diagnosis not present

## 2021-01-13 DIAGNOSIS — H35033 Hypertensive retinopathy, bilateral: Secondary | ICD-10-CM

## 2021-01-13 DIAGNOSIS — H25813 Combined forms of age-related cataract, bilateral: Secondary | ICD-10-CM

## 2021-01-13 MED ORDER — AFLIBERCEPT 2MG/0.05ML IZ SOLN FOR KALEIDOSCOPE
2.0000 mg | INTRAVITREAL | Status: AC | PRN
  Administered 2021-01-13: 2 mg via INTRAVITREAL

## 2021-01-19 ENCOUNTER — Encounter: Payer: Self-pay | Admitting: Obstetrics & Gynecology

## 2021-01-19 ENCOUNTER — Other Ambulatory Visit: Payer: Self-pay

## 2021-01-19 ENCOUNTER — Ambulatory Visit (INDEPENDENT_AMBULATORY_CARE_PROVIDER_SITE_OTHER): Payer: Commercial Managed Care - PPO | Admitting: Obstetrics & Gynecology

## 2021-01-19 ENCOUNTER — Other Ambulatory Visit (HOSPITAL_COMMUNITY)
Admission: RE | Admit: 2021-01-19 | Discharge: 2021-01-19 | Disposition: A | Discharge status: Home / Self Care | Payer: Commercial Managed Care - PPO | Source: Ambulatory Visit | Attending: Obstetrics & Gynecology | Admitting: Obstetrics & Gynecology

## 2021-01-19 DIAGNOSIS — R87612 Low grade squamous intraepithelial lesion on cytologic smear of cervix (LGSIL): Secondary | ICD-10-CM | POA: Insufficient documentation

## 2021-01-19 NOTE — Progress Notes (Signed)
    [name] 09-20-1971 [id number]        50 y.o.  G1P0A1   RP: LGSIL for Colpo  HPI: Pap 11/20/2020 LGSIL.  H/O Colpo 12/2019 No dysplasia.  HPV 16-18-45 Negative.   OB History  Gravida Para Term Preterm AB Living  1 0     1 0  SAB IAB Ectopic Multiple Live Births  1            # Outcome Date GA Lbr Len/2nd Weight Sex Delivery Anes PTL Lv  1 SAB             Past medical history,surgical history, problem list, medications, allergies, family history and social history were all reviewed and documented in the EPIC chart.   Directed ROS with pertinent positives and negatives documented in the history of present illness/assessment and plan.  Exam:  Vitals:   01/19/21 1348  BP: 120/78   General appearance:  Normal  Colposcopy Procedure Note [name] 01/19/2021  Indications:  LGSIL for colpo  Procedure Details  The risks and benefits of the procedure and Verbal informed consent obtained.  Speculum placed in vagina and excellent visualization of cervix achieved, cervix swabbed x 3 with acetic acid solution.  Findings:  Cervix colposcopy: Physical Exam Genitourinary:       Vaginal colposcopy: Normal  Vulvar colposcopy: Normal  Perirectal colposcopy: Normal  The cervix was sprayed with Hurricane before performing the cervical biopsies.  Specimens: Cervical Bx at 11 O'Clock  Complications: None, good hemostasis with Silver Nitrate . Plan:  Management per results    Assessment/Plan:  50 y.o. G1P0010   1. LGSIL on Pap smear of cervix LGSIL on Pap test November 20, 2020.  Colposcopy March 2021 showed no dysplasia.  HPV 16-18-45 were negative at that time.  Colposcopy findings reviewed with patient.  Management per cervical biopsy results.  Postprocedure precautions reviewed. - Colposcopy  Princess Bruins MD, 2:02 PM 01/19/2021

## 2021-01-21 LAB — SURGICAL PATHOLOGY

## 2021-02-25 NOTE — Progress Notes (Signed)
[facility] Note  03/02/2021     CHIEF COMPLAINT Patient presents for Retina Follow Up   HISTORY OF PRESENT ILLNESS: [name] is a 50 y.o. female who presents to the clinic today for:  HPI    Retina Follow Up    Patient presents with  Other.  In right eye.  This started weeks ago.  Severity is moderate.  Duration of weeks.  Since onset it is stable.  I, the attending physician,  performed the HPI with the patient and updated documentation appropriately.          Comments    Pt states her vision is about the same OU.  Pt denies eye pain.  Pt denies any new or worsening floaters or flashes of light OU.  [facility] interpreter present for visit.       Last edited by [name], MD on 03/02/2021  8:30 PM. (History)    pt states vision is stable, no blurry or missing spots of vision  Referring physician: No referring provider defined for this encounter.  HISTORICAL INFORMATION:   Selected notes from the MEDICAL RECORD NUMBER Referred by [name] for concern of sub-macular fluid / retinoschisis   CURRENT MEDICATIONS: No current outpatient medications on file. (Ophthalmic Drugs)   No current facility-administered medications for this visit. (Ophthalmic Drugs)   Current Outpatient Medications (Other)  Medication Sig  . eplerenone (INSPRA) 50 MG tablet Take 1 tablet (50 mg total) by mouth daily.  . hydrochlorothiazide (HYDRODIURIL) 25 MG tablet TAKE 1 TABLET BY MOUTH ONCE DAILY IN THE MORNING FOR 90 DAYS  . lisinopril (PRINIVIL,ZESTRIL) 20 MG tablet Take 1 tablet (20 mg total) by mouth daily.   No current facility-administered medications for this visit. (Other)      REVIEW OF SYSTEMS: ROS    Positive for: Eyes, Psychiatric   Negative for: Constitutional, Gastrointestinal, Neurological, Skin, Genitourinary, Musculoskeletal, HENT, Endocrine, Cardiovascular, Respiratory, Allergic/Imm, Heme/Lymph   Last edited by [name] on 03/02/2021  2:40 PM. (History)       ALLERGIES No Known Allergies  PAST MEDICAL HISTORY Past Medical History:  Diagnosis Date  . Cataract    Mixed form OU  . CIN I (cervical intraepithelial neoplasia I)   . Depression   . High risk HPV infection   . Hypertension   . Hypertensive retinopathy    OU  . Missed ab AGUST 6 2014  . Retina disorder 10/22/2019   pt states taking inspra for excess fluid in retina  . Sciatic nerve injury    Past Surgical History:  Procedure Laterality Date  . MYOMECTOMY  2008    FAMILY HISTORY Family History  Problem Relation Age of Onset  . Hypertension Mother   . Diabetes Father   . Diabetes Maternal Grandmother   . Hypertension Maternal Grandmother   . Colon cancer Neg Hx   . Colon polyps Neg Hx   . Esophageal cancer Neg Hx   . Rectal cancer Neg Hx   . Stomach cancer Neg Hx     SOCIAL HISTORY Social History   Tobacco Use  . Smoking status: Never Smoker  . Smokeless tobacco: Never Used  Vaping Use  . Vaping Use: Never used  Substance Use Topics  . Alcohol use: No    Alcohol/week: 0.0 standard drinks  . Drug use: No         OPHTHALMIC EXAM:  Base Eye Exam    Visual Acuity (  Snellen - Linear)      Right Left   Dist cc 20/70 +1 20/25 +1   Dist ph cc 20/60 -2 20/20 -2   Correction: Glasses       Tonometry (Tonopen, 2:50 PM)      Right Left   Pressure 19 16       Pupils      Dark Light Shape React APD   Right 3 2 Round Brisk 0   Left 3 2 Round Brisk 0       Visual Fields      Left Right    Full Full       Extraocular Movement      Right Left    Full Full       Neuro/Psych    Oriented x3: Yes   Mood/Affect: Normal       Dilation    Both eyes: 1.0% Mydriacyl, 2.5% Phenylephrine @ 2:50 PM        Slit Lamp and Fundus Exam    Slit Lamp Exam      Right Left   Lids/Lashes Dermatochalasis - upper lid Dermatochalasis - upper lid   Conjunctiva/Sclera Injection, nasal pterygium 1+ Injection,  nasal Pinguecula   Cornea Nasal ptergyium extending 2.63mm onto nasal cornea 1+Punctate epithelial erosions   Anterior Chamber Deep, clear, narrow temporal angle, No cell or flare deep, narrow temporal angle, 1+pigment   Iris Round and dilated Round and dilated   Lens 2+ Nuclear sclerosis, 2+ Cortical cataract 2+ Nuclear sclerosis, 2+ Cortical cataract   Vitreous Mild Vitreous syneresis, Posterior vitreous detachment, vitreous condensations Mild Vitreous syneresis       Fundus Exam      Right Left   Disc Pink and Sharp, +cupping Pink and Sharp, +cupping   C/D Ratio 0.65 0.6   Macula Flat, Blunted foveal reflex, interval increase in SRF nasal macula, RPE mottling, clumping and atrophy, No heme  Flat, good foveal reflex, Epiretinal membrane, mild RPE mottling and clumping superiorly, No heme or edema   Vessels Vascular attenuation, mild Tortuousity Vascular attenuation, mild Tortuousity   Periphery Attached, bullous Retinoschisis cavity from 0700-0830 -- no associated RT/RD - stable from prior, shallow schisis cavity ST, mild peripheral cystoid degeneration Attached, temporal retinoschisis cavity peripheral -- stable from prior        Refraction    Wearing Rx      Sphere Cylinder Axis   Right +1.25 +1.25 112   Left +2.25 +1.00 101          IMAGING AND PROCEDURES  Imaging and Procedures for @TODAY @  OCT, Retina - OU - Both Eyes       Right Eye Quality was good. Central Foveal Thickness: 230. Progression has improved. Findings include normal foveal contour, no IRF, outer retinal atrophy, vitreomacular adhesion  (Interval increase in SRF nasal fovea).   Left Eye Quality was good. Central Foveal Thickness: 294. Progression has been stable. Findings include normal foveal contour, no IRF, vitreomacular adhesion  (Focal SRF nasal to disc caught on widefield).   Notes *Images captured and stored on drive  Diagnosis / Impression:  OD: Interval increase in SRF nasal macula OS: NFP,  no IRF/SRF centrally, +VMA; Focal SRF nasal to disc caught on widefield  Clinical management:  See below  Abbreviations: NFP - Normal foveal profile. CME - cystoid macular edema. PED - pigment epithelial detachment. IRF - intraretinal fluid. SRF - subretinal fluid. EZ - ellipsoid zone. ERM - epiretinal membrane. ORA - outer retinal  atrophy. ORT - outer retinal tubulation. SRHM - subretinal hyper-reflective material        Intravitreal Injection, Pharmacologic Agent - OD - Right Eye       Time Out 03/02/2021. 3:11 PM. Confirmed correct patient, procedure, site, and patient consented.   Anesthesia Topical anesthesia was used. Anesthetic medications included Lidocaine 2%, Proparacaine 0.5%.   Procedure Preparation included 5% betadine to ocular surface, eyelid speculum. A (32g) needle was used.   Injection:  2 mg aflibercept Alfonse Flavors) SOLN   NDC: A3590391, Lot: [id number], Expiration date: 10/10/2021   Route: Intravitreal, Site: Right Eye, Waste: 0.05 mL  Post-op Post injection exam found visual acuity of at least counting fingers. The patient tolerated the procedure well. There were no complications. The patient received written and verbal post procedure care education. Post injection medications were not given.                 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 Exudative age-related macular degeneration of right eye with active choroidal neovascularization (HCC)  H35.3211 Intravitreal Injection, Pharmacologic Agent - OD - Right Eye    aflibercept (EYLEA) SOLN 2 mg  3. 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 Essential hypertension  I10   6. Hypertensive retinopathy of both eyes  H35.033   7. 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 CSCR OD -- likely chronic  - pt reports decreased vision OD since December 2019  - reports  significant stressors in life  - denies steroid use  - FA (09.30.20) shows focal hyperfluorescent leakage points nasal macula OD -- expansile dot phenotype?  - started 50 mg po eplerenone since 09.30.20  -- pt self d/c in February, re-started in June 2021  - s/p IVA #1 (09.01.21), #2 (09.29.21), #3 (11.03.21), #4 (12.03.21) -- IVA resistance  - s/p IVE OD #1 (01.20.22), #2 (02.18.22), #3 (04.05.22-sample)  - BCVA 20/60 OD (improved)  - OCT shows interval increase in SRF nasal macula at 6 wks  - differential includes atypical exudative ARMD  - recommend IVE OD #4 today, 05.23.22  - RBA of procedure discussed, questions answered  - IVA informed consent obtained and signed 09.01.21  - IVE informed consent obtained and signed, 01.20.22  - see procedure note  - recommend cont po eplerenone 50 mg daily  - Eylea4U benefits investigation initiated 12.3.21 -- approved for 2022 -- Eylea4U benefits investigation re-started, 04.05.22, per Lenna Sciara -- will back date application to December  - f/u 4-5 weeks -- DFE/OCT, possible injection  4. Retinoschisis OU (OD > OS) -- stable  - bullous retinoschisis cavity IT quadrant OD-- 2536-6440 oclock  - shallow retinoschisis cavity IT quadrant OS  - no associated retinal holes/RT/RD on scleral depression  - discussed findings and prognosis   - recommend observation -- consider laser retinopexy if schisis progresses or threatens macula or develops RD  5,6. Hypertensive retinopathy OU  - discussed importance of tight BP control  - monitor  7. 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  - discussed diagnosis and progression  - not yet visually significant  - monitor for now   Ophthalmic Meds Ordered this visit:  Meds ordered this encounter  Medications  . aflibercept (EYLEA) SOLN 2  mg       Return for f/u 4-5 weeks, CSR, DFE, OCT.  There are no Patient  Instructions on file for this visit.  This document serves as a record of services personally performed by Gardiner Sleeper, MD, PhD. It was created on their behalf by Leeann Must, [place], an ophthalmic technician. The creation of this record is the provider's dictation and/or activities during the visit.    Electronically signed by: Leeann Must, COA @TODAY @ 8:33 PM   This document serves as a record of services personally performed by Gardiner Sleeper, MD, PhD. It was created on their behalf by San Jetty. Owens Shark, OA an ophthalmic technician. The creation of this record is the provider's dictation and/or activities during the visit.    Electronically signed by: San Jetty. Marguerita Merles 05.23.2022 8:33 PM  Gardiner Sleeper, M.D., Ph.D. Diseases & Surgery of the Retina and [place] 03/02/2021   I have reviewed the above documentation for accuracy and completeness, and I agree with the above. Gardiner Sleeper, M.D., Ph.D. 03/02/21 8:37 PM  Abbreviations: M myopia (nearsighted); A astigmatism; H hyperopia (farsighted); P presbyopia; Mrx spectacle prescription;  CTL contact lenses; OD right eye; OS left eye; OU both eyes  XT exotropia; ET esotropia; PEK punctate epithelial keratitis; PEE punctate epithelial erosions; DES dry eye syndrome; MGD meibomian gland dysfunction; ATs artificial tears; PFAT's preservative free artificial tears; [place] nuclear sclerotic cataract; PSC posterior subcapsular cataract; ERM epi-retinal membrane; PVD posterior vitreous detachment; RD retinal detachment; DM diabetes mellitus; DR diabetic retinopathy; NPDR non-proliferative diabetic retinopathy; PDR proliferative diabetic retinopathy; CSME clinically significant macular edema; DME diabetic macular edema; dbh dot blot hemorrhages; CWS cotton wool spot; POAG primary open angle glaucoma; C/D cup-to-disc ratio; HVF humphrey visual field; GVF goldmann visual field; OCT optical coherence tomography; IOP  intraocular pressure; BRVO Branch retinal vein occlusion; CRVO central retinal vein occlusion; CRAO central retinal artery occlusion; BRAO branch retinal artery occlusion; RT retinal tear; SB scleral buckle; PPV pars plana vitrectomy; VH Vitreous hemorrhage; PRP panretinal laser photocoagulation; IVK intravitreal kenalog; VMT vitreomacular traction; MH Macular hole;  NVD neovascularization of the disc; NVE neovascularization elsewhere; AREDS age related eye disease study; ARMD age related macular degeneration; POAG primary open angle glaucoma; EBMD epithelial/anterior basement membrane dystrophy; ACIOL anterior chamber intraocular lens; IOL intraocular lens; PCIOL posterior chamber intraocular lens; Phaco/IOL phacoemulsification with intraocular lens placement; [place] photorefractive keratectomy; LASIK laser assisted in situ keratomileusis; HTN hypertension; DM diabetes mellitus; COPD chronic obstructive pulmonary disease

## 2021-03-02 ENCOUNTER — Encounter (INDEPENDENT_AMBULATORY_CARE_PROVIDER_SITE_OTHER): Payer: Self-pay | Admitting: Ophthalmology

## 2021-03-02 ENCOUNTER — Ambulatory Visit (INDEPENDENT_AMBULATORY_CARE_PROVIDER_SITE_OTHER): Payer: Commercial Managed Care - PPO | Admitting: Ophthalmology

## 2021-03-02 ENCOUNTER — Other Ambulatory Visit: Payer: Self-pay

## 2021-03-02 DIAGNOSIS — H25813 Combined forms of age-related cataract, bilateral: Secondary | ICD-10-CM

## 2021-03-02 DIAGNOSIS — H353211 Exudative age-related macular degeneration, right eye, with active choroidal neovascularization: Secondary | ICD-10-CM | POA: Diagnosis not present

## 2021-03-02 DIAGNOSIS — H33103 Unspecified retinoschisis, bilateral: Secondary | ICD-10-CM

## 2021-03-02 DIAGNOSIS — I10 Essential (primary) hypertension: Secondary | ICD-10-CM

## 2021-03-02 DIAGNOSIS — H35033 Hypertensive retinopathy, bilateral: Secondary | ICD-10-CM

## 2021-03-02 DIAGNOSIS — H35711 Central serous chorioretinopathy, right eye: Secondary | ICD-10-CM | POA: Diagnosis not present

## 2021-03-02 DIAGNOSIS — H3581 Retinal edema: Secondary | ICD-10-CM

## 2021-03-02 MED ORDER — AFLIBERCEPT 2MG/0.05ML IZ SOLN FOR KALEIDOSCOPE
2.0000 mg | INTRAVITREAL | Status: AC | PRN
  Administered 2021-03-02: 2 mg via INTRAVITREAL

## 2021-03-18 ENCOUNTER — Other Ambulatory Visit (INDEPENDENT_AMBULATORY_CARE_PROVIDER_SITE_OTHER): Payer: Self-pay | Admitting: Ophthalmology

## 2021-03-19 ENCOUNTER — Other Ambulatory Visit (INDEPENDENT_AMBULATORY_CARE_PROVIDER_SITE_OTHER): Payer: Self-pay

## 2021-03-19 MED ORDER — EPLERENONE 50 MG PO TABS: 50.0000 mg | ORAL_TABLET | Freq: Every day | ORAL | 3 refills | Status: DC

## 2021-04-03 NOTE — Progress Notes (Signed)
[facility] Note  04/06/2021     CHIEF COMPLAINT Patient presents for Retina Follow Up   HISTORY OF PRESENT ILLNESS: [name] is a 50 y.o. female who presents to the clinic today for:  HPI     Retina Follow Up   Patient presents with  Other.  In right eye.  Duration of 5 weeks.  Since onset it is stable.  I, the attending physician,  performed the HPI with the patient and updated documentation appropriately.        Comments   5 week follow up [place] appears stable OU.       Last edited by [name], MD on 04/07/2021  9:10 AM.    pt states vision is good, she is still taking eplerenone. Reports stressful times at work  Referring physician: No referring provider defined for this encounter.  HISTORICAL INFORMATION:   Selected notes from the MEDICAL RECORD NUMBER Referred by [name] for concern of sub-macular fluid / retinoschisis   CURRENT MEDICATIONS: No current outpatient medications on file. (Ophthalmic Drugs)   No current facility-administered medications for this visit. (Ophthalmic Drugs)   Current Outpatient Medications (Other)  Medication Sig   eplerenone (INSPRA) 50 MG tablet Take 1 tablet (50 mg total) by mouth daily.   eplerenone (INSPRA) 50 MG tablet Take 1 tablet (50 mg total) by mouth daily.   hydrochlorothiazide (HYDRODIURIL) 25 MG tablet TAKE 1 TABLET BY MOUTH ONCE DAILY IN THE MORNING FOR 90 DAYS   lisinopril (PRINIVIL,ZESTRIL) 20 MG tablet Take 1 tablet (20 mg total) by mouth daily.   No current facility-administered medications for this visit. (Other)      REVIEW OF SYSTEMS: ROS   Positive for: Eyes, Psychiatric Negative for: Constitutional, Gastrointestinal, Neurological, Skin, Genitourinary, Musculoskeletal, HENT, Endocrine, Cardiovascular, Respiratory, Allergic/Imm, Heme/Lymph Last edited by Leonie Douglas, COA on 04/06/2021  2:48 PM.        ALLERGIES No Known  Allergies  PAST MEDICAL HISTORY Past Medical History:  Diagnosis Date   Cataract    Mixed form OU   CIN I (cervical intraepithelial neoplasia I)    Depression    High risk HPV infection    Hypertension    Hypertensive retinopathy    OU   Missed ab AGUST 6 2014   Retina disorder 10/22/2019   pt states taking inspra for excess fluid in retina   Sciatic nerve injury    Past Surgical History:  Procedure Laterality Date   MYOMECTOMY  2008    FAMILY HISTORY Family History  Problem Relation Age of Onset   Hypertension Mother    Diabetes Father    Diabetes Maternal Grandmother    Hypertension Maternal Grandmother    Colon cancer Neg Hx    Colon polyps Neg Hx    Esophageal cancer Neg Hx    Rectal cancer Neg Hx    Stomach cancer Neg Hx     SOCIAL HISTORY Social History   Tobacco Use   Smoking status: Never   Smokeless tobacco: Never  Vaping Use   Vaping Use: Never used  Substance Use Topics   Alcohol use: No    Alcohol/week: 0.0 standard drinks   Drug use: No         OPHTHALMIC EXAM:  Base Eye Exam     Visual Acuity (Snellen - Linear)       Right Left   Dist cc 20/80 +2 20/20   Dist ph  cc 20/70 -2     Correction: Glasses         Tonometry (Tonopen, 2:54 PM)       Right Left   Pressure 13 13         Pupils       Dark Light Shape React APD   Right 4 3 Round Brisk None   Left 4 3 Round Brisk None         Visual Fields (Counting fingers)       Left Right    Full Full         Extraocular Movement       Right Left    Full Full         Neuro/Psych     Oriented x3: Yes   Mood/Affect: Normal         Dilation     Both eyes: 1.0% Mydriacyl, 2.5% Phenylephrine @ 2:54 PM           Slit Lamp and Fundus Exam     Slit Lamp Exam       Right Left   Lids/Lashes Dermatochalasis - upper lid Dermatochalasis - upper lid   Conjunctiva/Sclera Injection, nasal pterygium 1+ Injection, nasal Pinguecula   Cornea Nasal ptergyium  extending 2.66mm onto nasal cornea 1+Punctate epithelial erosions   Anterior Chamber Deep, clear, narrow temporal angle, No cell or flare deep, narrow temporal angle, 1+pigment   Iris Round and dilated Round and dilated   Lens 2+ Nuclear sclerosis, 2+ Cortical cataract 2+ Nuclear sclerosis, 2+ Cortical cataract   Vitreous Mild Vitreous syneresis, Posterior vitreous detachment, vitreous condensations Mild Vitreous syneresis         Fundus Exam       Right Left   Disc Pink and Sharp, +cupping Pink and Sharp, +cupping   C/D Ratio 0.65 0.6   Macula Flat, Blunted foveal reflex, interval increase in SRF nasal macula, RPE mottling, clumping and atrophy, No heme  Flat, good foveal reflex, Epiretinal membrane, mild RPE mottling and clumping superiorly, No heme or edema   Vessels Vascular attenuation, mild Tortuousity Vascular attenuation, mild Tortuousity   Periphery Attached, bullous Retinoschisis cavity from 0700-0830 -- no associated RT/RD - stable from prior, shallow schisis cavity ST, mild peripheral cystoid degeneration Attached, temporal retinoschisis cavity peripheral -- stable from prior           Refraction     Wearing Rx       Sphere Cylinder Axis   Right +1.25 +1.25 112   Left +2.25 +1.00 101            IMAGING AND PROCEDURES  Imaging and Procedures for @TODAY @  OCT, Retina - OU - Both Eyes       Right Eye Quality was good. Central Foveal Thickness: 251. Progression has worsened. Findings include normal foveal contour, no IRF, outer retinal atrophy, vitreomacular adhesion (Interval increase in SRF nasal fovea).   Left Eye Quality was good. Central Foveal Thickness: 292. Progression has been stable. Findings include normal foveal contour, no IRF, vitreomacular adhesion (Focal SRF nasal to disc caught on widefield).   Notes *Images captured and stored on drive  Diagnosis / Impression:  OD: Interval increase in SRF nasal macula OS: NFP, no IRF/SRF centrally, +VMA;  Focal SRF nasal to disc caught on widefield  Clinical management:  See below  Abbreviations: NFP - Normal foveal profile. CME - cystoid macular edema. PED - pigment epithelial detachment. IRF - intraretinal fluid. SRF - subretinal fluid. EZ -  ellipsoid zone. ERM - epiretinal membrane. ORA - outer retinal atrophy. ORT - outer retinal tubulation. SRHM - subretinal hyper-reflective material      Intravitreal Injection, Pharmacologic Agent - OD - Right Eye       Time Out 04/06/2021. 4:12 PM. Confirmed correct patient, procedure, site, and patient consented.   Anesthesia Topical anesthesia was used. Anesthetic medications included Lidocaine 2%, Proparacaine 0.5%.   Procedure Preparation included 5% betadine to ocular surface, eyelid speculum. A (32g) needle was used.   Injection: 2 mg aflibercept 2 MG/0.05ML   Route: Intravitreal, Site: Right Eye   NDC: [id number], Lot: [id number], Expiration date: 02/07/2022, Waste: 0.05 mL   Post-op Post injection exam found visual acuity of at least counting fingers. The patient tolerated the procedure well. There were no complications. The patient received written and verbal post procedure care education. Post injection medications were not given.               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 Exudative age-related macular degeneration of right eye with active choroidal neovascularization (HCC)  H35.3211 Intravitreal Injection, Pharmacologic Agent - OD - Right Eye    aflibercept (EYLEA) SOLN 2 mg    3. Retinal edema  H35.81 OCT, Retina - OU - Both Eyes    4. 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 Essential hypertension  I10     6. Hypertensive retinopathy of both eyes  H35.033     7. Combined forms of age-related cataract of both eyes  H25.813        1-3. CSCR OD -- likely chronic  - pt reports decreased vision OD since December 2019  - reports significant  stressors in life -- work  - denies steroid use  - FA (09.30.20) shows focal hyperfluorescent leakage points nasal macula OD -- expansile dot phenotype?  - started 50 mg po eplerenone since 09.30.20  -- pt self d/c in February, re-started in June 2021  - s/p IVA #1 (09.01.21), #2 (09.29.21), #3 (11.03.21), #4 (12.03.21) -- IVA resistance  - s/p IVE OD #1 (01.20.22), #2 (02.18.22), #3 (04.05.22-sample), #4 (05.23.22)  - BCVA 20/70 from 20/60 OD  - OCT shows interval increase in SRF nasal macula at 5 wks  - differential includes atypical exudative ARMD  - recommend IVE OD #5 today, 06.27.22 with interval back to 4 weeks  - RBA of procedure discussed, questions answered  - IVA informed consent obtained and signed 09.01.21  - IVE informed consent obtained and signed, 01.20.22  - see procedure note  - recommend cont po eplerenone 50 mg daily  - Eylea4U benefits investigation initiated 12.3.21 -- approved for 2022 -- Eylea4U benefits investigation re-started, 04.05.22, per Lenna Sciara -- will back date application to December  - f/u 4 weeks -- DFE/OCT, possible injection  4. Retinoschisis OU (OD > OS) -- stable  - bullous retinoschisis cavity IT quadrant OD-- 6803-2122 oclock  - shallow retinoschisis cavity IT quadrant OS  - no associated retinal holes/RT/RD on scleral depression  - discussed findings and prognosis   - recommend observation -- consider laser retinopexy if schisis progresses or threatens macula or develops RD  5,6. Hypertensive retinopathy OU  - discussed importance of tight BP control  - monitor  7. 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  - discussed diagnosis and  progression  - not yet visually significant  - monitor now   Ophthalmic Meds Ordered this visit:  Meds ordered this encounter  Medications   aflibercept (EYLEA) SOLN 2 mg        Return in about 4 weeks  (around 05/04/2021) for f/u CSR OD, DFE, OCT.  There are no Patient Instructions on file for this visit.  This document serves as a record of services personally performed by Gardiner Sleeper, MD, PhD. It was created on their behalf by Roselee Nova, COMT. The creation of this record is the provider's dictation and/or activities during the visit.  Electronically signed by: Roselee Nova, COMT 04/07/21 9:21 AM  This document serves as a record of services personally performed by Gardiner Sleeper, MD, PhD. It was created on their behalf by San Jetty. Owens Shark, OA an ophthalmic technician. The creation of this record is the provider's dictation and/or activities during the visit.    Electronically signed by: San Jetty. Owens Shark, [place] 06.27.2022 9:21 AM   Gardiner Sleeper, M.D., Ph.D. Diseases & Surgery of the Retina and Vitreous [facility]  I have reviewed the above documentation for accuracy and completeness, and I agree with the above. Gardiner Sleeper, M.D., Ph.D. 04/07/21 9:21 AM   Abbreviations: M myopia (nearsighted); A astigmatism; H hyperopia (farsighted); P presbyopia; Mrx spectacle prescription;  CTL contact lenses; OD right eye; OS left eye; OU both eyes  XT exotropia; ET esotropia; PEK punctate epithelial keratitis; PEE punctate epithelial erosions; DES dry eye syndrome; MGD meibomian gland dysfunction; ATs artificial tears; PFAT's preservative free artificial tears; [employer] nuclear sclerotic cataract; PSC posterior subcapsular cataract; ERM epi-retinal membrane; PVD posterior vitreous detachment; RD retinal detachment; DM diabetes mellitus; DR diabetic retinopathy; NPDR non-proliferative diabetic retinopathy; PDR proliferative diabetic retinopathy; CSME clinically significant macular edema; DME diabetic macular edema; dbh dot blot hemorrhages; CWS cotton wool spot; POAG primary open angle glaucoma; C/D cup-to-disc ratio; HVF humphrey visual field; GVF goldmann visual field; OCT  optical coherence tomography; IOP intraocular pressure; BRVO Branch retinal vein occlusion; CRVO central retinal vein occlusion; CRAO central retinal artery occlusion; BRAO branch retinal artery occlusion; RT retinal tear; SB scleral buckle; PPV pars plana vitrectomy; VH Vitreous hemorrhage; PRP panretinal laser photocoagulation; IVK intravitreal kenalog; VMT vitreomacular traction; MH Macular hole;  NVD neovascularization of the disc; NVE neovascularization elsewhere; AREDS age related eye disease study; ARMD age related macular degeneration; POAG primary open angle glaucoma; EBMD epithelial/anterior basement membrane dystrophy; ACIOL anterior chamber intraocular lens; IOL intraocular lens; PCIOL posterior chamber intraocular lens; Phaco/IOL phacoemulsification with intraocular lens placement; [place] photorefractive keratectomy; LASIK laser assisted in situ keratomileusis; HTN hypertension; DM diabetes mellitus; COPD chronic obstructive pulmonary disease

## 2021-04-06 ENCOUNTER — Ambulatory Visit (INDEPENDENT_AMBULATORY_CARE_PROVIDER_SITE_OTHER): Payer: Commercial Managed Care - PPO | Admitting: Ophthalmology

## 2021-04-06 ENCOUNTER — Other Ambulatory Visit: Payer: Self-pay

## 2021-04-06 DIAGNOSIS — H33103 Unspecified retinoschisis, bilateral: Secondary | ICD-10-CM

## 2021-04-06 DIAGNOSIS — H353211 Exudative age-related macular degeneration, right eye, with active choroidal neovascularization: Secondary | ICD-10-CM | POA: Diagnosis not present

## 2021-04-06 DIAGNOSIS — I10 Essential (primary) hypertension: Secondary | ICD-10-CM

## 2021-04-06 DIAGNOSIS — H25813 Combined forms of age-related cataract, bilateral: Secondary | ICD-10-CM

## 2021-04-06 DIAGNOSIS — H3581 Retinal edema: Secondary | ICD-10-CM

## 2021-04-06 DIAGNOSIS — H35711 Central serous chorioretinopathy, right eye: Secondary | ICD-10-CM | POA: Diagnosis not present

## 2021-04-06 DIAGNOSIS — H35033 Hypertensive retinopathy, bilateral: Secondary | ICD-10-CM

## 2021-04-07 ENCOUNTER — Encounter (INDEPENDENT_AMBULATORY_CARE_PROVIDER_SITE_OTHER): Payer: Self-pay | Admitting: Ophthalmology

## 2021-04-07 MED ORDER — AFLIBERCEPT 2MG/0.05ML IZ SOLN FOR KALEIDOSCOPE
2.0000 mg | INTRAVITREAL | Status: AC | PRN
  Administered 2021-04-06: 2 mg via INTRAVITREAL

## 2021-05-04 ENCOUNTER — Encounter (INDEPENDENT_AMBULATORY_CARE_PROVIDER_SITE_OTHER): Payer: Commercial Managed Care - PPO | Admitting: Ophthalmology

## 2021-05-04 DIAGNOSIS — H35033 Hypertensive retinopathy, bilateral: Secondary | ICD-10-CM

## 2021-05-04 DIAGNOSIS — I10 Essential (primary) hypertension: Secondary | ICD-10-CM

## 2021-05-04 DIAGNOSIS — H35711 Central serous chorioretinopathy, right eye: Secondary | ICD-10-CM

## 2021-05-04 DIAGNOSIS — H353211 Exudative age-related macular degeneration, right eye, with active choroidal neovascularization: Secondary | ICD-10-CM

## 2021-05-04 DIAGNOSIS — H33103 Unspecified retinoschisis, bilateral: Secondary | ICD-10-CM

## 2021-05-04 DIAGNOSIS — H3581 Retinal edema: Secondary | ICD-10-CM

## 2021-05-04 DIAGNOSIS — H25813 Combined forms of age-related cataract, bilateral: Secondary | ICD-10-CM

## 2021-05-05 NOTE — Progress Notes (Signed)
[facility] Note  05/07/2021     CHIEF COMPLAINT Patient presents for Retina Follow Up   HISTORY OF PRESENT ILLNESS: [name] is a 50 y.o. female who presents to the clinic today for:  HPI     Retina Follow Up   Patient presents with  Other.  In right eye.  Duration of 4.5 weeks.  Since onset it is stable.  I, the attending physician,  performed the HPI with the patient and updated documentation appropriately.        Comments   Pt here for 4.5 wk ret f/u for CSCR OD. Pt states vision is the same, no worsening or improvement. No ocular pain or discomfort noted.       Last edited by [name], MD on 05/08/2021  1:12 AM.      Referring physician: No referring provider defined for this encounter.  HISTORICAL INFORMATION:   Selected notes from the MEDICAL RECORD NUMBER Referred by [name] for concern of sub-macular fluid / retinoschisis   CURRENT MEDICATIONS: No current outpatient medications on file. (Ophthalmic Drugs)   No current facility-administered medications for this visit. (Ophthalmic Drugs)   Current Outpatient Medications (Other)  Medication Sig   eplerenone (INSPRA) 50 MG tablet Take 1 tablet by mouth once daily   eplerenone (INSPRA) 50 MG tablet Take 1 tablet (50 mg total) by mouth daily.   hydrochlorothiazide (HYDRODIURIL) 25 MG tablet TAKE 1 TABLET BY MOUTH ONCE DAILY IN THE MORNING FOR 90 DAYS   lisinopril (PRINIVIL,ZESTRIL) 20 MG tablet Take 1 tablet (20 mg total) by mouth daily.   No current facility-administered medications for this visit. (Other)      REVIEW OF SYSTEMS: ROS   Positive for: Eyes, Psychiatric Negative for: Constitutional, Gastrointestinal, Neurological, Skin, Genitourinary, Musculoskeletal, HENT, Endocrine, Cardiovascular, Respiratory, Allergic/Imm, Heme/Lymph Last edited by Kingsley Spittle, COT on 05/07/2021  9:29 AM.         ALLERGIES No Known  Allergies  PAST MEDICAL HISTORY Past Medical History:  Diagnosis Date   Cataract    Mixed form OU   CIN I (cervical intraepithelial neoplasia I)    Depression    High risk HPV infection    Hypertension    Hypertensive retinopathy    OU   Missed ab AGUST 6 2014   Retina disorder 10/22/2019   pt states taking inspra for excess fluid in retina   Sciatic nerve injury    Past Surgical History:  Procedure Laterality Date   MYOMECTOMY  2008    FAMILY HISTORY Family History  Problem Relation Age of Onset   Hypertension Mother    Diabetes Father    Diabetes Maternal Grandmother    Hypertension Maternal Grandmother    Colon cancer Neg Hx    Colon polyps Neg Hx    Esophageal cancer Neg Hx    Rectal cancer Neg Hx    Stomach cancer Neg Hx     SOCIAL HISTORY Social History   Tobacco Use   Smoking status: Never   Smokeless tobacco: Never  Vaping Use   Vaping Use: Never used  Substance Use Topics   Alcohol use: No    Alcohol/week: 0.0 standard drinks   Drug use: No         OPHTHALMIC EXAM:  Base Eye Exam     Visual Acuity (Snellen - Linear)       Right Left   Dist cc 20/100 +1 20/25 -1  Dist ph cc 20/70 -2     Correction: Glasses         Tonometry (Tonopen, 9:35 AM)       Right Left   Pressure 15 16         Pupils       Dark Light Shape React APD   Right 4 3 Round Brisk None   Left 4 3 Round Brisk None         Visual Fields (Counting fingers)       Left Right    Full Full         Extraocular Movement       Right Left    Full, Ortho Full, Ortho         Neuro/Psych     Oriented x3: Yes   Mood/Affect: Normal         Dilation     Both eyes: 1.0% Mydriacyl, 2.5% Phenylephrine @ 9:35 AM           Slit Lamp and Fundus Exam     Slit Lamp Exam       Right Left   Lids/Lashes Dermatochalasis - upper lid Dermatochalasis - upper lid   Conjunctiva/Sclera Injection, nasal pterygium 1+ Injection, nasal Pinguecula   Cornea  Nasal ptergyium extending 2.71m onto nasal cornea 1+Punctate epithelial erosions   Anterior Chamber Deep, clear, narrow temporal angle, No cell or flare deep, narrow temporal angle, 1+pigment   Iris Round and dilated Round and dilated   Lens 2+ Nuclear sclerosis, 2+ Cortical cataract 2+ Nuclear sclerosis, 2+ Cortical cataract   Vitreous Mild Vitreous syneresis, Posterior vitreous detachment, vitreous condensations Mild Vitreous syneresis         Fundus Exam       Right Left   Disc Pink and Sharp, +cupping Pink and Sharp, +cupping   C/D Ratio 0.65 0.6   Macula Flat, Blunted foveal reflex, persistent SRF nasal macula, RPE mottling, clumping and atrophy, No heme  Flat, good foveal reflex, Epiretinal membrane, mild RPE mottling and clumping superiorly, No heme or edema   Vessels Vascular attenuation, mild Tortuousity Vascular attenuation, mild Tortuousity   Periphery Attached, bullous Retinoschisis cavity from 0700-0830 -- no associated RT/RD - stable from prior, shallow schisis cavity ST, mild peripheral cystoid degeneration Attached, temporal retinoschisis cavity peripheral -- stable from prior           Refraction     Wearing Rx       Sphere Cylinder Axis   Right +1.25 +1.25 112   Left +2.25 +1.00 101            IMAGING AND PROCEDURES  Imaging and Procedures for _0 @  OCT, Retina - OU - Both Eyes       Right Eye Quality was good. Central Foveal Thickness: 284. Progression has worsened. Findings include no IRF, outer retinal atrophy, vitreomacular adhesion , abnormal foveal contour (Persistent SRF nasal fovea. Interval redevelopment of subfoveal SRF.).   Left Eye Quality was good. Central Foveal Thickness: 298. Progression has been stable. Findings include normal foveal contour, no IRF, vitreomacular adhesion .   Notes *Images captured and stored on drive  Diagnosis / Impression:  OD: Persistent SRF nasal fovea.  Interval redevelopment of subfoveal SRF. OS: NFP,  no IRF/SRF centrally, +VMA  Clinical management:  See below  Abbreviations: NFP - Normal foveal profile. CME - cystoid macular edema. PED - pigment epithelial detachment. IRF - intraretinal fluid. SRF - subretinal fluid. EZ - ellipsoid zone. ERM -  epiretinal membrane. ORA - outer retinal atrophy. ORT - outer retinal tubulation. SRHM - subretinal hyper-reflective material      Intravitreal Injection, Pharmacologic Agent - OD - Right Eye       Time Out 05/07/2021. 10:31 AM. Confirmed correct patient, procedure, site, and patient consented.   Anesthesia Topical anesthesia was used. Anesthetic medications included Lidocaine 2%, Proparacaine 0.5%.   Procedure Preparation included 5% betadine to ocular surface, eyelid speculum. A (32g) needle was used.   Injection: 2 mg aflibercept 2 MG/0.05ML   Route: Intravitreal, Site: Right Eye   NDC: [id number], Lot: [id number], Expiration date: 02/07/2022, Waste: 0.05 mL   Post-op Post injection exam found visual acuity of at least counting fingers. The patient tolerated the procedure well. There were no complications. The patient received written and verbal post procedure care education. Post injection medications were not given.            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 Exudative age-related macular degeneration of right eye with active choroidal neovascularization (HCC)  H35.3211 Intravitreal Injection, Pharmacologic Agent - OD - Right Eye    aflibercept (EYLEA) SOLN 2 mg    3. Retinal edema  H35.81 OCT, Retina - OU - Both Eyes    4. 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 Essential hypertension  I10     6. Hypertensive retinopathy of both eyes  H35.033     7. Combined forms of age-related cataract of both eyes  H25.813      1-3. 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  - started 50 mg po eplerenone since 09.30.20  -- pt self d/c in February, re-started in June 2021  - s/p IVA #1 (09.01.21), #2 (09.29.21), #3 (11.03.21), #4 (12.03.21) -- IVA resistance  - s/p IVE OD #1 (01.20.22), #2 (02.18.22), #3 (04.05.22-sample), #4 (05.23.22), # 5 (06.27.22)  - BCVA stable at 20/70  - OCT shows persistent SRF nasal fovea.  Interval redevelopment of subfoveal SRF  - differential includes atypical exudative ARMD  - recommend IVE OD #6 today, 07.28.22 with interval back to 4 weeks  - RBA of procedure discussed, questions answered  - IVA informed consent obtained and signed 09.01.21  - IVE informed consent obtained and signed, 01.20.22  - see procedure note  - recommend cont po eplerenone 50 mg daily  - Eylea[employer]U benefits investigation initiated 12.3.21 -- approved for 2022 -- Eylea[employer]U benefits investigation re-started, 04.05.22, per Lenna Sciara -- will back date application to December  - f/u 4 weeks -- DFE/OCT, possible injection  4. Retinoschisis OU (OD > OS) -- stable  - bullous retinoschisis cavity IT quadrant OD-- 3888-2800 oclock  - shallow retinoschisis cavity IT quadrant OS  - no associated retinal holes/RT/RD on scleral depression  - discussed findings and prognosis   - recommend observation -- consider laser retinopexy if schisis progresses or threatens macula or develops RD  5,6. Hypertensive retinopathy OU  - discussed importance of tight BP control  - monitor  7. 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  - discussed diagnosis and progression  - not yet visually significant  - monitor now  Ophthalmic Meds Ordered this visit:  Meds ordered this  encounter  Medications   aflibercept (EYLEA) SOLN 2 mg       Return in about 4 weeks (around 06/04/2021) for 4 wk f/u for CSCR OD w/DFE/OCT/likey inj..  There are no Patient Instructions on file for this visit.  This document serves as a record of services personally performed by Gardiner Sleeper, MD, PhD. It was created on their behalf by Leonie Douglas, an ophthalmic technician. The creation of this record is the provider's dictation and/or activities during the visit.    Electronically signed by: Leonie Douglas COA, 05/08/21  1:13 AM  This document serves as a record of services personally performed by Gardiner Sleeper, MD, PhD. It was created on their behalf by Estill Bakes, COT an ophthalmic technician. The creation of this record is the provider's dictation and/or activities during the visit.    Electronically signed by: Estill Bakes, COT 7.28.22 @ 1:13 AM   Gardiner Sleeper, M.D., Ph.D. Diseases & Surgery of the Retina and [place] 7.28.22  I have reviewed the above documentation for accuracy and completeness, and I agree with the above. Gardiner Sleeper, M.D., Ph.D. 05/08/21 1:14 AM   Abbreviations: M myopia (nearsighted); A astigmatism; H hyperopia (farsighted); P presbyopia; Mrx spectacle prescription;  CTL contact lenses; OD right eye; OS left eye; OU both eyes  XT exotropia; ET esotropia; PEK punctate epithelial keratitis; PEE punctate epithelial erosions; DES dry eye syndrome; MGD meibomian gland dysfunction; ATs artificial tears; PFAT's preservative free artificial tears; [place] nuclear sclerotic cataract; PSC posterior subcapsular cataract; ERM epi-retinal membrane; PVD posterior vitreous detachment; RD retinal detachment; DM diabetes mellitus; DR diabetic retinopathy; NPDR non-proliferative diabetic retinopathy; PDR proliferative diabetic retinopathy; CSME clinically significant macular edema; DME diabetic macular edema; dbh dot blot hemorrhages;  CWS cotton wool spot; POAG primary open angle glaucoma; C/D cup-to-disc ratio; HVF humphrey visual field; GVF goldmann visual field; OCT optical coherence tomography; IOP intraocular pressure; BRVO Branch retinal vein occlusion; CRVO central retinal vein occlusion; CRAO central retinal artery occlusion; BRAO branch retinal artery occlusion; RT retinal tear; SB scleral buckle; PPV pars plana vitrectomy; VH Vitreous hemorrhage; PRP panretinal laser photocoagulation; IVK intravitreal kenalog; VMT vitreomacular traction; MH Macular hole;  NVD neovascularization of the disc; NVE neovascularization elsewhere; AREDS age related eye disease study; ARMD age related macular degeneration; POAG primary open angle glaucoma; EBMD epithelial/anterior basement membrane dystrophy; ACIOL anterior chamber intraocular lens; IOL intraocular lens; PCIOL posterior chamber intraocular lens; Phaco/IOL phacoemulsification with intraocular lens placement; [place] photorefractive keratectomy; LASIK laser assisted in situ keratomileusis; HTN hypertension; DM diabetes mellitus; COPD chronic obstructive pulmonary disease

## 2021-05-07 ENCOUNTER — Encounter (INDEPENDENT_AMBULATORY_CARE_PROVIDER_SITE_OTHER): Payer: Self-pay | Admitting: Ophthalmology

## 2021-05-07 ENCOUNTER — Ambulatory Visit (INDEPENDENT_AMBULATORY_CARE_PROVIDER_SITE_OTHER): Payer: Commercial Managed Care - PPO | Admitting: Ophthalmology

## 2021-05-07 ENCOUNTER — Other Ambulatory Visit: Payer: Self-pay

## 2021-05-07 DIAGNOSIS — I10 Essential (primary) hypertension: Secondary | ICD-10-CM

## 2021-05-07 DIAGNOSIS — H35711 Central serous chorioretinopathy, right eye: Secondary | ICD-10-CM

## 2021-05-07 DIAGNOSIS — H3581 Retinal edema: Secondary | ICD-10-CM | POA: Diagnosis not present

## 2021-05-07 DIAGNOSIS — H353211 Exudative age-related macular degeneration, right eye, with active choroidal neovascularization: Secondary | ICD-10-CM | POA: Diagnosis not present

## 2021-05-07 DIAGNOSIS — H35033 Hypertensive retinopathy, bilateral: Secondary | ICD-10-CM

## 2021-05-07 DIAGNOSIS — H33103 Unspecified retinoschisis, bilateral: Secondary | ICD-10-CM

## 2021-05-07 DIAGNOSIS — H25813 Combined forms of age-related cataract, bilateral: Secondary | ICD-10-CM

## 2021-05-08 ENCOUNTER — Encounter (INDEPENDENT_AMBULATORY_CARE_PROVIDER_SITE_OTHER): Payer: Self-pay | Admitting: Ophthalmology

## 2021-05-08 MED ORDER — AFLIBERCEPT 2MG/0.05ML IZ SOLN FOR KALEIDOSCOPE
2.0000 mg | INTRAVITREAL | Status: AC | PRN
  Administered 2021-05-07: 2 mg via INTRAVITREAL

## 2021-05-15 DIAGNOSIS — H353290 Exudative age-related macular degeneration, unspecified eye, stage unspecified: Secondary | ICD-10-CM | POA: Diagnosis present

## 2021-05-15 DIAGNOSIS — H269 Unspecified cataract: Secondary | ICD-10-CM | POA: Diagnosis present

## 2021-06-04 NOTE — Progress Notes (Signed)
[facility] Note  06/05/2021     CHIEF COMPLAINT Patient presents for Retina Follow Up   HISTORY OF PRESENT ILLNESS: [name] is a 50 y.o. female who presents to the clinic today for:  HPI     Retina Follow Up   Patient presents with  Other.  In right eye.  Severity is mild.  Duration of 4 weeks.  Since onset it is stable.  I, the attending physician,  performed the HPI with the patient and updated documentation appropriately.        Comments   Pt here for 4 wk ret f/u CSCR OD. Pt states vision is the same, no changes. No ocular pain or discomfort.       Last edited by [name], MD on 06/09/2021 10:18 PM.    Pt states vision is improved, she feels like the stress from her job is better right now, she is still taking Eplerenone   Referring physician: No referring provider defined for this encounter.  HISTORICAL INFORMATION:   Selected notes from the MEDICAL RECORD NUMBER Referred by [name] for concern of sub-macular fluid / retinoschisis   CURRENT MEDICATIONS: No current outpatient medications on file. (Ophthalmic Drugs)   No current facility-administered medications for this visit. (Ophthalmic Drugs)   Current Outpatient Medications (Other)  Medication Sig   eplerenone (INSPRA) 50 MG tablet Take 1 tablet by mouth once daily   eplerenone (INSPRA) 50 MG tablet Take 1 tablet (50 mg total) by mouth daily.   hydrochlorothiazide (HYDRODIURIL) 25 MG tablet TAKE 1 TABLET BY MOUTH ONCE DAILY IN THE MORNING FOR 90 DAYS   lisinopril (PRINIVIL,ZESTRIL) 20 MG tablet Take 1 tablet (20 mg total) by mouth daily.   No current facility-administered medications for this visit. (Other)   REVIEW OF SYSTEMS: ROS   Positive for: Eyes, Psychiatric Negative for: Constitutional, Gastrointestinal, Neurological, Skin, Genitourinary, Musculoskeletal, HENT, Endocrine, Cardiovascular, Respiratory, Allergic/Imm, Heme/Lymph Last  edited by Kingsley Spittle, COT on 06/05/2021  1:14 PM.      ALLERGIES No Known Allergies  PAST MEDICAL HISTORY Past Medical History:  Diagnosis Date   Cataract    Mixed form OU   CIN I (cervical intraepithelial neoplasia I)    Depression    High risk HPV infection    Hypertension    Hypertensive retinopathy    OU   Missed ab AGUST 6 2014   Retina disorder 10/22/2019   pt states taking inspra for excess fluid in retina   Sciatic nerve injury    Past Surgical History:  Procedure Laterality Date   MYOMECTOMY  2008    FAMILY HISTORY Family History  Problem Relation Age of Onset   Hypertension Mother    Diabetes Father    Diabetes Maternal Grandmother    Hypertension Maternal Grandmother    Colon cancer Neg Hx    Colon polyps Neg Hx    Esophageal cancer Neg Hx    Rectal cancer Neg Hx    Stomach cancer Neg Hx     SOCIAL HISTORY Social History   Tobacco Use   Smoking status: Never   Smokeless tobacco: Never  Vaping Use   Vaping Use: Never used  Substance Use Topics   Alcohol use: No    Alcohol/week: 0.0 standard drinks   Drug use: No         OPHTHALMIC EXAM:  Base Eye Exam     Visual Acuity (Snellen - Linear)  Right Left   Dist cc 20/100 -2 20/20 -2   Dist ph cc 20/70 -2     Correction: Glasses         Tonometry (Tonopen, 1:22 PM)       Right Left   Pressure 17 14         Pupils       Dark Light Shape React APD   Right 4 3 Round Brisk None   Left 4 3 Round Brisk None         Visual Fields (Counting fingers)       Left Right    Full Full         Extraocular Movement       Right Left    Full, Ortho Full, Ortho         Neuro/Psych     Oriented x3: Yes   Mood/Affect: Normal         Dilation     Both eyes: 1.0% Mydriacyl, 2.5% Phenylephrine @ 1:23 PM           Slit Lamp and Fundus Exam     Slit Lamp Exam       Right Left   Lids/Lashes Dermatochalasis - upper lid Dermatochalasis - upper lid    Conjunctiva/Sclera Injection, nasal pterygium 1+ Injection, nasal Pinguecula   Cornea Nasal ptergyium extending 2.83m onto nasal cornea 1+Punctate epithelial erosions   Anterior Chamber Deep, clear, narrow temporal angle, No cell or flare deep, narrow temporal angle, 1+pigment   Iris Round and dilated Round and dilated   Lens 2+ Nuclear sclerosis, 2+ Cortical cataract 2+ Nuclear sclerosis, 2+ Cortical cataract   Vitreous Mild Vitreous syneresis, Posterior vitreous detachment, vitreous condensations Mild Vitreous syneresis         Fundus Exam       Right Left   Disc Pink and Sharp, +cupping Pink and Sharp, +cupping   C/D Ratio 0.65 0.6   Macula Flat, Blunted foveal reflex, persistent SRF nasal macula - slightly improved, RPE mottling, clumping and atrophy, No heme  Flat, good foveal reflex, Epiretinal membrane, mild RPE mottling and clumping superiorly, No heme or edema   Vessels Vascular attenuation, mild Tortuousity Vascular attenuation, mild Tortuousity   Periphery Attached, bullous Retinoschisis cavity from 0700-0830 -- no associated RT/RD - stable from prior, shallow schisis cavity ST, mild peripheral cystoid degeneration Attached, temporal retinoschisis cavity peripheral -- stable from prior           Refraction     Wearing Rx       Sphere Cylinder Axis   Right +1.25 +1.25 112   Left +2.25 +1.00 101            IMAGING AND PROCEDURES  Imaging and Procedures for _0 @  OCT, Retina - OU - Both Eyes       Right Eye Quality was good. Central Foveal Thickness: 226. Progression has improved. Findings include no IRF, outer retinal atrophy, vitreomacular adhesion , abnormal foveal contour (Interval improvement in SRF nasal fovea).   Left Eye Quality was good. Central Foveal Thickness: 300. Progression has been stable. Findings include normal foveal contour, no IRF, vitreomacular adhesion , no SRF.   Notes *Images captured and stored on drive  Diagnosis / Impression:   OD: Interval improvement in SRF nasal fovea OS: NFP, no IRF/SRF centrally, +VMA  Clinical management:  See below  Abbreviations: NFP - Normal foveal profile. CME - cystoid macular edema. PED - pigment epithelial detachment. IRF - intraretinal fluid.  SRF - subretinal fluid. EZ - ellipsoid zone. ERM - epiretinal membrane. ORA - outer retinal atrophy. ORT - outer retinal tubulation. SRHM - subretinal hyper-reflective material      Intravitreal Injection, Pharmacologic Agent - OD - Right Eye       Time Out 06/05/2021. 1:34 PM. Confirmed correct patient, procedure, site, and patient consented.   Anesthesia Topical anesthesia was used. Anesthetic medications included Lidocaine 2%, Proparacaine 0.5%.   Procedure Preparation included 5% betadine to ocular surface, eyelid speculum. A (32g) needle was used.   Injection: 2 mg aflibercept 2 MG/0.05ML   Route: Intravitreal, Site: Right Eye   NDC: [id number], Lot: [id number], Expiration date: 03/10/2022, Waste: 0.05 mL   Post-op Post injection exam found visual acuity of at least counting fingers. The patient tolerated the procedure well. There were no complications. The patient received written and verbal post procedure care education. Post injection medications were not given.             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 Exudative age-related macular degeneration of right eye with active choroidal neovascularization (HCC)  H35.3211 Intravitreal Injection, Pharmacologic Agent - OD - Right Eye    aflibercept (EYLEA) SOLN 2 mg    3. Retinal edema  H35.81 OCT, Retina - OU - Both Eyes    4. 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 Essential hypertension  I10     6. Hypertensive retinopathy of both eyes  H35.033     7. Combined forms of age-related cataract of both eyes  H25.813       1-3. CSCR OD -- likely chronic  - pt reports decreased vision OD since December  2019  - reports significant stressors in life -- work  - denies steroid use  - FA (09.30.20) shows focal hyperfluorescent leakage points nasal macula OD -- expansile dot phenotype?  - started 50 mg po eplerenone since 09.30.20  -- pt self d/c in February, re-started in June 2021  - s/p IVA #1 (09.01.21), #2 (09.29.21), #3 (11.03.21), #4 (12.03.21) -- IVA resistance  - s/p IVE OD #1 (01.20.22), #2 (02.18.22), #3 (04.05.22-sample), #4 (05.23.22), # 5 (06.27.22), #6 (07.28.22)  - BCVA stable at 20/70  - OCT shows interval improvement in SRF nasal fovea  - differential includes atypical exudative ARMD  - recommend IVE OD #7 today, 08.26.22 with interval back to 4 weeks  - RBA of procedure discussed, questions answered  - IVA informed consent obtained and signed 09.01.21  - IVE informed consent obtained and signed, 01.20.22  - see procedure note  - recommend cont po eplerenone 50 mg daily  - Eylea[employer]U benefits investigation initiated 12.3.21 -- approved for 2022 -- Eylea[employer]U benefits investigation re-started, 04.05.22, per Lenna Sciara -- will back date application to December  - f/u 4 weeks -- DFE/OCT, possible injection  4. Retinoschisis OU (OD > OS) -- stable  - bullous retinoschisis cavity IT quadrant OD-- 2505-3976 oclock  - shallow retinoschisis cavity IT quadrant OS  - no associated retinal holes/RT/RD on scleral depression  - discussed findings and prognosis   - recommend observation -- consider laser retinopexy if schisis progresses or threatens macula or develops RD  5,6. Hypertensive retinopathy OU  - discussed importance of tight BP control  - monitor   7. 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  -  discussed diagnosis and progression  - not yet visually significant  - monitor for now  Ophthalmic Meds Ordered this visit:  Meds ordered this encounter  Medications   aflibercept (EYLEA)  SOLN 2 mg        Return in about 4 weeks (around 07/03/2021) for f/u exu ARMD OD, DFE, OCT.  There are no Patient Instructions on file for this visit.  This document serves as a record of services personally performed by Gardiner Sleeper, MD, PhD. It was created on their behalf by Leonie Douglas, an ophthalmic technician. The creation of this record is the provider's dictation and/or activities during the visit.    Electronically signed by: Leonie Douglas COA, 06/09/21  10:23 PM  This document serves as a record of services personally performed by Gardiner Sleeper, MD, PhD. It was created on their behalf by San Jetty. Owens Shark, OA an ophthalmic technician. The creation of this record is the provider's dictation and/or activities during the visit.    Electronically signed by: San Jetty. Marguerita Merles 08.26.2022 10:23 PM   Gardiner Sleeper, M.D., Ph.D. Diseases & Surgery of the Retina and [place] 06/05/2021  I have reviewed the above documentation for accuracy and completeness, and I agree with the above. Gardiner Sleeper, M.D., Ph.D. 06/09/21 10:31 PM   Abbreviations: M myopia (nearsighted); A astigmatism; H hyperopia (farsighted); P presbyopia; Mrx spectacle prescription;  CTL contact lenses; OD right eye; OS left eye; OU both eyes  XT exotropia; ET esotropia; PEK punctate epithelial keratitis; PEE punctate epithelial erosions; DES dry eye syndrome; MGD meibomian gland dysfunction; ATs artificial tears; PFAT's preservative free artificial tears; [place] nuclear sclerotic cataract; PSC posterior subcapsular cataract; ERM epi-retinal membrane; PVD posterior vitreous detachment; RD retinal detachment; DM diabetes mellitus; DR diabetic retinopathy; NPDR non-proliferative diabetic retinopathy; PDR proliferative diabetic retinopathy; CSME clinically significant macular edema; DME diabetic macular edema; dbh dot blot hemorrhages; CWS cotton wool spot; POAG primary open angle  glaucoma; C/D cup-to-disc ratio; HVF humphrey visual field; GVF goldmann visual field; OCT optical coherence tomography; IOP intraocular pressure; BRVO Branch retinal vein occlusion; CRVO central retinal vein occlusion; CRAO central retinal artery occlusion; BRAO branch retinal artery occlusion; RT retinal tear; SB scleral buckle; PPV pars plana vitrectomy; VH Vitreous hemorrhage; PRP panretinal laser photocoagulation; IVK intravitreal kenalog; VMT vitreomacular traction; MH Macular hole;  NVD neovascularization of the disc; NVE neovascularization elsewhere; AREDS age related eye disease study; ARMD age related macular degeneration; POAG primary open angle glaucoma; EBMD epithelial/anterior basement membrane dystrophy; ACIOL anterior chamber intraocular lens; IOL intraocular lens; PCIOL posterior chamber intraocular lens; Phaco/IOL phacoemulsification with intraocular lens placement; [place] photorefractive keratectomy; LASIK laser assisted in situ keratomileusis; HTN hypertension; DM diabetes mellitus; COPD chronic obstructive pulmonary disease

## 2021-06-05 ENCOUNTER — Other Ambulatory Visit: Payer: Self-pay

## 2021-06-05 ENCOUNTER — Ambulatory Visit (INDEPENDENT_AMBULATORY_CARE_PROVIDER_SITE_OTHER): Payer: Commercial Managed Care - PPO | Admitting: Ophthalmology

## 2021-06-05 DIAGNOSIS — H25813 Combined forms of age-related cataract, bilateral: Secondary | ICD-10-CM

## 2021-06-05 DIAGNOSIS — I10 Essential (primary) hypertension: Secondary | ICD-10-CM

## 2021-06-05 DIAGNOSIS — H35711 Central serous chorioretinopathy, right eye: Secondary | ICD-10-CM

## 2021-06-05 DIAGNOSIS — H35033 Hypertensive retinopathy, bilateral: Secondary | ICD-10-CM

## 2021-06-05 DIAGNOSIS — H33103 Unspecified retinoschisis, bilateral: Secondary | ICD-10-CM | POA: Diagnosis not present

## 2021-06-05 DIAGNOSIS — H353211 Exudative age-related macular degeneration, right eye, with active choroidal neovascularization: Secondary | ICD-10-CM

## 2021-06-05 DIAGNOSIS — H3581 Retinal edema: Secondary | ICD-10-CM

## 2021-06-09 ENCOUNTER — Encounter (INDEPENDENT_AMBULATORY_CARE_PROVIDER_SITE_OTHER): Payer: Self-pay | Admitting: Ophthalmology

## 2021-06-09 MED ORDER — AFLIBERCEPT 2MG/0.05ML IZ SOLN FOR KALEIDOSCOPE
2.0000 mg | INTRAVITREAL | Status: AC | PRN
  Administered 2021-06-05: 2 mg via INTRAVITREAL

## 2021-07-01 NOTE — Progress Notes (Signed)
[facility] Note  07/03/2021     CHIEF COMPLAINT Patient presents for Retina Follow Up   HISTORY OF PRESENT ILLNESS: [name] is a 50 y.o. female who presents to the clinic today for:  HPI     Retina Follow Up   Patient presents with  Other.  In right eye.  This started 4 weeks ago.  I, the attending physician,  performed the HPI with the patient and updated documentation appropriately.        Comments   Patient here for 4 weeks retina follow up for CSCR OD.  Patient states vision doing good. No eye pain.      Last edited by [name], MD on 07/05/2021 10:45 PM.      Referring physician: No referring provider defined for this encounter.  HISTORICAL INFORMATION:   Selected notes from the MEDICAL RECORD NUMBER Referred by [name] for concern of sub-macular fluid / retinoschisis   CURRENT MEDICATIONS: No current outpatient medications on file. (Ophthalmic Drugs)   No current facility-administered medications for this visit. (Ophthalmic Drugs)   Current Outpatient Medications (Other)  Medication Sig   eplerenone (INSPRA) 50 MG tablet Take 1 tablet by mouth once daily   eplerenone (INSPRA) 50 MG tablet Take 1 tablet (50 mg total) by mouth daily.   hydrochlorothiazide (HYDRODIURIL) 25 MG tablet TAKE 1 TABLET BY MOUTH ONCE DAILY IN THE MORNING FOR 90 DAYS   lisinopril (PRINIVIL,ZESTRIL) 20 MG tablet Take 1 tablet (20 mg total) by mouth daily.   No current facility-administered medications for this visit. (Other)   REVIEW OF SYSTEMS: ROS   Positive for: Eyes, Psychiatric Negative for: Constitutional, Gastrointestinal, Neurological, Skin, Genitourinary, Musculoskeletal, HENT, Endocrine, Cardiovascular, Respiratory, Allergic/Imm, Heme/Lymph Last edited by [name], COA on 07/03/2021  1:57 PM.       ALLERGIES No Known Allergies  PAST MEDICAL HISTORY Past Medical History:  Diagnosis Date    Cataract    Mixed form OU   CIN I (cervical intraepithelial neoplasia I)    Depression    High risk HPV infection    Hypertension    Hypertensive retinopathy    OU   Missed ab AGUST 6 2014   Retina disorder 10/22/2019   pt states taking inspra for excess fluid in retina   Sciatic nerve injury    Past Surgical History:  Procedure Laterality Date   MYOMECTOMY  2008    FAMILY HISTORY Family History  Problem Relation Age of Onset   Hypertension Mother    Diabetes Father    Diabetes Maternal Grandmother    Hypertension Maternal Grandmother    Colon cancer Neg Hx    Colon polyps Neg Hx    Esophageal cancer Neg Hx    Rectal cancer Neg Hx    Stomach cancer Neg Hx     SOCIAL HISTORY Social History   Tobacco Use   Smoking status: Never   Smokeless tobacco: Never  Vaping Use   Vaping Use: Never used  Substance Use Topics   Alcohol use: No    Alcohol/week: 0.0 standard drinks   Drug use: No         OPHTHALMIC EXAM:  Base Eye Exam     Visual Acuity (Snellen - Linear)       Right Left   Dist cc 20/100 -2 20/20 -2   Dist ph cc 20/80 -2     Correction: Glasses  Tonometry (Tonopen, 1:54 PM)       Right Left   Pressure 14 14         Pupils       Dark Light Shape React APD   Right 4 3 Round Brisk None   Left 4 3 Round Brisk None         Visual Fields (Counting fingers)       Left Right    Full Full         Extraocular Movement       Right Left    Full, Ortho Full, Ortho         Neuro/Psych     Oriented x3: Yes   Mood/Affect: Normal         Dilation     Both eyes: 1.0% Mydriacyl, 2.5% Phenylephrine @ 1:54 PM           Slit Lamp and Fundus Exam     Slit Lamp Exam       Right Left   Lids/Lashes Dermatochalasis - upper lid Dermatochalasis - upper lid   Conjunctiva/Sclera Injection, nasal pterygium 1+ Injection, nasal Pinguecula   Cornea Nasal ptergyium extending 2.39mm onto nasal cornea 1+Punctate epithelial  erosions   Anterior Chamber Deep, clear, narrow temporal angle, No cell or flare deep, narrow temporal angle, 1+pigment   Iris Round and dilated Round and dilated   Lens 2+ Nuclear sclerosis, 2+ Cortical cataract 2+ Nuclear sclerosis, 2+ Cortical cataract   Vitreous Mild Vitreous syneresis, Posterior vitreous detachment, vitreous condensations Mild Vitreous syneresis         Fundus Exam       Right Left   Disc Pink and Sharp, +cupping Pink and Sharp, +cupping   C/D Ratio 0.65 0.6   Macula Flat, Blunted foveal reflex, persistent SRF nasal macula - improved, residual shallow pocket of SRF subfoveal, RPE mottling, clumping and atrophy, No heme  Flat, good foveal reflex, Epiretinal membrane, mild RPE mottling and clumping superiorly, No heme or edema   Vessels Vascular attenuation, mild Tortuousity Vascular attenuation, mild Tortuousity   Periphery Attached, bullous Retinoschisis cavity from 0700-0830 -- no associated RT/RD - stable from prior, shallow schisis cavity ST, mild peripheral cystoid degeneration Attached, temporal retinoschisis cavity peripheral -- stable from prior           Refraction     Wearing Rx       Sphere Cylinder Axis   Right +1.25 +1.25 112   Left +2.25 +1.00 101            IMAGING AND PROCEDURES  Imaging and Procedures for @TODAY @  OCT, Retina - OU - Both Eyes       Right Eye Quality was good. Central Foveal Thickness: 251. Progression has improved. Findings include no IRF, outer retinal atrophy, vitreomacular adhesion , abnormal foveal contour (Interval improvement in SRF nasal fovea; residual subfoveal pocket of shallow SRF remains).   Left Eye Quality was good. Central Foveal Thickness: 300. Progression has been stable. Findings include normal foveal contour, no IRF, vitreomacular adhesion , no SRF.   Notes *Images captured and stored on drive  Diagnosis / Impression:  OD: Interval improvement in SRF nasal fovea; residual subfoveal pocket of  shallow SRF remains OS: NFP, no IRF/SRF centrally, +VMA  Clinical management:  See below  Abbreviations: NFP - Normal foveal profile. CME - cystoid macular edema. PED - pigment epithelial detachment. IRF - intraretinal fluid. SRF - subretinal fluid. EZ - ellipsoid zone. ERM - epiretinal membrane.  ORA - outer retinal atrophy. ORT - outer retinal tubulation. SRHM - subretinal hyper-reflective material      Intravitreal Injection, Pharmacologic Agent - OD - Right Eye       Time Out 07/03/2021. 2:49 PM. Confirmed correct patient, procedure, site, and patient consented.   Anesthesia Topical anesthesia was used. Anesthetic medications included Lidocaine 2%, Proparacaine 0.5%.   Procedure Preparation included 5% betadine to ocular surface, eyelid speculum. A (32g) needle was used.   Injection: 2 mg aflibercept 2 MG/0.05ML   Route: Intravitreal, Site: Right Eye   NDC: [id number], Lot: [id number], Expiration date: 03/11/2022, Waste: 0.05 mL   Post-op Post injection exam found visual acuity of at least counting fingers. The patient tolerated the procedure well. There were no complications. The patient received written and verbal post procedure care education. Post injection medications were not given.              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 Exudative age-related macular degeneration of right eye with active choroidal neovascularization (HCC)  H35.3211 Intravitreal Injection, Pharmacologic Agent - OD - Right Eye    aflibercept (EYLEA) SOLN 2 mg    3. Retinal edema  H35.81 OCT, Retina - OU - Both Eyes    4. 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 Essential hypertension  I10     6. Hypertensive retinopathy of both eyes  H35.033     7. Combined forms of age-related cataract of both eyes  H25.813      1-3. CSCR OD -- likely chronic  - pt reports decreased vision OD since December 2019  - reports  significant stressors in life -- work  - denies steroid use  - FA (09.30.20) shows focal hyperfluorescent leakage points nasal macula OD -- expansile dot phenotype?  - started 50 mg po eplerenone since 09.30.20  -- pt self d/c in February, re-started in June 2021  - s/p IVA #1 (09.01.21), #2 (09.29.21), #3 (11.03.21), #4 (12.03.21) -- IVA resistance  - s/p IVE OD #1 (01.20.22), #2 (02.18.22), #3 (04.05.22-sample), #4 (05.23.22), # 5 (06.27.22), #6 (07.28.22), #7 (08.26.22)  - BCVA decreased to 20/80 from 20/70  - OCT shows interval improvement in SRF nasal fovea at 4 wks  - differential includes atypical exudative ARMD  - recommend IVE OD #8 today, 09.23.22 with f/u in 4 weeks  - RBA of procedure discussed, questions answered  - IVA informed consent obtained and signed 09.01.21  - IVE informed consent obtained and signed, 01.20.22  - see procedure note  - recommend cont po eplerenone 50 mg daily  - Eylea[employer]U benefits investigation initiated 12.3.21 -- approved for 2022 -- Eylea[employer]U benefits investigation re-started, 04.05.22, per Lenna Sciara -- will back date application to December  - f/u 4 weeks -- DFE/OCT, possible injection  4. Retinoschisis OU (OD > OS) -- stable  - bullous retinoschisis cavity IT quadrant OD-- 3235-5732 oclock  - shallow retinoschisis cavity IT quadrant OS  - no associated retinal holes/RT/RD on scleral depression   - discussed findings and prognosis   - recommend observation -- consider laser retinopexy if schisis progresses or threatens macula or develops RD  5,6. Hypertensive retinopathy OU  - discussed importance of tight BP control  - monitor  7. 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  - discussed diagnosis and progression  -  not yet visually significant  - monitor for now  Ophthalmic Meds Ordered this visit:  Meds ordered this encounter  Medications    aflibercept (EYLEA) SOLN 2 mg      Return in about 4 weeks (around 07/31/2021) for f/u CSR OD, DFE, OCT.  There are no Patient Instructions on file for this visit.  This document serves as a record of services personally performed by Gardiner Sleeper, MD, PhD. It was created on their behalf by Leonie Douglas, an ophthalmic technician. The creation of this record is the provider's dictation and/or activities during the visit.    Electronically signed by: Leonie Douglas COA, 07/05/21  10:46 PM   Gardiner Sleeper, M.D., Ph.D. Diseases & Surgery of the Retina and [place] 07/03/2021  I have reviewed the above documentation for accuracy and completeness, and I agree with the above. Gardiner Sleeper, M.D., Ph.D. 07/05/21 10:54 PM  Abbreviations: M myopia (nearsighted); A astigmatism; H hyperopia (farsighted); P presbyopia; Mrx spectacle prescription;  CTL contact lenses; OD right eye; OS left eye; OU both eyes  XT exotropia; ET esotropia; PEK punctate epithelial keratitis; PEE punctate epithelial erosions; DES dry eye syndrome; MGD meibomian gland dysfunction; ATs artificial tears; PFAT's preservative free artificial tears; [place] nuclear sclerotic cataract; PSC posterior subcapsular cataract; ERM epi-retinal membrane; PVD posterior vitreous detachment; RD retinal detachment; DM diabetes mellitus; DR diabetic retinopathy; NPDR non-proliferative diabetic retinopathy; PDR proliferative diabetic retinopathy; CSME clinically significant macular edema; DME diabetic macular edema; dbh dot blot hemorrhages; CWS cotton wool spot; POAG primary open angle glaucoma; C/D cup-to-disc ratio; HVF humphrey visual field; GVF goldmann visual field; OCT optical coherence tomography; IOP intraocular pressure; BRVO Branch retinal vein occlusion; CRVO central retinal vein occlusion; CRAO central retinal artery occlusion; BRAO branch retinal artery occlusion; RT retinal tear; SB scleral buckle; PPV  pars plana vitrectomy; VH Vitreous hemorrhage; PRP panretinal laser photocoagulation; IVK intravitreal kenalog; VMT vitreomacular traction; MH Macular hole;  NVD neovascularization of the disc; NVE neovascularization elsewhere; AREDS age related eye disease study; ARMD age related macular degeneration; POAG primary open angle glaucoma; EBMD epithelial/anterior basement membrane dystrophy; ACIOL anterior chamber intraocular lens; IOL intraocular lens; PCIOL posterior chamber intraocular lens; Phaco/IOL phacoemulsification with intraocular lens placement; [place] photorefractive keratectomy; LASIK laser assisted in situ keratomileusis; HTN hypertension; DM diabetes mellitus; COPD chronic obstructive pulmonary disease

## 2021-07-03 ENCOUNTER — Other Ambulatory Visit: Payer: Self-pay

## 2021-07-03 ENCOUNTER — Encounter (INDEPENDENT_AMBULATORY_CARE_PROVIDER_SITE_OTHER): Payer: Self-pay | Admitting: Ophthalmology

## 2021-07-03 ENCOUNTER — Ambulatory Visit (INDEPENDENT_AMBULATORY_CARE_PROVIDER_SITE_OTHER): Payer: Commercial Managed Care - PPO | Admitting: Ophthalmology

## 2021-07-03 DIAGNOSIS — H353211 Exudative age-related macular degeneration, right eye, with active choroidal neovascularization: Secondary | ICD-10-CM

## 2021-07-03 DIAGNOSIS — H33103 Unspecified retinoschisis, bilateral: Secondary | ICD-10-CM

## 2021-07-03 DIAGNOSIS — I10 Essential (primary) hypertension: Secondary | ICD-10-CM | POA: Diagnosis not present

## 2021-07-03 DIAGNOSIS — H3581 Retinal edema: Secondary | ICD-10-CM

## 2021-07-03 DIAGNOSIS — H35033 Hypertensive retinopathy, bilateral: Secondary | ICD-10-CM

## 2021-07-03 DIAGNOSIS — H25813 Combined forms of age-related cataract, bilateral: Secondary | ICD-10-CM

## 2021-07-03 DIAGNOSIS — H35711 Central serous chorioretinopathy, right eye: Secondary | ICD-10-CM

## 2021-07-05 ENCOUNTER — Encounter (INDEPENDENT_AMBULATORY_CARE_PROVIDER_SITE_OTHER): Payer: Self-pay | Admitting: Ophthalmology

## 2021-07-05 MED ORDER — AFLIBERCEPT 2MG/0.05ML IZ SOLN FOR KALEIDOSCOPE
2.0000 mg | INTRAVITREAL | Status: AC | PRN
  Administered 2021-07-03: 2 mg via INTRAVITREAL

## 2021-07-29 NOTE — Progress Notes (Signed)
[facility] Note  07/31/2021     CHIEF COMPLAINT Patient presents for Retina Follow Up   HISTORY OF PRESENT ILLNESS: [name] is a 50 y.o. female who presents to the clinic today for:  HPI     Retina Follow Up   Patient presents with  Other.  In right eye.  This started 4 weeks ago.  I, the attending physician,  performed the HPI with the patient and updated documentation appropriately.        Comments   Patient here for 4 weeks retina follow up for CSCR OD. Patient states vision doing good. No eye pain.       Last edited by [name], MD on 07/31/2021  4:14 PM.    Patient states vision doing good OU. Says stress levels are high. Still taking eplerenone.   Referring physician: No referring provider defined for this encounter.  HISTORICAL INFORMATION:   Selected notes from the MEDICAL RECORD NUMBER Referred by [name] for concern of sub-macular fluid / retinoschisis   CURRENT MEDICATIONS: No current outpatient medications on file. (Ophthalmic Drugs)   No current facility-administered medications for this visit. (Ophthalmic Drugs)   Current Outpatient Medications (Other)  Medication Sig   eplerenone (INSPRA) 50 MG tablet Take 1 tablet by mouth once daily   eplerenone (INSPRA) 50 MG tablet Take 1 tablet (50 mg total) by mouth daily.   hydrochlorothiazide (HYDRODIURIL) 25 MG tablet TAKE 1 TABLET BY MOUTH ONCE DAILY IN THE MORNING FOR 90 DAYS   lisinopril (PRINIVIL,ZESTRIL) 20 MG tablet Take 1 tablet (20 mg total) by mouth daily.   No current facility-administered medications for this visit. (Other)   REVIEW OF SYSTEMS: ROS   Positive for: Eyes, Psychiatric Negative for: Constitutional, Gastrointestinal, Neurological, Skin, Genitourinary, Musculoskeletal, HENT, Endocrine, Cardiovascular, Respiratory, Allergic/Imm, Heme/Lymph Last edited by [name], COA on 07/31/2021  1:57 PM.    ALLERGIES No  Known Allergies  PAST MEDICAL HISTORY Past Medical History:  Diagnosis Date   Cataract    Mixed form OU   CIN I (cervical intraepithelial neoplasia I)    Depression    High risk HPV infection    Hypertension    Hypertensive retinopathy    OU   Missed ab AGUST 6 2014   Retina disorder 10/22/2019   pt states taking inspra for excess fluid in retina   Sciatic nerve injury    Past Surgical History:  Procedure Laterality Date   MYOMECTOMY  2008   FAMILY HISTORY Family History  Problem Relation Age of Onset   Hypertension Mother    Diabetes Father    Diabetes Maternal Grandmother    Hypertension Maternal Grandmother    Colon cancer Neg Hx    Colon polyps Neg Hx    Esophageal cancer Neg Hx    Rectal cancer Neg Hx    Stomach cancer Neg Hx    SOCIAL HISTORY Social History   Tobacco Use   Smoking status: Never   Smokeless tobacco: Never  Vaping Use   Vaping Use: Never used  Substance Use Topics   Alcohol use: No    Alcohol/week: 0.0 standard drinks   Drug use: No       OPHTHALMIC EXAM: Base Eye Exam     Visual Acuity (Snellen - Linear)       Right Left   Dist cc 20/100 -2 20/25 +1   Dist ph cc 20/80 -2 NI    Correction:  Glasses         Tonometry (Tonopen, 1:54 PM)       Right Left   Pressure 12 13         Pupils       Dark Light Shape React APD   Right 4 3 Round Brisk None   Left 4 3 Round Brisk None         Visual Fields (Counting fingers)       Left Right    Full Full         Extraocular Movement       Right Left    Full, Ortho Full, Ortho         Neuro/Psych     Oriented x3: Yes   Mood/Affect: Normal         Dilation     Both eyes: 1.0% Mydriacyl, 2.5% Phenylephrine @ 1:54 PM           Slit Lamp and Fundus Exam     Slit Lamp Exam       Right Left   Lids/Lashes Dermatochalasis - upper lid Dermatochalasis - upper lid   Conjunctiva/Sclera Injection, nasal pterygium 1+ Injection, nasal Pinguecula   Cornea  Nasal ptergyium extending 2.75mm onto nasal cornea 1+Punctate epithelial erosions   Anterior Chamber Deep, clear, narrow temporal angle, No cell or flare deep, narrow temporal angle, 1+pigment   Iris Round and dilated Round and dilated   Lens 2+ Nuclear sclerosis, 2+ Cortical cataract 2+ Nuclear sclerosis, 2+ Cortical cataract   Vitreous Mild Vitreous syneresis, Posterior vitreous detachment, vitreous condensations Mild Vitreous syneresis         Fundus Exam       Right Left   Disc Pink and Sharp, +cupping Pink and Sharp, +cupping   C/D Ratio 0.65 0.6   Macula Flat, Blunted foveal reflex, persistent SRF nasal macula - increased, RPE mottling, clumping and atrophy, No heme  Flat, good foveal reflex, Epiretinal membrane, mild RPE mottling and clumping superiorly, No heme or edema   Vessels Vascular attenuation, mild Tortuousity Vascular attenuation, mild Tortuousity   Periphery Attached, bullous Retinoschisis cavity from 0700-0830 -- no associated RT/RD - stable from prior, shallow schisis cavity ST, mild peripheral cystoid degeneration Attached, temporal retinoschisis cavity peripheral -- stable from prior           Refraction     Wearing Rx       Sphere Cylinder Axis   Right +1.25 +1.25 112   Left +2.25 +1.00 101           IMAGING AND PROCEDURES  Imaging and Procedures for @TODAY @  OCT, Retina - OU - Both Eyes       Right Eye Quality was good. Central Foveal Thickness: 263. Progression has worsened. Findings include no IRF, outer retinal atrophy, vitreomacular adhesion , abnormal foveal contour, subretinal fluid (Interval increase in SRF nasal fovea, persistent retinoschisis inferotemporal caught on widefield).   Left Eye Quality was good. Central Foveal Thickness: 299. Progression has been stable. Findings include normal foveal contour, no IRF, vitreomacular adhesion , no SRF.   Notes *Images captured and stored on drive  Diagnosis / Impression:  OD: Interval  increase in SRF nasal fovea OS: NFP, no IRF/SRF centrally, +VMA  Clinical management:  See below  Abbreviations: NFP - Normal foveal profile. CME - cystoid macular edema. PED - pigment epithelial detachment. IRF - intraretinal fluid. SRF - subretinal fluid. EZ - ellipsoid zone. ERM - epiretinal membrane. ORA - outer retinal  atrophy. ORT - outer retinal tubulation. SRHM - subretinal hyper-reflective material      Intravitreal Injection, Pharmacologic Agent - OD - Right Eye       Time Out 07/31/2021. 2:47 PM. Confirmed correct patient, procedure, site, and patient consented.   Anesthesia Topical anesthesia was used. Anesthetic medications included Lidocaine 2%, Proparacaine 0.5%.   Procedure Preparation included 5% betadine to ocular surface, eyelid speculum. A (32g) needle was used.   Injection: 2 mg aflibercept 2 MG/0.05ML   Route: Intravitreal, Site: Right Eye   NDC: [id number], Lot: [id number], Expiration date: 06/10/2022, Waste: 0.05 mL   Post-op Post injection exam found visual acuity of at least counting fingers. The patient tolerated the procedure well. There were no complications. The patient received written and verbal post procedure care education. Post injection medications were not given.            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 Exudative age-related macular degeneration of right eye with active choroidal neovascularization (HCC)  H35.3211 Intravitreal Injection, Pharmacologic Agent - OD - Right Eye    aflibercept (EYLEA) SOLN 2 mg    3. Retinal edema  H35.81 OCT, Retina - OU - Both Eyes    4. 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 Essential hypertension  I10     6. Hypertensive retinopathy of both eyes  H35.033     7. 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 CSCR  OD -- likely chronic  - pt reports decreased vision OD since December 2019  - reports significant stressors in life -- work  - denies steroid use  - FA (09.30.20) shows focal hyperfluorescent leakage points nasal macula OD -- expansile dot phenotype?  - started 50 mg po eplerenone since 09.30.20  -- pt self d/c in February, re-started in June 2021  - s/p IVA #1 (09.01.21), #2 (09.29.21), #3 (11.03.21), #4 (12.03.21) -- IVA resistance  - s/p IVE OD #1 (01.20.22), #2 (02.18.22), #3 (04.05.22-sample), #4 (05.23.22), # 5 (06.27.22), #6 (07.28.22), #7 (08.26.22), #8 (09.23.22)  - BCVA stable at 20/80-2  - OCT shows interval increase in SRF nasal fovea at 4 wks -- pt reports increased work stress last several weeks  - differential includes atypical exudative ARMD  - recommend IVE OD #9 today, 10.21.22 with f/u in 4 weeks  - RBA of procedure discussed, questions answered  - IVA informed consent obtained and signed 09.01.21  - IVE informed consent obtained and signed, 01.20.22  - see procedure note  - recommend cont po eplerenone 50 mg daily  - Eylea[employer]U benefits investigation initiated 12.3.21 -- approved for 2022 -- Eylea[employer] benefits investigation re-started, 04.05.22, per Lenna Sciara -- will back date application to December  - f/u 4 weeks -- DFE/OCT, possible injection  4. Retinoschisis OU (OD > OS) -- stable  - bullous retinoschisis cavity IT quadrant OD-- 5361-4431 oclock  - shallow retinoschisis cavity IT quadrant OS  - no associated retinal holes/RT/RD on scleral depression   - discussed findings and prognosis   - recommend observation -- consider laser retinopexy if schisis progresses or threatens macula or develops RD   5,6. Hypertensive retinopathy OU  - discussed importance of tight BP control  - monitor   7. Mixed form age related cataracts OU  -  The symptoms of cataract, surgical options, and treatments and risks were discussed with patient.  - discussed diagnosis and progression  -  not yet visually significant  - monitor for now  Ophthalmic Meds Ordered this visit:  Meds ordered this encounter  Medications   aflibercept (EYLEA) SOLN 2 mg     Return in about 4 weeks (around 08/28/2021) for DFE, OCT.  There are no Patient Instructions on file for this visit.  This document serves as a record of services personally performed by Gardiner Sleeper, MD, PhD. It was created on their behalf by Leonie Douglas, an ophthalmic technician. The creation of this record is the provider's dictation and/or activities during the visit.    Electronically signed by: Leonie Douglas COA, 07/31/21  4:19 PM  This document serves as a record of services personally performed by Gardiner Sleeper, MD, PhD. It was created on their behalf by Roselee Nova, COMT. The creation of this record is the provider's dictation and/or activities during the visit.  Electronically signed by: Roselee Nova, COMT 07/31/21 4:19 PM  Gardiner Sleeper, M.D., Ph.D. Diseases & Surgery of the Retina and [place] 07/31/2021  I have reviewed the above documentation for accuracy and completeness, and I agree with the above. Gardiner Sleeper, M.D., Ph.D. 07/31/21 4:19 PM  Abbreviations: M myopia (nearsighted); A astigmatism; H hyperopia (farsighted); P presbyopia; Mrx spectacle prescription;  CTL contact lenses; OD right eye; OS left eye; OU both eyes  XT exotropia; ET esotropia; PEK punctate epithelial keratitis; PEE punctate epithelial erosions; DES dry eye syndrome; MGD meibomian gland dysfunction; ATs artificial tears; PFAT's preservative free artificial tears; [place] nuclear sclerotic cataract; PSC posterior subcapsular cataract; ERM epi-retinal membrane; PVD posterior vitreous detachment; RD retinal detachment; DM diabetes mellitus; DR diabetic retinopathy; NPDR non-proliferative diabetic retinopathy; PDR proliferative diabetic retinopathy; CSME clinically significant macular edema; DME  diabetic macular edema; dbh dot blot hemorrhages; CWS cotton wool spot; POAG primary open angle glaucoma; C/D cup-to-disc ratio; HVF humphrey visual field; GVF goldmann visual field; OCT optical coherence tomography; IOP intraocular pressure; BRVO Branch retinal vein occlusion; CRVO central retinal vein occlusion; CRAO central retinal artery occlusion; BRAO branch retinal artery occlusion; RT retinal tear; SB scleral buckle; PPV pars plana vitrectomy; VH Vitreous hemorrhage; PRP panretinal laser photocoagulation; IVK intravitreal kenalog; VMT vitreomacular traction; MH Macular hole;  NVD neovascularization of the disc; NVE neovascularization elsewhere; AREDS age related eye disease study; ARMD age related macular degeneration; POAG primary open angle glaucoma; EBMD epithelial/anterior basement membrane dystrophy; ACIOL anterior chamber intraocular lens; IOL intraocular lens; PCIOL posterior chamber intraocular lens; Phaco/IOL phacoemulsification with intraocular lens placement; [place] photorefractive keratectomy; LASIK laser assisted in situ keratomileusis; HTN hypertension; DM diabetes mellitus; COPD chronic obstructive pulmonary disease

## 2021-07-31 ENCOUNTER — Ambulatory Visit (INDEPENDENT_AMBULATORY_CARE_PROVIDER_SITE_OTHER): Payer: Commercial Managed Care - PPO | Admitting: Ophthalmology

## 2021-07-31 ENCOUNTER — Other Ambulatory Visit: Payer: Self-pay

## 2021-07-31 ENCOUNTER — Encounter (INDEPENDENT_AMBULATORY_CARE_PROVIDER_SITE_OTHER): Payer: Self-pay | Admitting: Ophthalmology

## 2021-07-31 DIAGNOSIS — H35033 Hypertensive retinopathy, bilateral: Secondary | ICD-10-CM

## 2021-07-31 DIAGNOSIS — H33103 Unspecified retinoschisis, bilateral: Secondary | ICD-10-CM

## 2021-07-31 DIAGNOSIS — I10 Essential (primary) hypertension: Secondary | ICD-10-CM | POA: Diagnosis not present

## 2021-07-31 DIAGNOSIS — H353211 Exudative age-related macular degeneration, right eye, with active choroidal neovascularization: Secondary | ICD-10-CM

## 2021-07-31 DIAGNOSIS — H3581 Retinal edema: Secondary | ICD-10-CM

## 2021-07-31 DIAGNOSIS — H35711 Central serous chorioretinopathy, right eye: Secondary | ICD-10-CM | POA: Diagnosis not present

## 2021-07-31 DIAGNOSIS — H25813 Combined forms of age-related cataract, bilateral: Secondary | ICD-10-CM

## 2021-07-31 MED ORDER — AFLIBERCEPT 2MG/0.05ML IZ SOLN FOR KALEIDOSCOPE
2.0000 mg | INTRAVITREAL | Status: AC | PRN
  Administered 2021-07-31: 2 mg via INTRAVITREAL

## 2021-08-25 NOTE — Progress Notes (Signed)
[facility] Note  08/28/2021     CHIEF COMPLAINT Patient presents for Retina Follow Up   HISTORY OF PRESENT ILLNESS: [name] is a 50 y.o. female who presents to the clinic today for:  HPI     Retina Follow Up   Patient presents with  Other.  In right eye.  This started 4 weeks ago.  I, the attending physician,  performed the HPI with the patient and updated documentation appropriately.        Comments   Patient here for 4 weeks retina follow up for CSCR OD. Patient states vision doing ok. No eye pain.       Last edited by [name], MD on 08/28/2021  9:08 PM.     Patient states    Referring physician: No referring provider defined for this encounter.  HISTORICAL INFORMATION:   Selected notes from the MEDICAL RECORD NUMBER Referred by [name] for concern of sub-macular fluid / retinoschisis   CURRENT MEDICATIONS: No current outpatient medications on file. (Ophthalmic Drugs)   No current facility-administered medications for this visit. (Ophthalmic Drugs)   Current Outpatient Medications (Other)  Medication Sig   eplerenone (INSPRA) 50 MG tablet Take 1 tablet by mouth once daily   eplerenone (INSPRA) 50 MG tablet Take 1 tablet (50 mg total) by mouth daily.   hydrochlorothiazide (HYDRODIURIL) 25 MG tablet TAKE 1 TABLET BY MOUTH ONCE DAILY IN THE MORNING FOR 90 DAYS   lisinopril (PRINIVIL,ZESTRIL) 20 MG tablet Take 1 tablet (20 mg total) by mouth daily.   No current facility-administered medications for this visit. (Other)   REVIEW OF SYSTEMS: ROS   Positive for: Eyes, Psychiatric Negative for: Constitutional, Gastrointestinal, Neurological, Skin, Genitourinary, Musculoskeletal, HENT, Endocrine, Cardiovascular, Respiratory, Allergic/Imm, Heme/Lymph Last edited by [name], COA on 08/28/2021  1:13 PM.     ALLERGIES No Known Allergies  PAST MEDICAL HISTORY Past Medical History:   Diagnosis Date   Cataract    Mixed form OU   CIN I (cervical intraepithelial neoplasia I)    Depression    High risk HPV infection    Hypertension    Hypertensive retinopathy    OU   Missed ab AGUST 6 2014   Retina disorder 10/22/2019   pt states taking inspra for excess fluid in retina   Sciatic nerve injury    Past Surgical History:  Procedure Laterality Date   MYOMECTOMY  2008   FAMILY HISTORY Family History  Problem Relation Age of Onset   Hypertension Mother    Diabetes Father    Diabetes Maternal Grandmother    Hypertension Maternal Grandmother    Colon cancer Neg Hx    Colon polyps Neg Hx    Esophageal cancer Neg Hx    Rectal cancer Neg Hx    Stomach cancer Neg Hx    SOCIAL HISTORY Social History   Tobacco Use   Smoking status: Never   Smokeless tobacco: Never  Vaping Use   Vaping Use: Never used  Substance Use Topics   Alcohol use: No    Alcohol/week: 0.0 standard drinks   Drug use: No       OPHTHALMIC EXAM: Base Eye Exam     Visual Acuity (Snellen - Linear)       Right Left   Dist cc 20/100 -1 20/20 -1   Dist ph cc 20/100 +2     Correction: Glasses  Tonometry (Tonopen, 1:10 PM)       Right Left   Pressure 20 15         Pupils       Dark Light Shape React APD   Right 4 3 Round Brisk None   Left 4 3 Round Brisk None         Visual Fields (Counting fingers)       Left Right    Full Full         Extraocular Movement       Right Left    Full, Ortho Full, Ortho         Neuro/Psych     Oriented x3: Yes   Mood/Affect: Normal         Dilation     Both eyes: 1.0% Mydriacyl, 2.5% Phenylephrine @ 1:10 PM           Slit Lamp and Fundus Exam     Slit Lamp Exam       Right Left   Lids/Lashes Dermatochalasis - upper lid Dermatochalasis - upper lid   Conjunctiva/Sclera Injection, nasal pterygium 1+ Injection, nasal Pinguecula   Cornea Nasal ptergyium extending 2.37mm onto nasal cornea 1+Punctate  epithelial erosions   Anterior Chamber Deep, clear, narrow temporal angle, No cell or flare deep, narrow temporal angle, 1+pigment   Iris Round and dilated Round and dilated   Lens 2+ Nuclear sclerosis, 2+ Cortical cataract 2+ Nuclear sclerosis, 2+ Cortical cataract   Anterior Vitreous Mild Vitreous syneresis, Posterior vitreous detachment, vitreous condensations Mild Vitreous syneresis         Fundus Exam       Right Left   Disc Pink and Sharp, +cupping Pink and Sharp, +cupping   C/D Ratio 0.65 0.6   Macula Flat, Blunted foveal reflex, persistent SRF nasal macula, RPE mottling, clumping and atrophy, No heme Flat, good foveal reflex, Epiretinal membrane, mild RPE mottling and clumping superiorly, No heme or edema   Vessels attenuated, Tortuous Vascular attenuation, mild Tortuousity   Periphery Attached, bullous Retinoschisis cavity from 0700-0830 -- no associated RT/RD - stable from prior, shallow schisis cavity ST, mild peripheral cystoid degeneration Attached, temporal retinoschisis cavity peripheral -- stable from prior           Refraction     Wearing Rx       Sphere Cylinder Axis   Right +1.25 +1.25 112   Left +2.25 +1.00 101           IMAGING AND PROCEDURES  Imaging and Procedures for @TODAY @  OCT, Retina - OU - Both Eyes       Right Eye Quality was good. Central Foveal Thickness: 236. Progression has been stable. Findings include no IRF, outer retinal atrophy, vitreomacular adhesion , abnormal foveal contour, subretinal fluid (Persistent SRF nasal and central macula, persistent retinoschisis inferotemporal caught on widefield).   Left Eye Quality was good. Central Foveal Thickness: 296. Progression has been stable. Findings include normal foveal contour, no IRF, vitreomacular adhesion , no SRF.   Notes *Images captured and stored on drive  Diagnosis / Impression:  OD: Persistent SRF nasal and central macula, persistent retinoschisis inferotemporal caught on  widefield OS: NFP, no IRF/SRF centrally, +VMA  Clinical management:  See below  Abbreviations: NFP - Normal foveal profile. CME - cystoid macular edema. PED - pigment epithelial detachment. IRF - intraretinal fluid. SRF - subretinal fluid. EZ - ellipsoid zone. ERM - epiretinal membrane. ORA - outer retinal atrophy. ORT - outer retinal tubulation.  SRHM - subretinal hyper-reflective material      Intravitreal Injection, Pharmacologic Agent - OD - Right Eye       Time Out 08/28/2021. 2:00 PM. Confirmed correct patient, procedure, site, and patient consented.   Anesthesia Topical anesthesia was used. Anesthetic medications included Lidocaine 2%, Proparacaine 0.5%.   Procedure Preparation included 5% betadine to ocular surface, eyelid speculum. A (32g) needle was used.   Injection: 2 mg aflibercept 2 MG/0.05ML   Route: Intravitreal, Site: Right Eye   NDC: [id number], Lot: [id number], Expiration date: 07/10/2022, Waste: 0.05 mL   Post-op Post injection exam found visual acuity of at least counting fingers. The patient tolerated the procedure well. There were no complications. The patient received written and verbal post procedure care education. Post injection medications were not given.             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 Exudative age-related macular degeneration of right eye with active choroidal neovascularization (HCC)  H35.3211 Intravitreal Injection, Pharmacologic Agent - OD - Right Eye    aflibercept (EYLEA) SOLN 2 mg    3. Retinal edema  H35.81 OCT, Retina - OU - Both Eyes    4. 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 Essential hypertension  I10     6. Hypertensive retinopathy of both eyes  H35.033     7. Combined forms of age-related cataract of both eyes  H25.813      1-3. CSCR OD -- chronic  - pt reports decreased vision OD since December 2019  - reports significant stressors in  life -- work  - denies steroid use  - FA (09.30.20) shows focal hyperfluorescent leakage points nasal macula OD -- expansile dot phenotype?  - started 50 mg po eplerenone since 09.30.20  -- pt self d/c in February, re-started in June 2021  - s/p IVA #1 (09.01.21), #2 (09.29.21), #3 (11.03.21), #4 (12.03.21) -- IVA resistance  - s/p IVE OD #1 (01.20.22), #2 (02.18.22), #3 (04.05.22-sample), #4 (05.23.22), # 5 (06.27.22), #6 (07.28.22), #7 (08.26.22), #8 (09.23.22), #9 (10.21.22)  - BCVA 20/100 from 20/80-2  - OCT shows persistent SRF nasal and central macula, persistent retinoschisis inferotemporal caught on widefield  - differential includes atypical exudative ARMD  - recommend IVE OD #10 today, 11.18.22 with f/u in 4 weeks  - RBA of procedure discussed, questions answered  - IVA informed consent obtained and signed 09.01.21  - IVE informed consent obtained and signed, 01.20.22  - see procedure note  - recommend cont po eplerenone 50 mg daily  - Eylea4U benefits investigation initiated 12.3.21 -- approved for 2022 -- Eylea4U benefits investigation re-started, 04.05.22, per Lenna Sciara -- will back date application to December  - continue eplerenone 50 mg daily  - f/u 4 weeks -- DFE/OCT, possible injection  4. Retinoschisis OU (OD > OS) -- stable  - bullous retinoschisis cavity IT quadrant OD-- [id number] oclock  - shallow retinoschisis cavity IT quadrant OS  - no associated retinal holes/RT/RD on scleral depression  - discussed findings and prognosis   - recommend observation -- consider laser retinopexy if schisis progresses or threatens macula or develops RD   5,6. Hypertensive retinopathy OU  - discussed importance of tight BP control  - monitor   7. 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  - discussed diagnosis and  progression  - not yet visually significant  - monitor for  now  Ophthalmic Meds Ordered this visit:  Meds ordered this encounter  Medications   aflibercept (EYLEA) SOLN 2 mg      Return in about 4 weeks (around 09/25/2021) for f/u exu ARMD OD, DFE, OCT.  There are no Patient Instructions on file for this visit.  This document serves as a record of services personally performed by Gardiner Sleeper, MD, PhD. It was created on their behalf by Leonie Douglas, an ophthalmic technician. The creation of this record is the provider's dictation and/or activities during the visit.    Electronically signed by: Leonie Douglas COA, 08/28/21  9:11 PM  This document serves as a record of services personally performed by Gardiner Sleeper, MD, PhD. It was created on their behalf by San Jetty. Owens Shark, OA an ophthalmic technician. The creation of this record is the provider's dictation and/or activities during the visit.    Electronically signed by: San Jetty. Owens Shark, [place] 11.18.2022 9:11 PM   Gardiner Sleeper, M.D., Ph.D. Diseases & Surgery of the Retina and [place] 08/28/2021  I have reviewed the above documentation for accuracy and completeness, and I agree with the above. Gardiner Sleeper, M.D., Ph.D. 08/28/21 9:11 PM  Abbreviations: M myopia (nearsighted); A astigmatism; H hyperopia (farsighted); P presbyopia; Mrx spectacle prescription;  CTL contact lenses; OD right eye; OS left eye; OU both eyes  XT exotropia; ET esotropia; PEK punctate epithelial keratitis; PEE punctate epithelial erosions; DES dry eye syndrome; MGD meibomian gland dysfunction; ATs artificial tears; PFAT's preservative free artificial tears; [place] nuclear sclerotic cataract; PSC posterior subcapsular cataract; ERM epi-retinal membrane; PVD posterior vitreous detachment; RD retinal detachment; DM diabetes mellitus; DR diabetic retinopathy; NPDR non-proliferative diabetic retinopathy; PDR proliferative diabetic retinopathy; CSME clinically significant macular edema; DME  diabetic macular edema; dbh dot blot hemorrhages; CWS cotton wool spot; POAG primary open angle glaucoma; C/D cup-to-disc ratio; HVF humphrey visual field; GVF goldmann visual field; OCT optical coherence tomography; IOP intraocular pressure; BRVO Branch retinal vein occlusion; CRVO central retinal vein occlusion; CRAO central retinal artery occlusion; BRAO branch retinal artery occlusion; RT retinal tear; SB scleral buckle; PPV pars plana vitrectomy; VH Vitreous hemorrhage; PRP panretinal laser photocoagulation; IVK intravitreal kenalog; VMT vitreomacular traction; MH Macular hole;  NVD neovascularization of the disc; NVE neovascularization elsewhere; AREDS age related eye disease study; ARMD age related macular degeneration; POAG primary open angle glaucoma; EBMD epithelial/anterior basement membrane dystrophy; ACIOL anterior chamber intraocular lens; IOL intraocular lens; PCIOL posterior chamber intraocular lens; Phaco/IOL phacoemulsification with intraocular lens placement; [place] photorefractive keratectomy; LASIK laser assisted in situ keratomileusis; HTN hypertension; DM diabetes mellitus; COPD chronic obstructive pulmonary disease

## 2021-08-28 ENCOUNTER — Encounter (INDEPENDENT_AMBULATORY_CARE_PROVIDER_SITE_OTHER): Payer: Self-pay | Admitting: Ophthalmology

## 2021-08-28 ENCOUNTER — Other Ambulatory Visit: Payer: Self-pay

## 2021-08-28 ENCOUNTER — Ambulatory Visit (INDEPENDENT_AMBULATORY_CARE_PROVIDER_SITE_OTHER): Payer: Commercial Managed Care - PPO | Admitting: Ophthalmology

## 2021-08-28 DIAGNOSIS — H35033 Hypertensive retinopathy, bilateral: Secondary | ICD-10-CM

## 2021-08-28 DIAGNOSIS — H33103 Unspecified retinoschisis, bilateral: Secondary | ICD-10-CM | POA: Diagnosis not present

## 2021-08-28 DIAGNOSIS — H35711 Central serous chorioretinopathy, right eye: Secondary | ICD-10-CM | POA: Diagnosis not present

## 2021-08-28 DIAGNOSIS — I10 Essential (primary) hypertension: Secondary | ICD-10-CM | POA: Diagnosis not present

## 2021-08-28 DIAGNOSIS — H353211 Exudative age-related macular degeneration, right eye, with active choroidal neovascularization: Secondary | ICD-10-CM

## 2021-08-28 DIAGNOSIS — H3581 Retinal edema: Secondary | ICD-10-CM

## 2021-08-28 DIAGNOSIS — H25813 Combined forms of age-related cataract, bilateral: Secondary | ICD-10-CM

## 2021-08-28 MED ORDER — AFLIBERCEPT 2MG/0.05ML IZ SOLN FOR KALEIDOSCOPE
2.0000 mg | INTRAVITREAL | Status: AC | PRN
  Administered 2021-08-28: 2 mg via INTRAVITREAL

## 2021-09-23 NOTE — Progress Notes (Addendum)
[facility] Note  09/25/2021     CHIEF COMPLAINT Patient presents for Retina Follow Up   HISTORY OF PRESENT ILLNESS: [name] is a 50 y.o. female who presents to the clinic today for:  HPI     Retina Follow Up   Patient presents with  Other.  In right eye.  Severity is moderate.  Duration of 4 weeks.  Since onset it is stable.  I, the attending physician,  performed the HPI with the patient and updated documentation appropriately.        Comments   Pt here for 4 wk ret f/u CSC OD. Pt states no vision changes or issues.       Last edited by [name], MD on 09/25/2021  3:58 PM.    Pt reports much less stress in her life.  Referring physician: Shawnie Dapper, DO [address] [place],  [place] [address]  HISTORICAL INFORMATION:   Selected notes from the MEDICAL RECORD NUMBER Referred by [name] for concern of sub-macular fluid / retinoschisis   CURRENT MEDICATIONS: No current outpatient medications on file. (Ophthalmic Drugs)   No current facility-administered medications for this visit. (Ophthalmic Drugs)   Current Outpatient Medications (Other)  Medication Sig   eplerenone (INSPRA) 50 MG tablet Take 1 tablet by mouth once daily   eplerenone (INSPRA) 50 MG tablet Take 1 tablet (50 mg total) by mouth daily.   hydrochlorothiazide (HYDRODIURIL) 25 MG tablet TAKE 1 TABLET BY MOUTH ONCE DAILY IN THE MORNING FOR 90 DAYS   lisinopril (PRINIVIL,ZESTRIL) 20 MG tablet Take 1 tablet (20 mg total) by mouth daily.   No current facility-administered medications for this visit. (Other)   REVIEW OF SYSTEMS: ROS   Positive for: Eyes, Psychiatric Negative for: Constitutional, Gastrointestinal, Neurological, Skin, Genitourinary, Musculoskeletal, HENT, Endocrine, Cardiovascular, Respiratory, Allergic/Imm, Heme/Lymph Last edited by Kingsley Spittle, COT on 09/25/2021  1:29 PM.     ALLERGIES No Known  Allergies  PAST MEDICAL HISTORY Past Medical History:  Diagnosis Date   Cataract    Mixed form OU   CIN I (cervical intraepithelial neoplasia I)    Depression    High risk HPV infection    Hypertension    Hypertensive retinopathy    OU   Missed ab AGUST 6 2014   Retina disorder 10/22/2019   pt states taking inspra for excess fluid in retina   Sciatic nerve injury    Past Surgical History:  Procedure Laterality Date   MYOMECTOMY  2008   FAMILY HISTORY Family History  Problem Relation Age of Onset   Hypertension Mother    Diabetes Father    Diabetes Maternal Grandmother    Hypertension Maternal Grandmother    Colon cancer Neg Hx    Colon polyps Neg Hx    Esophageal cancer Neg Hx    Rectal cancer Neg Hx    Stomach cancer Neg Hx    SOCIAL HISTORY Social History   Tobacco Use   Smoking status: Never   Smokeless tobacco: Never  Vaping Use   Vaping Use: Never used  Substance Use Topics   Alcohol use: No    Alcohol/week: 0.0 standard drinks   Drug use: No       OPHTHALMIC EXAM: Base Eye Exam     Visual Acuity (Snellen - Linear)       Right Left   Dist cc 20/80 -2 20/20 -1   Dist ph cc NI  Correction: Glasses         Tonometry (Tonopen, 1:35 PM)       Right Left   Pressure 15 13         Pupils       Dark Light Shape React APD   Right 4 3 Round Brisk None   Left 4 3 Round Brisk None         Visual Fields (Counting fingers)       Left Right    Full Full         Extraocular Movement       Right Left    Full, Ortho Full, Ortho         Neuro/Psych     Oriented x3: Yes   Mood/Affect: Normal         Dilation     Both eyes: 1.0% Mydriacyl, 2.5% Phenylephrine @ 1:36 PM           Slit Lamp and Fundus Exam     Slit Lamp Exam       Right Left   Lids/Lashes Dermatochalasis - upper lid Dermatochalasis - upper lid   Conjunctiva/Sclera Injection, nasal pterygium 1+ Injection, nasal Pinguecula   Cornea Nasal ptergyium  extending 2.23mm onto nasal cornea 1+Punctate epithelial erosions   Anterior Chamber Deep, clear, narrow temporal angle, No cell or flare deep, narrow temporal angle, 1+pigment   Iris Round and dilated Round and dilated   Lens 2+ Nuclear sclerosis, 2+ Cortical cataract 2+ Nuclear sclerosis, 2+ Cortical cataract   Anterior Vitreous Mild Vitreous syneresis, Posterior vitreous detachment, vitreous condensations Mild Vitreous syneresis         Fundus Exam       Right Left   Disc Pink and Sharp, +cupping Pink and Sharp, +cupping   C/D Ratio 0.65 0.6   Macula Flat, Blunted foveal reflex, SRF nasal macula--improved, RPE mottling, clumping and atrophy, No heme Flat, good foveal reflex, Epiretinal membrane, mild RPE mottling and clumping superiorly, No heme or edema   Vessels attenuated, Tortuous Vascular attenuation, mild Tortuousity   Periphery Attached, bullous Retinoschisis cavity from 0700-0830 -- no associated RT/RD - stable from prior, shallow schisis cavity ST, mild peripheral cystoid degeneration Attached, temporal retinoschisis cavity peripheral -- stable from prior           Refraction     Wearing Rx       Sphere Cylinder Axis   Right +1.25 +1.25 112   Left +2.25 +1.00 101           IMAGING AND PROCEDURES  Imaging and Procedures for $RemoveBefore'@[email or web address]'[id number]$ @  OCT, Retina - OU - Both Eyes       Right Eye Quality was good. Central Foveal Thickness: 218. Progression has improved. Findings include no IRF, outer retinal atrophy, vitreomacular adhesion , abnormal foveal contour, no SRF, intraretinal hyper-reflective material (Interval improvement in SRF nasal macula; persistent bullous schisis caught IT periphery on widefield -- stable from prior).   Left Eye Quality was good. Central Foveal Thickness: 299. Progression has been stable. Findings include normal foveal contour, no IRF, vitreomacular adhesion , no SRF.   Notes *Images captured and stored on drive  Diagnosis / Impression:   OD: Interval improvement in SRF nasal macula; +ORA nasal macula; persistent bullous schisis caught IT periphery on widefield -- stable from prior OS: NFP, no IRF/SRF centrally, +VMA  Clinical management:  See below  Abbreviations: NFP - Normal foveal profile. CME - cystoid macular edema. PED - pigment epithelial detachment.  IRF - intraretinal fluid. SRF - subretinal fluid. EZ - ellipsoid zone. ERM - epiretinal membrane. ORA - outer retinal atrophy. ORT - outer retinal tubulation. SRHM - subretinal hyper-reflective material      Intravitreal Injection, Pharmacologic Agent - OD - Right Eye       Time Out 09/25/2021. 2:28 PM. Confirmed correct patient, procedure, site, and patient consented.   Anesthesia Topical anesthesia was used. Anesthetic medications included Lidocaine 2%, Proparacaine 0.5%.   Procedure Preparation included 5% betadine to ocular surface, eyelid speculum. A (32g) needle was used.   Injection: 2 mg aflibercept 2 MG/0.05ML   Route: Intravitreal, Site: Right Eye   NDC: [id number], Lot: [id number], Expiration date: 06/10/2022, Waste: 0.05 mL   Post-op Post injection exam found visual acuity of at least counting fingers. The patient tolerated the procedure well. There were no complications. The patient received written and verbal post procedure care education. Post injection medications were not given.            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 Essential hypertension  I10     5.  Hypertensive retinopathy of both eyes  H35.033     6. Combined forms of age-related cataract of both eyes  H25.813      1,2. CSCR OD -- chronic  - pt reports decreased vision OD since December 2019  - reports significant stressors in life -- work  - denies steroid use  - FA (09.30.20) shows focal hyperfluorescent leakage points nasal macula OD -- expansile dot phenotype?  - started 50 mg po eplerenone since 09.30.20  -- pt self d/c in February, re-started in June 2021  - s/p IVA #1 (09.01.21), #2 (09.29.21), #3 (11.03.21), #4 (12.03.21) -- IVA resistance  - s/p IVE OD #1 (01.20.22), #2 (02.18.22), #3 (04.05.22-sample), #4 (05.23.22), # 5 (06.27.22), #6 (07.28.22), #7 (08.26.22), #8 (09.23.22), #9 (10.21.22), #10 (11.18.22)  - BCVA 20/80 from 20/100  - OCT shows interval improvement in SRF nasal macula; +ORA nasal macula  - differential includes atypical exudative ARMD  - recommend IVE OD #11 today, 12.16.22 with f/u in 5 weeks  - RBA of procedure discussed, questions answered  - IVA informed consent obtained and signed 09.01.21  - IVE informed consent obtained and signed, 01.20.22  - see procedure note  - recommend cont po eplerenone 50 mg daily  - Eylea[employer] benefits investigation initiated 12.3.21 -- approved for 2022  - continue eplerenone 50 mg daily  - f/u 5 weeks -- DFE/OCT, possible injection  3. Retinoschisis OU (OD > OS) -- stable  - bullous retinoschisis cavity IT quadrant OD-- 5732-2025 oclock  - shallow retinoschisis cavity IT quadrant OS  - no associated retinal holes/RT/RD on scleral depression  - widefield OCT shows stable bullous schisis OD  - discussed findings and prognosis   - recommend observation -- consider laser retinopexy if schisis progresses or threatens macula or develops RD   4,5. Hypertensive retinopathy OU  -  discussed importance of tight BP control  - monitor   6. Mixed form age related cataracts OU  - The symptoms of cataract, surgical options, and  treatments and risks were discussed with patient.  - discussed diagnosis and progression  - not yet visually significant  - monitor for now  Ophthalmic Meds Ordered this visit:  Meds ordered this encounter  Medications   aflibercept (EYLEA) SOLN 2 mg     Return in about 5 weeks (around 10/30/2021) for 5 wk f/u for CSCR OD w/DFE/OCT/poss. inj. OD.  There are no Patient Instructions on file for this visit.  This document serves as a record of services personally performed by Gardiner Sleeper, MD, PhD. It was created on their behalf by Estill Bakes, COT an ophthalmic technician. The creation of this record is the provider's dictation and/or activities during the visit.    Electronically signed by: Estill Bakes, COT 12.16.22 @ 4:00 PM   Gardiner Sleeper, M.D., Ph.D. Diseases & Surgery of the Retina and Waves 09/25/2021  I have reviewed the above documentation for accuracy and completeness, and I agree with the above. Gardiner Sleeper, M.D., Ph.D. 09/25/21 4:01 PM   Abbreviations: M myopia (nearsighted); A astigmatism; H hyperopia (farsighted); P presbyopia; Mrx spectacle prescription;  CTL contact lenses; OD right eye; OS left eye; OU both eyes  XT exotropia; ET esotropia; PEK punctate epithelial keratitis; PEE punctate epithelial erosions; DES dry eye syndrome; MGD meibomian gland dysfunction; ATs artificial tears; PFAT's preservative free artificial tears; Hooks nuclear sclerotic cataract; PSC posterior subcapsular cataract; ERM epi-retinal membrane; PVD posterior vitreous detachment; RD retinal detachment; DM diabetes mellitus; DR diabetic retinopathy; NPDR non-proliferative diabetic retinopathy; PDR proliferative diabetic retinopathy; CSME clinically significant macular edema; DME diabetic macular edema; dbh dot blot hemorrhages; CWS cotton wool spot; POAG primary open angle glaucoma; C/D cup-to-disc ratio; HVF humphrey visual field; GVF goldmann visual  field; OCT optical coherence tomography; IOP intraocular pressure; BRVO Branch retinal vein occlusion; CRVO central retinal vein occlusion; CRAO central retinal artery occlusion; BRAO branch retinal artery occlusion; RT retinal tear; SB scleral buckle; PPV pars plana vitrectomy; VH Vitreous hemorrhage; PRP panretinal laser photocoagulation; IVK intravitreal kenalog; VMT vitreomacular traction; MH Macular hole;  NVD neovascularization of the disc; NVE neovascularization elsewhere; AREDS age related eye disease study; ARMD age related macular degeneration; POAG primary open angle glaucoma; EBMD epithelial/anterior basement membrane dystrophy; ACIOL anterior chamber intraocular lens; IOL intraocular lens; PCIOL posterior chamber intraocular lens; Phaco/IOL phacoemulsification with intraocular lens placement; Industry photorefractive keratectomy; LASIK laser assisted in situ keratomileusis; HTN hypertension; DM diabetes mellitus; COPD chronic obstructive pulmonary disease

## 2021-09-25 ENCOUNTER — Encounter (INDEPENDENT_AMBULATORY_CARE_PROVIDER_SITE_OTHER): Payer: Self-pay | Admitting: Ophthalmology

## 2021-09-25 ENCOUNTER — Other Ambulatory Visit: Payer: Self-pay

## 2021-09-25 ENCOUNTER — Ambulatory Visit (INDEPENDENT_AMBULATORY_CARE_PROVIDER_SITE_OTHER): Payer: Commercial Managed Care - PPO | Admitting: Ophthalmology

## 2021-09-25 DIAGNOSIS — H35711 Central serous chorioretinopathy, right eye: Secondary | ICD-10-CM

## 2021-09-25 DIAGNOSIS — I10 Essential (primary) hypertension: Secondary | ICD-10-CM

## 2021-09-25 DIAGNOSIS — H35033 Hypertensive retinopathy, bilateral: Secondary | ICD-10-CM

## 2021-09-25 DIAGNOSIS — H3581 Retinal edema: Secondary | ICD-10-CM

## 2021-09-25 DIAGNOSIS — H33103 Unspecified retinoschisis, bilateral: Secondary | ICD-10-CM

## 2021-09-25 DIAGNOSIS — H353211 Exudative age-related macular degeneration, right eye, with active choroidal neovascularization: Secondary | ICD-10-CM | POA: Diagnosis not present

## 2021-09-25 DIAGNOSIS — H25813 Combined forms of age-related cataract, bilateral: Secondary | ICD-10-CM

## 2021-09-25 MED ORDER — AFLIBERCEPT 2MG/0.05ML IZ SOLN FOR KALEIDOSCOPE
2.0000 mg | INTRAVITREAL | Status: AC | PRN
  Administered 2021-09-25: 2 mg via INTRAVITREAL

## 2021-10-29 NOTE — Progress Notes (Signed)
[facility] Note  10/30/2021     CHIEF COMPLAINT Patient presents for Retina Follow Up   HISTORY OF PRESENT ILLNESS: [name] is a 51 y.o. female who presents to the clinic today for:  HPI     Retina Follow Up   Patient presents with  Other.  In right eye.  This started 5 weeks ago.  I, the attending physician,  performed the HPI with the patient and updated documentation appropriately.        Comments   Patient here for 5 week retina follow up for CSCR OD. Patient states vision doing great. No eye pain.       Last edited by [name], MD on 10/30/2021  1:55 PM.      Referring physician: No referring provider defined for this encounter.  HISTORICAL INFORMATION:   Selected notes from the MEDICAL RECORD NUMBER Referred by [name] for concern of sub-macular fluid / retinoschisis   CURRENT MEDICATIONS: No current outpatient medications on file. (Ophthalmic Drugs)   No current facility-administered medications for this visit. (Ophthalmic Drugs)   Current Outpatient Medications (Other)  Medication Sig   eplerenone (INSPRA) 50 MG tablet Take 1 tablet by mouth once daily   eplerenone (INSPRA) 50 MG tablet Take 1 tablet (50 mg total) by mouth daily.   hydrochlorothiazide (HYDRODIURIL) 25 MG tablet TAKE 1 TABLET BY MOUTH ONCE DAILY IN THE MORNING FOR 90 DAYS   lisinopril (PRINIVIL,ZESTRIL) 20 MG tablet Take 1 tablet (20 mg total) by mouth daily.   No current facility-administered medications for this visit. (Other)   REVIEW OF SYSTEMS: ROS   Positive for: Eyes, Psychiatric Negative for: Constitutional, Gastrointestinal, Neurological, Skin, Genitourinary, Musculoskeletal, HENT, Endocrine, Cardiovascular, Respiratory, Allergic/Imm, Heme/Lymph Last edited by [name], COA on 10/30/2021  1:35 PM.     ALLERGIES No Known Allergies  PAST MEDICAL HISTORY Past Medical History:  Diagnosis Date   Cataract     Mixed form OU   CIN I (cervical intraepithelial neoplasia I)    Depression    High risk HPV infection    Hypertension    Hypertensive retinopathy    OU   Missed ab AGUST 6 2014   Retina disorder 10/22/2019   pt states taking inspra for excess fluid in retina   Sciatic nerve injury    Past Surgical History:  Procedure Laterality Date   MYOMECTOMY  2008   FAMILY HISTORY Family History  Problem Relation Age of Onset   Hypertension Mother    Diabetes Father    Diabetes Maternal Grandmother    Hypertension Maternal Grandmother    Colon cancer Neg Hx    Colon polyps Neg Hx    Esophageal cancer Neg Hx    Rectal cancer Neg Hx    Stomach cancer Neg Hx    SOCIAL HISTORY Social History   Tobacco Use   Smoking status: Never   Smokeless tobacco: Never  Vaping Use   Vaping Use: Never used  Substance Use Topics   Alcohol use: No    Alcohol/week: 0.0 standard drinks   Drug use: No       OPHTHALMIC EXAM: Base Eye Exam     Visual Acuity (Snellen - Linear)       Right Left   Dist cc 20/80 -1 20/20 -1   Dist ph cc 20/80 +2     Correction: Glasses         Tonometry (Tonopen, 1:28 PM)  Right Left   Pressure 13 12         Pupils       Dark Light Shape React APD   Right 4 3 Round Brisk None   Left 4 3 Round Brisk None         Visual Fields (Counting fingers)       Left Right    Full Full         Extraocular Movement       Right Left    Full, Ortho Full, Ortho         Neuro/Psych     Oriented x3: Yes   Mood/Affect: Normal         Dilation     Both eyes: 1.0% Mydriacyl, 2.5% Phenylephrine @ 1:28 PM           Slit Lamp and Fundus Exam     Slit Lamp Exam       Right Left   Lids/Lashes Dermatochalasis - upper lid Dermatochalasis - upper lid   Conjunctiva/Sclera Injection, nasal pterygium 1+ Injection, nasal Pinguecula   Cornea Nasal ptergyium extending 2.101mm onto nasal cornea 1+Punctate epithelial erosions   Anterior  Chamber Deep, clear, narrow temporal angle, No cell or flare deep, narrow temporal angle, 1+pigment   Iris Round and dilated Round and dilated   Lens 2+ Nuclear sclerosis, 2+ Cortical cataract 2+ Nuclear sclerosis, 2+ Cortical cataract   Anterior Vitreous Mild Vitreous syneresis, Posterior vitreous detachment, vitreous condensations Mild Vitreous syneresis         Fundus Exam       Right Left   Disc Pink and Sharp, +cupping Pink and Sharp, +cupping   C/D Ratio 0.65 0.6   Macula Flat, Blunted foveal reflex, SRF nasal macula--improved, RPE mottling, clumping and atrophy, No heme Flat, good foveal reflex, Epiretinal membrane, mild RPE mottling and clumping superiorly, No heme or edema   Vessels attenuated, Tortuous Vascular attenuation, mild Tortuousity   Periphery Attached, bullous Retinoschisis cavity from 0700-0830 -- no associated RT/RD - stable from prior, shallow schisis cavity ST, mild peripheral cystoid degeneration Attached, temporal retinoschisis cavity peripheral -- stable from prior           Refraction     Wearing Rx       Sphere Cylinder Axis   Right +1.25 +1.25 112   Left +2.25 +1.00 101           IMAGING AND PROCEDURES  Imaging and Procedures for @TODAY @  OCT, Retina - OU - Both Eyes       Right Eye Quality was good. Central Foveal Thickness: 228. Progression has been stable. Findings include no IRF, outer retinal atrophy, vitreomacular adhesion , abnormal foveal contour, no SRF, intraretinal hyper-reflective material (Stable improvement in SRF nasal macula; persistent ORA in areas of prior SRF, persistent bullous schisis caught IT periphery on widefield -- stable from prior).   Left Eye Quality was good. Central Foveal Thickness: 299. Progression has been stable. Findings include normal foveal contour, no IRF, vitreomacular adhesion , no SRF.   Notes *Images captured and stored on drive  Diagnosis / Impression:  OD: Stable improvement in SRF nasal  macula; persistent ORA in areas of prior SRF, persistent bullous schisis caught IT periphery on widefield -- stable from prior OS: NFP, no IRF/SRF centrally, +VMA  Clinical management:  See below  Abbreviations: NFP - Normal foveal profile. CME - cystoid macular edema. PED - pigment epithelial detachment. IRF - intraretinal fluid. SRF - subretinal fluid.  EZ - ellipsoid zone. ERM - epiretinal membrane. ORA - outer retinal atrophy. ORT - outer retinal tubulation. SRHM - subretinal hyper-reflective material      Intravitreal Injection, Pharmacologic Agent - OD - Right Eye       Time Out 10/30/2021. 1:44 PM. Confirmed correct patient, procedure, site, and patient consented.   Anesthesia Topical anesthesia was used. Anesthetic medications included Lidocaine 2%, Proparacaine 0.5%.   Procedure Preparation included 5% betadine to ocular surface, eyelid speculum. A (32g) needle was used.   Injection: 2 mg aflibercept 2 MG/0.05ML   Route: Intravitreal, Site: Right Eye   NDC: [id number], Lot: [id number], Expiration date: 09/09/2022, Waste: 0.05 mL   Post-op Post injection exam found visual acuity of at least counting fingers. The patient tolerated the procedure well. There were no complications. The patient received written and verbal post procedure care education. Post injection medications were not given.            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 Essential hypertension  I10     5. Hypertensive retinopathy  of both eyes  H35.033     6. Combined forms of age-related cataract of both eyes  H25.813      1,2. CSCR OD -- chronic  - pt reports decreased vision OD since December 2019  - reports significant stressors in life -- work  - denies steroid use  - FA (09.30.20) shows focal hyperfluorescent leakage points nasal macula OD -- expansile dot phenotype?  - started 50 mg po eplerenone since 09.30.20  -- pt self d/c in February, re-started in June 2021  - s/p IVA #1 (09.01.21), #2 (09.29.21), #3 (11.03.21), #4 (12.03.21) -- IVA resistance  - s/p IVE OD #1 (01.20.22), #2 (02.18.22), #3 (04.05.22-sample), #4 (05.23.22), # 5 (06.27.22), #6 (07.28.22), #7 (08.26.22), #8 (09.23.22), #9 (10.21.22), #10 (11.18.22), #11 (12.16.22)  - BCVA 20/80 from 20/100  - OCT shows stable improvement in SRF nasal macula; persistent ORA in areas of prior SRF at 5 wks, persistent bullous schisis caught IT periphery on widefield -- stable from prior  - differential includes atypical exudative ARMD  - recommend IVE OD #12 today, 01.20.23 with f/u ext to 6 weeks  - RBA of procedure discussed, questions answered  - IVA informed consent obtained and signed 09.01.21  - IVE informed consent obtained and signed, 01.20.22  - see procedure note  - recommend cont po eplerenone 50 mg daily  - [employer]yle[employer] benefits investigation initiated 12.3.21 -- approved for 2022  - continue eplerenone 50 mg daily  - f/u 6 weeks -- DFE/OCT, possible injection  3. Retinoschisis OU (OD > OS) -- stable  - bullous retinoschisis cavity IT quadrant OD-- 1093-2355 oclock  - shallow retinoschisis cavity IT quadrant OS  - no associated retinal holes/RT/RD on scleral depression   - widefield OCT shows stable bullous schisis OD  - discussed findings and prognosis   - recommend observation -- consider laser retinopexy if schisis  progresses or threatens macula or develops RD   4,5. Hypertensive retinopathy OU  - discussed importance of tight BP control  -  monitor   6. 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  - discussed diagnosis and progression  - not yet visually significant  - monitor for now  Ophthalmic Meds Ordered this visit:  Meds ordered this encounter  Medications   aflibercept (EYLEA) SOLN 2 mg     Return in about 6 weeks (around 12/11/2021) for CSCR OD w/DFE/OCT/likely IVE OD.  There are no Patient Instructions on file for this visit.  This document serves as a record of services personally performed by Gardiner Sleeper, MD, PhD. It was created on their behalf by Leonie Douglas, an ophthalmic technician. The creation of this record is the provider's dictation and/or activities during the visit.    Electronically signed by: Leonie Douglas COA, 10/30/21  1:57 PM  This document serves as a record of services personally performed by Gardiner Sleeper, MD, PhD. It was created on their behalf by Estill Bakes, COT an ophthalmic technician. The creation of this record is the provider's dictation and/or activities during the visit.    Electronically signed by: Estill Bakes, COT 1.20.23 @ 1:57 PM   Gardiner Sleeper, M.D., Ph.D. Diseases & Surgery of [facility] [place] 10/30/2021  I have reviewed the above documentation for accuracy and completeness, and I agree with the above. Gardiner Sleeper, M.D., Ph.D. 10/30/21 1:57 PM   Abbreviations: M myopia (nearsighted); A astigmatism; H hyperopia (farsighted); P presbyopia; Mrx spectacle prescription;  CTL contact lenses; OD right eye; OS left eye; OU both eyes  XT exotropia; ET esotropia; PEK punctate epithelial keratitis; PEE punctate epithelial erosions; DES dry eye syndrome; MGD meibomian gland dysfunction; ATs artificial tears; PFAT's preservative free artificial tears; [place] nuclear sclerotic cataract; PSC posterior subcapsular cataract; ERM epi-retinal  membrane; PVD posterior vitreous detachment; RD retinal detachment; DM diabetes mellitus; DR diabetic retinopathy; NPDR non-proliferative diabetic retinopathy; PDR proliferative diabetic retinopathy; CSME clinically significant macular edema; DME diabetic macular edema; dbh dot blot hemorrhages; CWS cotton wool spot; POAG primary open angle glaucoma; C/D cup-to-disc ratio; HVF humphrey visual field; GVF goldmann visual field; OCT optical coherence tomography; IOP intraocular pressure; BRVO Branch retinal vein occlusion; CRVO central retinal vein occlusion; CRAO central retinal artery occlusion; BRAO branch retinal artery occlusion; RT retinal tear; SB scleral buckle; PPV pars plana vitrectomy; VH Vitreous hemorrhage; PRP panretinal laser photocoagulation; IVK intravitreal kenalog; VMT vitreomacular traction; MH Macular hole;  NVD neovascularization of the disc; NVE neovascularization elsewhere; AREDS age related eye disease study; ARMD age related macular degeneration; POAG primary open angle glaucoma; EBMD epithelial/anterior basement membrane dystrophy; ACIOL anterior chamber intraocular lens; IOL intraocular lens; PCIOL posterior chamber intraocular lens; Phaco/IOL phacoemulsification with intraocular lens placement; [place] photorefractive keratectomy; LASIK laser assisted in situ keratomileusis; HTN hypertension; DM diabetes mellitus; COPD chronic obstructive pulmonary disease

## 2021-10-30 ENCOUNTER — Ambulatory Visit (INDEPENDENT_AMBULATORY_CARE_PROVIDER_SITE_OTHER): Payer: Commercial Managed Care - PPO | Admitting: Ophthalmology

## 2021-10-30 ENCOUNTER — Encounter (INDEPENDENT_AMBULATORY_CARE_PROVIDER_SITE_OTHER): Payer: Self-pay | Admitting: Ophthalmology

## 2021-10-30 ENCOUNTER — Other Ambulatory Visit: Payer: Self-pay

## 2021-10-30 DIAGNOSIS — I10 Essential (primary) hypertension: Secondary | ICD-10-CM | POA: Diagnosis not present

## 2021-10-30 DIAGNOSIS — H33103 Unspecified retinoschisis, bilateral: Secondary | ICD-10-CM | POA: Diagnosis not present

## 2021-10-30 DIAGNOSIS — H25813 Combined forms of age-related cataract, bilateral: Secondary | ICD-10-CM

## 2021-10-30 DIAGNOSIS — H35033 Hypertensive retinopathy, bilateral: Secondary | ICD-10-CM

## 2021-10-30 DIAGNOSIS — H353211 Exudative age-related macular degeneration, right eye, with active choroidal neovascularization: Secondary | ICD-10-CM

## 2021-10-30 DIAGNOSIS — H35711 Central serous chorioretinopathy, right eye: Secondary | ICD-10-CM | POA: Diagnosis not present

## 2021-10-30 MED ORDER — AFLIBERCEPT 2MG/0.05ML IZ SOLN FOR KALEIDOSCOPE
2.0000 mg | INTRAVITREAL | Status: AC | PRN
  Administered 2021-10-30: 2 mg via INTRAVITREAL

## 2021-11-25 ENCOUNTER — Ambulatory Visit: Payer: Commercial Managed Care - PPO | Admitting: Obstetrics & Gynecology

## 2021-12-07 NOTE — Progress Notes (Signed)
[facility] Note  12/10/2021     CHIEF COMPLAINT Patient presents for Retina Follow Up   HISTORY OF PRESENT ILLNESS: [name] is a 51 y.o. female who presents to the clinic today for:  HPI     Retina Follow Up   Patient presents with  Other.  In right eye.  Duration of 6 weeks.  Since onset it is stable.  I, the attending physician,  performed the HPI with the patient and updated documentation appropriately.        Comments   6 week follow up CSR OD- Doing well, no changes since last visit.       Last edited by [name], MD on 12/10/2021  4:10 PM.    Pt states no change in vision, she is still taking eplerenone   Referring physician: No referring provider defined for this encounter.  HISTORICAL INFORMATION:   Selected notes from the MEDICAL RECORD NUMBER Referred by [name] for concern of sub-macular fluid / retinoschisis   CURRENT MEDICATIONS: No current outpatient medications on file. (Ophthalmic Drugs)   No current facility-administered medications for this visit. (Ophthalmic Drugs)   Current Outpatient Medications (Other)  Medication Sig   eplerenone (INSPRA) 50 MG tablet Take 1 tablet by mouth once daily   hydrochlorothiazide (HYDRODIURIL) 25 MG tablet TAKE 1 TABLET BY MOUTH ONCE DAILY IN THE MORNING FOR 90 DAYS   lisinopril (PRINIVIL,ZESTRIL) 20 MG tablet Take 1 tablet (20 mg total) by mouth daily.   eplerenone (INSPRA) 50 MG tablet Take 1 tablet (50 mg total) by mouth daily.   No current facility-administered medications for this visit. (Other)   REVIEW OF SYSTEMS: ROS   Positive for: Eyes, Psychiatric Negative for: Constitutional, Gastrointestinal, Neurological, Skin, Genitourinary, Musculoskeletal, HENT, Endocrine, Cardiovascular, Respiratory, Allergic/Imm, Heme/Lymph Last edited by Leonie Douglas, COA on 12/10/2021  1:36 PM.     ALLERGIES No Known Allergies  PAST MEDICAL HISTORY Past  Medical History:  Diagnosis Date   Cataract    Mixed form OU   CIN I (cervical intraepithelial neoplasia I)    Depression    High risk HPV infection    Hypertension    Hypertensive retinopathy    OU   Missed ab AGUST 6 2014   Retina disorder 10/22/2019   pt states taking inspra for excess fluid in retina   Sciatic nerve injury    Past Surgical History:  Procedure Laterality Date   MYOMECTOMY  2008   FAMILY HISTORY Family History  Problem Relation Age of Onset   Hypertension Mother    Diabetes Father    Diabetes Maternal Grandmother    Hypertension Maternal Grandmother    Colon cancer Neg Hx    Colon polyps Neg Hx    Esophageal cancer Neg Hx    Rectal cancer Neg Hx    Stomach cancer Neg Hx    SOCIAL HISTORY Social History   Tobacco Use   Smoking status: Never   Smokeless tobacco: Never  Vaping Use   Vaping Use: Never used  Substance Use Topics   Alcohol use: No    Alcohol/week: 0.0 standard drinks   Drug use: No       OPHTHALMIC EXAM: Base Eye Exam     Visual Acuity (Snellen - Linear)       Right Left   Dist cc 20/100 +2 20/25 +2   Dist ph cc NI     Correction: Glasses  Tonometry (Tonopen, 1:42 PM)       Right Left   Pressure 13 14         Pupils       Dark Light Shape React APD   Right 5 4 Round Brisk None   Left 5 4 Round Brisk None         Visual Fields (Counting fingers)       Left Right    Full Full         Extraocular Movement       Right Left    Full Full         Neuro/Psych     Oriented x3: Yes   Mood/Affect: Normal         Dilation     Both eyes: 1.0% Mydriacyl, 2.5% Phenylephrine @ 1:42 PM           Slit Lamp and Fundus Exam     Slit Lamp Exam       Right Left   Lids/Lashes Dermatochalasis - upper lid Dermatochalasis - upper lid   Conjunctiva/Sclera Injection, nasal pterygium 1+ Injection, nasal Pinguecula   Cornea Nasal ptergyium extending 2.109mm onto nasal cornea 1+Punctate  epithelial erosions   Anterior Chamber Deep, clear, narrow temporal angle, No cell or flare deep, narrow temporal angle, 1+pigment   Iris Round and dilated Round and dilated   Lens 2+ Nuclear sclerosis, 2+ Cortical cataract 2+ Nuclear sclerosis, 2+ Cortical cataract   Anterior Vitreous Mild Vitreous syneresis, Posterior vitreous detachment, vitreous condensations Mild Vitreous syneresis         Fundus Exam       Right Left   Disc Pink and Sharp, +cupping Pink and Sharp, +cupping   C/D Ratio 0.65 0.6   Macula Flat, Blunted foveal reflex, central atrophy with mild interval increase in shallow SRF central macula, RPE mottling, clumping and atrophy, No heme Flat, good foveal reflex, Epiretinal membrane, mild RPE mottling and clumping superiorly, No heme or edema   Vessels attenuated, Tortuous Vascular attenuation, mild Tortuousity   Periphery Attached, bullous Retinoschisis cavity from 0700-0830 -- no associated RT/RD - stable from prior, shallow schisis cavity ST, mild peripheral cystoid degeneration Attached, temporal retinoschisis cavity peripheral -- stable from prior           Refraction     Wearing Rx       Sphere Cylinder Axis   Right +1.25 +1.25 112   Left +2.25 +1.00 101           IMAGING AND PROCEDURES  Imaging and Procedures for @TODAY @  OCT, Retina - OU - Both Eyes       Right Eye Quality was good. Central Foveal Thickness: 229. Progression has worsened. Findings include no IRF, outer retinal atrophy, vitreomacular adhesion , abnormal foveal contour, no SRF, intraretinal hyper-reflective material (Interval re-development of central SRF, persistent ORA in areas of prior SRF, persistent bullous schisis caught IT periphery on widefield -- stable from prior).   Left Eye Quality was good. Central Foveal Thickness: 299. Progression has been stable. Findings include normal foveal contour, no IRF, vitreomacular adhesion , no SRF.   Notes *Images captured and stored on  drive  Diagnosis / Impression:  OD: Interval re-development of central SRF, persistent ORA in areas of prior SRF, persistent bullous schisis caught IT periphery on widefield -- stable from prior OS: NFP, no IRF/SRF centrally, +VMA  Clinical management:  See below  Abbreviations: NFP - Normal foveal profile. CME - cystoid macular edema.  PED - pigment epithelial detachment. IRF - intraretinal fluid. SRF - subretinal fluid. EZ - ellipsoid zone. ERM - epiretinal membrane. ORA - outer retinal atrophy. ORT - outer retinal tubulation. SRHM - subretinal hyper-reflective material      Intravitreal Injection, Pharmacologic Agent - OD - Right Eye       Time Out 12/10/2021. 2:10 PM. Confirmed correct patient, procedure, site, and patient consented.   Anesthesia Topical anesthesia was used. Anesthetic medications included Lidocaine 2%, Proparacaine 0.5%.   Procedure Preparation included 5% betadine to ocular surface, eyelid speculum. A (32g) needle was used.   Injection: 2 mg aflibercept 2 MG/0.05ML   Route: Intravitreal, Site: Right Eye   NDC: [id number], Lot: [id number], Expiration date: 10/10/2022, Waste: 0.05 mL   Post-op Post injection exam found visual acuity of at least counting fingers. The patient tolerated the procedure well. There were no complications. The patient received written and verbal post procedure care education. Post injection medications were not given.            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 Essential hypertension  I10     5. Hypertensive retinopathy of both eyes  H35.033     6. Combined forms of age-related  cataract of both eyes  H25.813      1,2. CSCR OD -- chronic  - pt reports decreased vision OD since December 2019  - reports significant stressors in life -- work  - denies steroid use  - FA (09.30.20) shows focal hyperfluorescent leakage points nasal macula OD -- expansile dot phenotype?  - started 50 mg po eplerenone since 09.30.20  -- pt self d/c in February, re-started in June 2021  - s/p IVA #1 (09.01.21), #2 (09.29.21), #3 (11.03.21), #4 (12.03.21) -- IVA resistance  - s/p IVE OD #1 (01.20.22), #2 (02.18.22), #3 (04.05.22-sample), #4 (05.23.22), # 5 (06.27.22), #6 (07.28.22), #7 (08.26.22), #8 (09.23.22), #9 (10.21.22), #10 (11.18.22), #11 (12.16.22),!2 (01.20.23)  - BCVA decreased to 20/100 from 20/80   - OCT shows OD: Interval re-development of central SRF, persistent ORA in areas of prior SRF, persistent bullous schisis caught IT periphery on widefield -- stable from prior  - differential includes atypical exudative ARMD  - recommend IVE OD #13 today, 03.02.23 with f/u back to 5 weeks  - RBA of procedure discussed, questions answered  - IVA informed consent obtained and signed 09.01.21  - IVE informed consent obtained and signed, 01.20.22  - see procedure note  - Eylea4U benefits investigation initiated 12.3.21 -- approved for 2022  - continue po eplerenone 50 mg daily  - f/u 5 weeks -- DFE/OCT, possible injection  3. Retinoschisis OU (OD > OS) -- stable  - bullous retinoschisis cavity IT quadrant OD-- 6812-7517 oclock  - shallow retinoschisis cavity IT quadrant OS  - no associated retinal holes/RT/RD on scleral depression   - widefield OCT shows stable bullous schisis OD  - discussed findings and prognosis   - recommend observation -- consider laser retinopexy if schisis progresses or threatens macula or develops RD   4,5. Hypertensive retinopathy OU  - discussed importance of tight BP control  - monitor  6. Mixed form age related cataracts OU  - The symptoms of  cataract, surgical options, and treatments and risks were discussed with patient.  - discussed diagnosis and progression  - not yet visually significant  - monitor for now  Ophthalmic Meds Ordered this visit:  Meds ordered this encounter  Medications   aflibercept (EYLEA) SOLN 2 mg     Return in about 5 weeks (around 01/14/2022) for f/u exu ARMD OD, DFE, OCT.  There are no Patient Instructions on file for this visit.  This document serves as a record of services personally performed by Gardiner Sleeper, MD, PhD. It was created on their behalf by Leonie Douglas, an ophthalmic technician. The creation of this record is the provider's dictation and/or activities during the visit.    This document serves as a record of services personally performed by Gardiner Sleeper, MD, PhD. It was created on their behalf by San Jetty. Owens Shark, OA an ophthalmic technician. The creation of this record is the provider's dictation and/or activities during the visit.    Electronically signed by: San Jetty. Owens Shark, [place] 02.27.2023 4:15 PM   Gardiner Sleeper, M.D., Ph.D. Diseases & Surgery of the Retina and Vitreous [facility]  I have reviewed the above documentation for accuracy and completeness, and I agree with the above. Gardiner Sleeper, M.D., Ph.D. 12/10/21 4:15 PM   Abbreviations: M myopia (nearsighted); A astigmatism; H hyperopia (farsighted); P presbyopia; Mrx spectacle prescription;  CTL contact lenses; OD right eye; OS left eye; OU both eyes  XT exotropia; ET esotropia; PEK punctate epithelial keratitis; PEE punctate epithelial erosions; DES dry eye syndrome; MGD meibomian gland dysfunction; ATs artificial tears; PFAT's preservative free artificial tears; [place] nuclear sclerotic cataract; PSC posterior subcapsular cataract; ERM epi-retinal membrane; PVD posterior vitreous detachment; RD retinal detachment; DM diabetes mellitus; DR diabetic retinopathy; NPDR non-proliferative diabetic  retinopathy; PDR proliferative diabetic retinopathy; CSME clinically significant macular edema; DME diabetic macular edema; dbh dot blot hemorrhages; CWS cotton wool spot; POAG primary open angle glaucoma; C/D cup-to-disc ratio; HVF humphrey visual field; GVF goldmann visual field; OCT optical coherence tomography; IOP intraocular pressure; BRVO Branch retinal vein occlusion; CRVO central retinal vein occlusion; CRAO central retinal artery occlusion; BRAO branch retinal artery occlusion; RT retinal tear; SB scleral buckle; PPV pars plana vitrectomy; VH Vitreous hemorrhage; PRP panretinal laser photocoagulation; IVK intravitreal kenalog; VMT vitreomacular traction; MH Macular hole;  NVD neovascularization of the disc; NVE neovascularization elsewhere; AREDS age related eye disease study; ARMD age related macular degeneration; POAG primary open angle glaucoma; EBMD epithelial/anterior basement membrane dystrophy; ACIOL anterior chamber intraocular lens; IOL intraocular lens; PCIOL posterior chamber intraocular lens; Phaco/IOL phacoemulsification with intraocular lens placement; Kappa photorefractive keratectomy; LASIK laser assisted in situ keratomileusis; HTN hypertension; DM diabetes mellitus; COPD chronic obstructive pulmonary disease

## 2021-12-10 ENCOUNTER — Encounter (INDEPENDENT_AMBULATORY_CARE_PROVIDER_SITE_OTHER): Payer: Self-pay | Admitting: Ophthalmology

## 2021-12-10 ENCOUNTER — Ambulatory Visit (INDEPENDENT_AMBULATORY_CARE_PROVIDER_SITE_OTHER): Payer: Commercial Managed Care - PPO | Admitting: Ophthalmology

## 2021-12-10 ENCOUNTER — Other Ambulatory Visit: Payer: Self-pay

## 2021-12-10 DIAGNOSIS — H33103 Unspecified retinoschisis, bilateral: Secondary | ICD-10-CM | POA: Diagnosis not present

## 2021-12-10 DIAGNOSIS — H35711 Central serous chorioretinopathy, right eye: Secondary | ICD-10-CM

## 2021-12-10 DIAGNOSIS — I10 Essential (primary) hypertension: Secondary | ICD-10-CM

## 2021-12-10 DIAGNOSIS — H353211 Exudative age-related macular degeneration, right eye, with active choroidal neovascularization: Secondary | ICD-10-CM

## 2021-12-10 DIAGNOSIS — H35033 Hypertensive retinopathy, bilateral: Secondary | ICD-10-CM

## 2021-12-10 DIAGNOSIS — H25813 Combined forms of age-related cataract, bilateral: Secondary | ICD-10-CM

## 2021-12-10 MED ORDER — AFLIBERCEPT 2MG/0.05ML IZ SOLN FOR KALEIDOSCOPE
2.0000 mg | INTRAVITREAL | Status: AC | PRN
  Administered 2021-12-10: 2 mg via INTRAVITREAL

## 2021-12-11 ENCOUNTER — Encounter (INDEPENDENT_AMBULATORY_CARE_PROVIDER_SITE_OTHER): Payer: Commercial Managed Care - PPO | Admitting: Ophthalmology

## 2021-12-22 ENCOUNTER — Other Ambulatory Visit: Payer: Self-pay | Admitting: Obstetrics & Gynecology

## 2021-12-22 DIAGNOSIS — Z1231 Encounter for screening mammogram for malignant neoplasm of breast: Secondary | ICD-10-CM

## 2021-12-28 ENCOUNTER — Ambulatory Visit (INDEPENDENT_AMBULATORY_CARE_PROVIDER_SITE_OTHER): Payer: Commercial Managed Care - PPO | Admitting: Obstetrics & Gynecology

## 2021-12-28 ENCOUNTER — Other Ambulatory Visit: Payer: Self-pay

## 2021-12-28 ENCOUNTER — Encounter: Payer: Self-pay | Admitting: Obstetrics & Gynecology

## 2021-12-28 ENCOUNTER — Other Ambulatory Visit (HOSPITAL_COMMUNITY)
Admission: RE | Admit: 2021-12-28 | Discharge: 2021-12-28 | Disposition: A | Discharge status: Home / Self Care | Payer: Commercial Managed Care - PPO | Source: Ambulatory Visit | Attending: Obstetrics & Gynecology | Admitting: Obstetrics & Gynecology

## 2021-12-28 VITALS — BP 112/78 | HR 80 | Ht 64.0 in | Wt 158.0 lb

## 2021-12-28 DIAGNOSIS — Z01419 Encounter for gynecological examination (general) (routine) without abnormal findings: Secondary | ICD-10-CM | POA: Diagnosis present

## 2021-12-28 DIAGNOSIS — N870 Mild cervical dysplasia: Secondary | ICD-10-CM | POA: Diagnosis present

## 2021-12-28 DIAGNOSIS — Z789 Other specified health status: Secondary | ICD-10-CM

## 2021-12-28 NOTE — Progress Notes (Signed)
? ? ?[name] 1971/01/18 [id number] ? ? ?History:    51 y.o.  G1P0A1 Boyfriend x 6 yrs ?  ?RP:  Established patient presenting for annual gyn exam with repeat Pap test ?  ?HPI: Menses were regular normal every month until the LMP 09/2020.  Started having hot flushes.  No pelvic pain.  Using condoms. No pain with IC except feels a little dry.  Pap 11/2020 LGSIL.  In 2021, HPV HR Pos. HPV 16-18-45 Neg.  Colpo 01/2021 Mild Dysplasia/CIN 1.  Breasts normal.  Mammo 01/2021 Neg.  BMI 27.12.  Planning to walk everyday. Colono 10/2019 2 Polyps removed, repeat Colono at 1 yrs.  Health labs with Fam MD.   ? ? ?Past medical history,surgical history, family history and social history were all reviewed and documented in the EPIC chart. ? ?Gynecologic History ?Patient's last menstrual period was 07/07/2021 (exact date). ? ?Obstetric History ?OB History  ?Gravida Para Term Preterm AB Living  ?1 0     1 0  ?SAB IAB Ectopic Multiple Live Births  ?1          ?  ?# Outcome Date GA Lbr Len/2nd Weight Sex Delivery Anes PTL Lv  ?1 SAB           ? ? ? ?ROS: A ROS was performed and pertinent positives and negatives are included in the history. ? GENERAL: No fevers or chills. HEENT: No change in vision, no earache, sore throat or sinus congestion. NECK: No pain or stiffness. CARDIOVASCULAR: No chest pain or pressure. No palpitations. PULMONARY: No shortness of breath, cough or wheeze. GASTROINTESTINAL: No abdominal pain, nausea, vomiting or diarrhea, melena or bright red blood per rectum. GENITOURINARY: No urinary frequency, urgency, hesitancy or dysuria. MUSCULOSKELETAL: No joint or muscle pain, no back pain, no recent trauma. DERMATOLOGIC: No rash, no itching, no lesions. ENDOCRINE: No polyuria, polydipsia, no heat or cold intolerance. No recent change in weight. HEMATOLOGICAL: No anemia or easy bruising or bleeding. NEUROLOGIC: No headache, seizures, numbness, tingling or weakness. PSYCHIATRIC: No depression, no loss of  interest in normal activity or change in sleep pattern.  ?  ? ?Exam: ? ? ?BP 112/78   Pulse 80   Ht '5\' 4"'$  (1.626 m)   Wt 158 lb (71.7 kg)   LMP 07/07/2021 (Exact Date)   SpO2 99%   BMI 27.12 kg/m?  ? ?Body mass index is 27.12 kg/m?. ? ?General appearance : Well developed well nourished female. No acute distress ?HEENT: Eyes: no retinal hemorrhage or exudates,  Neck supple, trachea midline, no carotid bruits, no thyroidmegaly ?Lungs: Clear to auscultation, no rhonchi or wheezes, or rib retractions  ?Heart: Regular rate and rhythm, no murmurs or gallops ?Breast:Examined in sitting and supine position were symmetrical in appearance, no palpable masses or tenderness,  no skin retraction, no nipple inversion, no nipple discharge, no skin discoloration, no axillary or supraclavicular lymphadenopathy ?Abdomen: no palpable masses or tenderness, no rebound or guarding ?Extremities: no edema or skin discoloration or tenderness ? ?Pelvic: Vulva: Normal ?            Vagina: No gross lesions or discharge ? Cervix: No gross lesions or discharge ? Uterus  AV, normal size, shape and consistency, non-tender and mobile ? Adnexa  Without masses or tenderness ? Anus: Normal ? ? ?Assessment/Plan:  51 y.o. female for annual exam  ? ?1. Encounter for gynecological examination with Papanicolaou smear of cervix ?Menses were regular normal every month until the LMP 09/2020.  Started having hot flushes.  No pelvic pain.  Using condoms. No pain with IC except feels a little dry.  Pap 11/2020 LGSIL.  In 2021, HPV HR Pos. HPV 16-18-45 Neg.  Colpo 01/2021 Mild Dysplasia/CIN 1.  Breasts normal.  Mammo 01/2021 Neg.  BMI 27.12.  Planning to walk everyday. Colono 10/2019 2 Polyps removed, repeat Colono at 1 yrs.  Health labs with Fam MD.   ?- Cytology - PAP( [facility]) ? ?2. Dysplasia of cervix, low grade (CIN 1) ?- Cytology - PAP( ) ? ?3. Use of condoms for contraception  ? ?Princess Bruins MD, 2:25 PM 12/28/2021 ? ?  ?

## 2021-12-30 LAB — CYTOLOGY - PAP
Comment: NEGATIVE
High risk HPV: POSITIVE — AB

## 2022-01-05 ENCOUNTER — Other Ambulatory Visit: Payer: Self-pay

## 2022-01-05 DIAGNOSIS — R87810 Cervical high risk human papillomavirus (HPV) DNA test positive: Secondary | ICD-10-CM

## 2022-01-05 DIAGNOSIS — R87612 Low grade squamous intraepithelial lesion on cytologic smear of cervix (LGSIL): Secondary | ICD-10-CM

## 2022-01-13 NOTE — Progress Notes (Addendum)
?[facility] Note ? ?01/15/2022 ? ?  ? ?CHIEF COMPLAINT ?Patient presents for Retina Follow Up ? ? ?HISTORY OF PRESENT ILLNESS: ?[name] is a 51 y.o. female who presents to the clinic today for:  ?HPI   ? ? Retina Follow Up   ?Patient presents with  Other.  In right eye.  This started 5 weeks ago.  I, the attending physician,  performed the HPI with the patient and updated documentation appropriately. ? ?  ?  ? ? Comments   ?Patient here for 5 weeks retina follow up for CSCR OD. Patient states vision doing good. No eye pain.  ? ?  ?  ?Last edited by [name], MD on 01/16/2022  2:58 PM.  ?  ? ?Pt feels like eye is doing well ? ?Referring physician: ?No referring provider defined for this encounter. ? ?HISTORICAL INFORMATION:  ? ?Selected notes from the [facility] ?Referred by [name] for concern of sub-macular fluid / retinoschisis  ? ?CURRENT MEDICATIONS: ?No current outpatient medications on file. (Ophthalmic Drugs)  ? ?No current facility-administered medications for this visit. (Ophthalmic Drugs)  ? ?Current Outpatient Medications (Other)  ?Medication Sig  ? eplerenone (INSPRA) 50 MG tablet Take 1 tablet by mouth once daily  ? hydrochlorothiazide (HYDRODIURIL) 25 MG tablet TAKE 1 TABLET BY MOUTH ONCE DAILY IN THE MORNING FOR 90 DAYS  ? lisinopril (PRINIVIL,ZESTRIL) 20 MG tablet Take 1 tablet (20 mg total) by mouth daily.  ? ?No current facility-administered medications for this visit. (Other)  ? ?REVIEW OF SYSTEMS: ?ROS   ?Positive for: Eyes, Psychiatric ?Negative for: Constitutional, Gastrointestinal, Neurological, Skin, Genitourinary, Musculoskeletal, HENT, Endocrine, Cardiovascular, Respiratory, Allergic/Imm, Heme/Lymph ?Last edited by [name], COA on 01/15/2022  2:10 PM.  ?  ? ?ALLERGIES ?Allergies  ?Allergen Reactions  ? Pollen Extract Itching  ? ?PAST MEDICAL HISTORY ?Past Medical History:  ?Diagnosis Date  ? Cataract   ? Mixed  form OU  ? CIN I (cervical intraepithelial neoplasia I)   ? Depression   ? High risk HPV infection   ? Hypertension   ? Hypertensive retinopathy   ? OU  ? Missed ab AGUST 6 2014  ? Retina disorder 10/22/2019  ? pt states taking inspra for excess fluid in retina  ? Sciatic nerve injury   ? ?Past Surgical History:  ?Procedure Laterality Date  ? MYOMECTOMY  2008  ? ?FAMILY HISTORY ?Family History  ?Problem Relation Age of Onset  ? Hypertension Mother   ? Diabetes Father   ? Diabetes Maternal Grandmother   ? Hypertension Maternal Grandmother   ? Colon cancer Neg Hx   ? Colon polyps Neg Hx   ? Esophageal cancer Neg Hx   ? Rectal cancer Neg Hx   ? Stomach cancer Neg Hx   ? ?SOCIAL HISTORY ?Social History  ? ?Tobacco Use  ? Smoking status: Never  ? Smokeless tobacco: Never  ?Vaping Use  ? Vaping Use: Never used  ?Substance Use Topics  ? Alcohol use: No  ?  Alcohol/week: 0.0 standard drinks  ? Drug use: No  ?  ? ?  ?OPHTHALMIC EXAM: ?Base Eye Exam   ? ? Visual Acuity (Snellen - Linear)   ? ?   Right Left  ? Dist cc 20/100 -2 20/25 -2  ? Dist ph cc 20/100 +2 20/25 +2  ? ? Correction: Glasses  ? ?  ?  ? ? Tonometry (Tonopen, 2:08 PM)   ? ?  Right Left  ? Pressure 13 11  ? ?  ?  ? ? Pupils   ? ?   Dark Light Shape React APD  ? Right 5 4 Round Brisk None  ? Left 5 4 Round Brisk None  ? ?  ?  ? ? Visual Fields (Counting fingers)   ? ?   Left Right  ?  Full Full  ? ?  ?  ? ? Extraocular Movement   ? ?   Right Left  ?  Full, Ortho Full, Ortho  ? ?  ?  ? ? Neuro/Psych   ? ? Oriented x3: Yes  ? Mood/Affect: Normal  ? ?  ?  ? ? Dilation   ? ? Both eyes: 1.0% Mydriacyl, 2.5% Phenylephrine @ 2:08 PM  ? ?  ?  ? ?  ? ?Slit Lamp and Fundus Exam   ? ? Slit Lamp Exam   ? ?   Right Left  ? Lids/Lashes Dermatochalasis - upper lid Dermatochalasis - upper lid  ? Conjunctiva/Sclera Injection, nasal pterygium 1+ Injection, nasal Pinguecula  ? Cornea Nasal ptergyium extending 2.33m onto nasal cornea 1+Punctate epithelial erosions  ? Anterior  Chamber Deep, clear, narrow temporal angle, No cell or flare deep, narrow temporal angle, 1+pigment  ? Iris Round and dilated Round and dilated  ? Lens 2+ Nuclear sclerosis, 2+ Cortical cataract 2+ Nuclear sclerosis, 2+ Cortical cataract  ? Anterior Vitreous Mild Vitreous syneresis, Posterior vitreous detachment, vitreous condensations Mild Vitreous syneresis  ? ?  ?  ? ? Fundus Exam   ? ?   Right Left  ? Disc Pink and Sharp, +cupping Pink and Sharp, +cupping  ? C/D Ratio 0.65 0.6  ? Macula Flat, Blunted foveal reflex, central atrophy with mild interval improvement in shallow SRF central macula, RPE mottling, clumping and atrophy, No heme Flat, good foveal reflex, Epiretinal membrane, mild RPE mottling and clumping superiorly, No heme or edema  ? Vessels attenuated, Tortuous Vascular attenuation, mild Tortuousity  ? Periphery Attached, bullous Retinoschisis cavity from 0700-0830 -- no associated RT/RD - stable from prior, shallow schisis cavity ST, mild peripheral cystoid degeneration Attached, temporal retinoschisis cavity peripheral -- stable from prior  ? ?  ?  ? ?  ? ?Refraction   ? ? Wearing Rx   ? ?   Sphere Cylinder Axis  ? Right +1.25 +1.25 112  ? Left +2.25 +1.00 101  ? ?  ?  ? ?  ? ?IMAGING AND PROCEDURES  ?Imaging and Procedures for '@TODAY'$ @ ? ?OCT, Retina - OU - Both Eyes   ? ?   ?Right Eye ?Quality was good. Central Foveal Thickness: 208. Progression has improved. Findings include no IRF, outer retinal atrophy, vitreomacular adhesion , no SRF, intraretinal hyper-reflective material, normal foveal contour (Interval improvement in SRF, persistent ORA in areas of prior SRF, persistent bullous schisis caught IT periphery on widefield -- stable from prior).  ? ?Left Eye ?Quality was good. Central Foveal Thickness: 299. Progression has been stable. Findings include normal foveal contour, no IRF, vitreomacular adhesion , no SRF (Shallow schisis IT quad).  ? ?Notes ?*Images captured and stored on  drive ? ?Diagnosis / Impression:  ?OD: Interval improvement in SRF, persistent ORA in areas of prior SRF, persistent bullous schisis IT periphery caught on widefield -- stable from prior ?OS: NFP, no IRF/SRF centrally, +VMA ? ?Clinical management:  ?See below ? ?Abbreviations: NFP - Normal foveal profile. CME - cystoid macular edema. PED - pigment epithelial detachment. IRF -  intraretinal fluid. SRF - subretinal fluid. EZ - ellipsoid zone. ERM - epiretinal membrane. ORA - outer retinal atrophy. ORT - outer retinal tubulation. SRHM - subretinal hyper-reflective material ? ? ?  ? ?Intravitreal Injection, Pharmacologic Agent - OD - Right Eye   ? ?   ?Time Out ?01/15/2022. 3:13 PM. Confirmed correct patient, procedure, site, and patient consented.  ? ?Anesthesia ?Topical anesthesia was used. Anesthetic medications included Lidocaine 2%, Proparacaine 0.5%.  ? ?Procedure ?Preparation included 5% betadine to ocular surface, eyelid speculum. A (32g) needle was used.  ? ?Injection: ?2 mg aflibercept 2 MG/0.05ML ?  Route: Intravitreal, Site: Right Eye ?  [place]: [id number], Lot: [id number], Expiration date: 12/08/2022, Waste: 0.05 mL  ? ?Post-op ?Post injection exam found visual acuity of at least counting fingers. The patient tolerated the procedure well. There were no complications. The patient received written and verbal post procedure care education. Post injection medications were not given.  ? ?  ?  ?  ? ?  ?ASSESSMENT/PLAN: ? ?  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  aflibercept (EYLEA) SOLN 2 mg  ?  ?2. Exudative age-related macular degeneration of right eye with active choroidal neovascularization ([place])  H35.3211 OCT, Retina - OU - Both Eyes  ?  Intravitreal Injection, Pharmacologic Agent - OD - Right Eye  ?  aflibercept (EYLEA) SOLN 2 mg  ?  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  ?4. Essential hypertension  I10   ?  ?5.  Hypertensive retinopathy of both eyes  H35.033   ?  ?6. Combined forms of age-related cataract of both eyes  H25.813   ?  ? ?1,2. CSCR OD -- chronic ? - pt reports decreased vision OD since December 2019 ? - reports signi

## 2022-01-14 ENCOUNTER — Ambulatory Visit
Admission: RE | Admit: 2022-01-14 | Discharge: 2022-01-14 | Disposition: A | Discharge status: Home / Self Care | Payer: Commercial Managed Care - PPO | Source: Ambulatory Visit | Attending: Obstetrics & Gynecology | Admitting: Obstetrics & Gynecology

## 2022-01-14 DIAGNOSIS — Z1231 Encounter for screening mammogram for malignant neoplasm of breast: Secondary | ICD-10-CM

## 2022-01-15 ENCOUNTER — Ambulatory Visit (INDEPENDENT_AMBULATORY_CARE_PROVIDER_SITE_OTHER): Payer: Commercial Managed Care - PPO | Admitting: Ophthalmology

## 2022-01-15 ENCOUNTER — Encounter (INDEPENDENT_AMBULATORY_CARE_PROVIDER_SITE_OTHER): Payer: Self-pay | Admitting: Ophthalmology

## 2022-01-15 DIAGNOSIS — H353211 Exudative age-related macular degeneration, right eye, with active choroidal neovascularization: Secondary | ICD-10-CM

## 2022-01-15 DIAGNOSIS — H33103 Unspecified retinoschisis, bilateral: Secondary | ICD-10-CM

## 2022-01-15 DIAGNOSIS — H25813 Combined forms of age-related cataract, bilateral: Secondary | ICD-10-CM

## 2022-01-15 DIAGNOSIS — I10 Essential (primary) hypertension: Secondary | ICD-10-CM | POA: Diagnosis not present

## 2022-01-15 DIAGNOSIS — H35711 Central serous chorioretinopathy, right eye: Secondary | ICD-10-CM | POA: Diagnosis not present

## 2022-01-15 DIAGNOSIS — H35033 Hypertensive retinopathy, bilateral: Secondary | ICD-10-CM | POA: Diagnosis not present

## 2022-01-16 ENCOUNTER — Encounter (INDEPENDENT_AMBULATORY_CARE_PROVIDER_SITE_OTHER): Payer: Self-pay | Admitting: Ophthalmology

## 2022-01-16 MED ORDER — AFLIBERCEPT 2MG/0.05ML IZ SOLN FOR KALEIDOSCOPE
2.0000 mg | INTRAVITREAL | Status: AC | PRN
  Administered 2022-01-16: 2 mg via INTRAVITREAL

## 2022-02-02 ENCOUNTER — Other Ambulatory Visit (HOSPITAL_COMMUNITY)
Admission: RE | Admit: 2022-02-02 | Discharge: 2022-02-02 | Disposition: A | Discharge status: Home / Self Care | Payer: Commercial Managed Care - PPO | Source: Ambulatory Visit | Attending: Obstetrics & Gynecology | Admitting: Obstetrics & Gynecology

## 2022-02-02 ENCOUNTER — Ambulatory Visit: Payer: Commercial Managed Care - PPO | Admitting: Obstetrics & Gynecology

## 2022-02-02 ENCOUNTER — Encounter: Payer: Self-pay | Admitting: Obstetrics & Gynecology

## 2022-02-02 VITALS — BP 100/64 | HR 68 | Resp 16

## 2022-02-02 DIAGNOSIS — R87612 Low grade squamous intraepithelial lesion on cytologic smear of cervix (LGSIL): Secondary | ICD-10-CM | POA: Diagnosis present

## 2022-02-02 DIAGNOSIS — Z01812 Encounter for preprocedural laboratory examination: Secondary | ICD-10-CM

## 2022-02-02 DIAGNOSIS — R87810 Cervical high risk human papillomavirus (HPV) DNA test positive: Secondary | ICD-10-CM | POA: Insufficient documentation

## 2022-02-02 LAB — PREGNANCY, URINE: Preg Test, Ur: NEGATIVE

## 2022-02-02 NOTE — Progress Notes (Signed)
? ? ?  [name] [birth year] [id number] ? ? ?     51 y.o.  G1P0010  ? ?RP: LGSIL/HPV HR Pos on 12/28/21 for Colposcopy ? ?HPI:  LGSIL/HPV HR Pos on 12/28/21.  Last Colpo 01/2021 CIN 1.  HPV 16-18-45 not done. ? ? ?OB History  ?Gravida Para Term Preterm AB Living  ?1 0     1 0  ?SAB IAB Ectopic Multiple Live Births  ?1          ?  ?# Outcome Date GA Lbr Len/2nd Weight Sex Delivery Anes PTL Lv  ?1 SAB           ? ? ?Past medical history,surgical history, problem list, medications, allergies, family history and social history were all reviewed and documented in the EPIC chart. ? ? ?Directed ROS with pertinent positives and negatives documented in the history of present illness/assessment and plan. ? ?Exam: ? ?Vitals:  ? 02/02/22 1435  ?BP: 100/64  ?Pulse: 68  ?Resp: 16  ? ?General appearance:  Normal ? ?Colposcopy Procedure Note ?[facility] ?02/02/2022 ? ?Indications: LGSIL/HPV HR Positive for Colpo ? ?Procedure Details  ?The risks and benefits of the procedure and Written informed consent obtained. ? ?Speculum placed in vagina and excellent visualization of cervix achieved, cervix swabbed x 3 with acetic acid solution. ? ?Findings: ? ?Cervix colposcopy: Physical Exam ?Genitourinary: ? ? ? ?  ? ?Vaginal colposcopy: Normal ? ?Vulvar colposcopy: Normal ? ?Perirectal colposcopy: Normal ? ?The cervix was sprayed with Hurricane before performing the cervical biopsies. ? ?Specimens: HPV 16-18-45.  Cervical Bxs at 10 and 5 O'Clock ? ?Complications: None, hemostasis with Silver Nitrate ?Marland Kitchen ?Plan: Management per results ? ?UPT Neg ? ? ?Assessment/Plan:  51 y.o. G1P0010  ? ?1. LGSIL on Pap smear of cervix ?LGSIL/HPV HR Pos on 12/28/21.  Last Colpo 01/2021 CIN 1.  HPV 16-18-45 not done previously.  Colposcopy findings reviewed with patient.  Management per results of HPV 16-18-45 and Cervical Bxs.  Post procedure precautions. ?- Colposcopy ?- Surgical pathology( [place]/ [facility]) ? ?2. Cervical  high risk HPV (human papillomavirus) test positive ?- Colposcopy ?- Surgical pathology( [facility]/ [facility]) ?- Cervicovaginal ancillary only( [place]) ? ?3. Encounter for preprocedural laboratory examination ?UPT Negative ?- Pregnancy, urine ? ?Other orders ?- fexofenadine (ALLEGRA) 180 MG tablet; Take 180 mg by mouth daily. ?- diphenhydrAMINE HCl (BENADRYL ALLERGY PO); Take by mouth.  ? ?Princess Bruins MD, 2:49 PM 02/02/2022 ? ? ? ?  ?

## 2022-02-04 LAB — CERVICOVAGINAL ANCILLARY ONLY
Comment: NEGATIVE
Comment: NEGATIVE
HPV 16: NEGATIVE
HPV 18 / 45: NEGATIVE

## 2022-02-04 LAB — SURGICAL PATHOLOGY

## 2022-02-09 NOTE — Progress Notes (Signed)
?[facility] Note ? ?02/19/2022 ? ?  ? ?CHIEF COMPLAINT ?Patient presents for Retina Follow Up ? ? ?HISTORY OF PRESENT ILLNESS: ?[name] is a 51 y.o. female who presents to the clinic today for:  ?HPI   ? ? Retina Follow Up   ?Patient presents with  Wet AMD.  In right eye.  Since onset it is stable.  I, the attending physician,  performed the HPI with the patient and updated documentation appropriately. ? ?  ?  ? ? Comments   ?5 week follow up CSCR OD-  No changes since last visit.  ? ?  ?  ?Last edited by [name], MD on 02/20/2022 12:44 AM.  ?  ? ? ?Referring physician: ?No referring provider defined for this encounter. ? ?HISTORICAL INFORMATION:  ? ?Selected notes from the [facility] ?Referred by [name] for concern of sub-macular fluid / retinoschisis  ? ?CURRENT MEDICATIONS: ?No current outpatient medications on file. (Ophthalmic Drugs)  ? ?No current facility-administered medications for this visit. (Ophthalmic Drugs)  ? ?Current Outpatient Medications (Other)  ?Medication Sig  ? diphenhydrAMINE HCl (BENADRYL ALLERGY PO) Take by mouth.  ? eplerenone (INSPRA) 50 MG tablet Take 1 tablet by mouth once daily  ? fexofenadine (ALLEGRA) 180 MG tablet Take 180 mg by mouth daily.  ? hydrochlorothiazide (HYDRODIURIL) 25 MG tablet TAKE 1 TABLET BY MOUTH ONCE DAILY IN THE MORNING FOR 90 DAYS  ? lisinopril (PRINIVIL,ZESTRIL) 20 MG tablet Take 1 tablet (20 mg total) by mouth daily.  ? ?No current facility-administered medications for this visit. (Other)  ? ?REVIEW OF SYSTEMS: ?ROS   ?Positive for: Eyes, Psychiatric ?Negative for: Constitutional, Gastrointestinal, Neurological, Skin, Genitourinary, Musculoskeletal, HENT, Endocrine, Cardiovascular, Respiratory, Allergic/Imm, Heme/Lymph ?Last edited by Leonie Douglas, [place] on 02/19/2022  1:33 PM.  ?  ? ? ?ALLERGIES ?Allergies  ?Allergen Reactions  ? Pollen Extract Itching  ? ?PAST MEDICAL HISTORY ?Past  Medical History:  ?Diagnosis Date  ? Abnormal Pap smear of cervix   ? 12-28-21 lgsil HPV HR +  ? Cataract   ? Mixed form OU  ? CIN I (cervical intraepithelial neoplasia I)   ? Depression   ? High risk HPV infection   ? Hypertension   ? Hypertensive retinopathy   ? OU  ? Missed ab AGUST 6 2014  ? Retina disorder 10/22/2019  ? pt states taking inspra for excess fluid in retina  ? Sciatic nerve injury   ? ?Past Surgical History:  ?Procedure Laterality Date  ? MYOMECTOMY  2008  ? ?FAMILY HISTORY ?Family History  ?Problem Relation Age of Onset  ? Hypertension Mother   ? Diabetes Father   ? Diabetes Maternal Grandmother   ? Hypertension Maternal Grandmother   ? Colon cancer Neg Hx   ? Colon polyps Neg Hx   ? Esophageal cancer Neg Hx   ? Rectal cancer Neg Hx   ? Stomach cancer Neg Hx   ? ?SOCIAL HISTORY ?Social History  ? ?Tobacco Use  ? Smoking status: Never  ? Smokeless tobacco: Never  ?Vaping Use  ? Vaping Use: Never used  ?Substance Use Topics  ? Alcohol use: No  ?  Alcohol/week: 0.0 standard drinks  ? Drug use: No  ?  ? ?  ?OPHTHALMIC EXAM: ?Base Eye Exam   ? ? Visual Acuity (Snellen - Linear)   ? ?   Right Left  ? Dist cc 20/100 20/25  ? Dist ph cc  NI NI  ? ? Correction: Glasses  ? ?  ?  ? ? Tonometry (Tonopen, 1:38 PM)   ? ?   Right Left  ? Pressure 14 13  ? ?  ?  ? ? Pupils   ? ?   Dark Light Shape React APD  ? Right 5 4 Round Brisk None  ? Left 5 4 Round Brisk None  ? ?  ?  ? ? Visual Fields (Counting fingers)   ? ?   Left Right  ?  Full Full  ? ?  ?  ? ? Extraocular Movement   ? ?   Right Left  ?  Full Full  ? ?  ?  ? ? Neuro/Psych   ? ? Oriented x3: Yes  ? Mood/Affect: Normal  ? ?  ?  ? ? Dilation   ? ? Both eyes: 1.0% Mydriacyl, 2.5% Phenylephrine @ 1:38 PM  ? ?  ?  ? ?  ? ?Slit Lamp and Fundus Exam   ? ? Slit Lamp Exam   ? ?   Right Left  ? Lids/Lashes Dermatochalasis - upper lid Dermatochalasis - upper lid  ? Conjunctiva/Sclera Injection, nasal pterygium 1+ Injection, nasal Pinguecula  ? Cornea Nasal ptergyium  extending 2.44m onto nasal cornea 1+Punctate epithelial erosions  ? Anterior Chamber Deep, clear, narrow temporal angle, No cell or flare deep, narrow temporal angle, 1+pigment  ? Iris Round and dilated Round and dilated  ? Lens 2+ Nuclear sclerosis, 2+ Cortical cataract 2+ Nuclear sclerosis, 2+ Cortical cataract  ? Anterior Vitreous Mild Vitreous syneresis, Posterior vitreous detachment, vitreous condensations Mild Vitreous syneresis  ? ?  ?  ? ? Fundus Exam   ? ?   Right Left  ? Disc Pink and Sharp, +cupping Pink and Sharp, +cupping  ? C/D Ratio 0.65 0.5  ? Macula Flat, Blunted foveal reflex, central atrophy with mild interval increase in shallow SRF central macula, RPE mottling, clumping and atrophy, No heme Flat, good foveal reflex, Epiretinal membrane, mild RPE mottling and clumping superiorly, No heme or edema  ? Vessels attenuated, Tortuous Vascular attenuation, mild Tortuousity  ? Periphery Attached, bullous Retinoschisis cavity from 0700-0830 -- no associated RT/RD - stable from prior, shallow schisis cavity ST, mild peripheral cystoid degeneration Attached, temporal retinoschisis cavity peripheral -- stable from prior  ? ?  ?  ? ?  ? ?Refraction   ? ? Wearing Rx   ? ?   Sphere Cylinder Axis  ? Right +1.25 +1.25 112  ? Left +2.25 +1.00 101  ? ?  ?  ? ?  ? ?IMAGING AND PROCEDURES  ?Imaging and Procedures for '@TODAY'$ @ ? ?OCT, Retina - OU - Both Eyes   ? ?   ?Right Eye ?Quality was good. Central Foveal Thickness: 227. Progression has worsened. Findings include no IRF, outer retinal atrophy, vitreomacular adhesion , intraretinal hyper-reflective material, normal foveal contour, subretinal fluid (Mild interval increase in SRF, persistent ORA in areas of prior SRF, persistent bullous schisis caught IT periphery on widefield -- stable from prior).  ? ?Left Eye ?Quality was good. Central Foveal Thickness: 302. Progression has been stable. Findings include normal foveal contour, no IRF, vitreomacular adhesion , no  SRF (Shallow schisis IT quad).  ? ?Notes ?*Images captured and stored on drive ? ?Diagnosis / Impression:  ?OD: Mild interval increase in SRF, persistent ORA in areas of prior SRF, persistent bullous schisis caught IT periphery on widefield -- stable from prior ?OS: NFP, no IRF/SRF centrally, +  VMA ? ?Clinical management:  ?See below ? ?Abbreviations: NFP - Normal foveal profile. CME - cystoid macular edema. PED - pigment epithelial detachment. IRF - intraretinal fluid. SRF - subretinal fluid. EZ - ellipsoid zone. ERM - epiretinal membrane. ORA - outer retinal atrophy. ORT - outer retinal tubulation. SRHM - subretinal hyper-reflective material ? ? ?  ? ?Intravitreal Injection, Pharmacologic Agent - OD - Right Eye   ? ?   ?Time Out ?02/19/2022. 3:30 PM. Confirmed correct patient, procedure, site, and patient consented.  ? ?Anesthesia ?Topical anesthesia was used. Anesthetic medications included Lidocaine 2%, Proparacaine 0.5%.  ? ?Procedure ?Preparation included 5% betadine to ocular surface, eyelid speculum. A (32g) needle was used.  ? ?Injection: ?2 mg aflibercept 2 MG/0.05ML ?  Route: Intravitreal, Site: Right Eye ?  [place]: [id number], Lot: [id number], Expiration date: 01/09/2023, Waste: 0 mL  ? ?Post-op ?Post injection exam found visual acuity of at least counting fingers. The patient tolerated the procedure well. There were no complications. The patient received written and verbal post procedure care education. Post injection medications were not given.  ? ?  ?  ?  ? ?  ?ASSESSMENT/PLAN: ? ?  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  ?2. Exudative age-related macular degeneration of right eye with active choroidal neovascularization ([facility])  H35.3211 OCT, Retina - OU - Both Eyes  ?  Intravitreal Injection, Pharmacologic Agent - OD - Right Eye  ?  aflibercept (EYLEA) SOLN 2 mg  ?  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  ?4. Essential hypertension  I10   ?  ?5. Hypertensive  retinopathy of both eyes  H35.033   ?  ?6. Combined forms of age-related cataract of both eyes  H25.813   ?  ? ?1,2. CSCR OD -- chronic ? - pt reports decreased vision OD since December 2019 ? - reports significant

## 2022-02-19 ENCOUNTER — Encounter (INDEPENDENT_AMBULATORY_CARE_PROVIDER_SITE_OTHER): Payer: Self-pay | Admitting: Ophthalmology

## 2022-02-19 ENCOUNTER — Ambulatory Visit (INDEPENDENT_AMBULATORY_CARE_PROVIDER_SITE_OTHER): Payer: Commercial Managed Care - PPO | Admitting: Ophthalmology

## 2022-02-19 DIAGNOSIS — H35033 Hypertensive retinopathy, bilateral: Secondary | ICD-10-CM | POA: Diagnosis not present

## 2022-02-19 DIAGNOSIS — H353211 Exudative age-related macular degeneration, right eye, with active choroidal neovascularization: Secondary | ICD-10-CM

## 2022-02-19 DIAGNOSIS — I10 Essential (primary) hypertension: Secondary | ICD-10-CM | POA: Diagnosis not present

## 2022-02-19 DIAGNOSIS — H33103 Unspecified retinoschisis, bilateral: Secondary | ICD-10-CM | POA: Diagnosis not present

## 2022-02-19 DIAGNOSIS — H35711 Central serous chorioretinopathy, right eye: Secondary | ICD-10-CM

## 2022-02-19 DIAGNOSIS — H25813 Combined forms of age-related cataract, bilateral: Secondary | ICD-10-CM

## 2022-02-20 ENCOUNTER — Encounter (INDEPENDENT_AMBULATORY_CARE_PROVIDER_SITE_OTHER): Payer: Self-pay | Admitting: Ophthalmology

## 2022-02-20 MED ORDER — AFLIBERCEPT 2MG/0.05ML IZ SOLN FOR KALEIDOSCOPE
2.0000 mg | INTRAVITREAL | Status: AC | PRN
  Administered 2022-02-20: 2 mg via INTRAVITREAL

## 2022-03-17 NOTE — Progress Notes (Signed)
[facility] Note  03/19/2022     CHIEF COMPLAINT Patient presents for Retina Follow Up   HISTORY OF PRESENT ILLNESS: [name] is a 51 y.o. female who presents to the clinic today for:  HPI     Retina Follow Up   Patient presents with  Other.  In right eye.  This started 4 weeks ago.  I, the attending physician,  performed the HPI with the patient and updated documentation appropriately.        Comments   Patient here for 4 weeks retina follow up for CSCR OD. Patient states vision is good. No eye pain.       Last edited by [name], MD on 03/19/2022  3:22 PM.     Referring physician: No referring provider defined for this encounter.  HISTORICAL INFORMATION:   Selected notes from the MEDICAL RECORD NUMBER Referred by [name] for concern of sub-macular fluid / retinoschisis   CURRENT MEDICATIONS: No current outpatient medications on file. (Ophthalmic Drugs)   No current facility-administered medications for this visit. (Ophthalmic Drugs)   Current Outpatient Medications (Other)  Medication Sig   diphenhydrAMINE HCl (BENADRYL ALLERGY PO) Take by mouth.   eplerenone (INSPRA) 50 MG tablet Take 1 tablet by mouth once daily   fexofenadine (ALLEGRA) 180 MG tablet Take 180 mg by mouth daily.   hydrochlorothiazide (HYDRODIURIL) 25 MG tablet TAKE 1 TABLET BY MOUTH ONCE DAILY IN THE MORNING FOR 90 DAYS   lisinopril (PRINIVIL,ZESTRIL) 20 MG tablet Take 1 tablet (20 mg total) by mouth daily.   No current facility-administered medications for this visit. (Other)   REVIEW OF SYSTEMS: ROS   Positive for: Eyes, Psychiatric Negative for: Constitutional, Gastrointestinal, Neurological, Skin, Genitourinary, Musculoskeletal, HENT, Endocrine, Cardiovascular, Respiratory, Allergic/Imm, Heme/Lymph Last edited by [name], COA on 03/19/2022  2:17 PM.     ALLERGIES Allergies  Allergen Reactions   Pollen Extract  Itching   PAST MEDICAL HISTORY Past Medical History:  Diagnosis Date   Abnormal Pap smear of cervix    12-28-21 lgsil HPV HR +   Cataract    Mixed form OU   CIN I (cervical intraepithelial neoplasia I)    Depression    High risk HPV infection    Hypertension    Hypertensive retinopathy    OU   Missed ab AGUST 6 2014   Retina disorder 10/22/2019   pt states taking inspra for excess fluid in retina   Sciatic nerve injury    Past Surgical History:  Procedure Laterality Date   MYOMECTOMY  2008   FAMILY HISTORY Family History  Problem Relation Age of Onset   Hypertension Mother    Diabetes Father    Diabetes Maternal Grandmother    Hypertension Maternal Grandmother    Colon cancer Neg Hx    Colon polyps Neg Hx    Esophageal cancer Neg Hx    Rectal cancer Neg Hx    Stomach cancer Neg Hx    SOCIAL HISTORY Social History   Tobacco Use   Smoking status: Never   Smokeless tobacco: Never  Vaping Use   Vaping Use: Never used  Substance Use Topics   Alcohol use: No    Alcohol/week: 0.0 standard drinks of alcohol   Drug use: No       OPHTHALMIC EXAM: Base Eye Exam     Visual Acuity (Snellen - Linear)       Right Left   Dist  cc 20/100 -2 20/25   Dist ph cc 20/80 -2 20/20 -2    Correction: Glasses         Tonometry (Tonopen, 2:15 PM)       Right Left   Pressure 09 07         Pupils       Dark Light Shape React APD   Right 5 4 Round Brisk None   Left 5 4 Round Brisk None         Visual Fields (Counting fingers)       Left Right    Full Full         Extraocular Movement       Right Left    Full, Ortho Full, Ortho         Neuro/Psych     Oriented x3: Yes   Mood/Affect: Normal         Dilation     Both eyes: 1.0% Mydriacyl, 2.5% Phenylephrine @ 2:15 PM           Slit Lamp and Fundus Exam     Slit Lamp Exam       Right Left   Lids/Lashes Dermatochalasis - upper lid Dermatochalasis - upper lid   Conjunctiva/Sclera  Injection, nasal pterygium 1+ Injection, nasal Pinguecula   Cornea Nasal ptergyium extending 2.76m onto nasal cornea 1+Punctate epithelial erosions   Anterior Chamber Deep, clear, narrow temporal angle, No cell or flare deep, narrow temporal angle, 1+pigment   Iris Round and dilated Round and dilated   Lens 2+ Nuclear sclerosis, 2+ Cortical cataract 2+ Nuclear sclerosis, 2+ Cortical cataract   Anterior Vitreous Mild Vitreous syneresis, Posterior vitreous detachment, vitreous condensations Mild Vitreous syneresis         Fundus Exam       Right Left   Disc Pink and Sharp, +cupping Pink and Sharp, +cupping   C/D Ratio 0.65 0.5   Macula Flat, Blunted foveal reflex, central atrophy with interval improvement in shallow SRF central macula -- resolved, RPE mottling, clumping and atrophy, No heme Flat, good foveal reflex, Epiretinal membrane, mild RPE mottling and clumping superiorly, No heme or edema   Vessels attenuated, Tortuous Vascular attenuation, mild Tortuousity   Periphery Attached, bullous Retinoschisis cavity from 0700-0830 -- no associated RT/RD - stable from prior, shallow schisis cavity ST, mild peripheral cystoid degeneration Attached, temporal retinoschisis cavity peripheral -- stable from prior           Refraction     Wearing Rx       Sphere Cylinder Axis   Right +1.25 +1.25 112   Left +2.25 +1.00 101           IMAGING AND PROCEDURES  Imaging and Procedures for '@TODAY'$ @  OCT, Retina - OU - Both Eyes       Right Eye Quality was good. Central Foveal Thickness: 235. Progression has improved. Findings include normal foveal contour, no IRF, no SRF, intraretinal hyper-reflective material, outer retinal atrophy, vitreomacular adhesion (Mild interval improvement in shallow SRF -- resolved, persistent ORA in areas of prior SRF, persistent bullous schisis caught IT periphery on widefield -- stable from prior).   Left Eye Quality was good. Central Foveal Thickness: 299.  Progression has been stable. Findings include normal foveal contour, no IRF, no SRF, vitreomacular adhesion (+schisis IT quad caught on widefield).   Notes *Images captured and stored on drive  Diagnosis / Impression:  OD: Mild interval improvement in shallow SRF -- resolved, persistent ORA in areas  of prior SRF, persistent bullous schisis caught IT periphery on widefield -- stable from prior OS: NFP, no IRF/SRF centrally, +VMA; +schisis IT quad caught on widefield  Clinical management:  See below  Abbreviations: NFP - Normal foveal profile. CME - cystoid macular edema. PED - pigment epithelial detachment. IRF - intraretinal fluid. SRF - subretinal fluid. EZ - ellipsoid zone. ERM - epiretinal membrane. ORA - outer retinal atrophy. ORT - outer retinal tubulation. SRHM - subretinal hyper-reflective material      Intravitreal Injection, Pharmacologic Agent - OD - Right Eye       Time Out 03/19/2022. 2:53 PM. Confirmed correct patient, procedure, site, and patient consented.   Anesthesia Topical anesthesia was used. Anesthetic medications included Lidocaine 2%, Proparacaine 0.5%.   Procedure Preparation included 5% betadine to ocular surface, eyelid speculum. A (32g) needle was used.   Injection: 2 mg aflibercept 2 MG/0.05ML   Route: Intravitreal, Site: Right Eye   NDC: [id number], Lot: [id number], Expiration date: 01/09/2023, Waste: 0 mL   Post-op Post injection exam found visual acuity of at least counting fingers. The patient tolerated the procedure well. There were no complications. The patient received written and verbal post procedure care education. Post injection medications were not given.            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 Exudative age-related macular degeneration of right eye with active choroidal neovascularization (HCC)  H35.3211 OCT, Retina - OU - Both Eyes     Intravitreal Injection, Pharmacologic Agent - OD - Right Eye    aflibercept (EYLEA) SOLN 2 mg    3. 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 Essential hypertension  I10     5. Hypertensive retinopathy of both eyes  H35.033     6. Combined forms of age-related cataract of both eyes  H25.813      1,2. CSCR OD -- chronic  - pt reports decreased vision OD since December 2019  - reports significant stressors in life -- work  - denies steroid use  - FA (09.30.20) shows focal hyperfluorescent leakage points nasal macula OD -- expansile dot phenotype?  - started 50 mg po eplerenone since 09.30.20  -- pt self d/c in February, re-started in June 2021  - s/p IVA #1 (09.01.21), #2 (09.29.21), #3 (11.03.21), #4 (12.03.21) -- IVA resistance  - s/p IVE OD #1 (01.20.22), #2 (02.18.22), #3 (04.05.22-sample), #4 (05.23.22), # 5 (06.27.22), #6 (07.28.22), #7 (08.26.22), #8 (09.23.22), #9 (10.21.22), #10 (11.18.22), #11 (12.16.22), #12 (01.20.23), #13 (03.02.23), #14 (04.07.23), #15 (05.12.23)  - BCVA improved to 20/80  - OCT shows JH:ERDE interval improvement in shallow SRF -- resolved, persistent ORA in areas of prior SRF, persistent bullous schisis caught IT periphery on widefield -- stable from prior at 4 wks  - differential includes atypical exudative ARMD  - recommend IVE OD #16 today, 06.09.23 with f/u in 4-5 weeks  - RBA of procedure discussed, questions answered  - IVA informed consent obtained and signed 09.01.21  - IVE informed consent obtained and signed, 01.20.22  - see procedure note  - Eylea[employer] benefits investigation initiated 12.3.21 -- approved for 2023  - continue po eplerenone 50 mg daily  - f/u 4-5 weeks -- DFE/OCT, possible injection  3. Retinoschisis OU (OD > OS) -- stable  - bullous retinoschisis cavity IT quadrant OD-- 0814-4818 oclock  - shallow retinoschisis cavity  IT quadrant OS  - no associated retinal holes/RT/RD on scleral depression    - widefield OCT shows stable bullous  schisis OD  - discussed findings and prognosis   - recommend observation -- consider laser retinopexy if schisis progresses or threatens macula or develops RD  4,5. Hypertensive retinopathy OU  - discussed importance of tight BP control  - monitor   6. 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  - discussed diagnosis and progression  - not yet visually significant  - monitor for now  Ophthalmic Meds Ordered this visit:  Meds ordered this encounter  Medications   aflibercept (EYLEA) SOLN 2 mg     Return for f/u 4-5 weeks, CSR OD, DFE, OCT.  There are no Patient Instructions on file for this visit.  This document serves as a record of services personally performed by Gardiner Sleeper, MD, PhD. It was created on their behalf by Leonie Douglas, an ophthalmic technician. The creation of this record is the provider's dictation and/or activities during the visit.    Electronically signed by: Leonie Douglas COA, 03/19/22  3:31 PM  This document serves as a record of services personally performed by Gardiner Sleeper, MD, PhD. It was created on their behalf by San Jetty. Owens Shark, OA an ophthalmic technician. The creation of this record is the provider's dictation and/or activities during the visit.    Electronically signed by: San Jetty. Owens Shark, [place] 06.09.2023 3:31 PM  Gardiner Sleeper, M.D., Ph.D. Diseases & Surgery of the Retina and Vitreous [facility]  I have reviewed the above documentation for accuracy and completeness, and I agree with the above. Gardiner Sleeper, M.D., Ph.D. 03/19/22 3:31 PM  Abbreviations: M myopia (nearsighted); A astigmatism; H hyperopia (farsighted); P presbyopia; Mrx spectacle prescription;  CTL contact lenses; OD right eye; OS left eye; OU both eyes  XT exotropia; ET esotropia; PEK punctate epithelial keratitis; PEE punctate epithelial erosions; DES dry  eye syndrome; MGD meibomian gland dysfunction; ATs artificial tears; PFAT's preservative free artificial tears; [place] nuclear sclerotic cataract; PSC posterior subcapsular cataract; ERM epi-retinal membrane; PVD posterior vitreous detachment; RD retinal detachment; DM diabetes mellitus; DR diabetic retinopathy; NPDR non-proliferative diabetic retinopathy; PDR proliferative diabetic retinopathy; CSME clinically significant macular edema; DME diabetic macular edema; dbh dot blot hemorrhages; CWS cotton wool spot; POAG primary open angle glaucoma; C/D cup-to-disc ratio; HVF humphrey visual field; GVF goldmann visual field; OCT optical coherence tomography; IOP intraocular pressure; BRVO Branch retinal vein occlusion; CRVO central retinal vein occlusion; CRAO central retinal artery occlusion; BRAO branch retinal artery occlusion; RT retinal tear; SB scleral buckle; PPV pars plana vitrectomy; VH Vitreous hemorrhage; PRP panretinal laser photocoagulation; IVK intravitreal kenalog; VMT vitreomacular traction; MH Macular hole;  NVD neovascularization of the disc; NVE neovascularization elsewhere; AREDS age related eye disease study; ARMD age related macular degeneration; POAG primary open angle glaucoma; EBMD epithelial/anterior basement membrane dystrophy; ACIOL anterior chamber intraocular lens; IOL intraocular lens; PCIOL posterior chamber intraocular lens; Phaco/IOL phacoemulsification with intraocular lens placement; [place] photorefractive keratectomy; LASIK laser assisted in situ keratomileusis; HTN hypertension; DM diabetes mellitus; COPD chronic obstructive pulmonary disease

## 2022-03-19 ENCOUNTER — Ambulatory Visit (INDEPENDENT_AMBULATORY_CARE_PROVIDER_SITE_OTHER): Payer: Commercial Managed Care - PPO | Admitting: Ophthalmology

## 2022-03-19 ENCOUNTER — Encounter (INDEPENDENT_AMBULATORY_CARE_PROVIDER_SITE_OTHER): Payer: Self-pay | Admitting: Ophthalmology

## 2022-03-19 DIAGNOSIS — H353211 Exudative age-related macular degeneration, right eye, with active choroidal neovascularization: Secondary | ICD-10-CM

## 2022-03-19 DIAGNOSIS — H25813 Combined forms of age-related cataract, bilateral: Secondary | ICD-10-CM

## 2022-03-19 DIAGNOSIS — H35711 Central serous chorioretinopathy, right eye: Secondary | ICD-10-CM | POA: Diagnosis not present

## 2022-03-19 DIAGNOSIS — H35033 Hypertensive retinopathy, bilateral: Secondary | ICD-10-CM

## 2022-03-19 DIAGNOSIS — I10 Essential (primary) hypertension: Secondary | ICD-10-CM

## 2022-03-19 DIAGNOSIS — H33103 Unspecified retinoschisis, bilateral: Secondary | ICD-10-CM | POA: Diagnosis not present

## 2022-03-19 MED ORDER — AFLIBERCEPT 2MG/0.05ML IZ SOLN FOR KALEIDOSCOPE
2.0000 mg | INTRAVITREAL | Status: AC | PRN
  Administered 2022-03-19: 2 mg via INTRAVITREAL

## 2022-04-15 ENCOUNTER — Other Ambulatory Visit (INDEPENDENT_AMBULATORY_CARE_PROVIDER_SITE_OTHER): Payer: Self-pay

## 2022-04-15 MED ORDER — EPLERENONE 50 MG PO TABS
50.0000 mg | ORAL_TABLET | Freq: Every day | ORAL | 0 refills | Status: DC
Start: 2022-04-15 — End: 2022-06-18

## 2022-04-20 NOTE — Progress Notes (Signed)
[facility] Note  04/23/2022     CHIEF COMPLAINT Patient presents for Retina Follow Up   HISTORY OF PRESENT ILLNESS: [name] is a 51 y.o. female who presents to the clinic today for:  HPI     Retina Follow Up   Patient presents with  Other.  In right eye.  Severity is moderate.  Duration of 5 weeks.  Since onset it is stable.  I, the attending physician,  performed the HPI with the patient and updated documentation appropriately.        Comments   Pt here for 5 wk ret f/u CSCR OD. Pt states VA the same, no change.       Last edited by [name], MD on 04/24/2022 12:53 AM.    Pt states vision seems stable, vision is a little blurry when looking at small objects  Referring physician: No referring provider defined for this encounter.  HISTORICAL INFORMATION:   Selected notes from the MEDICAL RECORD NUMBER Referred by [name] for concern of sub-macular fluid / retinoschisis   CURRENT MEDICATIONS: No current outpatient medications on file. (Ophthalmic Drugs)   No current facility-administered medications for this visit. (Ophthalmic Drugs)   Current Outpatient Medications (Other)  Medication Sig   diphenhydrAMINE HCl (BENADRYL ALLERGY PO) Take by mouth.   eplerenone (INSPRA) 50 MG tablet Take 1 tablet (50 mg total) by mouth daily.   fexofenadine (ALLEGRA) 180 MG tablet Take 180 mg by mouth daily.   hydrochlorothiazide (HYDRODIURIL) 25 MG tablet TAKE 1 TABLET BY MOUTH ONCE DAILY IN THE MORNING FOR 90 DAYS   lisinopril (PRINIVIL,ZESTRIL) 20 MG tablet Take 1 tablet (20 mg total) by mouth daily.   No current facility-administered medications for this visit. (Other)   REVIEW OF SYSTEMS: ROS   Positive for: Eyes, Psychiatric Negative for: Constitutional, Gastrointestinal, Neurological, Skin, Genitourinary, Musculoskeletal, HENT, Endocrine, Cardiovascular, Respiratory, Allergic/Imm, Heme/Lymph Last edited by  Kingsley Spittle, COT on 04/23/2022  1:58 PM.     ALLERGIES Allergies  Allergen Reactions   Pollen Extract Itching   PAST MEDICAL HISTORY Past Medical History:  Diagnosis Date   Abnormal Pap smear of cervix    12-28-21 lgsil HPV HR +   Cataract    Mixed form OU   CIN I (cervical intraepithelial neoplasia I)    Depression    High risk HPV infection    Hypertension    Hypertensive retinopathy    OU   Missed ab AGUST 6 2014   Retina disorder 10/22/2019   pt states taking inspra for excess fluid in retina   Sciatic nerve injury    Past Surgical History:  Procedure Laterality Date   MYOMECTOMY  2008   FAMILY HISTORY Family History  Problem Relation Age of Onset   Hypertension Mother    Diabetes Father    Diabetes Maternal Grandmother    Hypertension Maternal Grandmother    Colon cancer Neg Hx    Colon polyps Neg Hx    Esophageal cancer Neg Hx    Rectal cancer Neg Hx    Stomach cancer Neg Hx    SOCIAL HISTORY Social History   Tobacco Use   Smoking status: Never   Smokeless tobacco: Never  Vaping Use   Vaping Use: Never used  Substance Use Topics   Alcohol use: No    Alcohol/week: 0.0 standard drinks of alcohol   Drug use: No       OPHTHALMIC EXAM: Base  Eye Exam     Visual Acuity (Snellen - Linear)       Right Left   Dist cc 20/100 -2 20/25 -1   Dist ph cc 20/80 -1 20/20 -2    Correction: Glasses         Tonometry (Tonopen, 2:03 PM)       Right Left   Pressure 16 14         Pupils       Pupils Dark Light Shape React APD   Right PERRL 5 4 Round Brisk None   Left PERRL 5 4 Round Brisk None         Visual Fields (Counting fingers)       Left Right    Full Full         Extraocular Movement       Right Left    Full, Ortho Full, Ortho         Neuro/Psych     Oriented x3: Yes   Mood/Affect: Normal         Dilation     Both eyes: 1.0% Mydriacyl, 2.5% Phenylephrine @ 2:03 PM           Slit Lamp and Fundus Exam      Slit Lamp Exam       Right Left   Lids/Lashes Dermatochalasis - upper lid Dermatochalasis - upper lid   Conjunctiva/Sclera Injection, nasal pterygium 1+ Injection, nasal Pinguecula   Cornea Nasal ptergyium extending 2.89m onto nasal cornea 1+Punctate epithelial erosions   Anterior Chamber Deep, clear, narrow temporal angle, No cell or flare deep, narrow temporal angle, 1+pigment   Iris Round and dilated Round and dilated   Lens 2+ Nuclear sclerosis, 2+ Cortical cataract 2+ Nuclear sclerosis, 2+ Cortical cataract   Anterior Vitreous Mild Vitreous syneresis, Posterior vitreous detachment, vitreous condensations Mild Vitreous syneresis         Fundus Exam       Right Left   Disc Pink and Sharp, +cupping Pink and Sharp, +cupping   C/D Ratio 0.65 0.6   Macula Flat, Blunted foveal reflex, central atrophy with interval increase in shallow SRF central macula, RPE mottling, clumping and atrophy, No heme Flat, good foveal reflex, Epiretinal membrane, mild RPE mottling and clumping superiorly, No heme or edema   Vessels attenuated, Tortuous Vascular attenuation, mild Tortuousity   Periphery Attached, bullous Retinoschisis cavity from 0700-0830 -- no associated RT/RD - stable from prior, shallow schisis cavity ST, mild peripheral cystoid degeneration Attached, temporal retinoschisis cavity peripheral -- stable from prior           Refraction     Wearing Rx       Sphere Cylinder Axis   Right +1.25 +1.25 112   Left +2.25 +1.00 101           IMAGING AND PROCEDURES  Imaging and Procedures for _0 @  OCT, Retina - OU - Both Eyes       Right Eye Quality was good. Central Foveal Thickness: 224. Progression has worsened. Findings include normal foveal contour, no IRF, intraretinal hyper-reflective material, subretinal fluid, outer retinal atrophy, vitreomacular adhesion (Interval re-development of shallow SRF, persistent ORA, persistent bullous schisis caught IT periphery on  widefield -- stable from prior).   Left Eye Quality was good. Central Foveal Thickness: 299. Progression has been stable. Findings include normal foveal contour, no IRF, no SRF, vitreomacular adhesion (+schisis IT quad caught on widefield).   Notes *Images captured and stored on drive  Diagnosis /  Impression:  OD: interval re-development of shallow SRF, persistent ORA, persistent bullous schisis caught IT periphery on widefield -- stable from prior OS: NFP, no IRF/SRF centrally, +VMA; +schisis IT quad caught on widefield  Clinical management:  See below  Abbreviations: NFP - Normal foveal profile. CME - cystoid macular edema. PED - pigment epithelial detachment. IRF - intraretinal fluid. SRF - subretinal fluid. EZ - ellipsoid zone. ERM - epiretinal membrane. ORA - outer retinal atrophy. ORT - outer retinal tubulation. SRHM - subretinal hyper-reflective material      Intravitreal Injection, Pharmacologic Agent - OD - Right Eye       Time Out 04/23/2022. 3:55 PM. Confirmed correct patient, procedure, site, and patient consented.   Anesthesia Topical anesthesia was used. Anesthetic medications included Lidocaine 2%, Proparacaine 0.5%.   Procedure Preparation included 5% betadine to ocular surface, eyelid speculum. A (32g) needle was used.   Injection: 2 mg aflibercept 2 MG/0.05ML   Route: Intravitreal, Site: Right Eye   NDC: [id number], Lot: [id number], Expiration date: 02/08/2023, Waste: 0 mL   Post-op Post injection exam found visual acuity of at least counting fingers. The patient tolerated the procedure well. There were no complications. The patient received written and verbal post procedure care education. Post injection medications were not given.            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 Essential hypertension  I10     5. Hypertensive retinopathy of both eyes  H35.033     6. Combined forms of age-related cataract of both eyes  H25.813      1,2. CSCR OD -- chronic  - pt reports decreased vision OD since December 2019  - reports significant stressors in life -- work  - denies steroid use  - FA (09.30.20) shows focal hyperfluorescent leakage points nasal macula OD -- expansile dot phenotype?  - started 50 mg po eplerenone since 09.30.20  -- pt self d/c in February, re-started in June 2021  - s/p IVA #1 (09.01.21), #2 (09.29.21), #3 (11.03.21), #4 (12.03.21) -- IVA resistance  - s/p IVE OD #1 (01.20.22), #2 (02.18.22), #3 (04.05.22-sample), #4 (05.23.22), # 5 (06.27.22), #6 (07.28.22), #7 (08.26.22), #8 (09.23.22), #9 (10.21.22), #10 (11.18.22), #11 (12.16.22), #12 (01.20.23), #13 (03.02.23), #14 (04.07.23), #15 (05.12.23), #16 (06.09.23)  - BCVA stable at 20/80  - OCT shows interval re-development of shallow SRF, persistent ORA, persistent bullous schisis caught IT periphery on widefield -- stable from prior at 5 wks  - differential includes atypical exudative ARMD  - recommend IVE OD #17 today, 07.14.23 with f/u in 4 weeks  - RBA of procedure discussed, questions answered  - IVA informed consent obtained and signed 09.01.21  - IVE informed consent obtained and signed, 01.20.22  - see procedure note  - [employer] benefits investigation initiated 12.3.21 -- approved for 2023  - continue po eplerenone 50 mg daily  - f/u 4 weeks -- DFE/OCT, possible injection  3. Retinoschisis  OU (OD > OS) -- stable  - bullous retinoschisis cavity IT quadrant OD-- 0315-9458 oclock  - shallow  retinoschisis cavity IT quadrant OS  - no associated retinal holes/RT/RD on scleral depression    - widefield OCT shows stable bullous schisis OD  - discussed findings and prognosis   - recommend observation -- consider laser retinopexy if schisis progresses or threatens macula or develops RD  4,5. Hypertensive retinopathy OU  - discussed importance of tight BP control  - monitor   6. 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  - discussed diagnosis and progression  - not yet visually significant  - monitor for now  Ophthalmic Meds Ordered this visit:  Meds ordered this encounter  Medications   aflibercept (EYLEA) SOLN 2 mg     Return in about 4 weeks (around 05/21/2022) for f/u 4 weeks, exu ARMD OD, DFE, OCT.  There are no Patient Instructions on file for this visit.  This document serves as a record of services personally performed by Gardiner Sleeper, MD, PhD. It was created on their behalf by Leonie Douglas, an ophthalmic technician. The creation of this record is the provider's dictation and/or activities during the visit.    Electronically signed by: Leonie Douglas COA, 04/24/22  12:56 AM  This document serves as a record of services personally performed by Gardiner Sleeper, MD, PhD. It was created on their behalf by San Jetty. Owens Shark, OA an ophthalmic technician. The creation of this record is the provider's dictation and/or activities during the visit.    Electronically signed by: San Jetty. Marguerita Merles 07.14.2023 12:56 AM   Gardiner Sleeper, M.D., Ph.D. Diseases & Surgery of the Retina and Vitreous [facility]  I have reviewed the above documentation for accuracy and completeness, and I agree with the above. Gardiner Sleeper, M.D., Ph.D. 04/24/22 12:58 AM   Abbreviations: M myopia (nearsighted); A astigmatism; H hyperopia (farsighted); P presbyopia; Mrx spectacle  prescription;  CTL contact lenses; OD right eye; OS left eye; OU both eyes  XT exotropia; ET esotropia; PEK punctate epithelial keratitis; PEE punctate epithelial erosions; DES dry eye syndrome; MGD meibomian gland dysfunction; ATs artificial tears; PFAT's preservative free artificial tears; [place] nuclear sclerotic cataract; PSC posterior subcapsular cataract; ERM epi-retinal membrane; PVD posterior vitreous detachment; RD retinal detachment; DM diabetes mellitus; DR diabetic retinopathy; NPDR non-proliferative diabetic retinopathy; PDR proliferative diabetic retinopathy; CSME clinically significant macular edema; DME diabetic macular edema; dbh dot blot hemorrhages; CWS cotton wool spot; POAG primary open angle glaucoma; C/D cup-to-disc ratio; HVF humphrey visual field; GVF goldmann visual field; OCT optical coherence tomography; IOP intraocular pressure; BRVO Branch retinal vein occlusion; CRVO central retinal vein occlusion; CRAO central retinal artery occlusion; BRAO branch retinal artery occlusion; RT retinal tear; SB scleral buckle; PPV pars plana vitrectomy; VH Vitreous hemorrhage; PRP panretinal laser photocoagulation; IVK intravitreal kenalog; VMT vitreomacular traction; MH Macular hole;  NVD neovascularization of the disc; NVE neovascularization elsewhere; AREDS age related eye disease study; ARMD age related macular degeneration; POAG primary open angle glaucoma; EBMD epithelial/anterior basement membrane dystrophy; ACIOL anterior chamber intraocular lens; IOL intraocular lens; PCIOL posterior chamber intraocular lens; Phaco/IOL phacoemulsification with intraocular lens placement; [place] photorefractive keratectomy; LASIK laser assisted in situ keratomileusis; HTN hypertension; DM diabetes mellitus; COPD chronic obstructive pulmonary disease

## 2022-04-23 ENCOUNTER — Ambulatory Visit (INDEPENDENT_AMBULATORY_CARE_PROVIDER_SITE_OTHER): Payer: Commercial Managed Care - PPO | Admitting: Ophthalmology

## 2022-04-23 ENCOUNTER — Encounter (INDEPENDENT_AMBULATORY_CARE_PROVIDER_SITE_OTHER): Payer: Self-pay | Admitting: Ophthalmology

## 2022-04-23 DIAGNOSIS — H33103 Unspecified retinoschisis, bilateral: Secondary | ICD-10-CM | POA: Diagnosis not present

## 2022-04-23 DIAGNOSIS — H35711 Central serous chorioretinopathy, right eye: Secondary | ICD-10-CM

## 2022-04-23 DIAGNOSIS — I10 Essential (primary) hypertension: Secondary | ICD-10-CM

## 2022-04-23 DIAGNOSIS — H35033 Hypertensive retinopathy, bilateral: Secondary | ICD-10-CM

## 2022-04-23 DIAGNOSIS — H25813 Combined forms of age-related cataract, bilateral: Secondary | ICD-10-CM

## 2022-04-23 DIAGNOSIS — H353211 Exudative age-related macular degeneration, right eye, with active choroidal neovascularization: Secondary | ICD-10-CM | POA: Diagnosis not present

## 2022-04-24 ENCOUNTER — Encounter (INDEPENDENT_AMBULATORY_CARE_PROVIDER_SITE_OTHER): Payer: Self-pay | Admitting: Ophthalmology

## 2022-04-24 MED ORDER — AFLIBERCEPT 2MG/0.05ML IZ SOLN FOR KALEIDOSCOPE
2.0000 mg | INTRAVITREAL | Status: AC | PRN
  Administered 2022-04-24: 2 mg via INTRAVITREAL

## 2022-05-10 NOTE — Progress Notes (Signed)
[facility] Note  05/21/2022     CHIEF COMPLAINT Patient presents for Retina Follow Up   HISTORY OF PRESENT ILLNESS: [name] is a 51 y.o. female who presents to the clinic today for:  HPI     Retina Follow Up   Patient presents with  Other (IVA OD 07.14.23).  In right eye.  Severity is moderate.  Duration of 4 weeks.  Since onset it is stable.  I, the attending physician,  performed the HPI with the patient and updated documentation appropriately.        Comments   Patient feels that the vision is the same. She admits to seeing flashes and floaters.      Last edited by [name], MD on 05/21/2022  3:21 PM.    Pt is still taking eplerenone. Pt under extreme stress at work  Referring physician: No referring provider defined for this encounter.  HISTORICAL INFORMATION:   Selected notes from the MEDICAL RECORD NUMBER Referred by [name] for concern of sub-macular fluid / retinoschisis   CURRENT MEDICATIONS: No current outpatient medications on file. (Ophthalmic Drugs)   No current facility-administered medications for this visit. (Ophthalmic Drugs)   Current Outpatient Medications (Other)  Medication Sig   diphenhydrAMINE HCl (BENADRYL ALLERGY PO) Take by mouth.   eplerenone (INSPRA) 50 MG tablet Take 1 tablet (50 mg total) by mouth daily.   fexofenadine (ALLEGRA) 180 MG tablet Take 180 mg by mouth daily.   hydrochlorothiazide (HYDRODIURIL) 25 MG tablet TAKE 1 TABLET BY MOUTH ONCE DAILY IN THE MORNING FOR 90 DAYS   lisinopril (PRINIVIL,ZESTRIL) 20 MG tablet Take 1 tablet (20 mg total) by mouth daily.   No current facility-administered medications for this visit. (Other)   REVIEW OF SYSTEMS: ROS   Positive for: Eyes Last edited by [name], MD on 05/23/2022 12:41 AM.      ALLERGIES Allergies  Allergen Reactions   Pollen Extract Itching   PAST MEDICAL HISTORY Past Medical History:  Diagnosis  Date   Abnormal Pap smear of cervix    12-28-21 lgsil HPV HR +   Cataract    Mixed form OU   CIN I (cervical intraepithelial neoplasia I)    Depression    High risk HPV infection    Hypertension    Hypertensive retinopathy    OU   Missed ab AGUST 6 2014   Retina disorder 10/22/2019   pt states taking inspra for excess fluid in retina   Sciatic nerve injury    Past Surgical History:  Procedure Laterality Date   MYOMECTOMY  2008   FAMILY HISTORY Family History  Problem Relation Age of Onset   Hypertension Mother    Diabetes Father    Diabetes Maternal Grandmother    Hypertension Maternal Grandmother    Colon cancer Neg Hx    Colon polyps Neg Hx    Esophageal cancer Neg Hx    Rectal cancer Neg Hx    Stomach cancer Neg Hx    SOCIAL HISTORY Social History   Tobacco Use   Smoking status: Never   Smokeless tobacco: Never  Vaping Use   Vaping Use: Never used  Substance Use Topics   Alcohol use: No    Alcohol/week: 0.0 standard drinks of alcohol   Drug use: No       OPHTHALMIC EXAM: Base Eye Exam     Visual Acuity (Snellen - Linear)       Right  Left   Dist cc 20/100 20/25   Dist ph cc NI     Correction: Glasses         Tonometry (Tonopen, 1:22 PM)       Right Left   Pressure 14 13         Pupils       Dark Light Shape React APD   Right 5 4 Round Brisk None   Left 5 4 Round Brisk None         Visual Fields       Left Right    Full Full         Extraocular Movement       Right Left    Full, Ortho Full, Ortho         Neuro/Psych     Oriented x3: Yes   Mood/Affect: Normal         Dilation     Both eyes: 1.0% Mydriacyl, 2.5% Phenylephrine @ 1:19 PM           Slit Lamp and Fundus Exam     Slit Lamp Exam       Right Left   Lids/Lashes Dermatochalasis - upper lid Dermatochalasis - upper lid   Conjunctiva/Sclera Injection, nasal pterygium 1+ Injection, nasal Pinguecula   Cornea Nasal ptergyium extending 2.22m onto  nasal cornea 1+Punctate epithelial erosions   Anterior Chamber Deep, clear, narrow temporal angle, No cell or flare deep, narrow temporal angle, 1+pigment   Iris Round and dilated Round and dilated   Lens 2+ Nuclear sclerosis, 2+ Cortical cataract 2+ Nuclear sclerosis, 2+ Cortical cataract   Anterior Vitreous Mild Vitreous syneresis, Posterior vitreous detachment, vitreous condensations Mild Vitreous syneresis         Fundus Exam       Right Left   Disc Pink and Sharp, +cupping Pink and Sharp, +cupping, shallow peripapillary SRF just nasal to disc   C/D Ratio 0.65 0.6   Macula Flat, Blunted foveal reflex, central atrophy with mild interval increase in shallow SRF inferior macula, RPE mottling, clumping and atrophy, No heme Flat, good foveal reflex, Epiretinal membrane, mild RPE mottling and clumping superiorly, No heme or edema   Vessels attenuated, Tortuous Vascular attenuation, mild Tortuousity   Periphery Attached, bullous Retinoschisis cavity from 0700-0830 -- no associated RT/RD - stable from prior, shallow schisis cavity ST, mild peripheral cystoid degeneration Attached, temporal retinoschisis cavity peripheral -- stable from prior           Refraction     Wearing Rx       Sphere Cylinder Axis   Right +1.25 +1.25 112   Left +2.25 +1.00 101           IMAGING AND PROCEDURES  Imaging and Procedures for '@TODAY'$ @  OCT, Retina - OU - Both Eyes       Right Eye Quality was good. Central Foveal Thickness: 262. Progression has worsened. Findings include normal foveal contour, no IRF, intraretinal hyper-reflective material, subretinal fluid, outer retinal atrophy, vitreomacular adhesion (Persistent SRF remains shallow centrally, interval increase inferiorly, persistent ORA, persistent bullous schisis caught IT periphery on widefield -- stable from prior).   Left Eye Quality was good. Central Foveal Thickness: 299. Progression has worsened. Findings include normal foveal  contour, no IRF, no SRF, vitreomacular adhesion (Shallow pocket of SRF nasal disc caught on widefield, +schisis IT quad caught on widefield).   Notes *Images captured and stored on drive  Diagnosis / Impression:  OD: Persistent SRF remains shallow  centrally, interval increase inferiorly, persistent ORA, persistent bullous schisis caught IT periphery on widefield -- stable from prior OS: NFP, no IRF/SRF centrally, +VMA; Shallow pocket of SRF nasal disc caught on widefield, +schisis IT quad caught on widefield  Clinical management:  See below  Abbreviations: NFP - Normal foveal profile. CME - cystoid macular edema. PED - pigment epithelial detachment. IRF - intraretinal fluid. SRF - subretinal fluid. EZ - ellipsoid zone. ERM - epiretinal membrane. ORA - outer retinal atrophy. ORT - outer retinal tubulation. SRHM - subretinal hyper-reflective material      Intravitreal Injection, Pharmacologic Agent - OD - Right Eye       Time Out 05/21/2022. 1:52 PM. Confirmed correct patient, procedure, site, and patient consented.   Anesthesia Topical anesthesia was used. Anesthetic medications included Lidocaine 2%, Proparacaine 0.5%.   Procedure Preparation included 5% betadine to ocular surface, eyelid speculum. A (32g) needle was used.   Injection: 2 mg aflibercept 2 MG/0.05ML   Route: Intravitreal, Site: Right Eye   NDC: [id number], Lot: [id number], Expiration date: 07/11/2023, Waste: 0 mL   Post-op Post injection exam found visual acuity of at least counting fingers. The patient tolerated the procedure well. There were no complications. The patient received written and verbal post procedure care education. Post injection medications were not given.            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 Essential hypertension  I10     5. Hypertensive retinopathy of both eyes  H35.033     6. Combined forms of age-related cataract of both eyes  H25.813      1,2. CSCR OD -- chronic  - pt reports decreased vision OD since December 2019  - reports significant stressors in life -- work  - denies steroid use  - FA (09.30.20) shows focal hyperfluorescent leakage points nasal macula OD -- expansile dot phenotype?  - started 50 mg po eplerenone since 09.30.20  -- pt self d/c in February, re-started in June 2021  - s/p IVA #1 (09.01.21), #2 (09.29.21), #3 (11.03.21), #4 (12.03.21) -- IVA resistance  - s/p IVE OD #1 (01.20.22), #2 (02.18.22), #3 (04.05.22-sample), #4 (05.23.22), # 5 (06.27.22), #6 (07.28.22), #7 (08.26.22), #8 (09.23.22), #9 (10.21.22), #10 (11.18.22), #11 (12.16.22), #12 (01.20.23), #13 (03.02.23), #14 (04.07.23), #15 (05.12.23), #16 (06.09.23), #17 (07.14.23)  - BCVA OD decreased to 20/100 from 20/80  - OCT shows OD: Persistent SRF remains shallow centrally, interval increase inferiorly, persistent ORA at 4 weeks  - differential includes atypical exudative ARMD  - recommend IVE OD #18 today, 08.08.23 with f/u in 4 weeks  - RBA of procedure discussed, questions answered  - IVA informed consent obtained and signed 09.01.21  - IVE informed consent obtained and signed, 01.20.22  - see procedure note  - Eylea[employer] benefits investigation initiated 12.3.21 -- approved for 2023  - continue po eplerenone 50 mg daily  - f/u 4 weeks -- DFE/OCT, possible injection  3. Retinoschisis OU (OD > OS) -- stable  -  bullous retinoschisis cavity IT quadrant OD-- 4010-2725 oclock  - shallow retinoschisis cavity IT quadrant OS  - no associated retinal  holes/RT/RD on scleral depression    - widefield OCT shows stable bullous schisis OD  - discussed findings and prognosis   - recommend observation -- consider laser retinopexy if schisis progresses or threatens macula or develops RD  4,5. Hypertensive retinopathy OU  - discussed importance of tight BP control  - monitor   6. 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  - discussed diagnosis and progression  - not yet visually significant  - monitor for now  Ophthalmic Meds Ordered this visit:  Meds ordered this encounter  Medications   aflibercept (EYLEA) SOLN 2 mg     Return in about 4 weeks (around 06/18/2022) for f/u CSR OD, DFE, OCT.  There are no Patient Instructions on file for this visit.  This document serves as a record of services personally performed by Gardiner Sleeper, MD, PhD. It was created on their behalf by Renaldo Reel, [place] an ophthalmic technician. The creation of this record is the provider's dictation and/or activities during the visit.    Electronically signed by:  Renaldo Reel, COT  05/10/22 12:42 AM  This document serves as a record of services personally performed by Gardiner Sleeper, MD, PhD. It was created on their behalf by San Jetty. Owens Shark, OA an ophthalmic technician. The creation of this record is the provider's dictation and/or activities during the visit.    Electronically signed by: San Jetty. Owens Shark, [place] 08.11.2023 12:42 AM  Gardiner Sleeper, M.D., Ph.D. Diseases & Surgery of the Retina and Vitreous [facility]  I have reviewed the above documentation for accuracy and completeness, and I agree with the above. Gardiner Sleeper, M.D., Ph.D. 05/23/22 12:45 AM   Abbreviations: M myopia (nearsighted); A astigmatism; H hyperopia (farsighted); P presbyopia; Mrx spectacle prescription;  CTL contact lenses; OD right eye; OS left eye;  OU both eyes  XT exotropia; ET esotropia; PEK punctate epithelial keratitis; PEE punctate epithelial erosions; DES dry eye syndrome; MGD meibomian gland dysfunction; ATs artificial tears; PFAT's preservative free artificial tears; [place] nuclear sclerotic cataract; PSC posterior subcapsular cataract; ERM epi-retinal membrane; PVD posterior vitreous detachment; RD retinal detachment; DM diabetes mellitus; DR diabetic retinopathy; NPDR non-proliferative diabetic retinopathy; PDR proliferative diabetic retinopathy; CSME clinically significant macular edema; DME diabetic macular edema; dbh dot blot hemorrhages; CWS cotton wool spot; POAG primary open angle glaucoma; C/D cup-to-disc ratio; HVF humphrey visual field; GVF goldmann visual field; OCT optical coherence tomography; IOP intraocular pressure; BRVO Branch retinal vein occlusion; CRVO central retinal vein occlusion; CRAO central retinal artery occlusion; BRAO branch retinal artery occlusion; RT retinal tear; SB scleral buckle; PPV pars plana vitrectomy; VH Vitreous hemorrhage; PRP panretinal laser photocoagulation; IVK intravitreal kenalog; VMT vitreomacular traction; MH Macular hole;  NVD neovascularization of the disc; NVE neovascularization elsewhere; AREDS age related eye disease study; ARMD age related macular degeneration; POAG primary open angle glaucoma; EBMD epithelial/anterior basement membrane dystrophy; ACIOL anterior chamber intraocular lens; IOL intraocular lens; PCIOL posterior chamber intraocular lens; Phaco/IOL phacoemulsification with intraocular lens placement; [place] photorefractive keratectomy; LASIK laser assisted in situ keratomileusis; HTN hypertension; DM diabetes mellitus; COPD chronic obstructive pulmonary disease

## 2022-05-21 ENCOUNTER — Ambulatory Visit (INDEPENDENT_AMBULATORY_CARE_PROVIDER_SITE_OTHER): Payer: Commercial Managed Care - PPO | Admitting: Ophthalmology

## 2022-05-21 ENCOUNTER — Encounter (INDEPENDENT_AMBULATORY_CARE_PROVIDER_SITE_OTHER): Payer: Self-pay | Admitting: Ophthalmology

## 2022-05-21 DIAGNOSIS — H33103 Unspecified retinoschisis, bilateral: Secondary | ICD-10-CM

## 2022-05-21 DIAGNOSIS — I10 Essential (primary) hypertension: Secondary | ICD-10-CM

## 2022-05-21 DIAGNOSIS — H35033 Hypertensive retinopathy, bilateral: Secondary | ICD-10-CM

## 2022-05-21 DIAGNOSIS — H353211 Exudative age-related macular degeneration, right eye, with active choroidal neovascularization: Secondary | ICD-10-CM | POA: Diagnosis not present

## 2022-05-21 DIAGNOSIS — H35711 Central serous chorioretinopathy, right eye: Secondary | ICD-10-CM

## 2022-05-21 DIAGNOSIS — H25813 Combined forms of age-related cataract, bilateral: Secondary | ICD-10-CM

## 2022-05-21 MED ORDER — AFLIBERCEPT 2MG/0.05ML IZ SOLN FOR KALEIDOSCOPE
2.0000 mg | INTRAVITREAL | Status: AC | PRN
  Administered 2022-05-21: 2 mg via INTRAVITREAL

## 2022-06-04 NOTE — Progress Notes (Signed)
[facility] Note  06/18/2022     CHIEF COMPLAINT Patient presents for Retina Follow Up   HISTORY OF PRESENT ILLNESS: [name] is a 51 y.o. female who presents to the clinic today for:  HPI     Retina Follow Up   Patient presents with  Other.  In right eye.  Severity is moderate.  Duration of 4 weeks.  Since onset it is stable.  I, the attending physician,  performed the HPI with the patient and updated documentation appropriately.        Comments   Patient feels that the eyes are doing well and there has been no vision changes at this time.       Last edited by [name], MD on 06/18/2022  4:20 PM.    Pt states her boss at work was fired 2 weeks ago, so she has been under a lot less stress  Referring physician: No referring provider defined for this encounter.  HISTORICAL INFORMATION:   Selected notes from the MEDICAL RECORD NUMBER Referred by [name] for concern of sub-macular fluid / retinoschisis   CURRENT MEDICATIONS: No current outpatient medications on file. (Ophthalmic Drugs)   No current facility-administered medications for this visit. (Ophthalmic Drugs)   Current Outpatient Medications (Other)  Medication Sig   diphenhydrAMINE HCl (BENADRYL ALLERGY PO) Take by mouth.   eplerenone (INSPRA) 50 MG tablet Take 1 tablet (50 mg total) by mouth daily.   fexofenadine (ALLEGRA) 180 MG tablet Take 180 mg by mouth daily.   hydrochlorothiazide (HYDRODIURIL) 25 MG tablet TAKE 1 TABLET BY MOUTH ONCE DAILY IN THE MORNING FOR 90 DAYS   lisinopril (PRINIVIL,ZESTRIL) 20 MG tablet Take 1 tablet (20 mg total) by mouth daily.   No current facility-administered medications for this visit. (Other)   REVIEW OF SYSTEMS: ROS   Positive for: Cardiovascular, Eyes Negative for: Constitutional, Gastrointestinal, Neurological, Skin, Genitourinary, Musculoskeletal, HENT, Endocrine, Respiratory, Psychiatric, Allergic/Imm,  Heme/Lymph Last edited by [name], MD on 06/18/2022  4:20 PM.     ALLERGIES Allergies  Allergen Reactions   Pollen Extract Itching   PAST MEDICAL HISTORY Past Medical History:  Diagnosis Date   Abnormal Pap smear of cervix    12-28-21 lgsil HPV HR +   Cataract    Mixed form OU   CIN I (cervical intraepithelial neoplasia I)    Depression    High risk HPV infection    Hypertension    Hypertensive retinopathy    OU   Missed ab AGUST 6 2014   Retina disorder 10/22/2019   pt states taking inspra for excess fluid in retina   Sciatic nerve injury    Past Surgical History:  Procedure Laterality Date   MYOMECTOMY  2008   FAMILY HISTORY Family History  Problem Relation Age of Onset   Hypertension Mother    Diabetes Father    Diabetes Maternal Grandmother    Hypertension Maternal Grandmother    Colon cancer Neg Hx    Colon polyps Neg Hx    Esophageal cancer Neg Hx    Rectal cancer Neg Hx    Stomach cancer Neg Hx    SOCIAL HISTORY Social History   Tobacco Use   Smoking status: Never   Smokeless tobacco: Never  Vaping Use   Vaping Use: Never used  Substance Use Topics   Alcohol use: No    Alcohol/week: 0.0 standard drinks of alcohol   Drug use: No  OPHTHALMIC EXAM: Base Eye Exam     Visual Acuity (Snellen - Linear)       Right Left   Dist cc 20/100 20/25   Dist ph cc 20/80     Correction: Glasses         Tonometry (Tonopen, 1:28 PM)       Right Left   Pressure 12 14         Pupils       Dark Light Shape React APD   Right 5 4 Round Brisk None   Left 5 4 Round Brisk None         Visual Fields       Left Right    Full Full         Extraocular Movement       Right Left    Full, Ortho Full, Ortho         Neuro/Psych     Oriented x3: Yes   Mood/Affect: Normal         Dilation     Both eyes: 1.0% Mydriacyl, 2.5% Phenylephrine @ 1:25 PM           Slit Lamp and Fundus Exam     Slit Lamp Exam       Right  Left   Lids/Lashes Dermatochalasis - upper lid Dermatochalasis - upper lid   Conjunctiva/Sclera Injection, nasal pterygium 1+ Injection, nasal Pinguecula   Cornea Nasal ptergyium extending 2.75m onto nasal cornea 1+Punctate epithelial erosions   Anterior Chamber Deep, clear, narrow temporal angle, No cell or flare deep, narrow temporal angle, 1+pigment   Iris Round and dilated Round and dilated   Lens 2+ Nuclear sclerosis, 2+ Cortical cataract 2+ Nuclear sclerosis, 2+ Cortical cataract   Anterior Vitreous Mild Vitreous syneresis, Posterior vitreous detachment, vitreous condensations Mild Vitreous syneresis         Fundus Exam       Right Left   Disc Pink and Sharp, +cupping Pink and Sharp, +cupping, shallow peripapillary SRF just nasal to disc - improved   C/D Ratio 0.65 0.6   Macula Flat, Blunted foveal reflex, central atrophy with mild interval improvement in shallow SRF inferior macula, RPE mottling, clumping and atrophy, No heme Flat, good foveal reflex, Epiretinal membrane, mild RPE mottling and clumping superiorly, No heme or edema   Vessels attenuated, Tortuous Vascular attenuation, mild Tortuousity   Periphery Attached, bullous Retinoschisis cavity from 0700-0830 -- no associated RT/RD - stable from prior, shallow schisis cavity ST, mild peripheral cystoid degeneration Attached, temporal retinoschisis cavity peripheral -- stable from prior           Refraction     Wearing Rx       Sphere Cylinder Axis   Right +1.25 +1.25 112   Left +2.25 +1.00 101           IMAGING AND PROCEDURES  Imaging and Procedures for '@TODAY'$ @  OCT, Retina - OU - Both Eyes       Right Eye Quality was good. Central Foveal Thickness: 227. Progression has improved. Findings include normal foveal contour, no IRF, intraretinal hyper-reflective material, subretinal fluid, outer retinal atrophy, vitreomacular adhesion (Interval improvement in shallow SRF, persistent ORA, persistent bullous schisis  caught IT periphery on widefield -- stable from prior).   Left Eye Quality was good. Central Foveal Thickness: 302. Progression has improved. Findings include normal foveal contour, no IRF, no SRF, vitreomacular adhesion (Shallow pocket of SRF nasal disc caught on widefield - improved, +schisis IT quad  caught on widefield).   Notes *Images captured and stored on drive  Diagnosis / Impression:  OD: interval improvement in shallow SRF, persistent ORA, persistent bullous schisis caught IT periphery on widefield -- stable from prior OS: NFP, no IRF/SRF centrally, +VMA; Shallow pocket of SRF nasal disc caught on widefield - improved, +schisis IT quad caught on widefield  Clinical management:  See below  Abbreviations: NFP - Normal foveal profile. CME - cystoid macular edema. PED - pigment epithelial detachment. IRF - intraretinal fluid. SRF - subretinal fluid. EZ - ellipsoid zone. ERM - epiretinal membrane. ORA - outer retinal atrophy. ORT - outer retinal tubulation. SRHM - subretinal hyper-reflective material      Intravitreal Injection, Pharmacologic Agent - OD - Right Eye       Time Out 06/18/2022. 2:12 PM. Confirmed correct patient, procedure, site, and patient consented.   Anesthesia Topical anesthesia was used. Anesthetic medications included Lidocaine 2%, Proparacaine 0.5%.   Procedure Preparation included 5% betadine to ocular surface, eyelid speculum. A (32g) needle was used.   Injection: 2 mg aflibercept 2 MG/0.05ML   Route: Intravitreal, Site: Right Eye   NDC: A3590391, Lot: [id number], Expiration date: 07/11/2023, Waste: 0 mL   Post-op Post injection exam found visual acuity of at least counting fingers. The patient tolerated the procedure well. There were no complications. The patient received written and verbal post procedure care education. Post injection medications were not given.            ASSESSMENT/PLAN:    ICD-10-CM   1. 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 Essential hypertension  I10     5. Hypertensive retinopathy of both eyes  H35.033     6. Combined forms of age-related cataract of both eyes  H25.813      1,2. CSCR OD -- chronic  - pt reports decreased vision OD since December 2019  - reports significant stressors in life -- work  - denies steroid use  - FA (09.30.20) shows focal hyperfluorescent leakage points nasal macula OD -- expansile dot phenotype?  - started 50 mg po eplerenone since 09.30.20  -- pt self d/c in February, re-started in June 2021  - s/p IVA #1 (09.01.21), #2 (09.29.21), #3 (11.03.21), #4 (12.03.21) -- IVA resistance - s/p IVE OD #1 (01.20.22), #2 (02.18.22), #3 (04.05.22-sample), #4 (05.23.22), # 5 (06.27.22), #6 (07.28.22), #7 (08.26.22), #8 (09.23.22), #9 (10.21.22), #10 (11.18.22), #11 (12.16.22), #12 (01.20.23), #13 (03.02.23), #14 (04.07.23), #15 (05.12.23), #16 (06.09.23), #17 (07.14.23), #18 (08.08.23)  - BCVA OD improved to 20/80 from 20/100  - OCT shows OD: interval improvement in shallow SRF, persistent ORA, persistent bullous schisis caught IT periphery on widefield -- stable from prior  - differential includes atypical exudative ARMD  - recommend IVE OD #19 today, 09.08.23 with f/u in 4 weeks  - RBA of procedure discussed, questions answered  - IVA informed consent obtained and signed 09.01.21  - IVE informed consent obtained and signed, 01.20.22  - see procedure note  - [employer] benefits investigation initiated 12.3.21 -- approved for 2023  - continue po eplerenone 50 mg daily  - f/u 4 weeks -- DFE/OCT, possible  injection  3. Retinoschisis OU (OD > OS) -- stable  -  bullous retinoschisis cavity IT quadrant OD-- 7564-3329 oclock  - shallow retinoschisis cavity IT quadrant OS  - no associated retinal holes/RT/RD on scleral depression    - widefield OCT shows stable bullous schisis OD  - discussed findings and prognosis   - recommend observation -- consider laser retinopexy if schisis progresses or threatens macula or develops RD  4,5. Hypertensive retinopathy OU  - discussed importance of tight BP control  - monitor   6. 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  - discussed diagnosis and progression  - not yet visually significant  - monitor for now  Ophthalmic Meds Ordered this visit:  Meds ordered this encounter  Medications   eplerenone (INSPRA) 50 MG tablet    Sig: Take 1 tablet (50 mg total) by mouth daily.    Dispense:  30 tablet    Refill:  3   aflibercept (EYLEA) SOLN 2 mg     Return in about 4 weeks (around 07/16/2022) for f/u exu ARMD OD, DFE, OCT.  There are no Patient Instructions on file for this visit.  This document serves as a record of services personally performed by Gardiner Sleeper, MD, PhD. It was created on their behalf by Renaldo Reel, [place] an ophthalmic technician. The creation of this record is the provider's dictation and/or activities during the visit.    Electronically signed by:  Renaldo Reel, COT  06/04/22 12:45 AM  This document serves as a record of services personally performed by Gardiner Sleeper, MD, PhD. It was created on their behalf by San Jetty. Owens Shark, OA an ophthalmic technician. The creation of this record is the provider's dictation and/or activities during the visit.    Electronically signed by: San Jetty. Owens Shark, [place] 09.08.2023 12:45 AM  Gardiner Sleeper, M.D., Ph.D. Diseases & Surgery of the Retina and Vitreous [facility]  I have reviewed the above documentation for  accuracy and completeness, and I agree with the above. Gardiner Sleeper, M.D., Ph.D. 06/19/22 12:45 AM  Abbreviations: M myopia (nearsighted); A astigmatism; H hyperopia (farsighted); P presbyopia; Mrx spectacle prescription;  CTL contact lenses; OD right eye; OS left eye; OU both eyes  XT exotropia; ET esotropia; PEK punctate epithelial keratitis; PEE punctate epithelial erosions; DES dry eye syndrome; MGD meibomian gland dysfunction; ATs artificial tears; PFAT's preservative free artificial tears; [place] nuclear sclerotic cataract; PSC posterior subcapsular cataract; ERM epi-retinal membrane; PVD posterior vitreous detachment; RD retinal detachment; DM diabetes mellitus; DR diabetic retinopathy; NPDR non-proliferative diabetic retinopathy; PDR proliferative diabetic retinopathy; CSME clinically significant macular edema; DME diabetic macular edema; dbh dot blot hemorrhages; CWS cotton wool spot; POAG primary open angle glaucoma; C/D cup-to-disc ratio; HVF humphrey visual field; GVF goldmann visual field; OCT optical coherence tomography; IOP intraocular pressure; BRVO Branch retinal vein occlusion; CRVO central retinal vein occlusion; CRAO central retinal artery occlusion; BRAO branch retinal artery occlusion; RT retinal tear; SB scleral buckle; PPV pars plana vitrectomy; VH Vitreous hemorrhage; PRP panretinal laser photocoagulation; IVK intravitreal kenalog; VMT vitreomacular traction; MH Macular hole;  NVD neovascularization of the disc; NVE neovascularization elsewhere; AREDS age related eye disease study; ARMD age related macular degeneration; POAG primary open angle glaucoma; EBMD epithelial/anterior basement membrane dystrophy; ACIOL anterior chamber intraocular lens; IOL intraocular lens; PCIOL posterior chamber intraocular lens; Phaco/IOL phacoemulsification with intraocular lens placement; [place]  photorefractive keratectomy; LASIK laser assisted in situ keratomileusis; HTN hypertension; DM diabetes mellitus;  COPD chronic obstructive pulmonary disease

## 2022-06-18 ENCOUNTER — Encounter (INDEPENDENT_AMBULATORY_CARE_PROVIDER_SITE_OTHER): Payer: Self-pay | Admitting: Ophthalmology

## 2022-06-18 ENCOUNTER — Ambulatory Visit (INDEPENDENT_AMBULATORY_CARE_PROVIDER_SITE_OTHER): Payer: Commercial Managed Care - PPO | Admitting: Ophthalmology

## 2022-06-18 DIAGNOSIS — H35033 Hypertensive retinopathy, bilateral: Secondary | ICD-10-CM

## 2022-06-18 DIAGNOSIS — H353211 Exudative age-related macular degeneration, right eye, with active choroidal neovascularization: Secondary | ICD-10-CM

## 2022-06-18 DIAGNOSIS — H35711 Central serous chorioretinopathy, right eye: Secondary | ICD-10-CM | POA: Diagnosis not present

## 2022-06-18 DIAGNOSIS — I10 Essential (primary) hypertension: Secondary | ICD-10-CM | POA: Diagnosis not present

## 2022-06-18 DIAGNOSIS — H33103 Unspecified retinoschisis, bilateral: Secondary | ICD-10-CM

## 2022-06-18 DIAGNOSIS — H25813 Combined forms of age-related cataract, bilateral: Secondary | ICD-10-CM

## 2022-06-18 MED ORDER — EPLERENONE 50 MG PO TABS: 50.0000 mg | ORAL_TABLET | Freq: Every day | ORAL | 3 refills | Status: DC

## 2022-06-18 MED ORDER — AFLIBERCEPT 2MG/0.05ML IZ SOLN FOR KALEIDOSCOPE
2.0000 mg | INTRAVITREAL | Status: AC | PRN
  Administered 2022-06-18: 2 mg via INTRAVITREAL

## 2022-07-02 NOTE — Progress Notes (Signed)
[facility] Note  07/16/2022     CHIEF COMPLAINT Patient presents for Retina Follow Up   HISTORY OF PRESENT ILLNESS: [name] is a 51 y.o. female who presents to the clinic today for:  HPI     Retina Follow Up   Patient presents with  Other.  In right eye.  Severity is moderate.  Duration of 4 weeks.  Since onset it is stable.  I, the attending physician,  performed the HPI with the patient and updated documentation appropriately.        Comments   Pt here for 4 wk ret f/u ARMD OD. Pt states VA is stable, no changes.       Last edited by [name], MD on 07/16/2022  3:35 PM.     Pt states vision is the same  Referring physician: No referring provider defined for this encounter.  HISTORICAL INFORMATION:   Selected notes from the MEDICAL RECORD NUMBER Referred by [name] for concern of sub-macular fluid / retinoschisis   CURRENT MEDICATIONS: No current outpatient medications on file. (Ophthalmic Drugs)   No current facility-administered medications for this visit. (Ophthalmic Drugs)   Current Outpatient Medications (Other)  Medication Sig   diphenhydrAMINE HCl (BENADRYL ALLERGY PO) Take by mouth.   eplerenone (INSPRA) 50 MG tablet Take 1 tablet (50 mg total) by mouth daily.   fexofenadine (ALLEGRA) 180 MG tablet Take 180 mg by mouth daily.   hydrochlorothiazide (HYDRODIURIL) 25 MG tablet TAKE 1 TABLET BY MOUTH ONCE DAILY IN THE MORNING FOR 90 DAYS   lisinopril (PRINIVIL,ZESTRIL) 20 MG tablet Take 1 tablet (20 mg total) by mouth daily.   No current facility-administered medications for this visit. (Other)   REVIEW OF SYSTEMS: ROS   Positive for: Cardiovascular, Eyes Negative for: Constitutional, Gastrointestinal, Neurological, Skin, Genitourinary, Musculoskeletal, HENT, Endocrine, Respiratory, Psychiatric, Allergic/Imm, Heme/Lymph Last edited by Kingsley Spittle, COT on 07/16/2022  1:26 PM.       ALLERGIES Allergies  Allergen Reactions   Pollen Extract Itching   PAST MEDICAL HISTORY Past Medical History:  Diagnosis Date   Abnormal Pap smear of cervix    12-28-21 lgsil HPV HR +   Cataract    Mixed form OU   CIN I (cervical intraepithelial neoplasia I)    Depression    High risk HPV infection    Hypertension    Hypertensive retinopathy    OU   Missed ab AGUST 6 2014   Retina disorder 10/22/2019   pt states taking inspra for excess fluid in retina   Sciatic nerve injury    Past Surgical History:  Procedure Laterality Date   MYOMECTOMY  2008   FAMILY HISTORY Family History  Problem Relation Age of Onset   Hypertension Mother    Diabetes Father    Diabetes Maternal Grandmother    Hypertension Maternal Grandmother    Colon cancer Neg Hx    Colon polyps Neg Hx    Esophageal cancer Neg Hx    Rectal cancer Neg Hx    Stomach cancer Neg Hx    SOCIAL HISTORY Social History   Tobacco Use   Smoking status: Never   Smokeless tobacco: Never  Vaping Use   Vaping Use: Never used  Substance Use Topics   Alcohol use: No    Alcohol/week: 0.0 standard drinks of alcohol   Drug use: No       OPHTHALMIC EXAM: Base Eye Exam  Visual Acuity (Snellen - Linear)       Right Left   Dist cc 20/100 -2 20/25 -2   Dist ph cc 20/80 -2 NI    Correction: Glasses         Tonometry (Tonopen, 1:31 PM)       Right Left   Pressure 11 10         Pupils       Pupils Dark Light Shape React APD   Right PERRL 5 4 Round Brisk None   Left PERRL 5 4 Round Brisk None         Visual Fields (Counting fingers)       Left Right    Full Full         Extraocular Movement       Right Left    Full, Ortho Full, Ortho         Neuro/Psych     Oriented x3: Yes   Mood/Affect: Normal         Dilation     Both eyes: 1.0% Mydriacyl, 2.5% Phenylephrine @ 1:31 PM           Slit Lamp and Fundus Exam     Slit Lamp Exam       Right Left   Lids/Lashes  Dermatochalasis - upper lid Dermatochalasis - upper lid   Conjunctiva/Sclera Injection, nasal pterygium 1+ Injection, nasal Pinguecula   Cornea Nasal ptergyium extending 2.26m onto nasal cornea 1+Punctate epithelial erosions   Anterior Chamber Deep, clear, narrow temporal angle, No cell or flare deep, narrow temporal angle, 1+pigment   Iris Round and dilated Round and dilated   Lens 2+ Nuclear sclerosis, 2+ Cortical cataract 2+ Nuclear sclerosis, 2+ Cortical cataract   Anterior Vitreous Mild Vitreous syneresis, Posterior vitreous detachment, vitreous condensations Mild Vitreous syneresis         Fundus Exam       Right Left   Disc Pink and Sharp, +cupping Pink and Sharp, +cupping, shallow peripapillary SRF just nasal to disc - improved   C/D Ratio 0.65 0.6   Macula Flat, Blunted foveal reflex, central atrophy with re-development of focal SRF centrally, RPE mottling, clumping and atrophy, No heme Flat, good foveal reflex, Epiretinal membrane, mild RPE mottling and clumping superiorly, No heme or edema   Vessels attenuated, Tortuous Vascular attenuation, mild Tortuousity   Periphery Attached, bullous Retinoschisis cavity from 0700-0900 -- no associated RT/RD - stable from prior, shallow schisis cavity ST, mild peripheral cystoid degeneration Attached, temporal retinoschisis cavity from 0330-0430 peripherally -- stable from prior           Refraction     Wearing Rx       Sphere Cylinder Axis   Right +1.25 +1.25 112   Left +2.25 +1.00 101           IMAGING AND PROCEDURES  Imaging and Procedures for _0 @  OCT, Retina - OU - Both Eyes       Right Eye Quality was good. Central Foveal Thickness: 266. Progression has worsened. Findings include normal foveal contour, no IRF, intraretinal hyper-reflective material, subretinal fluid, outer retinal atrophy, vitreomacular adhesion (Interval re-development of focal SRF centrally, persistent ORA, persistent bullous schisis caught IT  periphery on widefield -- stable from prior).   Left Eye Quality was good. Central Foveal Thickness: 301. Progression has been stable. Findings include normal foveal contour, no IRF, no SRF, vitreomacular adhesion (Shallow pocket of SRF nasal disc caught on widefield - improved, +schisis IT quad  caught on widefield).   Notes *Images captured and stored on drive  Diagnosis / Impression:  OD: Interval re-development of focal SRF centrally, persistent ORA, persistent bullous schisis caught IT periphery on widefield -- stable from prior OS: NFP, no IRF/SRF centrally, +VMA; Shallow pocket of SRF nasal disc caught on widefield - improved, +schisis IT quad caught on widefield  Clinical management:  See below  Abbreviations: NFP - Normal foveal profile. CME - cystoid macular edema. PED - pigment epithelial detachment. IRF - intraretinal fluid. SRF - subretinal fluid. EZ - ellipsoid zone. ERM - epiretinal membrane. ORA - outer retinal atrophy. ORT - outer retinal tubulation. SRHM - subretinal hyper-reflective material      Intravitreal Injection, Pharmacologic Agent - OD - Right Eye       Time Out 07/16/2022. 1:36 PM. Confirmed correct patient, procedure, site, and patient consented.   Anesthesia Topical anesthesia was used. Anesthetic medications included Lidocaine 2%, Proparacaine 0.5%.   Procedure Preparation included 5% betadine to ocular surface, eyelid speculum. A (32g) needle was used.   Injection: 2 mg aflibercept 2 MG/0.05ML   Route: Intravitreal, Site: Right Eye   NDC: A3590391, Lot: [id number], Expiration date: 11/11/2023, Waste: 0 mL   Post-op Post injection exam found visual acuity of at least counting fingers. The patient tolerated the procedure well. There were no complications. The patient received written and verbal post procedure care education. Post injection medications were not given.            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 Essential hypertension  I10     5. Hypertensive retinopathy of both eyes  H35.033     6. Combined forms of age-related cataract of both eyes  H25.813       1,2. CSCR OD -- chronic  - pt reports decreased vision OD since December 2019  - reports significant stressors in life -- work  - denies steroid use  - FA (09.30.20) shows focal hyperfluorescent leakage points nasal macula OD -- expansile dot phenotype?  - started 50 mg po eplerenone since 09.30.20  -- pt self d/c in February, re-started in June 2021  - s/p IVA #1 (09.01.21), #2 (09.29.21), #3 (11.03.21), #4 (12.03.21) -- IVA resistance - s/p IVE OD #1 (01.20.22), #2 (02.18.22), #3 (04.05.22-sample), #4 (05.23.22), # 5 (06.27.22), #6 (07.28.22), #7 (08.26.22), #8 (09.23.22), #9 (10.21.22), #10 (11.18.22), #11 (12.16.22), #12 (01.20.23), #13 (03.02.23), #14 (04.07.23), #15 (05.12.23), #16 (06.09.23), #17 (07.14.23), #18 (08.08.23), #19 (09.08.23)  - BCVA OD stable at 20/80  - OCT OD shows Interval re-development of focal SRF centrally, persistent ORA, persistent bullous schisis caught IT periphery on widefield -- stable from prior  - differential includes atypical exudative ARMD  - recommend IVE OD #20 today, 10.06.23 with f/u in 4 weeks  - RBA of procedure discussed, questions answered  - IVA informed consent obtained and signed 09.01.21  - IVE informed consent obtained and signed, 01.20.22  - see  procedure note  - [employer]yle[employer] benefits investigation initiated 12.3.21 -- approved for  2023  - continue po eplerenone 50 mg daily  - f/u 4 weeks -- DFE/OCT, possible injection  3. Retinoschisis OU (OD > OS) -- stable  - bullous retinoschisis cavity IT quadrant OD-- 2703-5009 oclock  - shallow retinoschisis cavity IT quadrant OS  - no associated retinal holes/RT/RD on scleral depression    - widefield OCT shows stable bullous schisis OD -- stsable  - discussed findings and prognosis   - recommend observation -- consider laser retinopexy if schisis progresses or threatens macula or develops RD  4,5. Hypertensive retinopathy OU  - discussed importance of tight BP control  - monitor   6. 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  - discussed diagnosis and progression  - not yet visually significant  - monitor for now  Ophthalmic Meds Ordered this visit:  Meds ordered this encounter  Medications   aflibercept (EYLEA) SOLN 2 mg     Return in about 4 weeks (around 08/13/2022) for f/u exu ARMD OD, DFE, OCT.  There are no Patient Instructions on file for this visit.  This document serves as a record of services personally performed by Gardiner Sleeper, MD, PhD. It was created on their behalf by Renaldo Reel, [place] an ophthalmic technician. The creation of this record is the provider's dictation and/or activities during the visit.    Electronically signed by:  Renaldo Reel, COT  09.22.23 3:37 PM  This document serves as a record of services personally performed by Gardiner Sleeper, MD, PhD. It was created on their behalf by San Jetty. Owens Shark, OA an ophthalmic technician. The creation of this record is the provider's dictation and/or activities during the visit.    Electronically signed by: San Jetty. Owens Shark, [place] 10.06.2023 3:37 PM   Gardiner Sleeper, M.D., Ph.D. Diseases & Surgery of the Retina and Vitreous [facility]  I have reviewed the above  documentation for accuracy and completeness, and I agree with the above. Gardiner Sleeper, M.D., Ph.D. 07/16/22 3:38 PM  Abbreviations: M myopia (nearsighted); A astigmatism; H hyperopia (farsighted); P presbyopia; Mrx spectacle prescription;  CTL contact lenses; OD right eye; OS left eye; OU both eyes  XT exotropia; ET esotropia; PEK punctate epithelial keratitis; PEE punctate epithelial erosions; DES dry eye syndrome; MGD meibomian gland dysfunction; ATs artificial tears; PFAT's preservative free artificial tears; [place] nuclear sclerotic cataract; PSC posterior subcapsular cataract; ERM epi-retinal membrane; PVD posterior vitreous detachment; RD retinal detachment; DM diabetes mellitus; DR diabetic retinopathy; NPDR non-proliferative diabetic retinopathy; PDR proliferative diabetic retinopathy; CSME clinically significant macular edema; DME diabetic macular edema; dbh dot blot hemorrhages; CWS cotton wool spot; POAG primary open angle glaucoma; C/D cup-to-disc ratio; HVF humphrey visual field; GVF goldmann visual field; OCT optical coherence tomography; IOP intraocular pressure; BRVO Branch retinal vein occlusion; CRVO central retinal vein occlusion; CRAO central retinal artery occlusion; BRAO branch retinal artery occlusion; RT retinal tear; SB scleral buckle; PPV pars plana vitrectomy; VH Vitreous hemorrhage; PRP panretinal laser photocoagulation; IVK intravitreal kenalog; VMT vitreomacular traction; MH Macular hole;  NVD neovascularization of the disc; NVE neovascularization elsewhere; AREDS age related eye disease study; ARMD age related macular degeneration; POAG primary open angle glaucoma; EBMD epithelial/anterior basement membrane dystrophy; ACIOL anterior chamber intraocular lens; IOL intraocular lens; PCIOL posterior chamber intraocular lens; Phaco/IOL phacoemulsification with intraocular lens placement; PRK photorefractive  keratectomy; LASIK laser assisted in situ keratomileusis; HTN hypertension; DM  diabetes mellitus; COPD chronic obstructive pulmonary disease

## 2022-07-16 ENCOUNTER — Ambulatory Visit (INDEPENDENT_AMBULATORY_CARE_PROVIDER_SITE_OTHER): Payer: Commercial Managed Care - PPO | Admitting: Ophthalmology

## 2022-07-16 ENCOUNTER — Encounter (INDEPENDENT_AMBULATORY_CARE_PROVIDER_SITE_OTHER): Payer: Self-pay | Admitting: Ophthalmology

## 2022-07-16 DIAGNOSIS — H353211 Exudative age-related macular degeneration, right eye, with active choroidal neovascularization: Secondary | ICD-10-CM | POA: Diagnosis not present

## 2022-07-16 DIAGNOSIS — H35033 Hypertensive retinopathy, bilateral: Secondary | ICD-10-CM

## 2022-07-16 DIAGNOSIS — H33103 Unspecified retinoschisis, bilateral: Secondary | ICD-10-CM | POA: Diagnosis not present

## 2022-07-16 DIAGNOSIS — H35711 Central serous chorioretinopathy, right eye: Secondary | ICD-10-CM | POA: Diagnosis not present

## 2022-07-16 DIAGNOSIS — I10 Essential (primary) hypertension: Secondary | ICD-10-CM | POA: Diagnosis not present

## 2022-07-16 DIAGNOSIS — H25813 Combined forms of age-related cataract, bilateral: Secondary | ICD-10-CM

## 2022-07-16 MED ORDER — AFLIBERCEPT 2MG/0.05ML IZ SOLN FOR KALEIDOSCOPE
2.0000 mg | INTRAVITREAL | Status: AC | PRN
  Administered 2022-07-16: 2 mg via INTRAVITREAL

## 2022-08-13 NOTE — Progress Notes (Signed)
[facility] Note  08/16/2022     CHIEF COMPLAINT Patient presents for Retina Follow Up   HISTORY OF PRESENT ILLNESS: [name] is a 51 y.o. female who presents to the clinic today for:  HPI     Retina Follow Up   Patient presents with  Wet AMD.  In right eye.  This started 4 weeks ago.  I, the attending physician,  performed the HPI with the patient and updated documentation appropriately.        Comments   Patient here for 4 weeks retina follow up for exu ARMD OD. Patient states vision doing good. No eye pain.       Last edited by [name], MD on 08/16/2022  3:45 PM.     Pt states vision is the same  Referring physician: No referring provider defined for this encounter.  HISTORICAL INFORMATION:   Selected notes from the MEDICAL RECORD NUMBER Referred by [name] for concern of sub-macular fluid / retinoschisis   CURRENT MEDICATIONS: No current outpatient medications on file. (Ophthalmic Drugs)   No current facility-administered medications for this visit. (Ophthalmic Drugs)   Current Outpatient Medications (Other)  Medication Sig   diphenhydrAMINE HCl (BENADRYL ALLERGY PO) Take by mouth.   fexofenadine (ALLEGRA) 180 MG tablet Take 180 mg by mouth daily.   hydrochlorothiazide (HYDRODIURIL) 25 MG tablet TAKE 1 TABLET BY MOUTH ONCE DAILY IN THE MORNING FOR 90 DAYS   lisinopril (PRINIVIL,ZESTRIL) 20 MG tablet Take 1 tablet (20 mg total) by mouth daily.   eplerenone (INSPRA) 50 MG tablet Take 1 tablet (50 mg total) by mouth daily.   No current facility-administered medications for this visit. (Other)   REVIEW OF SYSTEMS: ROS   Positive for: Cardiovascular, Eyes Negative for: Constitutional, Gastrointestinal, Neurological, Skin, Genitourinary, Musculoskeletal, HENT, Endocrine, Respiratory, Psychiatric, Allergic/Imm, Heme/Lymph Last edited by [name], COA on 08/16/2022  2:40 PM.      ALLERGIES Allergies  Allergen Reactions   Pollen Extract Itching   PAST MEDICAL HISTORY Past Medical History:  Diagnosis Date   Abnormal Pap smear of cervix    12-28-21 lgsil HPV HR +   Cataract    Mixed form OU   CIN I (cervical intraepithelial neoplasia I)    Depression    High risk HPV infection    Hypertension    Hypertensive retinopathy    OU   Missed ab AGUST 6 2014   Retina disorder 10/22/2019   pt states taking inspra for excess fluid in retina   Sciatic nerve injury    Past Surgical History:  Procedure Laterality Date   MYOMECTOMY  2008   FAMILY HISTORY Family History  Problem Relation Age of Onset   Hypertension Mother    Diabetes Father    Diabetes Maternal Grandmother    Hypertension Maternal Grandmother    Colon cancer Neg Hx    Colon polyps Neg Hx    Esophageal cancer Neg Hx    Rectal cancer Neg Hx    Stomach cancer Neg Hx    SOCIAL HISTORY Social History   Tobacco Use   Smoking status: Never   Smokeless tobacco: Never  Vaping Use   Vaping Use: Never used  Substance Use Topics   Alcohol use: No    Alcohol/week: 0.0 standard drinks of alcohol   Drug use: No       OPHTHALMIC EXAM: Base Eye Exam     Visual Acuity (Snellen - Linear)  Right Left   Dist cc 20/100 -2 20/25 -2   Dist ph cc 20/80 -2 20/20 -2    Correction: Glasses         Tonometry (Tonopen, 2:37 PM)       Right Left   Pressure 15 14         Pupils       Dark Light Shape React APD   Right 5 4 Round Brisk None   Left 5 4 Round Brisk None         Visual Fields (Counting fingers)       Left Right    Full Full         Extraocular Movement       Right Left    Full, Ortho Full, Ortho         Neuro/Psych     Oriented x3: Yes   Mood/Affect: Normal         Dilation     Both eyes: 1.0% Mydriacyl, 2.5% Phenylephrine @ 2:37 PM           Slit Lamp and Fundus Exam     Slit Lamp Exam       Right Left   Lids/Lashes Dermatochalasis  - upper lid Dermatochalasis - upper lid   Conjunctiva/Sclera Injection, nasal pterygium 1+ Injection, nasal Pinguecula   Cornea Nasal ptergyium extending 2.64m onto nasal cornea 1+Punctate epithelial erosions   Anterior Chamber Deep, clear, narrow temporal angle, No cell or flare deep, narrow temporal angle, 1+pigment   Iris Round and dilated Round and dilated   Lens 2+ Nuclear sclerosis, 2+ Cortical cataract 2+ Nuclear sclerosis, 2+ Cortical cataract   Anterior Vitreous Mild Vitreous syneresis, Posterior vitreous detachment, vitreous condensations Mild Vitreous syneresis         Fundus Exam       Right Left   Disc Pink and Sharp, +cupping Pink and Sharp, +cupping, shallow peripapillary SRF just nasal to disc - improved   C/D Ratio 0.65 0.6   Macula Flat, Blunted foveal reflex, central atrophy with persistent focal SRF centrally, RPE mottling, clumping and atrophy, No heme Flat, good foveal reflex, Epiretinal membrane, mild RPE mottling and clumping superiorly, No heme or edema   Vessels mild attenuation, mild tortuosity Vascular attenuation, mild Tortuousity   Periphery Attached, bullous Retinoschisis cavity from 0700-0900 -- no associated RT/RD - stable from prior, shallow schisis cavity ST, mild peripheral cystoid degeneration Attached, temporal retinoschisis cavity from 0330-0430 peripherally -- stable from prior           Refraction     Wearing Rx       Sphere Cylinder Axis   Right +1.25 +1.25 112   Left +2.25 +1.00 101           IMAGING AND PROCEDURES  Imaging and Procedures for '@TODAY'$ @  OCT, Retina - OU - Both Eyes       Right Eye Quality was good. Central Foveal Thickness: 284. Progression has been stable. Findings include normal foveal contour, no IRF, intraretinal hyper-reflective material, subretinal fluid, outer retinal atrophy, vitreomacular adhesion (Persistent SRF centrally, persistent ORA, persistent bullous schisis caught IT periphery on widefield --  stable from prior).   Left Eye Quality was good. Central Foveal Thickness: 298. Progression has been stable. Findings include normal foveal contour, no IRF, no SRF, vitreomacular adhesion (Shallow pocket of SRF nasal disc caught on widefield - improved, +schisis IT quad caught on widefield).   Notes *Images captured and stored on drive  Diagnosis /  Impression:  OD: persistent SRF centrally, persistent ORA, persistent bullous schisis caught IT periphery on widefield -- stable from prior OS: NFP, no IRF/SRF centrally, +VMA; Shallow pocket of SRF nasal disc caught on widefield - improved, +schisis IT quad caught on widefield  Clinical management:  See below  Abbreviations: NFP - Normal foveal profile. CME - cystoid macular edema. PED - pigment epithelial detachment. IRF - intraretinal fluid. SRF - subretinal fluid. EZ - ellipsoid zone. ERM - epiretinal membrane. ORA - outer retinal atrophy. ORT - outer retinal tubulation. SRHM - subretinal hyper-reflective material      Intravitreal Injection, Pharmacologic Agent - OD - Right Eye       Time Out 08/16/2022. 3:11 PM. Confirmed correct patient, procedure, site, and patient consented.   Anesthesia Topical anesthesia was used. Anesthetic medications included Lidocaine 2%, Proparacaine 0.5%.   Procedure Preparation included 5% betadine to ocular surface, eyelid speculum. A (32g) needle was used.   Injection: 2 mg aflibercept 2 MG/0.05ML   Route: Intravitreal, Site: Right Eye   NDC: A3590391, Lot: [id number], Expiration date: 03/11/2023, Waste: 0 mL   Post-op Post injection exam found visual acuity of at least counting fingers. The patient tolerated the procedure well. There were no complications. The patient received written and verbal post procedure care education. Post injection medications were not given.            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 Exudative age-related macular degeneration of right eye with active choroidal neovascularization (HCC)  H35.3211 OCT, Retina - OU - Both Eyes    Intravitreal Injection, Pharmacologic Agent - OD - Right Eye    aflibercept (EYLEA) SOLN 2 mg    3. Bilateral retinoschisis  H33.103 OCT, Retina - OU - Both Eyes    4. Essential hypertension  I10     5. Hypertensive retinopathy of both eyes  H35.033     6. Combined forms of age-related cataract of both eyes  H25.813        1,2. CSCR OD -- chronic  - pt reports decreased vision OD since December 2019  - reports significant stressors in life -- work  - denies steroid use  - FA (09.30.20) shows focal hyperfluorescent leakage points nasal macula OD -- expansile dot phenotype?  - started 50 mg po eplerenone since 09.30.20  -- pt self d/c in February, re-started in June 2021  - s/p IVA #1 (09.01.21), #2 (09.29.21), #3 (11.03.21), #4 (12.03.21) -- IVA resistance - s/p IVE OD #1 (01.20.22), #2 (02.18.22), #3 (04.05.22-sample), #4 (05.23.22), # 5 (06.27.22), #6 (07.28.22), #7 (08.26.22), #8 (09.23.22), #9 (10.21.22), #10 (11.18.22), #11 (12.16.22), #12 (01.20.23), #13 (03.02.23), #14 (04.07.23), #15 (05.12.23), #16 (06.09.23), #17 (07.14.23), #18 (08.08.23), #19 (09.08.23), #20 (10.06.23)  - BCVA OD stable at 20/80  - OCT OD shows persistent SRF centrally, persistent ORA, persistent bullous schisis caught IT periphery on widefield -- stable from prior  - differential includes atypical exudative ARMD  - recommend IVE OD #21 today, 11.06.23 with f/u in 4 weeks  - RBA of procedure discussed, questions answered  - IVA informed consent obtained and signed 09.01.21  - IVE informed consent obtained and signed, 01.20.22  - see procedure note  - Eylea4U benefits investigation initiated 12.3.21 -- approved for 2023  - continue po eplerenone 50 mg daily  - f/u 4 weeks -- DFE/OCT, possible injection  3.  Retinoschisis OU (OD > OS) --  stable  - bullous retinoschisis cavity IT quadrant OD-- 5277-8242 oclock  - shallow retinoschisis cavity IT quadrant OS  - no associated retinal holes/RT/RD on scleral depression    - widefield OCT shows stable bullous schisis OD -- stsable  - discussed findings and prognosis   - recommend observation--will consider laser if retinopexy progresses or develops into RD  4,5. Hypertensive retinopathy OU  - discussed importance of tight BP control  - monitor  6. 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  - discussed diagnosis and progression  - monitor  Ophthalmic Meds Ordered this visit:  Meds ordered this encounter  Medications   aflibercept (EYLEA) SOLN 2 mg     Return in about 4 weeks (around 09/13/2022) for f/u exu ARMD OD, DFE, OCT.  There are no Patient Instructions on file for this visit.  This document serves as a record of services personally performed by Gardiner Sleeper, MD, PhD. It was created on their behalf by Roselee Nova, COMT. The creation of this record is the provider's dictation and/or activities during the visit.  Electronically signed by: Roselee Nova, COMT 08/16/22 3:47 PM  This document serves as a record of services personally performed by Gardiner Sleeper, MD, PhD. It was created on their behalf by San Jetty. Owens Shark, OA an ophthalmic technician. The creation of this record is the provider's dictation and/or activities during the visit.    Electronically signed by: San Jetty. Owens Shark, [place] 11.06.2023 3:47 PM  Gardiner Sleeper, M.D., Ph.D. Diseases & Surgery of the Retina and Vitreous [facility]  I have reviewed the above documentation for accuracy and completeness, and I agree with the above. Gardiner Sleeper, M.D., Ph.D. 08/16/22 3:47 PM   Abbreviations: M myopia (nearsighted); A astigmatism; H hyperopia (farsighted); P presbyopia; Mrx spectacle  prescription;  CTL contact lenses; OD right eye; OS left eye; OU both eyes  XT exotropia; ET esotropia; PEK punctate epithelial keratitis; PEE punctate epithelial erosions; DES dry eye syndrome; MGD meibomian gland dysfunction; ATs artificial tears; PFAT's preservative free artificial tears; [place] nuclear sclerotic cataract; PSC posterior subcapsular cataract; ERM epi-retinal membrane; PVD posterior vitreous detachment; RD retinal detachment; DM diabetes mellitus; DR diabetic retinopathy; NPDR non-proliferative diabetic retinopathy; PDR proliferative diabetic retinopathy; CSME clinically significant macular edema; DME diabetic macular edema; dbh dot blot hemorrhages; CWS cotton wool spot; POAG primary open angle glaucoma; C/D cup-to-disc ratio; HVF humphrey visual field; GVF goldmann visual field; OCT optical coherence tomography; IOP intraocular pressure; BRVO Branch retinal vein occlusion; CRVO central retinal vein occlusion; CRAO central retinal artery occlusion; BRAO branch retinal artery occlusion; RT retinal tear; SB scleral buckle; PPV pars plana vitrectomy; VH Vitreous hemorrhage; PRP panretinal laser photocoagulation; IVK intravitreal kenalog; VMT vitreomacular traction; MH Macular hole;  NVD neovascularization of the disc; NVE neovascularization elsewhere; AREDS age related eye disease study; ARMD age related macular degeneration; POAG primary open angle glaucoma; EBMD epithelial/anterior basement membrane dystrophy; ACIOL anterior chamber intraocular lens; IOL intraocular lens; PCIOL posterior chamber intraocular lens; Phaco/IOL phacoemulsification with intraocular lens placement; [place] photorefractive keratectomy; LASIK laser assisted in situ keratomileusis; HTN hypertension; DM diabetes mellitus; COPD chronic obstructive pulmonary disease

## 2022-08-16 ENCOUNTER — Ambulatory Visit (INDEPENDENT_AMBULATORY_CARE_PROVIDER_SITE_OTHER): Payer: Commercial Managed Care - PPO | Admitting: Ophthalmology

## 2022-08-16 ENCOUNTER — Encounter (INDEPENDENT_AMBULATORY_CARE_PROVIDER_SITE_OTHER): Payer: Self-pay | Admitting: Ophthalmology

## 2022-08-16 DIAGNOSIS — H25813 Combined forms of age-related cataract, bilateral: Secondary | ICD-10-CM

## 2022-08-16 DIAGNOSIS — H353211 Exudative age-related macular degeneration, right eye, with active choroidal neovascularization: Secondary | ICD-10-CM

## 2022-08-16 DIAGNOSIS — I10 Essential (primary) hypertension: Secondary | ICD-10-CM | POA: Diagnosis not present

## 2022-08-16 DIAGNOSIS — H35711 Central serous chorioretinopathy, right eye: Secondary | ICD-10-CM

## 2022-08-16 DIAGNOSIS — H35033 Hypertensive retinopathy, bilateral: Secondary | ICD-10-CM

## 2022-08-16 DIAGNOSIS — H33103 Unspecified retinoschisis, bilateral: Secondary | ICD-10-CM

## 2022-08-16 MED ORDER — AFLIBERCEPT 2MG/0.05ML IZ SOLN FOR KALEIDOSCOPE
2.0000 mg | INTRAVITREAL | Status: AC | PRN
  Administered 2022-08-16: 2 mg via INTRAVITREAL

## 2022-09-10 NOTE — Progress Notes (Signed)
[facility] Note  09/13/2022     CHIEF COMPLAINT Patient presents for Retina Follow Up   HISTORY OF PRESENT ILLNESS: [name] is a 51 y.o. female who presents to the clinic today for:  HPI     Retina Follow Up   Patient presents with  Other.  In right eye.  Severity is moderate.  Duration of 4 weeks.  Since onset it is stable.  I, the attending physician,  performed the HPI with the patient and updated documentation appropriately.        Comments   Pt here for 4 wk ret f/u CSC OD. Pt states VA is the same.       Last edited by [name], MD on 09/13/2022  5:35 PM.    Pt states vision is the same, she just took a week vacation from work, pt got a new glasses rx from Dr. Jerline Pain  Referring physician: No referring provider defined for this encounter.  HISTORICAL INFORMATION:   Selected notes from the MEDICAL RECORD NUMBER Referred by [name] for concern of sub-macular fluid / retinoschisis   CURRENT MEDICATIONS: No current outpatient medications on file. (Ophthalmic Drugs)   No current facility-administered medications for this visit. (Ophthalmic Drugs)   Current Outpatient Medications (Other)  Medication Sig   diphenhydrAMINE HCl (BENADRYL ALLERGY PO) Take by mouth.   fexofenadine (ALLEGRA) 180 MG tablet Take 180 mg by mouth daily.   hydrochlorothiazide (HYDRODIURIL) 25 MG tablet TAKE 1 TABLET BY MOUTH ONCE DAILY IN THE MORNING FOR 90 DAYS   lisinopril (PRINIVIL,ZESTRIL) 20 MG tablet Take 1 tablet (20 mg total) by mouth daily.   eplerenone (INSPRA) 50 MG tablet Take 1 tablet (50 mg total) by mouth daily.   No current facility-administered medications for this visit. (Other)   REVIEW OF SYSTEMS: ROS   Positive for: Cardiovascular, Eyes Negative for: Constitutional, Gastrointestinal, Neurological, Skin, Genitourinary, Musculoskeletal, HENT, Endocrine, Respiratory, Psychiatric, Allergic/Imm, Heme/Lymph Last  edited by Kingsley Spittle, COT on 09/13/2022  2:29 PM.     ALLERGIES Allergies  Allergen Reactions   Pollen Extract Itching   PAST MEDICAL HISTORY Past Medical History:  Diagnosis Date   Abnormal Pap smear of cervix    12-28-21 lgsil HPV HR +   Cataract    Mixed form OU   CIN I (cervical intraepithelial neoplasia I)    Depression    High risk HPV infection    Hypertension    Hypertensive retinopathy    OU   Missed ab AGUST 6 2014   Retina disorder 10/22/2019   pt states taking inspra for excess fluid in retina   Sciatic nerve injury    Past Surgical History:  Procedure Laterality Date   MYOMECTOMY  2008   FAMILY HISTORY Family History  Problem Relation Age of Onset   Hypertension Mother    Diabetes Father    Diabetes Maternal Grandmother    Hypertension Maternal Grandmother    Colon cancer Neg Hx    Colon polyps Neg Hx    Esophageal cancer Neg Hx    Rectal cancer Neg Hx    Stomach cancer Neg Hx    SOCIAL HISTORY Social History   Tobacco Use   Smoking status: Never   Smokeless tobacco: Never  Vaping Use   Vaping Use: Never used  Substance Use Topics   Alcohol use: No    Alcohol/week: 0.0 standard drinks of alcohol   Drug use: No  OPHTHALMIC EXAM: Base Eye Exam     Visual Acuity (Snellen - Linear)       Right Left   Dist cc 20/80 -2 20/25 -1   Dist ph cc 20/70 -1 20/20 -2    Correction: Glasses         Tonometry (Tonopen, 2:35 PM)       Right Left   Pressure 13 13         Pupils       Dark Light Shape React APD   Right 5 4 Roundq Brisk None   Left 5 4 Roundq Brisk None         Visual Fields (Counting fingers)       Left Right    Full Full         Extraocular Movement       Right Left    Full, Ortho Full, Ortho         Neuro/Psych     Oriented x3: Yes   Mood/Affect: Normal         Dilation     Both eyes: 1.0% Mydriacyl, 2.5% Phenylephrine @ 2:36 PM           Slit Lamp and Fundus Exam      Slit Lamp Exam       Right Left   Lids/Lashes Dermatochalasis - upper lid Dermatochalasis - upper lid   Conjunctiva/Sclera Injection, nasal pterygium with +vessels 1+ Injection, nasal Pinguecula   Cornea Nasal ptergyium extending 2.33m onto nasal cornea 1+Punctate epithelial erosions   Anterior Chamber Deep, clear, narrow temporal angle, No cell or flare deep, narrow temporal angle, 1+pigment   Iris Round and dilated Round and dilated   Lens 2+ Nuclear sclerosis, 2+ Cortical cataract 2+ Nuclear sclerosis, 2+ Cortical cataract   Anterior Vitreous Mild Vitreous syneresis, Posterior vitreous detachment, vitreous condensations Mild Vitreous syneresis         Fundus Exam       Right Left   Disc Pink and Sharp, +cupping Pink and Sharp, +cupping, shallow peripapillary SRF just nasal to disc - improved   C/D Ratio 0.65 0.6   Macula Flat, Blunted foveal reflex, central atrophy with interval improvement in SRF centrally, RPE mottling, clumping and atrophy, No heme Flat, good foveal reflex, Epiretinal membrane, mild RPE mottling and clumping superiorly, No heme or edema   Vessels mild attenuation, mild tortuosity Vascular attenuation, mild Tortuousity   Periphery Attached, bullous Retinoschisis cavity from 0700-0900 -- no associated RT/RD - stable from prior, shallow schisis cavity ST, mild peripheral cystoid degeneration Attached, temporal retinoschisis cavity from 0330-0430 peripherally -- stable from prior           Refraction     Wearing Rx       Sphere Cylinder Axis   Right +1.25 +1.25 112   Left +2.25 +1.00 101           IMAGING AND PROCEDURES  Imaging and Procedures for _0 @  OCT, Retina - OU - Both Eyes       Right Eye Quality was good. Central Foveal Thickness: 233. Progression has improved. Findings include normal foveal contour, no IRF, no SRF, intraretinal hyper-reflective material, outer retinal atrophy, vitreomacular adhesion (Interval improvement in foveal  contour, Interval resolution of SRF centrally, persistent ORA, persistent bullous schisis caught IT periphery on widefield -- stable from prior).   Left Eye Quality was good. Central Foveal Thickness: 302. Progression has been stable. Findings include normal foveal contour, no IRF, no SRF, vitreomacular  adhesion (Shallow pocket of SRF nasal disc caught on widefield - improved, +schisis IT quad caught on widefield).   Notes *Images captured and stored on drive  Diagnosis / Impression:  OD: Interval improvement in foveal contour, Interval resolution of SRF centrally, persistent ORA, persistent bullous schisis caught IT periphery on widefield -- stable from prior OS: NFP, no IRF/SRF centrally, +VMA; Shallow pocket of SRF nasal disc caught on widefield - improved, +schisis IT quad caught on widefield  Clinical management:  See below  Abbreviations: NFP - Normal foveal profile. CME - cystoid macular edema. PED - pigment epithelial detachment. IRF - intraretinal fluid. SRF - subretinal fluid. EZ - ellipsoid zone. ERM - epiretinal membrane. ORA - outer retinal atrophy. ORT - outer retinal tubulation. SRHM - subretinal hyper-reflective material      Intravitreal Injection, Pharmacologic Agent - OD - Right Eye       Time Out 09/13/2022. 3:17 PM. Confirmed correct patient, procedure, site, and patient consented.   Anesthesia Topical anesthesia was used. Anesthetic medications included Lidocaine 2%, Proparacaine 0.5%.   Procedure Preparation included 5% betadine to ocular surface, eyelid speculum. A (32g) needle was used.   Injection: 2 mg aflibercept 2 MG/0.05ML   Route: Intravitreal, Site: Right Eye   NDC: [id number], Lot: [id number], Expiration date: 12/09/2023, Waste: 0 mL   Post-op Post injection exam found visual acuity of at least counting fingers. The patient tolerated the procedure well. There were no complications. The patient received written and verbal post procedure care  education. Post injection medications were not given.            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 Exudative age-related macular degeneration of right eye with active choroidal neovascularization (HCC)  H35.3211 OCT, Retina - OU - Both Eyes    Intravitreal Injection, Pharmacologic Agent - OD - Right Eye    aflibercept (EYLEA) SOLN 2 mg    3. Bilateral retinoschisis  H33.103 OCT, Retina - OU - Both Eyes    4. Essential hypertension  I10     5. Hypertensive retinopathy of both eyes  H35.033     6. Combined forms of age-related cataract of both eyes  H25.813      1,2. CSCR OD -- chronic  - pt reports decreased vision OD since December 2019  - reports significant stressors in life -- work  - denies steroid use  - FA (09.30.20) shows focal hyperfluorescent leakage points nasal macula OD -- expansile dot phenotype?  - started 50 mg po eplerenone since 09.30.20  -- pt self d/c in February, re-started in June 2021  - s/p IVA #1 (09.01.21), #2 (09.29.21), #3 (11.03.21), #4 (12.03.21) -- IVA resistance - s/p IVE OD #1 (01.20.22), #2 (02.18.22), #3 (04.05.22-sample), #4 (05.23.22), # 5 (06.27.22), #6 (07.28.22), #7 (08.26.22), #8 (09.23.22), #9 (10.21.22), #10 (11.18.22), #11 (12.16.22), #12 (01.20.23), #13 (03.02.23), #14 (04.07.23), #15 (05.12.23), #16 (06.09.23), #17 (07.14.23), #18 (08.08.23), #19 (09.08.23), #20 (10.06.23), #21 (11.06.23)  - BCVA OD improved to 20/70 from 20/80  - OCT OD shows Interval improvement in foveal contour, Interval resolution of SRF centrally, persistent ORA, persistent bullous schisis caught IT periphery on widefield -- stable from prior  - differential includes atypical exudative ARMD  - recommend IVE OD #22 today, 12.04.23 with f/u in 5 weeks  - RBA of procedure discussed, questions answered  - IVA informed consent obtained and  signed 09.01.21  - IVE informed  consent obtained and signed, 01.20.22  - see procedure note  - Eylea[employer]U benefits investigation initiated 12.3.21 -- approved for 2023  - continue po eplerenone 50 mg daily  - f/u 5 weeks -- DFE/OCT, possible injection  3. Retinoschisis OU (OD > OS) -- stable  - bullous retinoschisis cavity IT quadrant OD-- 1610-9604 oclock  - shallow retinoschisis cavity IT quadrant OS  - no associated retinal holes/RT/RD on scleral depression    - widefield OCT shows stable bullous schisis OD -- stsable  - discussed findings and prognosis   - recommend observation--will consider laser if retinopexy progresses or develops into RD  4,5. Hypertensive retinopathy OU  - discussed importance of tight BP control  - monitor  6. 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  - discussed diagnosis and progression  - monitor  Ophthalmic Meds Ordered this visit:  Meds ordered this encounter  Medications   aflibercept (EYLEA) SOLN 2 mg     Return in about 5 weeks (around 10/18/2022) for f/u exu ARMD OD, DFE, OCT.  There are no Patient Instructions on file for this visit.  This document serves as a record of services personally performed by Gardiner Sleeper, MD, PhD. It was created on their behalf by San Jetty. Owens Shark, OA an ophthalmic technician. The creation of this record is the provider's dictation and/or activities during the visit.    Electronically signed by: San Jetty. Owens Shark, [place] 12.01.2023 5:36 PM   Gardiner Sleeper, M.D., Ph.D. Diseases & Surgery of the Retina and Vitreous [facility]  I have reviewed the above documentation for accuracy and completeness, and I agree with the above. Gardiner Sleeper, M.D., Ph.D. 09/13/22 5:36 PM  Abbreviations: M myopia (nearsighted); A astigmatism; H hyperopia (farsighted); P presbyopia; Mrx spectacle prescription;  CTL contact lenses; OD right eye; OS  left eye; OU both eyes  XT exotropia; ET esotropia; PEK punctate epithelial keratitis; PEE punctate epithelial erosions; DES dry eye syndrome; MGD meibomian gland dysfunction; ATs artificial tears; PFAT's preservative free artificial tears; [place] nuclear sclerotic cataract; PSC posterior subcapsular cataract; ERM epi-retinal membrane; PVD posterior vitreous detachment; RD retinal detachment; DM diabetes mellitus; DR diabetic retinopathy; NPDR non-proliferative diabetic retinopathy; PDR proliferative diabetic retinopathy; CSME clinically significant macular edema; DME diabetic macular edema; dbh dot blot hemorrhages; CWS cotton wool spot; POAG primary open angle glaucoma; C/D cup-to-disc ratio; HVF humphrey visual field; GVF goldmann visual field; OCT optical coherence tomography; IOP intraocular pressure; BRVO Branch retinal vein occlusion; CRVO central retinal vein occlusion; CRAO central retinal artery occlusion; BRAO branch retinal artery occlusion; RT retinal tear; SB scleral buckle; PPV pars plana vitrectomy; VH Vitreous hemorrhage; PRP panretinal laser photocoagulation; IVK intravitreal kenalog; VMT vitreomacular traction; MH Macular hole;  NVD neovascularization of the disc; NVE neovascularization elsewhere; AREDS age related eye disease study; ARMD age related macular degeneration; POAG primary open angle glaucoma; EBMD epithelial/anterior basement membrane dystrophy; ACIOL anterior chamber intraocular lens; IOL intraocular lens; PCIOL posterior chamber intraocular lens; Phaco/IOL phacoemulsification with intraocular lens placement; [place] photorefractive keratectomy; LASIK laser assisted in situ keratomileusis; HTN hypertension; DM diabetes mellitus; COPD chronic obstructive pulmonary disease

## 2022-09-13 ENCOUNTER — Ambulatory Visit (INDEPENDENT_AMBULATORY_CARE_PROVIDER_SITE_OTHER): Payer: Commercial Managed Care - PPO | Admitting: Ophthalmology

## 2022-09-13 ENCOUNTER — Encounter (INDEPENDENT_AMBULATORY_CARE_PROVIDER_SITE_OTHER): Payer: Self-pay | Admitting: Ophthalmology

## 2022-09-13 DIAGNOSIS — H353211 Exudative age-related macular degeneration, right eye, with active choroidal neovascularization: Secondary | ICD-10-CM

## 2022-09-13 DIAGNOSIS — I10 Essential (primary) hypertension: Secondary | ICD-10-CM

## 2022-09-13 DIAGNOSIS — H35033 Hypertensive retinopathy, bilateral: Secondary | ICD-10-CM

## 2022-09-13 DIAGNOSIS — H33103 Unspecified retinoschisis, bilateral: Secondary | ICD-10-CM

## 2022-09-13 DIAGNOSIS — H25813 Combined forms of age-related cataract, bilateral: Secondary | ICD-10-CM

## 2022-09-13 DIAGNOSIS — H35711 Central serous chorioretinopathy, right eye: Secondary | ICD-10-CM | POA: Diagnosis not present

## 2022-09-13 MED ORDER — AFLIBERCEPT 2MG/0.05ML IZ SOLN FOR KALEIDOSCOPE
2.0000 mg | INTRAVITREAL | Status: AC | PRN
  Administered 2022-09-13: 2 mg via INTRAVITREAL

## 2022-09-13 NOTE — Progress Notes (Signed)
[facility] Note  09/13/2022     CHIEF COMPLAINT Patient presents for Retina Follow Up   HISTORY OF PRESENT ILLNESS: [name] is a 51 y.o. female who presents to the clinic today for:  HPI     Retina Follow Up   Patient presents with  Other.  In right eye.  Severity is moderate.  Duration of 4 weeks.  Since onset it is stable.  I, the attending physician,  performed the HPI with the patient and updated documentation appropriately.        Comments   Pt here for 4 wk ret f/u CSC OD. Pt states VA is the same.       Last edited by [name], MD on 09/13/2022  5:35 PM.    Pt states vision is the same, she just took a week vacation from work, pt got a new glasses rx from Dr. Jerline Pain  Referring physician: No referring provider defined for this encounter.  HISTORICAL INFORMATION:   Selected notes from the MEDICAL RECORD NUMBER Referred by [name] for concern of sub-macular fluid / retinoschisis   CURRENT MEDICATIONS: No current outpatient medications on file. (Ophthalmic Drugs)   No current facility-administered medications for this visit. (Ophthalmic Drugs)   Current Outpatient Medications (Other)  Medication Sig   diphenhydrAMINE HCl (BENADRYL ALLERGY PO) Take by mouth.   fexofenadine (ALLEGRA) 180 MG tablet Take 180 mg by mouth daily.   hydrochlorothiazide (HYDRODIURIL) 25 MG tablet TAKE 1 TABLET BY MOUTH ONCE DAILY IN THE MORNING FOR 90 DAYS   lisinopril (PRINIVIL,ZESTRIL) 20 MG tablet Take 1 tablet (20 mg total) by mouth daily.   eplerenone (INSPRA) 50 MG tablet Take 1 tablet (50 mg total) by mouth daily.   No current facility-administered medications for this visit. (Other)   REVIEW OF SYSTEMS: ROS   Positive for: Cardiovascular, Eyes Negative for: Constitutional, Gastrointestinal, Neurological, Skin, Genitourinary, Musculoskeletal, HENT, Endocrine, Respiratory, Psychiatric, Allergic/Imm, Heme/Lymph Last  edited by Kingsley Spittle, COT on 09/13/2022  2:29 PM.     ALLERGIES Allergies  Allergen Reactions   Pollen Extract Itching   PAST MEDICAL HISTORY Past Medical History:  Diagnosis Date   Abnormal Pap smear of cervix    12-28-21 lgsil HPV HR +   Cataract    Mixed form OU   CIN I (cervical intraepithelial neoplasia I)    Depression    High risk HPV infection    Hypertension    Hypertensive retinopathy    OU   Missed ab AGUST 6 2014   Retina disorder 10/22/2019   pt states taking inspra for excess fluid in retina   Sciatic nerve injury    Past Surgical History:  Procedure Laterality Date   MYOMECTOMY  2008   FAMILY HISTORY Family History  Problem Relation Age of Onset   Hypertension Mother    Diabetes Father    Diabetes Maternal Grandmother    Hypertension Maternal Grandmother    Colon cancer Neg Hx    Colon polyps Neg Hx    Esophageal cancer Neg Hx    Rectal cancer Neg Hx    Stomach cancer Neg Hx    SOCIAL HISTORY Social History   Tobacco Use   Smoking status: Never   Smokeless tobacco: Never  Vaping Use   Vaping Use: Never used  Substance Use Topics   Alcohol use: No    Alcohol/week: 0.0 standard drinks of alcohol   Drug use: No  OPHTHALMIC EXAM: Base Eye Exam     Visual Acuity (Snellen - Linear)       Right Left   Dist cc 20/80 -2 20/25 -1   Dist ph cc 20/70 -1 20/20 -2    Correction: Glasses         Tonometry (Tonopen, 2:35 PM)       Right Left   Pressure 13 13         Pupils       Dark Light Shape React APD   Right 5 4 Roundq Brisk None   Left 5 4 Roundq Brisk None         Visual Fields (Counting fingers)       Left Right    Full Full         Extraocular Movement       Right Left    Full, Ortho Full, Ortho         Neuro/Psych     Oriented x3: Yes   Mood/Affect: Normal         Dilation     Both eyes: 1.0% Mydriacyl, 2.5% Phenylephrine @ 2:36 PM           Slit Lamp and Fundus Exam      Slit Lamp Exam       Right Left   Lids/Lashes Dermatochalasis - upper lid Dermatochalasis - upper lid   Conjunctiva/Sclera Injection, nasal pterygium with +vessels 1+ Injection, nasal Pinguecula   Cornea Nasal ptergyium extending 2.62m onto nasal cornea 1+Punctate epithelial erosions   Anterior Chamber Deep, clear, narrow temporal angle, No cell or flare deep, narrow temporal angle, 1+pigment   Iris Round and dilated Round and dilated   Lens 2+ Nuclear sclerosis, 2+ Cortical cataract 2+ Nuclear sclerosis, 2+ Cortical cataract   Anterior Vitreous Mild Vitreous syneresis, Posterior vitreous detachment, vitreous condensations Mild Vitreous syneresis         Fundus Exam       Right Left   Disc Pink and Sharp, +cupping Pink and Sharp, +cupping, shallow peripapillary SRF just nasal to disc - improved   C/D Ratio 0.65 0.6   Macula Flat, Blunted foveal reflex, central atrophy with interval improvement in SRF centrally, RPE mottling, clumping and atrophy, No heme Flat, good foveal reflex, Epiretinal membrane, mild RPE mottling and clumping superiorly, No heme or edema   Vessels mild attenuation, mild tortuosity Vascular attenuation, mild Tortuousity   Periphery Attached, bullous Retinoschisis cavity from 0700-0900 -- no associated RT/RD - stable from prior, shallow schisis cavity ST, mild peripheral cystoid degeneration Attached, temporal retinoschisis cavity from 0330-0430 peripherally -- stable from prior           Refraction     Wearing Rx       Sphere Cylinder Axis   Right +1.25 +1.25 112   Left +2.25 +1.00 101           IMAGING AND PROCEDURES  Imaging and Procedures for _0 @  OCT, Retina - OU - Both Eyes       Right Eye Quality was good. Central Foveal Thickness: 233. Progression has improved. Findings include normal foveal contour, no IRF, no SRF, intraretinal hyper-reflective material, outer retinal atrophy, vitreomacular adhesion (Interval improvement in foveal  contour, Interval resolution of SRF centrally, persistent ORA, persistent bullous schisis caught IT periphery on widefield -- stable from prior).   Left Eye Quality was good. Central Foveal Thickness: 302. Progression has been stable. Findings include normal foveal contour, no IRF, no SRF, vitreomacular  adhesion (Shallow pocket of SRF nasal disc caught on widefield - improved, +schisis IT quad caught on widefield).   Notes *Images captured and stored on drive  Diagnosis / Impression:  OD: Interval improvement in foveal contour, Interval resolution of SRF centrally, persistent ORA, persistent bullous schisis caught IT periphery on widefield -- stable from prior OS: NFP, no IRF/SRF centrally, +VMA; Shallow pocket of SRF nasal disc caught on widefield - improved, +schisis IT quad caught on widefield  Clinical management:  See below  Abbreviations: NFP - Normal foveal profile. CME - cystoid macular edema. PED - pigment epithelial detachment. IRF - intraretinal fluid. SRF - subretinal fluid. EZ - ellipsoid zone. ERM - epiretinal membrane. ORA - outer retinal atrophy. ORT - outer retinal tubulation. SRHM - subretinal hyper-reflective material      Intravitreal Injection, Pharmacologic Agent - OD - Right Eye       Time Out 09/13/2022. 3:17 PM. Confirmed correct patient, procedure, site, and patient consented.   Anesthesia Topical anesthesia was used. Anesthetic medications included Lidocaine 2%, Proparacaine 0.5%.   Procedure Preparation included 5% betadine to ocular surface, eyelid speculum. A (32g) needle was used.   Injection: 2 mg aflibercept 2 MG/0.05ML   Route: Intravitreal, Site: Right Eye   NDC: A3590391, Lot: [id number], Expiration date: 12/09/2023, Waste: 0 mL   Post-op Post injection exam found visual acuity of at least counting fingers. The patient tolerated the procedure well. There were no complications. The patient received written and verbal post procedure care  education. Post injection medications were not given.            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 Exudative age-related macular degeneration of right eye with active choroidal neovascularization (HCC)  H35.3211 OCT, Retina - OU - Both Eyes    Intravitreal Injection, Pharmacologic Agent - OD - Right Eye    aflibercept (EYLEA) SOLN 2 mg    3. Bilateral retinoschisis  H33.103 OCT, Retina - OU - Both Eyes    4. Essential hypertension  I10     5. Hypertensive retinopathy of both eyes  H35.033     6. Combined forms of age-related cataract of both eyes  H25.813      1,2. CSCR OD -- chronic  - pt reports decreased vision OD since December 2019  - reports significant stressors in life -- work  - denies steroid use  - FA (09.30.20) shows focal hyperfluorescent leakage points nasal macula OD -- expansile dot phenotype?  - started 50 mg po eplerenone since 09.30.20  -- pt self d/c in February, re-started in June 2021  - s/p IVA #1 (09.01.21), #2 (09.29.21), #3 (11.03.21), #4 (12.03.21) -- IVA resistance - s/p IVE OD #1 (01.20.22), #2 (02.18.22), #3 (04.05.22-sample), #4 (05.23.22), # 5 (06.27.22), #6 (07.28.22), #7 (08.26.22), #8 (09.23.22), #9 (10.21.22), #10 (11.18.22), #11 (12.16.22), #12 (01.20.23), #13 (03.02.23), #14 (04.07.23), #15 (05.12.23), #16 (06.09.23), #17 (07.14.23), #18 (08.08.23), #19 (09.08.23), #20 (10.06.23), #21 (11.06.23)  - BCVA OD improved to 20/70 from 20/80  - OCT OD shows Interval improvement in foveal contour, Interval resolution of SRF centrally, persistent ORA, persistent bullous schisis caught IT periphery on widefield -- stable from prior  - differential includes atypical exudative ARMD  - recommend IVE OD #22 today, 12.04.23 with f/u in 5 weeks  - RBA of procedure discussed, questions answered  - IVA informed consent obtained and  signed 09.01.21  - IVE informed  consent obtained and signed, 01.20.22  - see procedure note  - Eylea[employer]U benefits investigation initiated 12.3.21 -- approved for 2023  - continue po eplerenone 50 mg daily  - f/u 5 weeks -- DFE/OCT, possible injection  3. Retinoschisis OU (OD > OS) -- stable  - bullous retinoschisis cavity IT quadrant OD-- 9562-1308 oclock  - shallow retinoschisis cavity IT quadrant OS  - no associated retinal holes/RT/RD on scleral depression    - widefield OCT shows stable bullous schisis OD -- stsable  - discussed findings and prognosis   - recommend observation--will consider laser if retinopexy progresses or develops into RD  4,5. Hypertensive retinopathy OU  - discussed importance of tight BP control  - monitor  6. 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  - discussed diagnosis and progression  - monitor  Ophthalmic Meds Ordered this visit:  Meds ordered this encounter  Medications   aflibercept (EYLEA) SOLN 2 mg     Return in about 5 weeks (around 10/18/2022) for f/u exu ARMD OD, DFE, OCT.  There are no Patient Instructions on file for this visit.  This document serves as a record of services personally performed by Gardiner Sleeper, MD, PhD. It was created on their behalf by San Jetty. Owens Shark, OA an ophthalmic technician. The creation of this record is the provider's dictation and/or activities during the visit.    Electronically signed by: San Jetty. Owens Shark, [place] 12.01.2023 5:37 PM   Gardiner Sleeper, M.D., Ph.D. Diseases & Surgery of the Retina and Vitreous [facility]  I have reviewed the above documentation for accuracy and completeness, and I agree with the above. Gardiner Sleeper, M.D., Ph.D. 09/13/22 5:37 PM  Abbreviations: M myopia (nearsighted); A astigmatism; H hyperopia (farsighted); P presbyopia; Mrx spectacle prescription;  CTL contact lenses; OD right eye; OS  left eye; OU both eyes  XT exotropia; ET esotropia; PEK punctate epithelial keratitis; PEE punctate epithelial erosions; DES dry eye syndrome; MGD meibomian gland dysfunction; ATs artificial tears; PFAT's preservative free artificial tears; [place] nuclear sclerotic cataract; PSC posterior subcapsular cataract; ERM epi-retinal membrane; PVD posterior vitreous detachment; RD retinal detachment; DM diabetes mellitus; DR diabetic retinopathy; NPDR non-proliferative diabetic retinopathy; PDR proliferative diabetic retinopathy; CSME clinically significant macular edema; DME diabetic macular edema; dbh dot blot hemorrhages; CWS cotton wool spot; POAG primary open angle glaucoma; C/D cup-to-disc ratio; HVF humphrey visual field; GVF goldmann visual field; OCT optical coherence tomography; IOP intraocular pressure; BRVO Branch retinal vein occlusion; CRVO central retinal vein occlusion; CRAO central retinal artery occlusion; BRAO branch retinal artery occlusion; RT retinal tear; SB scleral buckle; PPV pars plana vitrectomy; VH Vitreous hemorrhage; PRP panretinal laser photocoagulation; IVK intravitreal kenalog; VMT vitreomacular traction; MH Macular hole;  NVD neovascularization of the disc; NVE neovascularization elsewhere; AREDS age related eye disease study; ARMD age related macular degeneration; POAG primary open angle glaucoma; EBMD epithelial/anterior basement membrane dystrophy; ACIOL anterior chamber intraocular lens; IOL intraocular lens; PCIOL posterior chamber intraocular lens; Phaco/IOL phacoemulsification with intraocular lens placement; [place] photorefractive keratectomy; LASIK laser assisted in situ keratomileusis; HTN hypertension; DM diabetes mellitus; COPD chronic obstructive pulmonary disease

## 2022-10-14 NOTE — Progress Notes (Signed)
[facility] Note  10/18/2022     CHIEF COMPLAINT Patient presents for Retina Follow Up   HISTORY OF PRESENT ILLNESS: [name] is a 52 y.o. female who presents to the clinic today for:  HPI     Retina Follow Up   Patient presents with  Wet AMD.  In right eye.  This started 5 weeks ago.  Duration of 5 weeks.  I, the attending physician,  performed the HPI with the patient and updated documentation appropriately.        Comments   5 week retina follow up ARMD IVE OD  pt is reporting no vision changes noticed she denies flashes or floaters       Last edited by [name], MD on 10/18/2022  3:48 PM.    Pt states vision is stable  Referring physician: No referring provider defined for this encounter.  HISTORICAL INFORMATION:   Selected notes from the MEDICAL RECORD NUMBER Referred by [name] for concern of sub-macular fluid / retinoschisis   CURRENT MEDICATIONS: No current outpatient medications on file. (Ophthalmic Drugs)   No current facility-administered medications for this visit. (Ophthalmic Drugs)   Current Outpatient Medications (Other)  Medication Sig   diphenhydrAMINE HCl (BENADRYL ALLERGY PO) Take by mouth.   eplerenone (INSPRA) 50 MG tablet Take 1 tablet (50 mg total) by mouth daily.   fexofenadine (ALLEGRA) 180 MG tablet Take 180 mg by mouth daily.   hydrochlorothiazide (HYDRODIURIL) 25 MG tablet TAKE 1 TABLET BY MOUTH ONCE DAILY IN THE MORNING FOR 90 DAYS   lisinopril (PRINIVIL,ZESTRIL) 20 MG tablet Take 1 tablet (20 mg total) by mouth daily.   No current facility-administered medications for this visit. (Other)   REVIEW OF SYSTEMS: ROS   Positive for: Eyes Last edited by [name], MD on 10/18/2022  3:48 PM.     ALLERGIES Allergies  Allergen Reactions   Pollen Extract Itching   PAST MEDICAL HISTORY Past Medical History:  Diagnosis Date   Abnormal Pap smear of cervix    12-28-21 lgsil  HPV HR +   Cataract    Mixed form OU   CIN I (cervical intraepithelial neoplasia I)    Depression    High risk HPV infection    Hypertension    Hypertensive retinopathy    OU   Missed ab AGUST 6 2014   Retina disorder 10/22/2019   pt states taking inspra for excess fluid in retina   Sciatic nerve injury    Past Surgical History:  Procedure Laterality Date   MYOMECTOMY  2008   FAMILY HISTORY Family History  Problem Relation Age of Onset   Hypertension Mother    Diabetes Father    Diabetes Maternal Grandmother    Hypertension Maternal Grandmother    Colon cancer Neg Hx    Colon polyps Neg Hx    Esophageal cancer Neg Hx    Rectal cancer Neg Hx    Stomach cancer Neg Hx    SOCIAL HISTORY Social History   Tobacco Use   Smoking status: Never   Smokeless tobacco: Never  Vaping Use   Vaping Use: Never used  Substance Use Topics   Alcohol use: No    Alcohol/week: 0.0 standard drinks of alcohol   Drug use: No       OPHTHALMIC EXAM: Base Eye Exam     Visual Acuity (Snellen - Linear)       Right Left   Dist cc  20/80 -2 20/25 -2   Dist ph cc NI NI         Tonometry (Tonopen, 2:28 PM)       Right Left   Pressure 15 16         Pupils       Pupils Dark Light Shape React APD   Right PERRL 5 4 Round Brisk None   Left PERRL 5 4 Round Brisk None         Visual Fields       Left Right    Full Full         Extraocular Movement       Right Left    Full, Ortho Full, Ortho         Neuro/Psych     Oriented x3: Yes   Mood/Affect: Normal           Slit Lamp and Fundus Exam     Slit Lamp Exam       Right Left   Lids/Lashes Dermatochalasis - upper lid Dermatochalasis - upper lid   Conjunctiva/Sclera Injection, nasal pterygium with +vessels 1+ Injection, nasal Pinguecula   Cornea Nasal ptergyium extending 2.103m onto nasal cornea 1+Punctate epithelial erosions   Anterior Chamber Deep, clear, narrow temporal angle, No cell or flare deep,  narrow temporal angle, 1+pigment   Iris Round and dilated Round and dilated   Lens 2+ Nuclear sclerosis, 2+ Cortical cataract 2+ Nuclear sclerosis, 2+ Cortical cataract   Anterior Vitreous Mild Vitreous syneresis, Posterior vitreous detachment, vitreous condensations Mild Vitreous syneresis         Fundus Exam       Right Left   Disc Pink and Sharp, +cupping Pink and Sharp, +cupping, shallow peripapillary SRF just nasal to disc - improved   C/D Ratio 0.65 0.6   Macula Flat, Blunted foveal reflex, central atrophy with mild interval increase in SRF centrally, RPE mottling, clumping and atrophy, No heme Flat, good foveal reflex, Epiretinal membrane, mild RPE mottling and clumping superiorly, No heme or edema   Vessels mild attenuation, mild tortuosity Vascular attenuation, mild Tortuousity   Periphery Attached, bullous Retinoschisis cavity from 0700-0900 -- no associated RT/RD - stable from prior, shallow schisis cavity ST, mild peripheral cystoid degeneration Attached, temporal retinoschisis cavity from 0330-0430 peripherally -- stable from prior           Refraction     Wearing Rx       Sphere Cylinder Axis   Right +1.25 +1.25 112   Left +2.25 +1.00 101           IMAGING AND PROCEDURES  Imaging and Procedures for '@TODAY'$ @  OCT, Retina - OU - Both Eyes       Right Eye Quality was good. Central Foveal Thickness: 239. Progression has worsened. Findings include normal foveal contour, no IRF, no SRF, intraretinal hyper-reflective material, outer retinal atrophy, vitreomacular adhesion (stable improvement in foveal contour, Interval increase in shalow sliver of SRF centrally, trace cystic changes, persistent ORA, persistent bullous schisis caught IT periphery on widefield -- stable from prior).   Left Eye Quality was good. Central Foveal Thickness: 301. Progression has been stable. Findings include normal foveal contour, no IRF, no SRF, vitreomacular adhesion (Shallow pocket of  SRF nasal disc caught on widefield - improved, +schisis IT quad caught on widefield).   Notes *Images captured and stored on drive  Diagnosis / Impression:  OD: stable improvement in foveal contour, Interval increase in shallow sliver of SRF centrally, trace  cystic changes, persistent ORA, persistent bullous schisis caught IT periphery on widefield -- stable from prior OS: NFP, no IRF/SRF centrally, +VMA; Shallow pocket of SRF nasal disc caught on widefield - improved, +schisis IT quad caught on widefield  Clinical management:  See below  Abbreviations: NFP - Normal foveal profile. CME - cystoid macular edema. PED - pigment epithelial detachment. IRF - intraretinal fluid. SRF - subretinal fluid. EZ - ellipsoid zone. ERM - epiretinal membrane. ORA - outer retinal atrophy. ORT - outer retinal tubulation. SRHM - subretinal hyper-reflective material      Intravitreal Injection, Pharmacologic Agent - OD - Right Eye       Time Out 10/18/2022. 3:09 PM. Confirmed correct patient, procedure, site, and patient consented.   Anesthesia Topical anesthesia was used. Anesthetic medications included Lidocaine 2%, Proparacaine 0.5%.   Procedure Preparation included 5% betadine to ocular surface, eyelid speculum. A (32g) needle was used.   Injection: 2 mg aflibercept 2 MG/0.05ML   Route: Intravitreal, Site: Right Eye   NDC: [id number], Lot: [id number], Expiration date: 01/08/2024, Waste: 0 mL   Post-op Post injection exam found visual acuity of at least counting fingers. The patient tolerated the procedure well. There were no complications. The patient received written and verbal post procedure care education. Post injection medications were not given.            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 Exudative age-related macular degeneration of right eye with active choroidal neovascularization (HCC)   H35.3211 OCT, Retina - OU - Both Eyes    Intravitreal Injection, Pharmacologic Agent - OD - Right Eye    aflibercept (EYLEA) SOLN 2 mg    3. 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 Essential hypertension  I10     5. Hypertensive retinopathy of both eyes  H35.033     6. Combined forms of age-related cataract of both eyes  H25.813      1,2. CSCR OD -- chronic  - pt reports decreased vision OD since December 2019  - reports significant stressors in life -- work  - denies steroid use  - FA (09.30.20) shows focal hyperfluorescent leakage points nasal macula OD -- expansile dot phenotype?  - started 50 mg po eplerenone since 09.30.20  -- pt self d/c in February, re-started in June 2021  - s/p IVA OD #1 (09.01.21), #2 (09.29.21), #3 (11.03.21), #4 (12.03.21) -- IVA resistance - s/p IVE OD #1 (01.20.22), #2 (02.18.22), #3 (04.05.22-sample), #4 (05.23.22), # 5 (06.27.22), #6 (07.28.22), #7 (08.26.22), #8 (09.23.22), #9 (10.21.22), #10 (11.18.22), #11 (12.16.22), #12 (01.20.23), #13 (03.02.23), #14 (04.07.23), #15 (05.12.23), #16 (06.09.23), #17 (07.14.23), #18 (08.08.23), #19 (09.08.23), #20 (10.06.23), #21 (11.06.23), #22 (12.04.23)  - BCVA OD 20/80  - OCT OD shows stable improvement in foveal contour, Interval increase in shalow sliver of SRF centrally, trace cystic changes, persistent ORA, persistent bullous schisis caught IT periphery on widefield -- stable from prior at 5 wks  - differential includes atypical exudative ARMD  - recommend IVE OD #23 today, 01.08.24 with f/u in 5 weeks  - RBA of procedure discussed, questions answered  - IVA informed consent obtained and signed 09.01.21  - IVE informed consent obtained and signed, 01.20.22  - see procedure note  - [employer]yle[employer] benefits investigation initiated 12.3.21 -- approved for 2023  - continue po eplerenone 50 mg daily  - f/u 5  weeks -- DFE/OCT, possible injection  3. Retinoschisis OU (OD > OS) -- stable  - bullous retinoschisis cavity IT  quadrant OD-- 0865-7846 oclock  - shallow retinoschisis cavity IT quadrant OS  - no associated retinal holes/RT/RD on scleral depression    - widefield OCT shows stable bullous schisis OD -- stsable  - discussed findings and prognosis   - recommend observation--will consider laser if retinopexy progresses or develops into RD  4,5. Hypertensive retinopathy OU  - discussed importance of tight BP control  - monitor  6. 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  - discussed diagnosis and progression  - monitor  Ophthalmic Meds Ordered this visit:  Meds ordered this encounter  Medications   eplerenone (INSPRA) 50 MG tablet    Sig: Take 1 tablet (50 mg total) by mouth daily.    Dispense:  30 tablet    Refill:  10   aflibercept (EYLEA) SOLN 2 mg     Return in about 5 weeks (around 11/22/2022) for f/u exu ARMD OD, DFE, OCT.  There are no Patient Instructions on file for this visit.  This document serves as a record of services personally performed by Gardiner Sleeper, MD, PhD. It was created on their behalf by Orvan Falconer, an ophthalmic technician. The creation of this record is the provider's dictation and/or activities during the visit.    Electronically signed by: Orvan Falconer, OA, 10/18/22  3:49 PM  This document serves as a record of services personally performed by Gardiner Sleeper, MD, PhD. It was created on their behalf by San Jetty. Owens Shark, OA an ophthalmic technician. The creation of this record is the provider's dictation and/or activities during the visit.    Electronically signed by: San Jetty. Marguerita Merles 01.08.2024 3:49 PM  Gardiner Sleeper, M.D., Ph.D. Diseases & Surgery of the Retina and Vitreous [facility]  I have reviewed the above documentation for accuracy and completeness, and I agree with the above. Gardiner Sleeper, M.D., Ph.D. 10/18/22 3:51  PM  Abbreviations: M myopia (nearsighted); A astigmatism; H hyperopia (farsighted); P presbyopia; Mrx spectacle prescription;  CTL contact lenses; OD right eye; OS left eye; OU both eyes  XT exotropia; ET esotropia; PEK punctate epithelial keratitis; PEE punctate epithelial erosions; DES dry eye syndrome; MGD meibomian gland dysfunction; ATs artificial tears; PFAT's preservative free artificial tears; McDonald nuclear sclerotic cataract; PSC posterior subcapsular cataract; ERM epi-retinal membrane; PVD posterior vitreous detachment; RD retinal detachment; DM diabetes mellitus; DR diabetic retinopathy; NPDR non-proliferative diabetic retinopathy; PDR proliferative diabetic retinopathy; CSME clinically significant macular edema; DME diabetic macular edema; dbh dot blot hemorrhages; CWS cotton wool spot; POAG primary open angle glaucoma; C/D cup-to-disc ratio; HVF humphrey visual field; GVF goldmann visual field; OCT optical coherence tomography; IOP intraocular pressure; BRVO Branch retinal vein occlusion; CRVO central retinal vein occlusion; CRAO central retinal artery occlusion; BRAO branch retinal artery occlusion; RT retinal tear; SB scleral buckle; PPV pars plana vitrectomy; VH Vitreous hemorrhage; PRP panretinal laser photocoagulation; IVK intravitreal kenalog; VMT vitreomacular traction; MH Macular hole;  NVD neovascularization of the disc; NVE neovascularization elsewhere; AREDS age related eye disease study; ARMD age related macular degeneration; POAG primary open angle glaucoma; EBMD epithelial/anterior basement membrane dystrophy; ACIOL anterior chamber intraocular lens; IOL intraocular lens; PCIOL posterior chamber intraocular lens; Phaco/IOL phacoemulsification with intraocular lens placement; PRK photorefractive keratectomy; LASIK laser assisted in situ keratomileusis; HTN  hypertension; DM diabetes mellitus; COPD chronic obstructive pulmonary disease

## 2022-10-18 ENCOUNTER — Ambulatory Visit (INDEPENDENT_AMBULATORY_CARE_PROVIDER_SITE_OTHER): Payer: Commercial Managed Care - PPO | Admitting: Ophthalmology

## 2022-10-18 ENCOUNTER — Encounter: Payer: Self-pay | Admitting: Gastroenterology

## 2022-10-18 ENCOUNTER — Encounter (INDEPENDENT_AMBULATORY_CARE_PROVIDER_SITE_OTHER): Payer: Self-pay | Admitting: Ophthalmology

## 2022-10-18 DIAGNOSIS — I10 Essential (primary) hypertension: Secondary | ICD-10-CM

## 2022-10-18 DIAGNOSIS — H353211 Exudative age-related macular degeneration, right eye, with active choroidal neovascularization: Secondary | ICD-10-CM

## 2022-10-18 DIAGNOSIS — H33103 Unspecified retinoschisis, bilateral: Secondary | ICD-10-CM | POA: Diagnosis not present

## 2022-10-18 DIAGNOSIS — H25813 Combined forms of age-related cataract, bilateral: Secondary | ICD-10-CM

## 2022-10-18 DIAGNOSIS — H35711 Central serous chorioretinopathy, right eye: Secondary | ICD-10-CM

## 2022-10-18 DIAGNOSIS — H35033 Hypertensive retinopathy, bilateral: Secondary | ICD-10-CM

## 2022-10-18 MED ORDER — AFLIBERCEPT 2MG/0.05ML IZ SOLN FOR KALEIDOSCOPE
2.0000 mg | INTRAVITREAL | Status: AC | PRN
  Administered 2022-10-18: 2 mg via INTRAVITREAL

## 2022-10-18 MED ORDER — EPLERENONE 50 MG PO TABS: 50.0000 mg | ORAL_TABLET | Freq: Every day | ORAL | 10 refills | Status: DC

## 2022-11-16 ENCOUNTER — Ambulatory Visit (AMBULATORY_SURGERY_CENTER): Payer: Commercial Managed Care - PPO | Admitting: *Deleted

## 2022-11-16 VITALS — Ht 64.0 in | Wt 162.0 lb

## 2022-11-16 DIAGNOSIS — Z86010 Personal history of colonic polyps: Secondary | ICD-10-CM

## 2022-11-16 MED ORDER — NA SULFATE-K SULFATE-MG SULF 17.5-3.13-1.6 GM/177ML PO SOLN: 1.0000 | Freq: Once | ORAL | 0 refills | Status: AC

## 2022-11-16 NOTE — Progress Notes (Signed)
[facility] Note  11/22/2022     CHIEF COMPLAINT Patient presents for Retina Follow Up   HISTORY OF PRESENT ILLNESS: [name] is a 52 y.o. female who presents to the clinic today for:  HPI     Retina Follow Up   Patient presents with  Wet AMD.  In right eye.  This started years ago.  Duration of 5 weeks.  I, the attending physician,  performed the HPI with the patient and updated documentation appropriately.        Comments   Patient feels that the vision is good,. She is not using any eye drops at this time.       Last edited by [name], MD on 11/22/2022  5:21 PM.      Referring physician: No referring provider defined for this encounter.  HISTORICAL INFORMATION:   Selected notes from the MEDICAL RECORD NUMBER Referred by [name] for concern of sub-macular fluid / retinoschisis   CURRENT MEDICATIONS: No current outpatient medications on file. (Ophthalmic Drugs)   No current facility-administered medications for this visit. (Ophthalmic Drugs)   Current Outpatient Medications (Other)  Medication Sig   diphenhydrAMINE HCl (BENADRYL ALLERGY PO) Take by mouth.   eplerenone (INSPRA) 50 MG tablet Take 1 tablet (50 mg total) by mouth daily.   fexofenadine (ALLEGRA) 180 MG tablet Take 180 mg by mouth daily.   hydrochlorothiazide (HYDRODIURIL) 25 MG tablet TAKE 1 TABLET BY MOUTH ONCE DAILY IN THE MORNING FOR 90 DAYS   lisinopril (PRINIVIL,ZESTRIL) 20 MG tablet Take 1 tablet (20 mg total) by mouth daily.   No current facility-administered medications for this visit. (Other)   REVIEW OF SYSTEMS: ROS   Positive for: Cardiovascular, Eyes Negative for: Constitutional, Gastrointestinal, Neurological, Skin, Genitourinary, Musculoskeletal, HENT, Endocrine, Respiratory, Psychiatric, Allergic/Imm, Heme/Lymph Last edited by Annie Paras, COT on 11/22/2022  2:11 PM.     ALLERGIES Allergies  Allergen  Reactions   Pollen Extract Itching   PAST MEDICAL HISTORY Past Medical History:  Diagnosis Date   Abnormal Pap smear of cervix    12-28-21 lgsil HPV HR +   Allergy    CIN I (cervical intraepithelial neoplasia I)    High risk HPV infection    Hypertension    Hypertensive retinopathy    OU   Missed ab AGUST 6 2014   Retina disorder 10/22/2019   pt states taking inspra for excess fluid in retina   Sciatic nerve injury    Past Surgical History:  Procedure Laterality Date   MYOMECTOMY  2008   FAMILY HISTORY Family History  Problem Relation Age of Onset   Hypertension Mother    Diabetes Father    Diabetes Maternal Grandmother    Hypertension Maternal Grandmother    Colon cancer Neg Hx    Colon polyps Neg Hx    Esophageal cancer Neg Hx    Rectal cancer Neg Hx    Stomach cancer Neg Hx    SOCIAL HISTORY Social History   Tobacco Use   Smoking status: Never   Smokeless tobacco: Never  Vaping Use   Vaping Use: Never used  Substance Use Topics   Alcohol use: No    Alcohol/week: 0.0 standard drinks of alcohol   Drug use: No       OPHTHALMIC EXAM: Base Eye Exam     Visual Acuity (Snellen - Linear)       Right Left   Dist cc 20/80 20/25  Dist ph cc NI     Correction: Glasses         Tonometry (Tonopen, 2:13 PM)       Right Left   Pressure 14 16         Pupils       Dark Light Shape React APD   Right 5 4 Round Brisk None   Left 5 4 Round Brisk None         Visual Fields       Left Right    Full Full         Extraocular Movement       Right Left    Full, Ortho Full, Ortho         Neuro/Psych     Oriented x3: Yes   Mood/Affect: Normal         Dilation     Both eyes: 2.5% Phenylephrine, 1.0% Mydriacyl @ 2:13 PM           Slit Lamp and Fundus Exam     Slit Lamp Exam       Right Left   Lids/Lashes Dermatochalasis - upper lid Dermatochalasis - upper lid   Conjunctiva/Sclera Injection, nasal pterygium with +vessels 1+  Injection, nasal Pinguecula   Cornea Nasal ptergyium extending 2.36m onto nasal cornea 1+Punctate epithelial erosions   Anterior Chamber Deep, clear, narrow temporal angle, No cell or flare deep, narrow temporal angle, 1+pigment   Iris Round and dilated Round and dilated   Lens 2+ Nuclear sclerosis, 2+ Cortical cataract 2+ Nuclear sclerosis, 2+ Cortical cataract   Anterior Vitreous Mild Vitreous syneresis, Posterior vitreous detachment, vitreous condensations Mild Vitreous syneresis         Fundus Exam       Right Left   Disc Pink and Sharp, +cupping Pink and Sharp, +cupping, shallow peripapillary SRF just nasal to disc - stably improved   C/D Ratio 0.65 0.6   Macula Flat, Blunted foveal reflex, central atrophy with mild interval improvement in SRF centrally, RPE mottling, clumping and atrophy, No heme Flat, good foveal reflex, Epiretinal membrane, mild RPE mottling and clumping superiorly, No heme or edema   Vessels attenuated, Tortuous Vascular attenuation, mild Tortuousity   Periphery Attached, bullous Retinoschisis cavity from 0700-0900 -- no associated RT/RD - stable from prior, shallow schisis cavity ST, mild peripheral cystoid degeneration Attached, temporal retinoschisis cavity from 0330-0430 peripherally -- stable from prior           Refraction     Wearing Rx       Sphere Cylinder Axis   Right +1.25 +1.25 112   Left +2.25 +1.00 101           IMAGING AND PROCEDURES  Imaging and Procedures for '@TODAY'$ @  OCT, Retina - OU - Both Eyes       Right Eye Quality was good. Central Foveal Thickness: 217. Progression has improved. Findings include normal foveal contour, no IRF, no SRF, intraretinal hyper-reflective material, outer retinal atrophy, vitreomacular adhesion (stable improvement in foveal contour, Interval improvement in shallow sliver of SRF centrally, trace cystic changes -- improved, persistent ORA, persistent bullous schisis caught IT periphery on widefield --  stable from prior).   Left Eye Quality was good. Central Foveal Thickness: 303. Progression has been stable. Findings include normal foveal contour, no IRF, no SRF, vitreomacular adhesion (Shallow pocket of SRF nasal disc caught on widefield - improved, +schisis IT quad caught on widefield).   Notes *Images captured and stored on drive  Diagnosis / Impression:  OD: stable improvement in foveal contour, Interval improvement in shallow sliver of SRF centrally, trace cystic changes -- improved, persistent ORA, persistent bullous schisis caught IT periphery on widefield -- stable from prior OS: NFP, no IRF/SRF centrally, +VMA; Shallow pocket of SRF nasal disc caught on widefield - improved, +schisis IT quad caught on widefield  Clinical management:  See below  Abbreviations: NFP - Normal foveal profile. CME - cystoid macular edema. PED - pigment epithelial detachment. IRF - intraretinal fluid. SRF - subretinal fluid. EZ - ellipsoid zone. ERM - epiretinal membrane. ORA - outer retinal atrophy. ORT - outer retinal tubulation. SRHM - subretinal hyper-reflective material      Intravitreal Injection, Pharmacologic Agent - OD - Right Eye       Time Out 11/22/2022. 3:05 PM. Confirmed correct patient, procedure, site, and patient consented.   Anesthesia Topical anesthesia was used. Anesthetic medications included Lidocaine 2%, Proparacaine 0.5%.   Procedure Preparation included 5% betadine to ocular surface, eyelid speculum. A (32g) needle was used.   Injection: 2 mg aflibercept 2 MG/0.05ML   Route: Intravitreal, Site: Right Eye   NDC: [id number], Lot: [id number], Expiration date: 12/09/2023, Waste: 0 mL   Post-op Post injection exam found visual acuity of at least counting fingers. The patient tolerated the procedure well. There were no complications. The patient received written and verbal post procedure care education. Post injection medications were not given.             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 Essential hypertension  I10     5. Hypertensive retinopathy of both eyes  H35.033     6. Combined forms of age-related cataract of both eyes  H25.813      1,2. CSCR OD -- chronic  - pt reports decreased vision OD since December 2019  - reports significant stressors in life -- work  - denies steroid use  - FA (09.30.20) shows focal hyperfluorescent leakage points nasal macula OD -- expansile dot phenotype?  - started 50 mg po eplerenone since 09.30.20  -- pt self d/c in February, re-started in June 2021  - s/p IVA OD #1 (09.01.21), #2 (09.29.21), #3 (11.03.21), #4 (12.03.21) -- IVA resistance - s/p IVE OD #1 (01.20.22), #2 (02.18.22), #3 (04.05.22-sample), #4 (05.23.22), # 5 (06.27.22), #6 (07.28.22), #7 (08.26.22), #8 (09.23.22), #9 (10.21.22), #10 (11.18.22), #11 (12.16.22), #12 (01.20.23), #13 (03.02.23), #14 (04.07.23), #15 (05.12.23), #16 (06.09.23), #17 (07.14.23), #18 (08.08.23), #19 (09.08.23), #20 (10.06.23), #21 (11.06.23), #22 (12.04.23), #23 (01.08.24)  - BCVA OD 20/80  - OCT OD shows stable improvement in foveal contour, Interval improvement in shallow sliver of SRF centrally, trace cystic changes -- improved, persistent ORA, persistent bullous schisis caught IT periphery on widefield -- stable from prior at 5 weeks  - differential includes atypical exudative ARMD  - recommend IVE OD #24 today, 02.12.24 with f/u in 5 weeks  - RBA of procedure discussed, questions answered  - IVA informed consent obtained and signed 09.01.21  - IVE informed consent obtained and signed,  01.20.22  -  see procedure note  - Eylea[employer]U benefits investigation initiated 12.3.21 -- approved for 2023  - continue po eplerenone 50 mg daily  - f/u 5 weeks -- DFE/OCT, possible injection  3. Retinoschisis OU (OD > OS) -- stable  - bullous retinoschisis cavity IT quadrant OD-- 2751-7001 oclock  - shallow retinoschisis cavity IT quadrant OS  - no associated retinal holes/RT/RD on scleral depression    - widefield OCT shows stable bullous schisis OD -- stsable  - discussed findings and prognosis   - recommend observation--will consider laser if retinopexy progresses or develops into RD  4,5. Hypertensive retinopathy OU  - discussed importance of tight BP control  - monitor  6. 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  - discussed diagnosis and progression  - monitor  Ophthalmic Meds Ordered this visit:  Meds ordered this encounter  Medications   aflibercept (EYLEA) SOLN 2 mg     Return in about 5 weeks (around 12/27/2022) for f/u exu ARMD OD, DFE, OCT.  There are no Patient Instructions on file for this visit.  This document serves as a record of services personally performed by Gardiner Sleeper, MD, PhD. It was created on their behalf by Orvan Falconer, an ophthalmic technician. The creation of this record is the provider's dictation and/or activities during the visit.    Electronically signed by: Orvan Falconer, OA, 11/23/22  1:21 AM  This document serves as a record of services personally performed by Gardiner Sleeper, MD, PhD. It was created on their behalf by San Jetty. Owens Shark, OA an ophthalmic technician. The creation of this record is the provider's dictation and/or activities during the visit.    Electronically signed by: San Jetty. Owens Shark, [place] 02.12.2024 1:21 AM  Gardiner Sleeper, M.D., Ph.D. Diseases & Surgery of the Retina and Vitreous [facility]  I  have reviewed the above documentation for accuracy and completeness, and I agree with the above. Gardiner Sleeper, M.D., Ph.D. 11/23/22 1:22 AM   Abbreviations: M myopia (nearsighted); A astigmatism; H hyperopia (farsighted); P presbyopia; Mrx spectacle prescription;  CTL contact lenses; OD right eye; OS left eye; OU both eyes  XT exotropia; ET esotropia; PEK punctate epithelial keratitis; PEE punctate epithelial erosions; DES dry eye syndrome; MGD meibomian gland dysfunction; ATs artificial tears; PFAT's preservative free artificial tears; [place] nuclear sclerotic cataract; PSC posterior subcapsular cataract; ERM epi-retinal membrane; PVD posterior vitreous detachment; RD retinal detachment; DM diabetes mellitus; DR diabetic retinopathy; NPDR non-proliferative diabetic retinopathy; PDR proliferative diabetic retinopathy; CSME clinically significant macular edema; DME diabetic macular edema; dbh dot blot hemorrhages; CWS cotton wool spot; POAG primary open angle glaucoma; C/D cup-to-disc ratio; HVF humphrey visual field; GVF goldmann visual field; OCT optical coherence tomography; IOP intraocular pressure; BRVO Branch retinal vein occlusion; CRVO central retinal vein occlusion; CRAO central retinal artery occlusion; BRAO branch retinal artery occlusion; RT retinal tear; SB scleral buckle; PPV pars plana vitrectomy; VH Vitreous hemorrhage; PRP panretinal laser photocoagulation; IVK intravitreal kenalog; VMT vitreomacular traction; MH Macular hole;  NVD neovascularization of the disc; NVE neovascularization elsewhere; AREDS age related eye disease study; ARMD age related macular degeneration; POAG primary open angle glaucoma; EBMD epithelial/anterior basement membrane dystrophy; ACIOL anterior chamber intraocular lens; IOL intraocular lens; PCIOL posterior chamber intraocular lens; Phaco/IOL phacoemulsification with intraocular lens placement; [place] photorefractive keratectomy; LASIK laser assisted in situ  keratomileusis; HTN hypertension; DM diabetes mellitus; COPD chronic  obstructive pulmonary disease

## 2022-11-16 NOTE — Progress Notes (Signed)
No egg or soy allergy known to patient  No issues known to pt with past sedation with any surgeries or procedures Patient denies ever being intubated  No issues moving head neck or swallowing No FH of Malignant Hyperthermia Pt is not on diet pills Pt is not on  home 02  Pt is not on blood thinners  Pt denies issues with constipation  Pt is not on dialysis Pt denies any upcoming cardiac testing Pt encouraged to use to use Singlecare or Goodrx to reduce cost  Patient's chart reviewed by Osvaldo Angst CNRA prior to previsit and patient appropriate for the [place].  Previsit completed and red dot placed by patient's name on their procedure day (on provider's schedule).  . Visit in person with interpreter  Interpreters name is Becky Sax Instructions read to pt by interpreter and pt states (per [place]) understanding. Instructed to review again prior to procedure. Pt states they will.  Instructions given to pt

## 2022-11-22 ENCOUNTER — Ambulatory Visit (INDEPENDENT_AMBULATORY_CARE_PROVIDER_SITE_OTHER): Payer: Commercial Managed Care - PPO | Admitting: Ophthalmology

## 2022-11-22 ENCOUNTER — Encounter (INDEPENDENT_AMBULATORY_CARE_PROVIDER_SITE_OTHER): Payer: Self-pay | Admitting: Ophthalmology

## 2022-11-22 DIAGNOSIS — H353211 Exudative age-related macular degeneration, right eye, with active choroidal neovascularization: Secondary | ICD-10-CM | POA: Diagnosis not present

## 2022-11-22 DIAGNOSIS — H33103 Unspecified retinoschisis, bilateral: Secondary | ICD-10-CM | POA: Diagnosis not present

## 2022-11-22 DIAGNOSIS — H35711 Central serous chorioretinopathy, right eye: Secondary | ICD-10-CM

## 2022-11-22 DIAGNOSIS — I10 Essential (primary) hypertension: Secondary | ICD-10-CM

## 2022-11-22 DIAGNOSIS — H35033 Hypertensive retinopathy, bilateral: Secondary | ICD-10-CM

## 2022-11-22 DIAGNOSIS — H25813 Combined forms of age-related cataract, bilateral: Secondary | ICD-10-CM

## 2022-11-22 MED ORDER — AFLIBERCEPT 2MG/0.05ML IZ SOLN FOR KALEIDOSCOPE
2.0000 mg | INTRAVITREAL | Status: AC | PRN
  Administered 2022-11-22: 2 mg via INTRAVITREAL

## 2022-12-01 ENCOUNTER — Encounter: Payer: Self-pay | Admitting: Gastroenterology

## 2022-12-14 ENCOUNTER — Encounter: Payer: Self-pay | Admitting: Gastroenterology

## 2022-12-14 ENCOUNTER — Ambulatory Visit (AMBULATORY_SURGERY_CENTER): Payer: Commercial Managed Care - PPO | Admitting: Gastroenterology

## 2022-12-14 VITALS — BP 121/77 | HR 66 | Temp 97.5°F | Resp 19 | Ht 64.0 in | Wt 162.0 lb

## 2022-12-14 DIAGNOSIS — D125 Benign neoplasm of sigmoid colon: Secondary | ICD-10-CM

## 2022-12-14 DIAGNOSIS — Z86010 Personal history of colonic polyps: Secondary | ICD-10-CM

## 2022-12-14 DIAGNOSIS — K5730 Diverticulosis of large intestine without perforation or abscess without bleeding: Secondary | ICD-10-CM

## 2022-12-14 DIAGNOSIS — K6389 Other specified diseases of intestine: Secondary | ICD-10-CM | POA: Diagnosis not present

## 2022-12-14 DIAGNOSIS — Z09 Encounter for follow-up examination after completed treatment for conditions other than malignant neoplasm: Secondary | ICD-10-CM | POA: Diagnosis present

## 2022-12-14 DIAGNOSIS — D120 Benign neoplasm of cecum: Secondary | ICD-10-CM | POA: Diagnosis not present

## 2022-12-14 DIAGNOSIS — K641 Second degree hemorrhoids: Secondary | ICD-10-CM

## 2022-12-14 DIAGNOSIS — D122 Benign neoplasm of ascending colon: Secondary | ICD-10-CM | POA: Diagnosis not present

## 2022-12-14 HISTORY — PX: COLONOSCOPY: SHX174

## 2022-12-14 MED ORDER — SODIUM CHLORIDE 0.9 % IV SOLN: 500.0000 mL | Freq: Once | INTRAVENOUS | Status: DC

## 2022-12-14 NOTE — Progress Notes (Signed)
Pt's states no medical or surgical changes since previsit or office visit. 

## 2022-12-14 NOTE — Progress Notes (Signed)
Uneventful anesthetic. Report to pacu rn. Vss. Care resumed by rn.

## 2022-12-14 NOTE — Patient Instructions (Signed)
Handout on hemorrhoids, diverticulosis, and polyps given to patient.  Await pathology results. Resume previous diet and continue present medications. Repeat colonoscopy for surveillance in 3-5 years once pathology results come back. Return to GI office as needed.   YOU HAD AN ENDOSCOPIC PROCEDURE TODAY AT [facility]:   Refer to the procedure report that was given to you for any specific questions about what was found during the examination.  If the procedure report does not answer your questions, please call your gastroenterologist to clarify.  If you requested that your care partner not be given the details of your procedure findings, then the procedure report has been included in a sealed envelope for you to review at your convenience later.  YOU SHOULD EXPECT: Some feelings of bloating in the abdomen. Passage of more gas than usual.  Walking can help get rid of the air that was put into your GI tract during the procedure and reduce the bloating. If you had a lower endoscopy (such as a colonoscopy or flexible sigmoidoscopy) you may notice spotting of blood in your stool or on the toilet paper. If you underwent a bowel prep for your procedure, you may not have a normal bowel movement for a few days.  Please Note:  You might notice some irritation and congestion in your nose or some drainage.  This is from the oxygen used during your procedure.  There is no need for concern and it should clear up in a day or so.  SYMPTOMS TO REPORT IMMEDIATELY:  Following lower endoscopy (colonoscopy or flexible sigmoidoscopy):  Excessive amounts of blood in the stool  Significant tenderness or worsening of abdominal pains  Swelling of the abdomen that is new, acute  Fever of 100F or higher   For urgent or emergent issues, a gastroenterologist can be reached at any hour by calling [phone]. Do not use MyChart messaging for urgent concerns.    DIET:  We do recommend a small meal at  first, but then you may proceed to your regular diet.  Drink plenty of fluids but you should avoid alcoholic beverages for 24 hours.  ACTIVITY:  You should plan to take it easy for the rest of today and you should NOT DRIVE or use heavy machinery until tomorrow (because of the sedation medicines used during the test).    FOLLOW UP: Our staff will call the number listed on your records the next business day following your procedure.  We will call around 7:15- 8:00 am to check on you and address any questions or concerns that you may have regarding the information given to you following your procedure. If we do not reach you, we will leave a message.     If any biopsies were taken you will be contacted by phone or by letter within the next 1-3 weeks.  Please call us at [phone] if you have not heard about the biopsies in 3 weeks.    SIGNATURES/CONFIDENTIALITY: You and/or your care partner have signed paperwork which will be entered into your electronic medical record.  These signatures attest to the fact that that the information above on your After Visit Summary has been reviewed and is understood.  Full responsibility of the confidentiality of this discharge information lies with you and/or your care-partner.  USTED TUVO UN PROCEDIMIENTO ENDOSCPICO HOY EN EL [facility]:   Lea el informe del procedimiento que se le entreg para cualquier pregunta especfica sobre lo que se Nurse, mental health su  examen.  Si el informe del examen no responde a sus preguntas, por favor llame a su gastroenterlogo para aclararlo.  Si usted solicit que no se le den [employer] de lo que se Estate manager/land agent en su procedimiento al [employer] va a cuidar, entonces el informe del procedimiento se ha incluido en un sobre sellado para que usted lo revise despus cuando le sea ms conveniente.   LO QUE PUEDE ESPERAR: Algunas sensaciones de hinchazn en el abdomen.  Puede tener ms gases de lo normal.  El caminar  puede ayudarle a eliminar el aire que se le puso en el tracto gastrointestinal durante el procedimiento y reducir la hinchazn.  Si le hicieron una endoscopia inferior (como una colonoscopia o una sigmoidoscopia flexible), podra notar manchas de sangre en las heces fecales o en el papel higinico.  Si se someti a una preparacin intestinal para su procedimiento, es posible que no tenga una evacuacin intestinal normal durante [employer].   Tenga en cuenta:  Es posible que note un poco de irritacin y congestin en la nariz o algn drenaje.  Esto es debido al oxgeno [employer] durante su procedimiento.  No hay que preocuparse y esto debe desaparecer ms o Scientist, research (medical).   SNTOMAS PARA REPORTAR INMEDIATAMENTE:  Despus de una endoscopia inferior (colonoscopia o sigmoidoscopia flexible):  Cantidades excesivas de sangre en las heces fecales  Sensibilidad significativa o empeoramiento de los dolores abdominales   Hinchazn aguda del abdomen que antes no tena   Fiebre de 100F o ms    Para asuntos urgentes o de Freight forwarder, puede comunicarse con un gastroenterlogo a cualquier hora llamando al [phone].  DIETA:  Recomendamos una comida pequea al principio, pero luego puede continuar con su dieta normal.  Tome muchos lquidos, Teacher, adult education las bebidas alcohlicas durante 24 horas.    ACTIVIDAD:  Debe planear tomarse las cosas con calma por el resto del da y no debe CONDUCIR ni usar maquinaria pesada Programmer, applications (debido a los medicamentos de sedacin utilizados durante el examen).     SEGUIMIENTO: Nuestro personal llamar al nmero que aparece en su historial al siguiente da hbil de su procedimiento para ver cmo se siente y para responder cualquier pregunta o inquietud que pueda tener con respecto a la informacin que se le dio despus del procedimiento. Si no podemos contactarle, le dejaremos un mensaje.  Sin embargo, si se siente bien y no tiene Paediatric nurse, no es necesario  que nos devuelva la llamada.  Asumiremos que ha regresado a sus actividades diarias normales sin incidentes. Si se le tomaron algunas biopsias, le contactaremos por telfono o por carta en las prximas 3 semanas.  Si no ha sabido [employer] biopsias en el transcurso de 3 semanas, por favor llmenos al [phone].   FIRMAS/CONFIDENCIALIDAD: Usted y/o el acompaante que le cuide han firmado documentos que se ingresarn en su historial mdico electrnico.  Estas firmas atestiguan el hecho de que la informacin anterior

## 2022-12-14 NOTE — Op Note (Signed)
[facility] Patient Name: [name] Procedure Date: 12/14/2022 8:22 AM MRN: [id number] Endoscopist: Gerrit Heck , MD, [id number] Age: 52 Referring MD:  Date of Birth: 24-Feb-1971 Gender: Female Account #: [id number] Procedure:                Colonoscopy Indications:              Surveillance: Personal history of adenomatous                            polyps on last colonoscopy 3 years ago                           Last colonoscopy was 10/2019 and notable for a 12 mm                            semipedunculated adenoma resected from transverse                            colon, 2 mm sigmoid adenoma, single diverticulum in                            the ascending colon, and small internal                            hemorrhoids. Recommended repeat in 3 years. Medicines:                Monitored Anesthesia Care Procedure:                Pre-Anesthesia Assessment:                           - Prior to the procedure, a History and Physical                            was performed, and patient medications and                            allergies were reviewed. The patient's tolerance of                            previous anesthesia was also reviewed. The risks                            and benefits of the procedure and the sedation                            options and risks were discussed with the patient.                            All questions were answered, and informed consent                            was obtained. Prior Anticoagulants: The patient has  taken no anticoagulant or antiplatelet agents. ASA                            Grade Assessment: II - A patient with mild systemic                            disease. After reviewing the risks and benefits,                            the patient was deemed in satisfactory condition to                            undergo the procedure.                           After obtaining informed  consent, the colonoscope                            was passed under direct vision. Throughout the                            procedure, the patient's blood pressure, pulse, and                            oxygen saturations were monitored continuously. The                            Olympus CF-HQ190L ([id number]) Colonoscope was                            introduced through the anus and advanced to the the                            cecum, identified by appendiceal orifice and                            ileocecal valve. The colonoscopy was performed                            without difficulty. The patient tolerated the                            procedure well. The quality of the bowel                            preparation was good. The ileocecal valve,                            appendiceal orifice, and rectum were photographed. Scope In: 8:30:13 AM Scope Out: 8:49:28 AM Scope Withdrawal Time: 0 hours 13 minutes 19 seconds  Total Procedure Duration: 0 hours 19 minutes 15 seconds  Findings:                 Hemorrhoids were found on perianal exam.  A 5 mm polyp was found in the sigmoid colon. The                            polyp was sessile. The polyp was removed with a                            cold snare. Resection and retrieval were complete.                            Estimated blood loss was minimal.                           Two sessile polyps were found in the ascending                            colon and cecum. The polyps were 2 to 4 mm in size.                            These polyps were removed with a cold snare.                            Resection and retrieval were complete. Estimated                            blood loss was minimal.                           A few small-mouthed diverticula were found in the                            ascending colon.                           Non-bleeding internal hemorrhoids were found during                             retroflexion. The hemorrhoids were small and Grade                            II (internal hemorrhoids that prolapse but reduce                            spontaneously). Complications:            No immediate complications. Estimated Blood Loss:     Estimated blood loss was minimal. Impression:               - Hemorrhoids found on perianal exam.                           - One 5 mm polyp in the sigmoid colon, removed with                            a cold snare. Resected and retrieved.                           -  Two 2 to 4 mm polyps in the ascending colon and                            in the cecum, removed with a cold snare. Resected                            and retrieved.                           - Diverticulosis in the ascending colon.                           - Non-bleeding internal hemorrhoids. Recommendation:           - Patient has a contact number available for                            emergencies. The signs and symptoms of potential                            delayed complications were discussed with the                            patient. Return to normal activities tomorrow.                            Written discharge instructions were provided to the                            patient.                           - Resume previous diet.                           - Continue present medications.                           - Await pathology results.                           - Repeat colonoscopy in 3 - 5 years for                            surveillance based on pathology results.                           - Return to GI clinic PRN.                           - Internal hemorrhoids were noted on this study and                            may be amenable to hemorrhoid band ligation. If you  are interested in further treatment of these                            hemorrhoids with band ligation, please contact my                             clinic to set up an appointment for evaluation and                            treatment. Gerrit Heck, MD 12/14/2022 8:54:24 AM

## 2022-12-14 NOTE — Progress Notes (Signed)
Called to room to assist during endoscopic procedure.  Patient ID and intended procedure confirmed with present staff. Received instructions for my participation in the procedure from the performing physician.  

## 2022-12-14 NOTE — Progress Notes (Signed)
GASTROENTEROLOGY PROCEDURE H&P NOTE   Primary Care Physician: [name], PA-C    Reason for Procedure:  Colon Cancer screening  Plan:    Colonoscopy  Patient is appropriate for endoscopic procedure(s) in the ambulatory ([place]) setting.  The nature of the procedure, as well as the risks, benefits, and alternatives were carefully and thoroughly reviewed with the patient. Ample time for discussion and questions allowed. The patient understood, was satisfied, and agreed to proceed.     HPI: [name] is a 52 y.o. female who presents for colonoscopy for ongoing polyp surveillance.  No active GI symptoms and no family history of colon cancer.  Last colonoscopy was 10/2019 and notable for a 12 mm semipedunculated adenoma resected from transverse colon, 2 mm sigmoid adenoma, single diverticulum in the ascending colon, and small internal hemorrhoids.  Recommended repeat in 3 years.  Past Medical History:  Diagnosis Date   Abnormal Pap smear of cervix    12-28-21 lgsil HPV HR +   Allergy    CIN I (cervical intraepithelial neoplasia I)    High risk HPV infection    Hypertension    Hypertensive retinopathy    OU   Missed ab AGUST 6 2014   Retina disorder 10/22/2019   pt states taking inspra for excess fluid in retina   Sciatic nerve injury     Past Surgical History:  Procedure Laterality Date   MYOMECTOMY  2008    Prior to Admission medications   Medication Sig Start Date End Date Taking? Authorizing Provider  hydrochlorothiazide (HYDRODIURIL) 25 MG tablet TAKE 1 TABLET BY MOUTH ONCE DAILY IN THE MORNING FOR 90 DAYS 05/24/19  Yes [provider]  lisinopril (PRINIVIL,ZESTRIL) 20 MG tablet Take 1 tablet (20 mg total) by mouth daily. 08/10/17  Yes Princess Bruins, MD  diphenhydrAMINE HCl (BENADRYL ALLERGY PO) Take by mouth.    [provider]  eplerenone (INSPRA) 50 MG tablet Take 1 tablet (50 mg total) by mouth daily. 10/18/22 11/17/22   [name], MD  fexofenadine (ALLEGRA) 180 MG tablet Take 180 mg by mouth daily.    [provider]    Current Outpatient Medications  Medication Sig Dispense Refill   hydrochlorothiazide (HYDRODIURIL) 25 MG tablet TAKE 1 TABLET BY MOUTH ONCE DAILY IN THE MORNING FOR 90 DAYS     lisinopril (PRINIVIL,ZESTRIL) 20 MG tablet Take 1 tablet (20 mg total) by mouth daily. 30 tablet 12   diphenhydrAMINE HCl (BENADRYL ALLERGY PO) Take by mouth.     eplerenone (INSPRA) 50 MG tablet Take 1 tablet (50 mg total) by mouth daily. 30 tablet 10   fexofenadine (ALLEGRA) 180 MG tablet Take 180 mg by mouth daily.     Current Facility-Administered Medications  Medication Dose Route Frequency Provider Last Rate Last Admin   0.9 %  sodium chloride infusion  500 mL Intravenous Once Koden Hunzeker V, DO        Allergies as of 12/14/2022 - Review Complete 12/14/2022  Allergen Reaction Noted   Pollen extract Itching 12/28/2021    Family History  Problem Relation Age of Onset   Hypertension Mother    Diabetes Father    Diabetes Maternal Grandmother    Hypertension Maternal Grandmother    Colon cancer Neg Hx    Colon polyps Neg Hx    Esophageal cancer Neg Hx    Rectal cancer Neg Hx    Stomach cancer Neg Hx     Social History   Socioeconomic History  Marital status: Single    Spouse name: Not on file   Number of children: Not on file   Years of education: Not on file   Highest education level: Not on file  Occupational History   Not on file  Tobacco Use   Smoking status: Never   Smokeless tobacco: Never  Vaping Use   Vaping Use: Never used  Substance and Sexual Activity   Alcohol use: No    Alcohol/week: 0.0 standard drinks of alcohol   Drug use: No   Sexual activity: Yes    Partners: Male    Birth control/protection: Condom  Other Topics Concern   Not on file  Social History Narrative   Not on file   Social Determinants of Health   Financial Resource Strain: Not on  file  Food Insecurity: Not on file  Transportation Needs: Not on file  Physical Activity: Not on file  Stress: Not on file  Social Connections: Not on file  Intimate Partner Violence: Not on file    Physical Exam: Vital signs in last 24 hours: '@BP'$  122/62   Pulse 80   Temp (!) 97.5 F (36.4 C)   Ht '5\' 4"'$  (1.626 m)   Wt 162 lb (73.5 kg)   SpO2 97%   BMI 27.81 kg/m  GEN: NAD EYE: Sclerae anicteric ENT: MMM CV: Non-tachycardic Pulm: CTA b/l GI: Soft, NT/ND NEURO:  Alert & Oriented x [place], DO [facility]   12/14/2022 8:19 AM

## 2022-12-14 NOTE — Progress Notes (Signed)
Interpreter used today at the [facility] for this pt.  Interpreter's name is-Timothy Layne Benton

## 2022-12-15 ENCOUNTER — Telehealth: Payer: Self-pay

## 2022-12-15 NOTE — Progress Notes (Signed)
[name] Note  12/27/2022     CHIEF COMPLAINT Patient presents for Retina Follow Up   HISTORY OF PRESENT ILLNESS: [name] is a 52 y.o. female who presents to the [facility] today for:  HPI     Retina Follow Up   Patient presents with  Wet AMD.  In right eye.  This started 5 weeks ago.  I, the attending physician,  performed the HPI with the patient and updated documentation appropriately.        Comments   Patient here for 5 weeks retina follow up for exu ARMD OD. Patient states vision doing good. No eye pain.       Last edited by [name], MD on 12/27/2022  1:31 PM.    Pt states "everything is fine"   Referring physician: [name], PA-C [address] [place],  [place] [address]  HISTORICAL INFORMATION:   Selected notes from the MEDICAL RECORD NUMBER Referred by [name] for concern of sub-macular fluid / retinoschisis   CURRENT MEDICATIONS: No current outpatient medications on file. (Ophthalmic Drugs)   No current facility-administered medications for this visit. (Ophthalmic Drugs)   Current Outpatient Medications (Other)  Medication Sig   diphenhydrAMINE HCl (BENADRYL ALLERGY PO) Take by mouth.   fexofenadine (ALLEGRA) 180 MG tablet Take 180 mg by mouth daily.   hydrochlorothiazide (HYDRODIURIL) 25 MG tablet TAKE 1 TABLET BY MOUTH ONCE DAILY IN THE MORNING FOR 90 DAYS   lisinopril (PRINIVIL,ZESTRIL) 20 MG tablet Take 1 tablet (20 mg total) by mouth daily.   eplerenone (INSPRA) 50 MG tablet Take 1 tablet (50 mg total) by mouth daily.   No current facility-administered medications for this visit. (Other)   REVIEW OF SYSTEMS: ROS   Positive for: Cardiovascular, Eyes Negative for: Constitutional, Gastrointestinal, Neurological, Skin, Genitourinary, Musculoskeletal, HENT, Endocrine, Respiratory, Psychiatric, Allergic/Imm, Heme/Lymph Last edited by [name], COA on 12/27/2022  12:56 PM.      ALLERGIES Allergies  Allergen Reactions   Pollen Extract Itching   PAST MEDICAL HISTORY Past Medical History:  Diagnosis Date   Abnormal Pap smear of cervix    12-28-21 lgsil HPV HR +   Allergy    CIN I (cervical intraepithelial neoplasia I)    High risk HPV infection    Hypertension    Hypertensive retinopathy    OU   Missed ab AGUST 6 2014   Retina disorder 10/22/2019   pt states taking inspra for excess fluid in retina   Sciatic nerve injury    Past Surgical History:  Procedure Laterality Date   MYOMECTOMY  2008   FAMILY HISTORY Family History  Problem Relation Age of Onset   Hypertension Mother    Diabetes Father    Diabetes Maternal Grandmother    Hypertension Maternal Grandmother    Colon cancer Neg Hx    Colon polyps Neg Hx    Esophageal cancer Neg Hx    Rectal cancer Neg Hx    Stomach cancer Neg Hx    SOCIAL HISTORY Social History   Tobacco Use   Smoking status: Never   Smokeless tobacco: Never  Vaping Use   Vaping Use: Never used  Substance Use Topics   Alcohol use: No    Alcohol/week: 0.0 standard drinks of alcohol   Drug use: No       OPHTHALMIC EXAM: Base Eye Exam     Visual Acuity (Snellen - Linear)  Right Left   Dist cc 20/80 -2 20/20 -1   Dist ph cc NI     Correction: Glasses         Tonometry (Tonopen, 12:54 PM)       Right Left   Pressure 19 16         Pupils       Dark Light Shape React APD   Right 3 2 Round Brisk None   Left 3 2 Round Brisk None         Visual Fields (Counting fingers)       Left Right    Full Full         Extraocular Movement       Right Left    Full, Ortho Full, Ortho         Neuro/Psych     Oriented x3: Yes   Mood/Affect: Normal         Dilation     Both eyes: 1.0% Mydriacyl, 2.5% Phenylephrine @ 12:54 PM           Slit Lamp and Fundus Exam     Slit Lamp Exam       Right Left   Lids/Lashes Dermatochalasis - upper lid Dermatochalasis -  upper lid   Conjunctiva/Sclera Injection, nasal pterygium with +vessels 1+ Injection, nasal Pinguecula   Cornea Nasal ptergyium extending 2.61mm onto nasal cornea 1+Punctate epithelial erosions   Anterior Chamber Deep, clear, narrow temporal angle, No cell or flare deep, narrow temporal angle, 1+pigment   Iris Round and dilated Round and dilated   Lens 2+ Nuclear sclerosis, 2+ Cortical cataract 2+ Nuclear sclerosis, 2+ Cortical cataract   Anterior Vitreous Mild Vitreous syneresis, Posterior vitreous detachment, vitreous condensations Mild Vitreous syneresis         Fundus Exam       Right Left   Disc Pink and Sharp, +cupping Pink and Sharp, +cupping, shallow peripapillary SRF just nasal to disc   C/D Ratio 0.65 0.6   Macula Flat, Blunted foveal reflex, central atrophy with mild interval increase in SRF centrally, RPE mottling, clumping and atrophy, No heme Flat, good foveal reflex, Epiretinal membrane, mild RPE mottling and clumping superiorly, No heme or edema   Vessels attenuated, Tortuous Vascular attenuation, mild Tortuousity   Periphery Attached, bullous Retinoschisis cavity from 0700-0900 -- no associated RT/RD - stable from prior, shallow schisis cavity ST, mild peripheral cystoid degeneration Attached, temporal retinoschisis cavity from 0330-0430 peripherally -- stable from prior           Refraction     Wearing Rx       Sphere Cylinder Axis   Right +1.25 +1.25 112   Left +2.25 +1.00 101           IMAGING AND PROCEDURES  Imaging and Procedures for @TODAY @  OCT, Retina - OU - Both Eyes       Right Eye Quality was good. Central Foveal Thickness: 312. Progression has worsened. Findings include normal foveal contour, no IRF, intraretinal hyper-reflective material, subretinal fluid, outer retinal atrophy, vitreomacular adhesion (stable improvement in foveal contour, mild interval increase in central SRF and IRF, persistent ORA, persistent bullous schisis caught IT  periphery on widefield -- stable from prior).   Left Eye Quality was good. Central Foveal Thickness: 300. Progression has worsened. Findings include normal foveal contour, no IRF, subretinal fluid, vitreomacular adhesion (Small pocket of SRF nasal disc caught on widefield, +schisis IT quad caught on widefield).   Notes *Images captured and stored  on drive  Diagnosis / Impression:  OD: stable improvement in foveal contour, mild interval increase in central SRF and IRF, persistent ORA, persistent bullous schisis caught IT periphery on widefield -- stable from prior OS: NFP, no IRF/SRF centrally, +VMA; Small pocket of SRF nasal disc caught on widefield, +schisis IT quad caught on widefield  Clinical management:  See below  Abbreviations: NFP - Normal foveal profile. CME - cystoid macular edema. PED - pigment epithelial detachment. IRF - intraretinal fluid. SRF - subretinal fluid. EZ - ellipsoid zone. ERM - epiretinal membrane. ORA - outer retinal atrophy. ORT - outer retinal tubulation. SRHM - subretinal hyper-reflective material      Intravitreal Injection, Pharmacologic Agent - OD - Right Eye       Time Out 12/27/2022. 1:19 PM. Confirmed correct patient, procedure, site, and patient consented.   Anesthesia Topical anesthesia was used. Anesthetic medications included Lidocaine 2%, Proparacaine 0.5%.   Procedure Preparation included 5% betadine to ocular surface, eyelid speculum. A (32g) needle was used.   Injection: 2 mg aflibercept 2 MG/0.05ML   Route: Intravitreal, Site: Right Eye   NDC: [id number], Lot: [id number], Expiration date: 08/11/2023, Waste: 0 mL   Post-op Post injection exam found visual acuity of at least counting fingers. The patient tolerated the procedure well. There were no complications. The patient received written and verbal post procedure care education. Post injection medications were not given.             ASSESSMENT/PLAN:    ICD-10-CM   1.  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 Essential hypertension  I10     5. Hypertensive retinopathy of both eyes  H35.033     6. Combined forms of age-related cataract of both eyes  H25.813       1,2. CSCR OD -- chronic  - pt reports decreased vision OD since December 2019  - reports significant stressors in life -- work  - denies steroid use  - FA (09.30.20) shows focal hyperfluorescent leakage points nasal macula OD -- expansile dot phenotype?  - started 50 mg po eplerenone since 09.30.20  -- pt self d/c in February, re-started in June 2021  - s/p IVA OD #1 (09.01.21), #2 (09.29.21), #3 (11.03.21), #4 (12.03.21) -- IVA resistance - s/p IVE OD #1 (01.20.22), #2 (02.18.22), #3 (04.05.22-sample), #4 (05.23.22), # 5 (06.27.22), #6 (07.28.22), #7 (08.26.22), #8 (09.23.22), #9 (10.21.22), #10 (11.18.22), #11 (12.16.22), #12 (01.20.23), #13 (03.02.23), #14 (04.07.23), #15 (05.12.23), #16 (06.09.23), #17 (07.14.23), #18 (08.08.23), #19 (09.08.23), #20 (10.06.23), #21 (11.06.23), #22 (12.04.23), #23 (01.08.24), #24 (02.12.24)  - BCVA OD 20/80  - OCT OD shows stable improvement in foveal contour, mild interval increase in central SRF and IRF, persistent ORA, persistent bullous schisis caught IT periphery on widefield -- stable from prior  - differential includes atypical exudative ARMD  - recommend IVE OD #25 today, 03.18.24 with f/u in 5 weeks  - RBA of procedure discussed, questions answered  - IVA informed consent obtained and signed 09.01.21  - IVE informed consent obtained and re-signed, 03.18.24  - see procedure note   - Eylea4U benefits investigation initiated 12.3.21 -- approved for  2023  - continue  po eplerenone 50 mg daily  - f/u 5 weeks -- DFE/OCT, possible injection  3. Retinoschisis OU (OD > OS) -- stable  - bullous retinoschisis cavity IT quadrant OD-- [id number] oclock  - shallow retinoschisis cavity IT quadrant OS  - no associated retinal holes/RT/RD on scleral depression    - widefield OCT shows stable bullous schisis OD -- stsable  - discussed findings and prognosis   - recommend observation--will consider laser if retinopexy progresses or develops into RD  4,5. Hypertensive retinopathy OU  - discussed importance of tight BP control  - monitor  6. 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  - discussed diagnosis and progression  - monitor  Ophthalmic Meds Ordered this visit:  Meds ordered this encounter  Medications   aflibercept (EYLEA) SOLN 2 mg     Return in about 5 weeks (around 01/31/2023) for f/u exu ARMD OD, DFE, OCT.  There are no Patient Instructions on file for this visit.  This document serves as a record of services personally performed by Gardiner Sleeper, MD, PhD. It was created on their behalf by Orvan Falconer, an ophthalmic technician. The creation of this record is the provider's dictation and/or activities during the visit.    Electronically signed by: Orvan Falconer, OA, 12/29/22  10:00 PM  This document serves as a record of services personally performed by Gardiner Sleeper, MD, PhD. It was created on their behalf by San Jetty. Owens Shark, OA an ophthalmic technician. The creation of this record is the provider's dictation and/or activities during the visit.    Electronically signed by: San Jetty. Owens Shark, [place] 03.18.2024 10:00 PM  Gardiner Sleeper, M.D., Ph.D. Diseases & Surgery of the Retina and Vitreous [facility]  I have reviewed the above documentation for accuracy and completeness, and I agree with the above.  Gardiner Sleeper, M.D., Ph.D. 12/29/22 10:00 PM   Abbreviations: M myopia (nearsighted); A astigmatism; H hyperopia (farsighted); P presbyopia; Mrx spectacle prescription;  CTL contact lenses; OD right eye; OS left eye; OU both eyes  XT exotropia; ET esotropia; PEK punctate epithelial keratitis; PEE punctate epithelial erosions; DES dry eye syndrome; MGD meibomian gland dysfunction; ATs artificial tears; PFAT's preservative free artificial tears; [place] nuclear sclerotic cataract; PSC posterior subcapsular cataract; ERM epi-retinal membrane; PVD posterior vitreous detachment; RD retinal detachment; DM diabetes mellitus; DR diabetic retinopathy; NPDR non-proliferative diabetic retinopathy; PDR proliferative diabetic retinopathy; CSME clinically significant macular edema; DME diabetic macular edema; dbh dot blot hemorrhages; CWS cotton wool spot; POAG primary open angle glaucoma; C/D cup-to-disc ratio; HVF humphrey visual field; GVF goldmann visual field; OCT optical coherence tomography; IOP intraocular pressure; BRVO Branch retinal vein occlusion; CRVO central retinal vein occlusion; CRAO central retinal artery occlusion; BRAO branch retinal artery occlusion; RT retinal tear; SB scleral buckle; PPV pars plana vitrectomy; VH Vitreous hemorrhage; PRP panretinal laser photocoagulation; IVK intravitreal kenalog; VMT vitreomacular traction; MH Macular hole;  NVD neovascularization of the disc; NVE neovascularization elsewhere; AREDS age related eye disease study; ARMD age related macular degeneration; POAG primary open angle glaucoma; EBMD epithelial/anterior basement membrane dystrophy; ACIOL anterior chamber intraocular lens; IOL intraocular lens; PCIOL posterior chamber intraocular lens; Phaco/IOL phacoemulsification with intraocular lens placement; [place] photorefractive keratectomy; LASIK laser assisted in situ keratomileusis; HTN hypertension; DM diabetes mellitus; COPD chronic obstructive pulmonary disease

## 2022-12-15 NOTE — Telephone Encounter (Signed)
No answer, unable to leave a message, B.Kolin Erdahl RN. 

## 2022-12-27 ENCOUNTER — Ambulatory Visit (INDEPENDENT_AMBULATORY_CARE_PROVIDER_SITE_OTHER): Payer: Commercial Managed Care - PPO | Admitting: Ophthalmology

## 2022-12-27 ENCOUNTER — Encounter (INDEPENDENT_AMBULATORY_CARE_PROVIDER_SITE_OTHER): Payer: Self-pay | Admitting: Ophthalmology

## 2022-12-27 DIAGNOSIS — H35033 Hypertensive retinopathy, bilateral: Secondary | ICD-10-CM | POA: Diagnosis not present

## 2022-12-27 DIAGNOSIS — I10 Essential (primary) hypertension: Secondary | ICD-10-CM | POA: Diagnosis not present

## 2022-12-27 DIAGNOSIS — H33103 Unspecified retinoschisis, bilateral: Secondary | ICD-10-CM | POA: Diagnosis not present

## 2022-12-27 DIAGNOSIS — H25813 Combined forms of age-related cataract, bilateral: Secondary | ICD-10-CM

## 2022-12-27 DIAGNOSIS — H35711 Central serous chorioretinopathy, right eye: Secondary | ICD-10-CM

## 2022-12-27 DIAGNOSIS — H353211 Exudative age-related macular degeneration, right eye, with active choroidal neovascularization: Secondary | ICD-10-CM | POA: Diagnosis not present

## 2022-12-27 MED ORDER — AFLIBERCEPT 2MG/0.05ML IZ SOLN FOR KALEIDOSCOPE
2.0000 mg | INTRAVITREAL | Status: AC | PRN
  Administered 2022-12-27: 2 mg via INTRAVITREAL

## 2023-01-05 ENCOUNTER — Ambulatory Visit: Payer: Commercial Managed Care - PPO | Admitting: Obstetrics & Gynecology

## 2023-01-21 NOTE — Progress Notes (Signed)
[facility] - Clinic Note  01/31/2023     CHIEF COMPLAINT Patient presents for Retina Follow Up   HISTORY OF PRESENT ILLNESS: [name] is a 52 y.o. female who presents to the clinic today for:  HPI     Retina Follow Up   Patient presents with  Other.  In right eye.  Severity is moderate.  Duration of 5 weeks.  Since onset it is stable.  I, the attending physician,  performed the HPI with the patient and updated documentation appropriately.        Comments   Pt here for 5 wk ret f/u CSCR OD. Pt states VA is doing well, no major changes.       Last edited by [name], MD on 01/31/2023  4:46 PM.    Pt states vision is stable, she has less stress at her job   Referring physician: [name], PA-C [address] [place],  [place] [address]  HISTORICAL INFORMATION:   Selected notes from the MEDICAL RECORD NUMBER Referred by Dr. Shawna Sullivan for concern of sub-macular fluid / retinoschisis   CURRENT MEDICATIONS: No current outpatient medications on file. (Ophthalmic Drugs)   No current facility-administered medications for this visit. (Ophthalmic Drugs)   Current Outpatient Medications (Other)  Medication Sig   diphenhydrAMINE HCl (BENADRYL ALLERGY PO) Take by mouth.   fexofenadine (ALLEGRA) 180 MG tablet Take 180 mg by mouth daily.   hydrochlorothiazide (HYDRODIURIL) 25 MG tablet TAKE 1 TABLET BY MOUTH ONCE DAILY IN THE MORNING FOR 90 DAYS   lisinopril (PRINIVIL,ZESTRIL) 20 MG tablet Take 1 tablet (20 mg total) by mouth daily.   eplerenone (INSPRA) 50 MG tablet Take 1 tablet (50 mg total) by mouth daily.   No current facility-administered medications for this visit. (Other)   REVIEW OF SYSTEMS: ROS   Positive for: Cardiovascular, Eyes Negative for: Constitutional, Gastrointestinal, Neurological, Skin, Genitourinary, Musculoskeletal, HENT, Endocrine, Respiratory, Psychiatric, Allergic/Imm, Heme/Lymph Last  edited by Thompson Grayer, COT on 01/31/2023  1:01 PM.     ALLERGIES Allergies  Allergen Reactions   Pollen Extract Itching   PAST MEDICAL HISTORY Past Medical History:  Diagnosis Date   Abnormal Pap smear of cervix    12-28-21 lgsil HPV HR +   Allergy    CIN I (cervical intraepithelial neoplasia I)    High risk HPV infection    Hypertension    Hypertensive retinopathy    OU   Missed ab AGUST 6 2014   Retina disorder 10/22/2019   pt states taking inspra for excess fluid in retina   Sciatic nerve injury    Past Surgical History:  Procedure Laterality Date   MYOMECTOMY  2008   FAMILY HISTORY Family History  Problem Relation Age of Onset   Hypertension Mother    Diabetes Father    Diabetes Maternal Grandmother    Hypertension Maternal Grandmother    Colon cancer Neg Hx    Colon polyps Neg Hx    Esophageal cancer Neg Hx    Rectal cancer Neg Hx    Stomach cancer Neg Hx    SOCIAL HISTORY Social History   Tobacco Use   Smoking status: Never   Smokeless tobacco: Never  Vaping Use   Vaping Use: Never used  Substance Use Topics   Alcohol use: No    Alcohol/week: 0.0 standard drinks of alcohol   Drug use: No       OPHTHALMIC EXAM: Base Eye Exam  Visual Acuity (Snellen - Linear)       Right Left   Dist cc 20/100 -2 20/20 -2   Dist ph cc 20/80 -3     Correction: Glasses         Tonometry (Tonopen, 1:07 PM)       Right Left   Pressure 14 12         Pupils       Dark Light Shape React APD   Right 3 2 Round Brisk None   Left 3 2 Round Brisk None         Visual Fields (Counting fingers)       Left Right    Full Full         Extraocular Movement       Right Left    Full, Ortho Full, Ortho         Neuro/Psych     Oriented x3: Yes   Mood/Affect: Normal         Dilation     Both eyes: 1.0% Mydriacyl, 2.5% Phenylephrine @ 1:07 PM           Slit Lamp and Fundus Exam     Slit Lamp Exam       Right Left    Lids/Lashes Dermatochalasis - upper lid Dermatochalasis - upper lid   Conjunctiva/Sclera Injection, nasal pterygium with +vessels 1+ Injection, nasal Pinguecula   Cornea Nasal ptergyium extending 2.74mm onto nasal cornea 1+Punctate epithelial erosions   Anterior Chamber Deep, clear, narrow temporal angle, No cell or flare deep, narrow temporal angle, 1+pigment   Iris Round and dilated Round and dilated   Lens 2+ Nuclear sclerosis, 2+ Cortical cataract 2+ Nuclear sclerosis, 2+ Cortical cataract   Anterior Vitreous Mild Vitreous syneresis, Posterior vitreous detachment, vitreous condensations Mild Vitreous syneresis         Fundus Exam       Right Left   Disc Pink and Sharp, +cupping Pink and Sharp, +cupping, shallow peripapillary SRF just nasal to disc -- resolved   C/D Ratio 0.65 0.6   Macula Flat, Blunted foveal reflex, central atrophy with mild interval improvement in SRF centrally, RPE mottling, clumping and atrophy, No heme Flat, good foveal reflex, Epiretinal membrane, mild RPE mottling and clumping superiorly, No heme or edema   Vessels attenuated, Tortuous Vascular attenuation, mild Tortuousity   Periphery Attached, bullous Retinoschisis cavity from 0700-0900 -- no associated RT/RD - stable from prior, shallow schisis cavity ST, mild peripheral cystoid degeneration, pigmented cystoid degeneration inferiorly Attached, temporal retinoschisis cavity from 0300-0430 peripherally -- stable from prior, pigmented cystoid degeneration inferiorly           Refraction     Wearing Rx       Sphere Cylinder Axis   Right +1.25 +1.25 112   Left +2.25 +1.00 101           IMAGING AND PROCEDURES  Imaging and Procedures for @  OCT, Retina - OU - Both Eyes       Right Eye Quality was good. Central Foveal Thickness: 234. Progression has improved. Findings include normal foveal contour, no IRF, intraretinal hyper-reflective material, subretinal fluid, outer retinal atrophy,  vitreomacular adhesion (stable improvement in foveal contour, interval improvement in central SRF and IRF -- just trace sliver of SRF remain, persistent ORA, persistent bullous schisis caught IT periphery on widefield -- stable from prior ).   Left Eye Quality was good. Central Foveal Thickness: 306. Progression has improved. Findings include normal foveal  contour, no IRF, subretinal fluid, vitreomacular adhesion (Small pocket of SRF nasal disc caught on widefield -- resolved, +schisis IT quad caught on widefield).   Notes *Images captured and stored on drive  Diagnosis / Impression:  OD: stable improvement in foveal contour, interval improvement in central SRF and IRF -- just trace sliver of SRF remain, persistent ORA, persistent bullous schisis caught IT periphery on widefield -- stable from prior  OS: NFP, no IRF/SRF centrally, +VMA; Small pocket of SRF nasal disc caught on widefield -- resolved, +schisis IT quad caught on widefield  Clinical management:  See below  Abbreviations: NFP - Normal foveal profile. CME - cystoid macular edema. PED - pigment epithelial detachment. IRF - intraretinal fluid. SRF - subretinal fluid. EZ - ellipsoid zone. ERM - epiretinal membrane. ORA - outer retinal atrophy. ORT - outer retinal tubulation. SRHM - subretinal hyper-reflective material      Intravitreal Injection, Pharmacologic Agent - OD - Right Eye       Time Out 01/31/2023. 1:38 PM. Confirmed correct patient, procedure, site, and patient consented.   Anesthesia Topical anesthesia was used. Anesthetic medications included Lidocaine 2%, Proparacaine 0.5%.   Procedure Preparation included 5% betadine to ocular surface, eyelid speculum. A (32g) needle was used.   Injection: 2 mg aflibercept 2 MG/0.05ML   Route: Intravitreal, Site: Right Eye   NDC: L6038910, Lot: [id number], Expiration date: 01/09/2024, Waste: 0 mL   Post-op Post injection exam found visual acuity of at least counting  fingers. The patient tolerated the procedure well. There were no complications. The patient received written and verbal post procedure care education. Post injection medications were not given.      Color Fundus Photography Optos - OU - Both Eyes       Right Eye Progression has worsened. Disc findings include normal observations. Macula : geographic atrophy, retinal pigment epithelium abnormalities (Central RPE atrophy -- worse than prior). Vessels : normal observations. Periphery : RPE abnormality (Bullous IT schisis cavity from 7-9 oclock with ?mild posterior extension compared to prior).   Left Eye Progression has worsened. Disc findings include normal observations. Vessels : normal observations. Periphery : RPE abnormality (IT schisis cavity -- increased in comparison to prior).   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 Exudative age-related macular degeneration of right eye with active choroidal neovascularization  H35.3211 OCT, Retina - OU - Both Eyes    Intravitreal Injection, Pharmacologic Agent - OD - Right Eye    aflibercept (EYLEA) SOLN 2 mg    3. Bilateral retinoschisis  H33.103 OCT, Retina - OU - Both Eyes    Color Fundus Photography Optos - OU - Both Eyes    4. Essential hypertension  I10     5. Hypertensive retinopathy of both eyes  H35.033     6. Combined forms of age-related cataract of both eyes  H25.813       1,2. CSCR OD -- chronic  - pt reports decreased vision OD since December 2019  - reports significant stressors in life -- work  - denies steroid use  - FA (09.30.20) shows focal hyperfluorescent  leakage points  nasal macula OD -- expansile dot phenotype?  - started 50 mg po eplerenone since 09.30.20  -- pt self d/c in February, re-started in June 2021  - s/p IVA OD #1 (09.01.21), #2 (09.29.21), #3 (11.03.21), #4 (12.03.21) -- IVA resistance - s/p IVE OD #1 (01.20.22), #2 (02.18.22), #3 (04.05.22-sample), #4 (05.23.22), # 5 (06.27.22), #6 (07.28.22), #7 (08.26.22), #8 (09.23.22), #9 (10.21.22), #10 (11.18.22), #11 (12.16.22), #12 (01.20.23), #13 (03.02.23), #14 (04.07.23), #15 (05.12.23), #16 (06.09.23), #17 (07.14.23), #18 (08.08.23), #19 (09.08.23), #20 (10.06.23), #21 (11.06.23), #22 (12.04.23), #23 (01.08.24), #24 (02.12.24), #25 (03.18.24)  - BCVA OD 20/80  - OCT shows OD: stable improvement in foveal contour, interval improvement in central SRF and IRF -- just trace sliver of SRF remain, persistent ORA, persistent bullous schisis caught IT periphery on widefield -- stable from prior; OS: NFP, no IRF/SRF centrally, +VMA; Small pocket of SRF nasal disc caught on widefield -- resolved, +schisis IT quad caught on widefield at 5 weeks  - differential includes atypical exudative ARMD  - recommend IVE OD #26 today, 04.22.24 with f/u in 5 weeks  - RBA of procedure discussed, questions answered  - IVA informed consent obtained and signed 09.01.21  - IVE informed consent obtained and re-signed, 03.18.24  - see procedure note   - [employer]yle[employer] benefits investigation initiated 12.3.21 -- approved for 2023  - continue po eplerenone 50 mg daily  - f/u 5 weeks -- DFE/OCT, possible injection -- scan through schisis OU, color optos, FAF for schisis  3. Retinoschisis OU (OD > OS) -- stable  - bullous retinoschisis cavity IT quadrant OD-- 1610-9604 oclock  - bullous retinoschisis cavity IT quadrant OS -- 5409-8119 oclock  - no associated retinal holes/RT/RD on scleral depression    - widefield Optos imaging today shows mild interval progression of schisis cavities sizde and posterior extension in comparison to baseline  images  - discussed findings and prognosis   - recommend non-urgent laser retinopexy of schisis cavities OU -- OS first  4,5. Hypertensive retinopathy OU  - discussed importance of tight BP control  - monitor  6. 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  - discussed diagnosis and progression  - monitor  Ophthalmic Meds Ordered this visit:  Meds ordered this encounter  Medications   aflibercept (EYLEA) SOLN 2 mg     Return in about 5 weeks (around 03/07/2023) for f/u exu ARMD OU, DFE, FA.  There are no Patient Instructions on file for this visit.  This document serves as a record of services personally performed by Karie Chimera, MD, PhD. It was created on their behalf by De Blanch, an ophthalmic technician. The creation of this record is the provider's dictation and/or activities during the visit.    Electronically signed by: De Blanch, OA, 02/01/23  11:56 PM  This document serves as a record of services personally performed by Karie Chimera, MD, PhD. It was created on their behalf by Glee Arvin. Manson Passey, OA an ophthalmic technician. The creation of this record is the provider's dictation and/or activities during the visit.    Electronically signed by: Glee Arvin. Manson Passey, [place] 04.22.2024 11:56 PM   Karie Chimera, M.D., Ph.D. Diseases & Surgery of the Retina and Vitreous [facility]  I have reviewed the above documentation for accuracy and completeness, and I agree with the above. Karie Chimera, M.D., Ph.D. 02/02/23  12:00 AM   Abbreviations: M myopia (nearsighted); A astigmatism; H hyperopia (farsighted); P presbyopia; Mrx spectacle prescription;  CTL contact lenses; OD right eye; OS left eye; OU both eyes  XT exotropia; ET esotropia; PEK punctate epithelial keratitis; PEE punctate epithelial erosions; DES dry eye syndrome; MGD meibomian gland  dysfunction; ATs artificial tears; PFAT's preservative free artificial tears; NSC nuclear sclerotic cataract; PSC posterior subcapsular cataract; ERM epi-retinal membrane; PVD posterior vitreous detachment; RD retinal detachment; DM diabetes mellitus; DR diabetic retinopathy; NPDR non-proliferative diabetic retinopathy; PDR proliferative diabetic retinopathy; CSME clinically significant macular edema; DME diabetic macular edema; dbh dot blot hemorrhages; CWS cotton wool spot; POAG primary open angle glaucoma; C/D cup-to-disc ratio; HVF humphrey visual field; GVF goldmann visual field; OCT optical coherence tomography; IOP intraocular pressure; BRVO Branch retinal vein occlusion; CRVO central retinal vein occlusion; CRAO central retinal artery occlusion; BRAO branch retinal artery occlusion; RT retinal tear; SB scleral buckle; PPV pars plana vitrectomy; VH Vitreous hemorrhage; PRP panretinal laser photocoagulation; IVK intravitreal kenalog; VMT vitreomacular traction; MH Macular hole;  NVD neovascularization of the disc; NVE neovascularization elsewhere; AREDS age related eye disease study; ARMD age related macular degeneration; POAG primary open angle glaucoma; EBMD epithelial/anterior basement membrane dystrophy; ACIOL anterior chamber intraocular lens; IOL intraocular lens; PCIOL posterior chamber intraocular lens; Phaco/IOL phacoemulsification with intraocular lens placement; PRK photorefractive keratectomy; LASIK laser assisted in situ keratomileusis; HTN hypertension; DM diabetes mellitus; COPD chronic obstructive pulmonary disease

## 2023-01-28 ENCOUNTER — Other Ambulatory Visit: Payer: Self-pay | Admitting: Obstetrics & Gynecology

## 2023-01-28 DIAGNOSIS — Z1231 Encounter for screening mammogram for malignant neoplasm of breast: Secondary | ICD-10-CM

## 2023-01-28 DIAGNOSIS — K641 Second degree hemorrhoids: Secondary | ICD-10-CM | POA: Insufficient documentation

## 2023-01-28 DIAGNOSIS — D122 Benign neoplasm of ascending colon: Secondary | ICD-10-CM | POA: Insufficient documentation

## 2023-01-31 ENCOUNTER — Encounter (INDEPENDENT_AMBULATORY_CARE_PROVIDER_SITE_OTHER): Payer: Self-pay | Admitting: Ophthalmology

## 2023-01-31 ENCOUNTER — Ambulatory Visit (INDEPENDENT_AMBULATORY_CARE_PROVIDER_SITE_OTHER): Payer: Commercial Managed Care - PPO | Admitting: Ophthalmology

## 2023-01-31 DIAGNOSIS — I10 Essential (primary) hypertension: Secondary | ICD-10-CM

## 2023-01-31 DIAGNOSIS — H33103 Unspecified retinoschisis, bilateral: Secondary | ICD-10-CM | POA: Diagnosis not present

## 2023-01-31 DIAGNOSIS — H35711 Central serous chorioretinopathy, right eye: Secondary | ICD-10-CM | POA: Diagnosis not present

## 2023-01-31 DIAGNOSIS — H35033 Hypertensive retinopathy, bilateral: Secondary | ICD-10-CM

## 2023-01-31 DIAGNOSIS — H25813 Combined forms of age-related cataract, bilateral: Secondary | ICD-10-CM

## 2023-01-31 DIAGNOSIS — H353211 Exudative age-related macular degeneration, right eye, with active choroidal neovascularization: Secondary | ICD-10-CM

## 2023-01-31 MED ORDER — AFLIBERCEPT 2MG/0.05ML IZ SOLN FOR KALEIDOSCOPE
2.0000 mg | INTRAVITREAL | Status: AC | PRN
  Administered 2023-01-31: 2 mg via INTRAVITREAL

## 2023-02-02 ENCOUNTER — Ambulatory Visit
Admission: RE | Admit: 2023-02-02 | Discharge: 2023-02-02 | Disposition: A | Discharge status: Home / Self Care | Payer: Commercial Managed Care - PPO | Source: Ambulatory Visit | Attending: Obstetrics & Gynecology | Admitting: Obstetrics & Gynecology

## 2023-02-02 DIAGNOSIS — Z1231 Encounter for screening mammogram for malignant neoplasm of breast: Secondary | ICD-10-CM

## 2023-02-28 NOTE — Progress Notes (Signed)
[facility] - Clinic Note  03/08/2023     CHIEF COMPLAINT Patient presents for Retina Follow Up   HISTORY OF PRESENT ILLNESS: [name] is a 52 y.o. female who presents to the clinic today for:  HPI     Retina Follow Up   Patient presents with  Other.  In right eye.  Severity is moderate.  Duration of 5 weeks.  Since onset it is stable.  I, the attending physician,  performed the HPI with the patient and updated documentation appropriately.        Comments   Patient feels that the vision is doing well. She is not using eye drops.       Last edited by [name], MD on 03/08/2023  4:30 PM.    Patient feels that the vision is the same.   Referring physician: [name], PA-C [id number] [place],  [place] [address]  HISTORICAL INFORMATION:   Selected notes from the MEDICAL RECORD NUMBER Referred by Dr. [name] for concern of sub-macular fluid / retinoschisis   CURRENT MEDICATIONS: No current outpatient medications on file. (Ophthalmic Drugs)   No current facility-administered medications for this visit. (Ophthalmic Drugs)   Current Outpatient Medications (Other)  Medication Sig   diphenhydrAMINE HCl (BENADRYL ALLERGY PO) Take by mouth.   fexofenadine (ALLEGRA) 180 MG tablet Take 180 mg by mouth daily.   hydrochlorothiazide (HYDRODIURIL) 25 MG tablet TAKE 1 TABLET BY MOUTH ONCE DAILY IN THE MORNING FOR 90 DAYS   lisinopril (PRINIVIL,ZESTRIL) 20 MG tablet Take 1 tablet (20 mg total) by mouth daily.   eplerenone (INSPRA) 50 MG tablet Take 1 tablet (50 mg total) by mouth daily.   No current facility-administered medications for this visit. (Other)   REVIEW OF SYSTEMS: ROS   Positive for: Cardiovascular, Eyes Negative for: Constitutional, Gastrointestinal, Neurological, Skin, Genitourinary, Musculoskeletal, HENT, Endocrine, Respiratory, Psychiatric, Allergic/Imm, Heme/Lymph Last edited by Julieanne Cotton, COT on 03/08/2023 12:56 PM.      ALLERGIES Allergies  Allergen Reactions   Pollen Extract Itching   PAST MEDICAL HISTORY Past Medical History:  Diagnosis Date   Abnormal Pap smear of cervix    12-28-21 lgsil HPV HR +   Allergy    CIN I (cervical intraepithelial neoplasia I)    High risk HPV infection    Hypertension    Hypertensive retinopathy    OU   Missed ab AGUST 6 2014   Retina disorder 10/22/2019   pt states taking inspra for excess fluid in retina   Sciatic nerve injury    Past Surgical History:  Procedure Laterality Date   MYOMECTOMY  2008   FAMILY HISTORY Family History  Problem Relation Age of Onset   Hypertension Mother    Diabetes Father    Diabetes Maternal Grandmother    Hypertension Maternal Grandmother    Colon cancer Neg Hx    Colon polyps Neg Hx    Esophageal cancer Neg Hx    Rectal cancer Neg Hx    Stomach cancer Neg Hx    SOCIAL HISTORY Social History   Tobacco Use   Smoking status: Never   Smokeless tobacco: Never  Vaping Use   Vaping Use: Never used  Substance Use Topics   Alcohol use: No    Alcohol/week: 0.0 standard drinks of alcohol   Drug use: No       OPHTHALMIC EXAM: Base Eye Exam     Visual Acuity (Snellen -  Linear)       Right Left   Dist cc 20/80 -2 20/20   Dist ph cc NI     Correction: Glasses         Tonometry (Tonopen, 12:59 PM)       Right Left   Pressure 10 12         Pupils       Dark Light Shape React APD   Right 3 2 Round Brisk None   Left 3 2 Round Brisk None         Visual Fields       Left Right    Full Full         Extraocular Movement       Right Left    Full, Ortho Full, Ortho         Neuro/Psych     Oriented x3: Yes   Mood/Affect: Normal         Dilation     Both eyes: 2.5% Phenylephrine, 1.0% Mydriacyl @ 12:56 PM           Slit Lamp and Fundus Exam     Slit Lamp Exam       Right Left   Lids/Lashes Dermatochalasis - upper lid  Dermatochalasis - upper lid   Conjunctiva/Sclera Injection, nasal pterygium with +vessels 1+ Injection, nasal Pinguecula   Cornea Nasal ptergyium extending 2.71mm onto nasal cornea 1+Punctate epithelial erosions   Anterior Chamber Deep, clear, narrow temporal angle, No cell or flare deep, narrow temporal angle, 1+pigment   Iris Round and dilated Round and dilated   Lens 2+ Nuclear sclerosis, 2+ Cortical cataract 2+ Nuclear sclerosis, 2+ Cortical cataract   Anterior Vitreous Mild Vitreous syneresis, Posterior vitreous detachment, vitreous condensations Mild Vitreous syneresis         Fundus Exam       Right Left   Disc Pink and Sharp, +cupping Pink and Sharp, +cupping, shallow peripapillary SRF just nasal to disc -- stably resolved   C/D Ratio 0.65 0.6   Macula Flat, Blunted foveal reflex, persistent SRF, RPE mottling, clumping and atrophy, No heme Flat, good foveal reflex, Epiretinal membrane, mild RPE mottling and clumping superiorly, No heme or edema   Vessels attenuated, Tortuous Vascular attenuation, mild Tortuousity   Periphery Attached, bullous Retinoschisis cavity from 0700-0900 -- no associated RT/RD - stable from prior, shallow schisis cavity ST, mild peripheral cystoid degeneration, pigmented cystoid degeneration inferiorly Attached, temporal retinoschisis cavity from 0300-0430 peripherally -- stable from prior, pigmented cystoid degeneration inferiorly           Refraction     Wearing Rx       Sphere Cylinder Axis   Right +1.25 +1.25 112   Left +2.25 +1.00 101           IMAGING AND PROCEDURES  Imaging and Procedures for @TODAY @  OCT, Retina - OU - Both Eyes       Right Eye Quality was good. Central Foveal Thickness: 241. Progression has been stable. Findings include normal foveal contour, no IRF, intraretinal hyper-reflective material, subretinal fluid, outer retinal atrophy, vitreomacular adhesion (OD: stable improvement in foveal contour, stable improvement in  IRF; trace sliver of central SRF-- persistent, + ORA, persistent bullous schisis IT periphery caught on widefield -- stable from prior).   Left Eye Quality was good. Central Foveal Thickness: 301. Progression has been stable. Findings include normal foveal contour, no IRF, no SRF, subretinal fluid, vitreomacular adhesion (Small pocket of SRF nasal disc caught  on widefield -- stably resolved, +schisis IT quad not imaged today).   Notes *Images captured and stored on drive  Diagnosis / Impression:  OD: stable improvement in foveal contour, stable improvement in IRF; trace sliver of central SRF-- persistent, + ORA, persistent bullous schisis IT periphery caught on widefield -- stable from prior  OS: NFP, no IRF/SRF centrally, +VMA; Small pocket of SRF nasal disc caught on widefield -- stably resolved, +schisis IT quad not imaged today  Clinical management:  See below  Abbreviations: NFP - Normal foveal profile. CME - cystoid macular edema. PED - pigment epithelial detachment. IRF - intraretinal fluid. SRF - subretinal fluid. EZ - ellipsoid zone. ERM - epiretinal membrane. ORA - outer retinal atrophy. ORT - outer retinal tubulation. SRHM - subretinal hyper-reflective material      Intravitreal Injection, Pharmacologic Agent - OD - Right Eye       Time Out 03/08/2023. 2:06 PM. Confirmed correct patient, procedure, site, and patient consented.   Anesthesia Topical anesthesia was used. Anesthetic medications included Lidocaine 2%, Proparacaine 0.5%.   Procedure Preparation included 5% betadine to ocular surface, eyelid speculum. A (32g) needle was used.   Injection: 2 mg aflibercept 2 MG/0.05ML   Route: Intravitreal, Site: Right Eye   NDC: L6038910, Lot: [id number], Expiration date: 02/08/2024, Waste: 0 mL   Post-op Post injection exam found visual acuity of at least counting fingers. The patient tolerated the procedure well. There were no complications. The patient received written  and verbal post procedure care education. Post injection medications were not given.            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 Essential hypertension  I10     5. Hypertensive retinopathy of both eyes  H35.033     6. Combined forms of age-related cataract of both eyes  H25.813      1,2. CSCR OD -- chronic  - pt reports decreased vision OD since December 2019  - reports significant stressors in life -- work  - denies steroid use - FA (09.30.20) shows focal hyperfluorescent leakage points nasal macula OD -- expansile dot phenotype? - started 50 mg po eplerenone since 09.30.20  -- pt self d/c in February, re-started in June 2021 - s/p IVA OD #1 (09.01.21), #2 (09.29.21), #3 (11.03.21), #4 (12.03.21) -- IVA resistance ============================================================ - s/p IVE OD #1 (01.20.22), #2 (02.18.22), #3 (04.05.22-sample), #4 (05.23.22), # 5 (06.27.22), #6 (07.28.22), #7 (08.26.22), #8 (09.23.22), #9 (10.21.22), #10 (11.18.22), #11 (12.16.22), #12 (01.20.23), #13 (03.02.23), #14 (04.07.23), #15 (05.12.23), #16 (06.09.23), #17 (07.14.23), #18 (08.08.23), #19 (09.08.23), #20 (10.06.23), #21 (11.06.23), #22 (12.04.23), #23 (01.08.24), #24 (02.12.24), #25 (03.18.24), #26 (04.22.24)  - BCVA OD 20/80- stable - OCT shows OD: stable improvement in foveal contour, stable improvement in IRF; trace sliver of central SRF-- persistent, + ORA, persistent bullous schisis caught IT periphery on widefield -- stable from prior OS: NFP, no  IRF/SRF centrally, +VMA; Small pocket of SRF nasal disc caught on widefield -- resolved, +  schisis IT quad caught on widefield at 5 weeks  - differential includes atypical exudative ARMD  - recommend IVE OD #27 today, 05.20.24 with f/u in 5 weeks  - RBA of procedure discussed, questions answered  - IVA informed consent obtained and signed 09.01.21  - IVE informed consent obtained and re-signed, 03.18.24  - see procedure note   - Eylea[employer] benefits investigation initiated 12.3.21 -- approved for 2023  - continue po eplerenone 50 mg daily  - f/u 5 weeks -- DFE/OCT, possible injection  3. Retinoschisis OU (OD > OS) -- stable  - bullous retinoschisis cavity IT quadrant OD-- 0981-1914 oclock  - bullous retinoschisis cavity IT quadrant OS -- 7829-5621 oclock  - no associated retinal holes/RT/RD on scleral depression   - widefield [facility] imaging (04.22.24) shows mild interval progression of schisis cavities' size and posterior extension in comparison to baseline images  - discussed findings and prognosis   - recommend non-urgent laser retinopexy of schisis cavities OU -- OS first-- July/Aug 2024  4,5. Hypertensive retinopathy OU  - discussed importance of tight BP control  - monitor  6. Mixed form age related cataracts OU - The symptoms of cataract, surgical options, and treatments and risks were discussed with patient.  - discussed diagnosis and progression  - monitor  Ophthalmic Meds Ordered this visit:  Meds ordered this encounter  Medications   aflibercept (EYLEA) SOLN 2 mg     Return in about 5 weeks (around 04/12/2023) for f/u exu ARMD OD, DFE, OCT, .  There are no Patient Instructions on file for this visit.  This document serves as a record of services personally performed by Karie Chimera, MD, PhD. It was created on their behalf by Gerilyn Nestle, COT an ophthalmic technician. The creation of this record is the provider's dictation and/or activities during the visit.     Electronically signed by:  Gerilyn Nestle, COT  5.20.24 5:44 PM  This document serves as a record of services personally performed by Karie Chimera, MD, PhD. It was created on their behalf by Glee Arvin. Manson Passey, OA an ophthalmic technician. The creation of this record is the provider's dictation and/or activities during the visit.    Electronically signed by: Glee Arvin. Manson Passey, OA @TODAY @ 5:44 PM  Karie Chimera, M.D., Ph.D. Diseases & Surgery of the Retina and Vitreous [facility]  I have reviewed the above documentation for accuracy and completeness, and I agree with the above. Karie Chimera, M.D., Ph.D. 03/10/23 5:46 PM  Abbreviations: M myopia (nearsighted); A astigmatism; H hyperopia (farsighted); P presbyopia; Mrx spectacle prescription;  CTL contact lenses; OD right eye; OS left eye; OU both eyes  XT exotropia; ET esotropia; PEK punctate epithelial keratitis; PEE punctate epithelial erosions; DES dry eye syndrome; MGD meibomian gland dysfunction; ATs artificial tears; PFAT's preservative free artificial tears; NSC nuclear sclerotic cataract; PSC posterior subcapsular cataract; ERM epi-retinal membrane; PVD posterior vitreous detachment; RD retinal detachment; DM diabetes mellitus; DR diabetic retinopathy; NPDR non-proliferative diabetic retinopathy; PDR proliferative diabetic retinopathy; CSME clinically significant macular edema; DME diabetic macular edema; dbh dot blot hemorrhages; CWS cotton wool spot; POAG primary open angle glaucoma; C/D cup-to-disc ratio; HVF humphrey visual field; GVF goldmann visual field; OCT optical coherence tomography; IOP intraocular pressure; BRVO Branch retinal vein occlusion; CRVO central retinal vein occlusion; CRAO central retinal artery occlusion; BRAO branch retinal artery occlusion; RT retinal tear; SB scleral buckle; PPV pars plana vitrectomy; VH Vitreous hemorrhage; PRP panretinal laser photocoagulation; IVK  intravitreal  kenalog; VMT vitreomacular traction; MH Macular hole;  NVD neovascularization of the disc; NVE neovascularization elsewhere; AREDS age related eye disease study; ARMD age related macular degeneration; POAG primary open angle glaucoma; EBMD epithelial/anterior basement membrane dystrophy; ACIOL anterior chamber intraocular lens; IOL intraocular lens; PCIOL posterior chamber intraocular lens; Phaco/IOL phacoemulsification with intraocular lens placement; PRK photorefractive keratectomy; LASIK laser assisted in situ keratomileusis; HTN hypertension; DM diabetes mellitus; COPD chronic obstructive pulmonary disease

## 2023-03-08 ENCOUNTER — Encounter (INDEPENDENT_AMBULATORY_CARE_PROVIDER_SITE_OTHER): Payer: Self-pay | Admitting: Ophthalmology

## 2023-03-08 ENCOUNTER — Ambulatory Visit (INDEPENDENT_AMBULATORY_CARE_PROVIDER_SITE_OTHER): Payer: Commercial Managed Care - PPO | Admitting: Ophthalmology

## 2023-03-08 DIAGNOSIS — H35033 Hypertensive retinopathy, bilateral: Secondary | ICD-10-CM

## 2023-03-08 DIAGNOSIS — H35711 Central serous chorioretinopathy, right eye: Secondary | ICD-10-CM | POA: Diagnosis not present

## 2023-03-08 DIAGNOSIS — H25813 Combined forms of age-related cataract, bilateral: Secondary | ICD-10-CM

## 2023-03-08 DIAGNOSIS — I10 Essential (primary) hypertension: Secondary | ICD-10-CM | POA: Diagnosis not present

## 2023-03-08 DIAGNOSIS — H353211 Exudative age-related macular degeneration, right eye, with active choroidal neovascularization: Secondary | ICD-10-CM | POA: Diagnosis not present

## 2023-03-08 DIAGNOSIS — H33103 Unspecified retinoschisis, bilateral: Secondary | ICD-10-CM | POA: Diagnosis not present

## 2023-03-08 MED ORDER — AFLIBERCEPT 2MG/0.05ML IZ SOLN FOR KALEIDOSCOPE
2.0000 mg | INTRAVITREAL | Status: AC | PRN
Start: 2023-03-08 — End: 2023-03-08
  Administered 2023-03-08: 2 mg via INTRAVITREAL

## 2023-03-11 ENCOUNTER — Other Ambulatory Visit (HOSPITAL_COMMUNITY)
Admission: RE | Admit: 2023-03-11 | Discharge: 2023-03-11 | Disposition: A | Discharge status: Home / Self Care | Payer: Commercial Managed Care - PPO | Source: Ambulatory Visit | Attending: Obstetrics & Gynecology | Admitting: Obstetrics & Gynecology

## 2023-03-11 ENCOUNTER — Encounter: Payer: Self-pay | Admitting: Obstetrics & Gynecology

## 2023-03-11 ENCOUNTER — Ambulatory Visit (INDEPENDENT_AMBULATORY_CARE_PROVIDER_SITE_OTHER): Payer: Commercial Managed Care - PPO | Admitting: Obstetrics & Gynecology

## 2023-03-11 VITALS — BP 110/80 | HR 97 | Ht 63.75 in | Wt 163.0 lb

## 2023-03-11 DIAGNOSIS — R87810 Cervical high risk human papillomavirus (HPV) DNA test positive: Secondary | ICD-10-CM

## 2023-03-11 DIAGNOSIS — N951 Menopausal and female climacteric states: Secondary | ICD-10-CM

## 2023-03-11 DIAGNOSIS — N870 Mild cervical dysplasia: Secondary | ICD-10-CM | POA: Insufficient documentation

## 2023-03-11 DIAGNOSIS — Z789 Other specified health status: Secondary | ICD-10-CM

## 2023-03-11 DIAGNOSIS — Z01419 Encounter for gynecological examination (general) (routine) without abnormal findings: Secondary | ICD-10-CM | POA: Diagnosis present

## 2023-03-11 MED ORDER — PROGESTERONE MICRONIZED 100 MG PO CAPS: 100.0000 mg | ORAL_CAPSULE | Freq: Every day | ORAL | 4 refills | Status: DC

## 2023-03-11 MED ORDER — ESTRADIOL 0.05 MG/24HR TD PTTW
1.0000 | MEDICATED_PATCH | TRANSDERMAL | 4 refills | Status: DC
Start: 2023-03-14 — End: 2023-09-12

## 2023-03-11 NOTE — Progress Notes (Signed)
[name] 20-Nov-1970 [id number]   History:    52 y.o.  G1P0A1 Boyfriend x 7 yrs   RP:  Established patient presenting for annual gyn exam with repeat Pap test   HPI: Menopause x 1 year.  Well on no HRT.  No PMB.  C/O severe hot flushes and night sweats, wants to start on HRT.  No h/o DVT/PE/Stroke.  No first degree relative with Breast Ca.  No pelvic pain. Using condoms. No pain with IC except feels a little dry. Pap 12/2021 LGSIL with HPV HR Pos. HPV 16-18-45 Neg.  Colpo 01/2022 Mild Dysplasia/CIN 1.  Pap/HPV HR today. Breasts normal.  Mammo 01/2023 Neg.  BMI 28.2.  Planning to walk everyday. Colono 12/2022. Health labs with Fam MD.       Past medical history,surgical history, family history and social history were all reviewed and documented in the EPIC chart.  Gynecologic History Patient's last menstrual period was 02/18/2022.  Obstetric History OB History  Gravida Para Term Preterm AB Living  1 0     1 0  SAB IAB Ectopic Multiple Live Births  1            # Outcome Date GA Lbr Len/2nd Weight Sex Delivery Anes PTL Lv  1 SAB              ROS: A ROS was performed and pertinent positives and negatives are included in the history. GENERAL: No fevers or chills. HEENT: No change in vision, no earache, sore throat or sinus congestion. NECK: No pain or stiffness. CARDIOVASCULAR: No chest pain or pressure. No palpitations. PULMONARY: No shortness of breath, cough or wheeze. GASTROINTESTINAL: No abdominal pain, nausea, vomiting or diarrhea, melena or bright red blood per rectum. GENITOURINARY: No urinary frequency, urgency, hesitancy or dysuria. MUSCULOSKELETAL: No joint or muscle pain, no back pain, no recent trauma. DERMATOLOGIC: No rash, no itching, no lesions. ENDOCRINE: No polyuria, polydipsia, no heat or cold intolerance. No recent change in weight. HEMATOLOGICAL: No anemia or easy bruising or bleeding. NEUROLOGIC: No headache, seizures, numbness, tingling or weakness.  PSYCHIATRIC: No depression, no loss of interest in normal activity or change in sleep pattern.     Exam:   BP 110/80   Pulse 97   Ht 5' 3.75" (1.619 m)   Wt 163 lb (73.9 kg)   LMP 02/18/2022 Comment: sexually active, condoms  SpO2 99%   BMI 28.20 kg/m   Body mass index is 28.2 kg/m.  General appearance : Well developed well nourished female. No acute distress HEENT: Eyes: no retinal hemorrhage or exudates,  Neck supple, trachea midline, no carotid bruits, no thyroidmegaly Lungs: Clear to auscultation, no rhonchi or wheezes, or rib retractions  Heart: Regular rate and rhythm, no murmurs or gallops Breast:Examined in sitting and supine position were symmetrical in appearance, no palpable masses or tenderness,  no skin retraction, no nipple inversion, no nipple discharge, no skin discoloration, no axillary or supraclavicular lymphadenopathy Abdomen: no palpable masses or tenderness, no rebound or guarding Extremities: no edema or skin discoloration or tenderness  Pelvic: Vulva: Normal             Vagina: No gross lesions or discharge  Cervix: No gross lesions or discharge.  Pap/HPV HR done.  Uterus  AV, normal size, shape and consistency, non-tender and mobile  Adnexa  Without masses or tenderness  Anus: Normal   Assessment/Plan:  52 y.o. female for annual exam   1. Encounter for gynecological examination with  Papanicolaou smear of cervix Menopause x 1 year.  Well on no HRT.  No PMB.  C/O severe hot flushes and night sweats, wants to start on HRT.  No h/o DVT/PE/Stroke.  No first degree relative with Breast Ca.  No pelvic pain. Using condoms. No pain with IC except feels a little dry. Pap 12/2021 LGSIL with HPV HR Pos. HPV 16-18-45 Neg.  Colpo 01/2022 Mild Dysplasia/CIN 1.  Pap/HPV HR today. Breasts normal.  Mammo 01/2023 Neg.  BMI 28.2.  Planning to walk everyday. Colono 12/2022. Health labs with Fam MD.   - Cytology - PAP( [place])  2. Dysplasia of cervix, low grade (CIN 1) -  Cytology - PAP( [place])  3. Cervical high risk HPV (human papillomavirus) test positive - Cytology - PAP( [place])  4. Menopausal syndrome Menopause x 1 year.  Well on no HRT.  No PMB.  C/O severe hot flushes and night sweats, wants to start on HRT.  No h/o DVT/PE/Stroke.  No first degree relative with Breast Ca.  Counseling on HRT done.  Risks/benefits/usage reviewed.  Decision to start on Estradiol patch 0.05 twice a week and Prometrium 100 mg HS.  Prescriptions sent to pharmacy.  5. Use of condoms for contraception  Other orders - estradiol (VIVELLE-DOT) 0.05 MG/24HR patch; Place 1 patch (0.05 mg total) onto the skin 2 (two) times a week. - progesterone (PROMETRIUM) 100 MG capsule; Take 1 capsule (100 mg total) by mouth at bedtime.   Genia Del MD, 4:18 PM

## 2023-03-15 ENCOUNTER — Telehealth: Payer: Self-pay

## 2023-03-15 NOTE — Telephone Encounter (Signed)
Pt called to report prescriptions for HRTs being sent to wrong [facility] location.   Spoke w/ pt's preferred [facility] location and they advised me that they have the progesterone in stock to fill for her but not the patches.   Sent to spanish pool to relay information to pt to see how she would like to proceed.   Per CS: No answer and unable to LVM.

## 2023-03-17 LAB — CYTOLOGY - PAP
Comment: NEGATIVE
High risk HPV: POSITIVE — AB

## 2023-03-18 NOTE — Telephone Encounter (Signed)
Pt called back today and LVM in triage line w/ only her demographic info, no message as to the reason for her call.   Per CS: "I left detailed message and ask for a call back. I told her to ask for me or Bianca."

## 2023-04-12 ENCOUNTER — Ambulatory Visit (INDEPENDENT_AMBULATORY_CARE_PROVIDER_SITE_OTHER): Payer: Commercial Managed Care - PPO | Admitting: Ophthalmology

## 2023-04-12 ENCOUNTER — Encounter (INDEPENDENT_AMBULATORY_CARE_PROVIDER_SITE_OTHER): Payer: Self-pay | Admitting: Ophthalmology

## 2023-04-12 DIAGNOSIS — H353211 Exudative age-related macular degeneration, right eye, with active choroidal neovascularization: Secondary | ICD-10-CM

## 2023-04-12 DIAGNOSIS — H35711 Central serous chorioretinopathy, right eye: Secondary | ICD-10-CM | POA: Diagnosis not present

## 2023-04-12 DIAGNOSIS — H35033 Hypertensive retinopathy, bilateral: Secondary | ICD-10-CM

## 2023-04-12 DIAGNOSIS — H33103 Unspecified retinoschisis, bilateral: Secondary | ICD-10-CM | POA: Diagnosis not present

## 2023-04-12 DIAGNOSIS — H25813 Combined forms of age-related cataract, bilateral: Secondary | ICD-10-CM

## 2023-04-12 DIAGNOSIS — I10 Essential (primary) hypertension: Secondary | ICD-10-CM

## 2023-04-12 MED ORDER — FARICIMAB-SVOA 6 MG/0.05ML IZ SOLN
6.0000 mg | INTRAVITREAL | Status: AC | PRN
Start: 2023-04-12 — End: 2023-04-12
  Administered 2023-04-12: 6 mg via INTRAVITREAL

## 2023-04-12 NOTE — Progress Notes (Signed)
[facility] - Clinic Note  04/12/2023     CHIEF COMPLAINT Patient presents for Retina Follow Up   HISTORY OF PRESENT ILLNESS: [name] is a 52 y.o. female who presents to the clinic today for:  HPI     Retina Follow Up   Patient presents with  Wet AMD.  In right eye.  This started 3 weeks ago.  I, the attending physician,  performed the HPI with the patient and updated documentation appropriately.        Comments   Patient here for 3 weeks retina follow up for exu ARMD OD. Patient states vision is good. No eye pain. Not using drops.      Last edited by [name], MD on 04/12/2023  1:07 PM.    Patient feels that the vision is the same.   Referring physician: [name], PA-C [address] [place],  [place] [address]  HISTORICAL INFORMATION:   Selected notes from the MEDICAL RECORD NUMBER Referred by Dr. [name] for concern of sub-macular fluid / retinoschisis   CURRENT MEDICATIONS: No current outpatient medications on file. (Ophthalmic Drugs)   No current facility-administered medications for this visit. (Ophthalmic Drugs)   Current Outpatient Medications (Other)  Medication Sig   diphenhydrAMINE HCl (BENADRYL ALLERGY PO) Take by mouth.   estradiol (VIVELLE-DOT) 0.05 MG/24HR patch Place 1 patch (0.05 mg total) onto the skin 2 (two) times a week.   fexofenadine (ALLEGRA) 180 MG tablet Take 180 mg by mouth daily.   hydrochlorothiazide (HYDRODIURIL) 25 MG tablet TAKE 1 TABLET BY MOUTH ONCE DAILY IN THE MORNING FOR 90 DAYS   lisinopril (PRINIVIL,ZESTRIL) 20 MG tablet Take 1 tablet (20 mg total) by mouth daily.   progesterone (PROMETRIUM) 100 MG capsule Take 1 capsule (100 mg total) by mouth at bedtime.   eplerenone (INSPRA) 50 MG tablet Take 1 tablet (50 mg total) by mouth daily.   No current facility-administered medications for this visit. (Other)   REVIEW OF SYSTEMS: ROS   Positive for:  Cardiovascular, Eyes Negative for: Constitutional, Gastrointestinal, Neurological, Skin, Genitourinary, Musculoskeletal, HENT, Endocrine, Respiratory, Psychiatric, Allergic/Imm, Heme/Lymph Last edited by Laddie Aquas, COA on 04/12/2023 12:50 PM.      ALLERGIES Allergies  Allergen Reactions   Pollen Extract Itching   PAST MEDICAL HISTORY Past Medical History:  Diagnosis Date   Abnormal Pap smear of cervix    12-28-21 lgsil HPV HR +   Allergy    CIN I (cervical intraepithelial neoplasia I)    High risk HPV infection    Hypertension    Hypertensive retinopathy    OU   Missed ab AGUST 6 2014   Retina disorder 10/22/2019   pt states taking inspra for excess fluid in retina   Sciatic nerve injury    Past Surgical History:  Procedure Laterality Date   MYOMECTOMY  2008   FAMILY HISTORY Family History  Problem Relation Age of Onset   Hypertension Mother    Diabetes Father    Diabetes Maternal Grandmother    Hypertension Maternal Grandmother    Colon cancer Neg Hx    Colon polyps Neg Hx    Esophageal cancer Neg Hx    Rectal cancer Neg Hx    Stomach cancer Neg Hx    SOCIAL HISTORY Social History   Tobacco Use   Smoking status: Never   Smokeless tobacco: Never  Vaping Use   Vaping Use: Never used  Substance Use Topics  Alcohol use: No    Alcohol/week: 0.0 standard drinks of alcohol   Drug use: No       OPHTHALMIC EXAM: Base Eye Exam     Visual Acuity (Snellen - Linear)       Right Left   Dist cc 20/80 -2 20/20   Dist ph cc NI     Correction: Glasses         Tonometry (Tonopen, 12:48 PM)       Right Left   Pressure 15 12         Pupils       Dark Light Shape React APD   Right 3 2 Round Brisk None   Left 3 2 Round Brisk None         Visual Fields (Counting fingers)       Left Right    Full Full         Extraocular Movement       Right Left    Full, Ortho Full, Ortho         Neuro/Psych     Oriented x3: Yes    Mood/Affect: Normal         Dilation     Both eyes: 1.0% Mydriacyl, 2.5% Phenylephrine @ 12:48 PM           Slit Lamp and Fundus Exam     Slit Lamp Exam       Right Left   Lids/Lashes Dermatochalasis - upper lid Dermatochalasis - upper lid   Conjunctiva/Sclera Injection, nasal pterygium with +vessels 1+ Injection, nasal Pinguecula   Cornea Nasal ptergyium extending 2.1mm onto nasal cornea 1+Punctate epithelial erosions   Anterior Chamber Deep, clear, narrow temporal angle, No cell or flare deep, narrow temporal angle, 1+pigment   Iris Round and dilated Round and dilated   Lens 2+ Nuclear sclerosis, 2+ Cortical cataract 2+ Nuclear sclerosis, 2+ Cortical cataract   Anterior Vitreous Mild Vitreous syneresis, Posterior vitreous detachment, vitreous condensations Mild Vitreous syneresis         Fundus Exam       Right Left   Disc Pink and Sharp, +cupping Pink and Sharp, +cupping, shallow peripapillary SRF just nasal to disc -- stably resolved   C/D Ratio 0.65 0.6   Macula Flat, Blunted foveal reflex, persistent SRF -- slightly increased, RPE mottling, clumping and atrophy, No heme Flat, good foveal reflex, Epiretinal membrane, mild RPE mottling and clumping superiorly, No heme or edema   Vessels attenuated, Tortuous Vascular attenuation, mild Tortuousity   Periphery Attached, bullous Retinoschisis cavity from 0700-0900 -- no associated RT/RD - stable from prior, shallow schisis cavity ST, mild peripheral cystoid degeneration, pigmented cystoid degeneration inferiorly Attached, temporal retinoschisis cavity from 0300-0430 peripherally -- stable from prior, pigmented cystoid degeneration inferiorly           IMAGING AND PROCEDURES  Imaging and Procedures for @TODAY @  OCT, Retina - OU - Both Eyes       Right Eye Quality was good. Central Foveal Thickness: 241. Progression has worsened. Findings include normal foveal contour, no IRF, intraretinal hyper-reflective material,  subretinal fluid, outer retinal atrophy, vitreomacular adhesion (stable improvement in IRF; interval worsening of central SRF and foveal contour, + ORA, persistent bullous schisis IT periphery caught on widefield -- not imaged today).   Left Eye Quality was good. Central Foveal Thickness: 302. Progression has been stable. Findings include normal foveal contour, no IRF, no SRF, subretinal fluid, vitreomacular adhesion (Small pocket of SRF nasal disc caught on widefield --  stably resolved, +schisis IT quad not imaged today).   Notes *Images captured and stored on drive  Diagnosis / Impression:  OD: stable improvement in IRF; interval worsening of central SRF and foveal contour, + ORA, persistent bullous schisis IT periphery caught on widefield -- not imaged today OS: NFP, no IRF/SRF centrally, +VMA; Small pocket of SRF nasal disc caught on widefield -- stably resolved, +schisis IT quad not imaged today  Clinical management:  See below  Abbreviations: NFP - Normal foveal profile. CME - cystoid macular edema. PED - pigment epithelial detachment. IRF - intraretinal fluid. SRF - subretinal fluid. EZ - ellipsoid zone. ERM - epiretinal membrane. ORA - outer retinal atrophy. ORT - outer retinal tubulation. SRHM - subretinal hyper-reflective material      Intravitreal Injection, Pharmacologic Agent - OD - Right Eye       Time Out 04/12/2023. 1:07 PM. Confirmed correct patient, procedure, site, and patient consented.   Anesthesia Topical anesthesia was used. Anesthetic medications included Lidocaine 2%, Proparacaine 0.5%.   Procedure Preparation included 5% betadine to ocular surface, eyelid speculum. A (32g) needle was used.   Injection: 6 mg faricimab-svoa 6 MG/0.05ML   Route: Intravitreal, Site: Right Eye   NDC: O8010301, Lot: [id number], Expiration date: 04/09/2025, Waste: 0 mL   Post-op Post injection exam found visual acuity of at least counting fingers. The patient tolerated the  procedure well. There were no complications. The patient received written and verbal post procedure care education. Post injection medications were not given.            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 Essential hypertension  I10     5. Hypertensive retinopathy of both eyes  H35.033     6. Combined forms of age-related cataract of both eyes  H25.813      1,2. CSCR OD -- chronic  - pt reports decreased vision OD since December 2019  - reports significant stressors in life -- work  - denies steroid use - FA (09.30.20) shows focal hyperfluorescent leakage points nasal macula OD -- expansile dot phenotype? - started 50 mg po eplerenone since 09.30.20  -- pt self d/c in February, re-started in June 2021 - s/p IVA OD #1 (09.01.21), #2 (09.29.21), #3 (11.03.21), #4 (12.03.21) -- IVA resistance ============================================================ - s/p IVE OD #1 (01.20.22), #2 (02.18.22), #3 (04.05.22-sample), #4 (05.23.22), # 5 (06.27.22), #6 (07.28.22), #7 (08.26.22), #8 (09.23.22), #9 (10.21.22), #10 (11.18.22), #11 (12.16.22), #12 (01.20.23), #13 (03.02.23), #14 (04.07.23), #15 (05.12.23), #16 (06.09.23), #17 (07.14.23), #18 (08.08.23), #19 (09.08.23), #20 (10.06.23), #21 (11.06.23), #22 (12.04.23), #23 (01.08.24), #24 (02.12.24), #25 (03.18.24), #26 (04.22.24), #27 (05.20.24)  - BCVA OD 20/80- stable - OCT shows OD: stable improvement in IRF; interval worsening of foveal contour and SRF, +ORA, persistent bullous schisis caught IT periphery on widefield -- stable from prior OS: NFP, no IRF/SRF centrally,  +VMA; Small pocket of SRF nasal disc caught on widefield -- resolved, +schisis IT quad caught on widefield at 5 weeks  - differential includes atypical exudative ARMD  **discussed decreased efficacy / resistance to Eylea, inability to extend beyond 5  wks and potential benefit of switching medication**  - recommend switching to IVV OD #1 today, 07.02.24 with f/u in 4 weeks  - RBA of procedure discussed, questions answered  - IVV informed consent obtained and signed, 07.02.24  - see procedure note   - Eylea4U benefits investigation initiated 12.3.21 -- approved for 2023  - continue po eplerenone 50 mg daily  - f/u 4 weeks -- DFE/OCT, possible injection  3. Retinoschisis OU (OD > OS) -- stable  - bullous retinoschisis cavity IT quadrant OD-- 2952-8413 oclock  - bullous retinoschisis cavity IT quadrant OS -- 2440-1027 oclock  - no associated retinal holes/RT/RD on scleral depression   - widefield [employer] imaging (04.22.24) shows mild interval progression of schisis cavities' size and posterior extension in comparison to baseline images  - discussed findings and prognosis   - recommend non-urgent laser retinopexy of schisis cavities OU -- OS first-- Aug 2024  4,5. Hypertensive retinopathy OU  - discussed importance of tight BP control  - monitor  6. Mixed form age related cataracts OU - The symptoms of cataract, surgical options, and treatments and risks were discussed with patient.  - discussed diagnosis and progression  - monitor  Ophthalmic Meds Ordered this visit:  Meds ordered this encounter  Medications   faricimab-svoa (VABYSMO) 6mg /0.11mL intravitreal injection     Return in about 4 weeks (around 05/10/2023) for f/u exu ARMD OD, DFE, OCT.  There are no Patient Instructions on file for this visit.  This document serves as a record of services personally performed by Karie Chimera, MD, PhD. It was created on their behalf by Glee Arvin. Manson Passey, OA an ophthalmic technician. The  creation of this record is the provider's dictation and/or activities during the visit.    Electronically signed by: Glee Arvin. Manson Passey, OA 04/12/23 1:27 PM   Karie Chimera, M.D., Ph.D. Diseases & Surgery of the Retina and Vitreous [facility]  I have reviewed the above documentation for accuracy and completeness, and I agree with the above. Karie Chimera, M.D., Ph.D. 04/12/23 1:28 PM   Abbreviations: M myopia (nearsighted); A astigmatism; H hyperopia (farsighted); P presbyopia; Mrx spectacle prescription;  CTL contact lenses; OD right eye; OS left eye; OU both eyes  XT exotropia; ET esotropia; PEK punctate epithelial keratitis; PEE punctate epithelial erosions; DES dry eye syndrome; MGD meibomian gland dysfunction; ATs artificial tears; PFAT's preservative free artificial tears; NSC nuclear sclerotic cataract; PSC posterior subcapsular cataract; ERM epi-retinal membrane; PVD posterior vitreous detachment; RD retinal detachment; DM diabetes mellitus; DR diabetic retinopathy; NPDR non-proliferative diabetic retinopathy; PDR proliferative diabetic retinopathy; CSME clinically significant macular edema; DME diabetic macular edema; dbh dot blot hemorrhages; CWS cotton wool spot; POAG primary open angle glaucoma; C/D cup-to-disc ratio; HVF humphrey visual field; GVF goldmann visual field; OCT optical coherence tomography; IOP intraocular pressure; BRVO Branch retinal vein occlusion; CRVO central retinal vein occlusion; CRAO central retinal artery occlusion; BRAO branch retinal artery occlusion; RT retinal tear; SB scleral buckle; PPV pars plana vitrectomy; VH Vitreous hemorrhage; PRP panretinal laser photocoagulation; IVK intravitreal kenalog; VMT vitreomacular traction; MH Macular hole;  NVD neovascularization of the disc; NVE neovascularization elsewhere; AREDS age related eye disease study; ARMD age related macular degeneration; POAG primary open angle glaucoma; EBMD  epithelial/anterior basement membrane dystrophy; ACIOL anterior chamber intraocular lens; IOL intraocular lens; PCIOL posterior chamber intraocular lens; Phaco/IOL phacoemulsification with intraocular lens placement; PRK photorefractive keratectomy; LASIK laser assisted in situ keratomileusis; HTN hypertension; DM diabetes mellitus; COPD  chronic obstructive pulmonary disease

## 2023-04-20 NOTE — Progress Notes (Deleted)
GYNECOLOGY  VISIT   HPI: 52 y.o.   Single  Hispanic  female   [id number] with Patient's last menstrual period was 02/18/2022.   here for   colpo  GYNECOLOGIC HISTORY: Patient's last menstrual period was 02/18/2022. Contraception:  condoms/PMP Menopausal hormone therapy:  estradiol patch Last mammogram:  02/02/23 Breast Density Cat B, BI-RADS CAT 1 neg Last pap smear:   03/11/23 LSIL: HR HPV positive, 12/28/21 LSIL: HR HPV positive        OB History     Gravida  1   Para  0   Term      Preterm      AB  1   Living  0      SAB  1   IAB      Ectopic      Multiple      Live Births                 Patient Active Problem List   Diagnosis Date Noted   Elevated platelet count (HCC) 07/31/2015   Elevated LFTs 07/31/2015   Menstrual migraine without status migrainosus, not intractable 06/27/2015   CIN I (cervical intraepithelial neoplasia I) 06/16/2012   History of sciatica 06/16/2012   Depression 06/16/2012   Anxiety 06/16/2012   Hypertension 06/16/2011   Fibroid uterus 06/16/2011    Past Medical History:  Diagnosis Date   Abnormal Pap smear of cervix    12-28-21 lgsil HPV HR +   Allergy    CIN I (cervical intraepithelial neoplasia I)    High risk HPV infection    Hypertension    Hypertensive retinopathy    OU   Missed ab AGUST 6 2014   Retina disorder 10/22/2019   pt states taking inspra for excess fluid in retina   Sciatic nerve injury     Past Surgical History:  Procedure Laterality Date   MYOMECTOMY  2008    Current Outpatient Medications  Medication Sig Dispense Refill   diphenhydrAMINE HCl (BENADRYL ALLERGY PO) Take by mouth.     eplerenone (INSPRA) 50 MG tablet Take 1 tablet (50 mg total) by mouth daily. 30 tablet 10   estradiol (VIVELLE-DOT) 0.05 MG/24HR patch Place 1 patch (0.05 mg total) onto the skin 2 (two) times a week. 24 patch 4   fexofenadine (ALLEGRA) 180 MG tablet Take 180 mg by mouth daily.     hydrochlorothiazide (HYDRODIURIL) 25  MG tablet TAKE 1 TABLET BY MOUTH ONCE DAILY IN THE MORNING FOR 90 DAYS     lisinopril (PRINIVIL,ZESTRIL) 20 MG tablet Take 1 tablet (20 mg total) by mouth daily. 30 tablet 12   progesterone (PROMETRIUM) 100 MG capsule Take 1 capsule (100 mg total) by mouth at bedtime. 90 capsule 4   No current facility-administered medications for this visit.     ALLERGIES: Pollen extract  Family History  Problem Relation Age of Onset   Hypertension Mother    Diabetes Father    Diabetes Maternal Grandmother    Hypertension Maternal Grandmother    Colon cancer Neg Hx    Colon polyps Neg Hx    Esophageal cancer Neg Hx    Rectal cancer Neg Hx    Stomach cancer Neg Hx     Social History   Socioeconomic History   Marital status: Single    Spouse name: Not on file   Number of children: Not on file   Years of education: Not on file   Highest education level: Not on file  Occupational History   Not on file  Tobacco Use   Smoking status: Never   Smokeless tobacco: Never  Vaping Use   Vaping Use: Never used  Substance and Sexual Activity   Alcohol use: No    Alcohol/week: 0.0 standard drinks of alcohol   Drug use: No   Sexual activity: Yes    Partners: Male    Birth control/protection: Condom  Other Topics Concern   Not on file  Social History Narrative   Not on file   Social Determinants of Health   Financial Resource Strain: Not on file  Food Insecurity: Not on file  Transportation Needs: Not on file  Physical Activity: Not on file  Stress: Not on file  Social Connections: Not on file  Intimate Partner Violence: Not on file    Review of Systems  PHYSICAL EXAMINATION:    LMP 02/18/2022 Comment: sexually active, condoms    General appearance: alert, cooperative and appears stated age Head: Normocephalic, without obvious abnormality, atraumatic Neck: no adenopathy, supple, symmetrical, trachea midline and thyroid normal to inspection and palpation Lungs: clear to auscultation  bilaterally Breasts: normal appearance, no masses or tenderness, No nipple retraction or dimpling, No nipple discharge or bleeding, No axillary or supraclavicular adenopathy Heart: regular rate and rhythm Abdomen: soft, non-tender, no masses,  no organomegaly Extremities: extremities normal, atraumatic, no cyanosis or edema Skin: Skin color, texture, turgor normal. No rashes or lesions Lymph nodes: Cervical, supraclavicular, and axillary nodes normal. No abnormal inguinal nodes palpated Neurologic: Grossly normal  Pelvic: External genitalia:  no lesions              Urethra:  normal appearing urethra with no masses, tenderness or lesions              Bartholins and Skenes: normal                 Vagina: normal appearing vagina with normal color and discharge, no lesions              Cervix: no lesions                Bimanual Exam:  Uterus:  normal size, contour, position, consistency, mobility, non-tender              Adnexa: no mass, fullness, tenderness              Rectal exam: {yes no:[id number]}.  Confirms.              Anus:  normal sphincter tone, no lesions  Chaperone was present for exam:  ***  ASSESSMENT     PLAN     An After Visit Summary was printed and given to the patient.  ______ minutes face to face time of which over 50% was spent in counseling.

## 2023-04-26 NOTE — Telephone Encounter (Signed)
No return call from pt. Will close encounter.

## 2023-04-27 ENCOUNTER — Encounter: Payer: Commercial Managed Care - PPO | Admitting: Obstetrics and Gynecology

## 2023-04-28 NOTE — Progress Notes (Signed)
[facility] - Clinic Note  05/10/2023     CHIEF COMPLAINT Patient presents for Retina Follow Up   HISTORY OF PRESENT ILLNESS: [name] is a 52 y.o. female who presents to the clinic today for:  HPI     Retina Follow Up   Patient presents with  Wet AMD.  In right eye.  This started 4 weeks ago.  Duration of 4 weeks.  Since onset it is stable.  I, the attending physician,  performed the HPI with the patient and updated documentation appropriately.        Comments   4 week retina follow up AMD And IVV OD pt is reporting no vision changes noticed she denies any flashes or floaters       Last edited by [name], MD on 05/10/2023  3:39 PM.    Patient feels that the vision is the same and had no issues after the last shot.   Referring physician: [name], PA-C [address] [place],  [place] [address]  HISTORICAL INFORMATION:   Selected notes from the MEDICAL RECORD NUMBER Referred by Dr. [name] for concern of sub-macular fluid / retinoschisis   CURRENT MEDICATIONS: No current outpatient medications on file. (Ophthalmic Drugs)   No current facility-administered medications for this visit. (Ophthalmic Drugs)   Current Outpatient Medications (Other)  Medication Sig   diphenhydrAMINE HCl (BENADRYL ALLERGY PO) Take by mouth.   eplerenone (INSPRA) 50 MG tablet Take 1 tablet (50 mg total) by mouth daily.   estradiol (VIVELLE-DOT) 0.05 MG/24HR patch Place 1 patch (0.05 mg total) onto the skin 2 (two) times a week.   fexofenadine (ALLEGRA) 180 MG tablet Take 180 mg by mouth daily.   hydrochlorothiazide (HYDRODIURIL) 25 MG tablet TAKE 1 TABLET BY MOUTH ONCE DAILY IN THE MORNING FOR 90 DAYS   lisinopril (PRINIVIL,ZESTRIL) 20 MG tablet Take 1 tablet (20 mg total) by mouth daily.   progesterone (PROMETRIUM) 100 MG capsule Take 1 capsule (100 mg total) by mouth at bedtime.   No current facility-administered  medications for this visit. (Other)   REVIEW OF SYSTEMS: ROS   Positive for: Cardiovascular, Eyes Negative for: Constitutional, Gastrointestinal, Neurological, Skin, Genitourinary, Musculoskeletal, HENT, Endocrine, Respiratory, Psychiatric, Allergic/Imm, Heme/Lymph Last edited by Etheleen Mayhew, COT on 05/10/2023  1:11 PM.       ALLERGIES Allergies  Allergen Reactions   Pollen Extract Itching   PAST MEDICAL HISTORY Past Medical History:  Diagnosis Date   Abnormal Pap smear of cervix    12-28-21 lgsil HPV HR +   Allergy    CIN I (cervical intraepithelial neoplasia I)    High risk HPV infection    Hypertension    Hypertensive retinopathy    OU   Missed ab AGUST 6 2014   Retina disorder 10/22/2019   pt states taking inspra for excess fluid in retina   Sciatic nerve injury    Past Surgical History:  Procedure Laterality Date   MYOMECTOMY  2008   FAMILY HISTORY Family History  Problem Relation Age of Onset   Hypertension Mother    Diabetes Father    Diabetes Maternal Grandmother    Hypertension Maternal Grandmother    Colon cancer Neg Hx    Colon polyps Neg Hx    Esophageal cancer Neg Hx    Rectal cancer Neg Hx    Stomach cancer Neg Hx    SOCIAL HISTORY Social History   Tobacco Use  Smoking status: Never   Smokeless tobacco: Never  Vaping Use   Vaping status: Never Used  Substance Use Topics   Alcohol use: No    Alcohol/week: 0.0 standard drinks of alcohol   Drug use: No       OPHTHALMIC EXAM: Base Eye Exam     Visual Acuity (Snellen - Linear)       Right Left   Dist cc 20/80 -2 20/20 -2   Dist ph cc NI     Correction: Glasses         Tonometry (Tonopen, 1:15 PM)       Right Left   Pressure 16 14         Pupils       Pupils Dark Light Shape React APD   Right PERRL 4 2 Round Brisk None   Left PERRL 4 2 Round Brisk None         Visual Fields       Left Right    Full Full         Extraocular Movement       Right  Left    Full, Ortho Full, Ortho         Neuro/Psych     Oriented x3: Yes   Mood/Affect: Normal         Dilation     Both eyes: 2.5% Phenylephrine @ 1:15 PM           Slit Lamp and Fundus Exam     Slit Lamp Exam       Right Left   Lids/Lashes Dermatochalasis - upper lid Dermatochalasis - upper lid   Conjunctiva/Sclera Injection, nasal pterygium with +vessels 1+ Injection, nasal Pinguecula   Cornea Nasal ptergyium extending 2.32mm onto nasal cornea 1+Punctate epithelial erosions   Anterior Chamber Deep, clear, narrow temporal angle, No cell or flare deep, narrow temporal angle, 1+pigment   Iris Round and dilated Round and dilated   Lens 2+ Nuclear sclerosis, 2+ Cortical cataract 2+ Nuclear sclerosis, 2+ Cortical cataract   Anterior Vitreous Mild Vitreous syneresis, Posterior vitreous detachment, vitreous condensations Mild Vitreous syneresis         Fundus Exam       Right Left   Disc Pink and Sharp, +cupping Pink and Sharp, +cupping, shallow peripapillary SRF just nasal to disc   C/D Ratio 0.65 0.6   Macula Flat, Blunted foveal reflex, persistent SRF -- slightly improved, RPE mottling, clumping and atrophy, No heme Flat, good foveal reflex, Epiretinal membrane, mild RPE mottling and clumping superiorly, No heme or edema   Vessels attenuated, Tortuous Vascular attenuation, mild Tortuousity   Periphery Attached, bullous Retinoschisis cavity from 0700-0900 -- no associated RT/RD - stable from prior, shallow schisis cavity ST, mild peripheral cystoid degeneration, pigmented cystoid degeneration inferiorly Attached, temporal retinoschisis cavity from 0300-0430 peripherally -- stable from prior, pigmented cystoid degeneration inferiorly           Refraction     Wearing Rx       Sphere Cylinder Axis   Right +1.25 +1.25 112   Left +2.25 +1.00 101           IMAGING AND PROCEDURES  Imaging and Procedures for @TODAY @  OCT, Retina - OU - Both Eyes       Right  Eye Quality was good. Central Foveal Thickness: 212. Progression has improved. Findings include normal foveal contour, no IRF, intraretinal hyper-reflective material, subretinal fluid, outer retinal atrophy, vitreomacular adhesion (stable improvement in IRF; interval  improvement in central SRF and foveal contour, + ORA, persistent bullous schisis IT periphery caught on widefield -- not imaged today).   Left Eye Quality was good. Central Foveal Thickness: 299. Progression has been stable. Findings include normal foveal contour, no IRF, no SRF, subretinal fluid, vitreomacular adhesion (Small pocket of SRF nasal disc caught on widefield, +schisis IT quad not imaged today).   Notes *Images captured and stored on drive  Diagnosis / Impression:  OD: stable improvement in IRF; interval improvement in central SRF and foveal contour, +ORA, persistent bullous schisis IT periphery caught on widefield -- not imaged today OS: NFP,Small pocket of SRF nasal disc caught on widefield, +schisis IT quad not imaged today  Clinical management:  See below  Abbreviations: NFP - Normal foveal profile. CME - cystoid macular edema. PED - pigment epithelial detachment. IRF - intraretinal fluid. SRF - subretinal fluid. EZ - ellipsoid zone. ERM - epiretinal membrane. ORA - outer retinal atrophy. ORT - outer retinal tubulation. SRHM - subretinal hyper-reflective material      Intravitreal Injection, Pharmacologic Agent - OD - Right Eye       Time Out 05/10/2023. 1:37 PM. Confirmed correct patient, procedure, site, and patient consented.   Anesthesia Topical anesthesia was used. Anesthetic medications included Lidocaine 2%, Proparacaine 0.5%.   Procedure Preparation included 5% betadine to ocular surface, eyelid speculum. A (32g) needle was used.   Injection: 6 mg faricimab-svoa 6 MG/0.05ML   Route: Intravitreal, Site: Right Eye   NDC: O8010301, Lot: [id number], Expiration date: 06/10/2025, Waste: 0 mL    Post-op Post injection exam found visual acuity of at least counting fingers. The patient tolerated the procedure well. There were no complications. The patient received written and verbal post procedure care education. Post injection medications were not given.            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 Essential hypertension  I10     5. Hypertensive retinopathy of both eyes  H35.033     6. Combined forms of age-related cataract of both eyes  H25.813      1,2. CSCR OD -- chronic  - pt reports decreased vision OD since December 2019  - reports significant stressors in life -- work  - denies steroid use - FA (09.30.20) shows focal hyperfluorescent leakage points nasal macula OD -- expansile dot phenotype? - started 50 mg po eplerenone since 09.30.20  -- pt self d/c in February, re-started in June 2021 - s/p IVA OD #1 (09.01.21), #2 (09.29.21), #3 (11.03.21), #4 (12.03.21) -- IVA resistance - s/p IVE OD #1 (01.20.22), #2 (02.18.22), #3 (04.05.22-sample), #4 (05.23.22), # 5 (06.27.22), #6 (07.28.22), #7 (08.26.22), #8 (09.23.22), #9 (10.21.22), #10 (11.18.22), #11 (12.16.22), #12 (01.20.23), #13 (03.02.23), #14 (04.07.23), #15 (05.12.23), #16 (06.09.23), #17 (07.14.23), #18 (08.08.23), #19 (09.08.23), #20 (10.06.23), #21 (11.06.23), #22 (12.04.23), #23 (01.08.24), #24 (02.12.24), #25 (03.18.24), #26 (04.22.24), #27 (05.20.24) -- IVE  resistance ============================================================ - s/p IVV OD #1 (07.02.24)  - BCVA OD 20/80- stable - OCT  shows OD: stable improvement in IRF; interval improvement in central SRF and foveal contour, + ORA, persistent bullous schisis IT periphery caught on widefield -- not imaged today at 4 weeks  - differential includes atypical exudative ARMD  - recommend IVV OD #2 today, 07.30.24 with f/u in 4 weeks  - RBA of procedure discussed, questions answered  - IVV informed consent obtained and signed, 07.02.24  - see procedure note   - [employer] benefits investigation initiated 12.3.21 -- approved for 2023  - continue po eplerenone 50 mg daily  - f/u 4 weeks -- DFE/OCT, possible injection  3. Retinoschisis OU (OD > OS) -- stable  - bullous retinoschisis cavity IT quadrant OD-- 2130-8657 oclock  - bullous retinoschisis cavity IT quadrant OS -- 8469-6295 oclock  - no associated retinal holes/RT/RD on scleral depression   - widefield Opt[employer] imaging (04.22.24) shows mild interval progression of schisis cavities' size and posterior extension in comparison to baseline images  - discussed findings and prognosis  - recommend non-urgent laser retinopexy of schisis cavities OU -- OS first - f/u 05/23/23 for laser retinopexy OS  4,5. Hypertensive retinopathy OU  - discussed importance of tight BP control  - monitor  6. Mixed form age related cataracts OU - The symptoms of cataract, surgical options, and treatments and risks were discussed with patient.  - discussed diagnosis and progression  - monitor  Ophthalmic Meds Ordered this visit:  Meds ordered this encounter  Medications   faricimab-svoa (VABYSMO) 6mg /0.26mL intravitreal injection     Return in about 4 weeks (around 06/07/2023) for F/u CSCR OD, DFE, OCT, Possible, IVE, OD, Mon 05/23/23 @ 9:45A Laser retinopexy OS.  There are no Patient Instructions on file for this visit.  This document serves as a record of services  personally performed by Karie Chimera, MD, PhD. It was created on their behalf by Gerilyn Nestle, COT an ophthalmic technician. The creation of this record is the provider's dictation and/or activities during the visit.    Electronically signed by:  Charlette Caffey, COT  05/10/23 3:40 PM  Karie Chimera, M.D., Ph.D. Diseases & Surgery of the Retina and Vitreous [facility]  I have reviewed the above documentation for accuracy and completeness, and I agree with the above. Karie Chimera, M.D., Ph.D. 05/10/23 3:42 PM   Abbreviations: M myopia (nearsighted); A astigmatism; H hyperopia (farsighted); P presbyopia; Mrx spectacle prescription;  CTL contact lenses; OD right eye; OS left eye; OU both eyes  XT exotropia; ET esotropia; PEK punctate epithelial keratitis; PEE punctate epithelial erosions; DES dry eye syndrome; MGD meibomian gland dysfunction; ATs artificial tears; PFAT's preservative free artificial tears; NSC nuclear sclerotic cataract; PSC posterior subcapsular cataract; ERM epi-retinal membrane; PVD posterior vitreous detachment; RD retinal detachment; DM diabetes mellitus; DR diabetic retinopathy; NPDR non-proliferative diabetic retinopathy; PDR proliferative diabetic retinopathy; CSME clinically significant macular edema; DME diabetic macular edema; dbh dot blot hemorrhages; CWS cotton wool spot; POAG primary open angle glaucoma; C/D cup-to-disc ratio; HVF humphrey visual field; GVF goldmann visual field; OCT optical coherence tomography; IOP intraocular pressure; BRVO Branch retinal vein occlusion; CRVO central retinal vein occlusion; CRAO central retinal artery occlusion; BRAO branch retinal artery occlusion; RT retinal tear; SB scleral buckle; PPV pars plana vitrectomy; VH Vitreous hemorrhage; PRP panretinal laser photocoagulation; IVK intravitreal kenalog; VMT vitreomacular traction; MH Macular hole;  NVD neovascularization of the disc; NVE  neovascularization elsewhere; AREDS age related eye disease study; ARMD age related macular degeneration; POAG primary  open angle glaucoma; EBMD epithelial/anterior basement membrane dystrophy; ACIOL anterior chamber intraocular lens; IOL intraocular lens; PCIOL posterior chamber intraocular lens; Phaco/IOL phacoemulsification with intraocular lens placement; PRK photorefractive keratectomy; LASIK laser assisted in situ keratomileusis; HTN hypertension; DM diabetes mellitus; COPD chronic obstructive pulmonary disease

## 2023-05-04 DIAGNOSIS — O021 Missed abortion: Secondary | ICD-10-CM | POA: Insufficient documentation

## 2023-05-09 NOTE — Progress Notes (Signed)
GYNECOLOGY  VISIT   HPI: 52 y.o.   Single  Hispanic  female   [id number] with Patient's last menstrual period was 02/18/2022.   here for   colposcopy for pap LGSIL and positive HR HPV.  Spanish interpretor is present today.  Had colposcopy 02/02/22 showing LGSIL, done due to pap 12/28/21 showing LGSIL and positive HR HPV.  Takes HRT.  GYNECOLOGIC HISTORY: Patient's last menstrual period was 02/18/2022. Contraception:  PMP Menopausal hormone therapy:  estradiol Last mammogram:  02/02/23 Breast Density Cat B, BI-RADS CAT 1 neg Last pap smear:   03/11/23 LSIL: HR HPV positive, 12/28/21 LSIL: HR HPV positive        OB History     Gravida  1   Para  0   Term      Preterm      AB  1   Living  0      SAB  1   IAB      Ectopic      Multiple      Live Births                 Patient Active Problem List   Diagnosis Date Noted   Elevated platelet count (HCC) 07/31/2015   Elevated LFTs 07/31/2015   Menstrual migraine without status migrainosus, not intractable 06/27/2015   CIN I (cervical intraepithelial neoplasia I) 06/16/2012   History of sciatica 06/16/2012   Depression 06/16/2012   Anxiety 06/16/2012   Hypertension 06/16/2011   Fibroid uterus 06/16/2011    Past Medical History:  Diagnosis Date   Abnormal Pap smear of cervix    12-28-21 lgsil HPV HR +   Allergy    CIN I (cervical intraepithelial neoplasia I)    High risk HPV infection    Hypertension    Hypertensive retinopathy    OU   Missed ab AGUST 6 2014   Retina disorder 10/22/2019   pt states taking inspra for excess fluid in retina   Sciatic nerve injury     Past Surgical History:  Procedure Laterality Date   MYOMECTOMY  2008    Current Outpatient Medications  Medication Sig Dispense Refill   diphenhydrAMINE HCl (BENADRYL ALLERGY PO) Take by mouth.     estradiol (VIVELLE-DOT) 0.05 MG/24HR patch Place 1 patch (0.05 mg total) onto the skin 2 (two) times a week. 24 patch 4   fexofenadine  (ALLEGRA) 180 MG tablet Take 180 mg by mouth daily.     hydrochlorothiazide (HYDRODIURIL) 25 MG tablet TAKE 1 TABLET BY MOUTH ONCE DAILY IN THE MORNING FOR 90 DAYS     lisinopril (PRINIVIL,ZESTRIL) 20 MG tablet Take 1 tablet (20 mg total) by mouth daily. 30 tablet 12   progesterone (PROMETRIUM) 100 MG capsule Take 1 capsule (100 mg total) by mouth at bedtime. 90 capsule 4   eplerenone (INSPRA) 50 MG tablet Take 1 tablet (50 mg total) by mouth daily. 30 tablet 10   No current facility-administered medications for this visit.     ALLERGIES: Pollen extract  Family History  Problem Relation Age of Onset   Hypertension Mother    Diabetes Father    Diabetes Maternal Grandmother    Hypertension Maternal Grandmother    Colon cancer Neg Hx    Colon polyps Neg Hx    Esophageal cancer Neg Hx    Rectal cancer Neg Hx    Stomach cancer Neg Hx     Social History   Socioeconomic History   Marital status: Single  Spouse name: Not on file   Number of children: Not on file   Years of education: Not on file   Highest education level: Not on file  Occupational History   Not on file  Tobacco Use   Smoking status: Never   Smokeless tobacco: Never  Vaping Use   Vaping status: Never Used  Substance and Sexual Activity   Alcohol use: No    Alcohol/week: 0.0 standard drinks of alcohol   Drug use: No   Sexual activity: Yes    Partners: Male    Birth control/protection: Condom  Other Topics Concern   Not on file  Social History Narrative   Not on file   Social Determinants of Health   Financial Resource Strain: Not on file  Food Insecurity: Not on file  Transportation Needs: Not on file  Physical Activity: Not on file  Stress: Not on file  Social Connections: Unknown (02/23/2022)   Received from [facility]   Social Network    Social Network: Not on file  Intimate Partner Violence: Unknown (01/15/2022)   Received from [facility]   HITS    Physically Hurt: Not on file     Insult or Talk Down To: Not on file    Threaten Physical Harm: Not on file    Scream or Curse: Not on file    Review of Systems  All other systems reviewed and are negative.   PHYSICAL EXAMINATION:    BP 110/76 (BP Location: Right Arm, Patient Position: Sitting, Cuff Size: Normal)   Pulse 71   Ht 5' 3.75" (1.619 m)   Wt 163 lb (73.9 kg)   LMP 02/18/2022 Comment: sexually active, condoms  SpO2 100%   BMI 28.20 kg/m     General appearance: alert, cooperative and appears stated age  Colposcopy - cervix, vagina, and vulva.  Consent for procedure.  3% acetic acid used in vagina and on vulva. White light and green light filter used.  Colposcopy satisfactory:  Yes   __x___          No    _____ Findings:    Cervix:  small os. No lesions.  Vagina:  No lesions noted with placement of acetic acid.  Decreased Lugol's uptake right vaginal apex. Vulva:  no lesions.  Biopsies:   ECC, right vaginal apex. Minimal EBL. No complications.   Chaperone was present for exam:  Warren Lacy, CMA  ASSESSMENT  LGSIL pap, positive HR HPV.  Hx LGSIL on prior colposcopy in 2023.    PLAN  FU biopsies.  Precautions given.  Annual exam with pap and HR HPV in May, 2025.

## 2023-05-10 ENCOUNTER — Ambulatory Visit (INDEPENDENT_AMBULATORY_CARE_PROVIDER_SITE_OTHER): Payer: Commercial Managed Care - PPO | Admitting: Ophthalmology

## 2023-05-10 ENCOUNTER — Encounter (INDEPENDENT_AMBULATORY_CARE_PROVIDER_SITE_OTHER): Payer: Self-pay | Admitting: Ophthalmology

## 2023-05-10 DIAGNOSIS — H35711 Central serous chorioretinopathy, right eye: Secondary | ICD-10-CM | POA: Diagnosis not present

## 2023-05-10 DIAGNOSIS — H35033 Hypertensive retinopathy, bilateral: Secondary | ICD-10-CM

## 2023-05-10 DIAGNOSIS — H33103 Unspecified retinoschisis, bilateral: Secondary | ICD-10-CM

## 2023-05-10 DIAGNOSIS — H353211 Exudative age-related macular degeneration, right eye, with active choroidal neovascularization: Secondary | ICD-10-CM | POA: Diagnosis not present

## 2023-05-10 DIAGNOSIS — I10 Essential (primary) hypertension: Secondary | ICD-10-CM | POA: Diagnosis not present

## 2023-05-10 DIAGNOSIS — H25813 Combined forms of age-related cataract, bilateral: Secondary | ICD-10-CM

## 2023-05-10 MED ORDER — FARICIMAB-SVOA 6 MG/0.05ML IZ SOLN
6.0000 mg | INTRAVITREAL | Status: AC | PRN
Start: 2023-05-10 — End: 2023-05-10
  Administered 2023-05-10: 6 mg via INTRAVITREAL

## 2023-05-13 ENCOUNTER — Other Ambulatory Visit (HOSPITAL_COMMUNITY)
Admission: RE | Admit: 2023-05-13 | Discharge: 2023-05-13 | Disposition: A | Discharge status: Home / Self Care | Payer: Commercial Managed Care - PPO | Source: Ambulatory Visit | Attending: Obstetrics and Gynecology | Admitting: Obstetrics and Gynecology

## 2023-05-13 ENCOUNTER — Encounter: Payer: Self-pay | Admitting: Obstetrics and Gynecology

## 2023-05-13 ENCOUNTER — Ambulatory Visit (INDEPENDENT_AMBULATORY_CARE_PROVIDER_SITE_OTHER): Payer: Commercial Managed Care - PPO | Admitting: Obstetrics and Gynecology

## 2023-05-13 VITALS — BP 110/76 | HR 71 | Ht 63.75 in | Wt 163.0 lb

## 2023-05-13 DIAGNOSIS — R87612 Low grade squamous intraepithelial lesion on cytologic smear of cervix (LGSIL): Secondary | ICD-10-CM | POA: Diagnosis not present

## 2023-05-13 DIAGNOSIS — R87810 Cervical high risk human papillomavirus (HPV) DNA test positive: Secondary | ICD-10-CM | POA: Diagnosis present

## 2023-05-13 NOTE — Patient Instructions (Signed)
Colposcopa, cuidados posteriores Colposcopy, Care After  La siguiente informacin ofrece orientacin sobre cmo cuidarse despus del procedimiento. El mdico tambin podr darle instrucciones ms especficas. Comunquese con el mdico si tiene problemas o preguntas. Qu puedo esperar despus del procedimiento? Si le realizaron una colposcopa sin biopsia, puede esperar sentirse bien inmediatamente despus del procedimiento. Sin embargo, es posible que tenga algo de [place] de sangre durante [place]. Puede retomar sus [employer]. Si se le realiz una colposcopa con biopsia, despus del procedimiento es frecuente que presente lo siguiente: Sensibilidad y dolor leve. Esto puede durar [employer]. Sangrado leve de la vagina o secrecin de color oscuro y Librarian, academic. Esto puede durar [employer]. La secrecin puede deberse a un lquido (solucin) que se [place] durante el procedimiento. Durante este tiempo deber usar un apsito sanitario. Manchas de [employer] al menos 48 horas despus del procedimiento. Siga estas instrucciones en su casa: Medicamentos Use los medicamentos de venta libre y los recetados solamente como se lo haya indicado el mdico. Hable con el mdico acerca de qu tipo de analgsico de venta libre y recetado puede volver a Careers adviser. Es especialmente importante que hable con el mdico si toma anticoagulantes. Actividad Evite usar productos de ducha vaginal, usar tampones y Warehouse manager sexo durante al menos 3 das despus del procedimiento o durante el tiempo que le haya indicado el mdico. Retome sus actividades normales como se lo haya indicado el mdico. Pregntele al mdico qu actividades son seguras para usted. Instrucciones generales Pregunte al mdico si puede tomar baos de inmersin, nadar o usar el jacuzzi. Puede ducharse. Si [place] un mtodo anticonceptivo (anticoncepcin), contine utilizndolo. Concurra a todas las visitas de seguimiento. Esto es  importante. Comunquese con un mdico si: Tiene fiebre o escalofros. Se desmaya o tiene sensacin de desvanecimiento. Solicite ayuda de inmediato si: Tiene una hemorragia abundante por la vagina o despide cogulos de sangre. Sangrado abundante es el sangrado que empapa una toalla higinica en menos de 1 hora. Tiene secrecin vaginal que es anormal, tiene color amarillo o tiene mal olor. Puede ser un signo de infeccin. Tiene dolor intenso o clicos en la parte baja del abdomen que no se van con medicamentos. Resumen Si se le realiz una colposcopa sin biopsia, puede esperar sentirse bien de inmediato, pero es posible que presente manchas de sangre por [address]. Puede retomar sus [employer]. Si se le realiz una colposcopa con biopsia, es comn tener un dolor leve durante [address] y [place] de sangre durante 48 horas despus del procedimiento. Evite usar productos de ducha vaginal, usar tampones y Warehouse manager sexo durante al menos 3 das despus del procedimiento o durante el tiempo que le haya indicado el mdico. [place] ayuda de inmediato si tiene sangrado profuso, dolor intenso o signos de infeccin. Esta informacin no tiene Theme park manager el consejo del mdico. Asegrese de hacerle al mdico cualquier pregunta que tenga. Document Revised: 03/17/2021 Document Reviewed: 03/17/2021 Elsevier Patient Education  2024 [employer].

## 2023-05-19 NOTE — Progress Notes (Signed)
[facility] - Clinic Note  05/23/2023     CHIEF COMPLAINT Patient presents for Retina Follow Up   HISTORY OF PRESENT ILLNESS: [name] is a 52 y.o. female who presents to the clinic today for:  HPI     Retina Follow Up   In right eye.  This started 4 weeks ago.  Duration of 4 weeks.  Since onset it is stable.  I, the attending physician,  performed the HPI with the patient and updated documentation appropriately.        Comments   4 week retina follow up and I'VE OD pt is reporting she denies any flashes or floaters       Last edited by [name], MD on 05/23/2023 10:36 AM.     Here for laser retinopexy OS for retinoschisis  Referring physician: [name], PA-C [address] [place],  [place] [address]  HISTORICAL INFORMATION:   Selected notes from the MEDICAL RECORD NUMBER Referred by Dr. [name] for concern of sub-macular fluid / retinoschisis   CURRENT MEDICATIONS: Current Outpatient Medications (Ophthalmic Drugs)  Medication Sig   prednisoLONE acetate (PRED FORTE) 1 % ophthalmic suspension Place 1 drop into the left eye 4 (four) times daily for 7 days.   No current facility-administered medications for this visit. (Ophthalmic Drugs)   Current Outpatient Medications (Other)  Medication Sig   diphenhydrAMINE HCl (BENADRYL ALLERGY PO) Take by mouth.   eplerenone (INSPRA) 50 MG tablet Take 1 tablet (50 mg total) by mouth daily.   estradiol (VIVELLE-DOT) 0.05 MG/24HR patch Place 1 patch (0.05 mg total) onto the skin 2 (two) times a week.   fexofenadine (ALLEGRA) 180 MG tablet Take 180 mg by mouth daily.   hydrochlorothiazide (HYDRODIURIL) 25 MG tablet TAKE 1 TABLET BY MOUTH ONCE DAILY IN THE MORNING FOR 90 DAYS   lisinopril (PRINIVIL,ZESTRIL) 20 MG tablet Take 1 tablet (20 mg total) by mouth daily.   progesterone (PROMETRIUM) 100 MG capsule Take 1 capsule (100 mg total) by mouth at bedtime.   No  current facility-administered medications for this visit. (Other)   REVIEW OF SYSTEMS: ROS   Positive for: Cardiovascular, Eyes Negative for: Constitutional, Gastrointestinal, Neurological, Skin, Genitourinary, Musculoskeletal, HENT, Endocrine, Respiratory, Psychiatric, Allergic/Imm, Heme/Lymph Last edited by Etheleen Mayhew, COT on 05/23/2023  9:24 AM.        ALLERGIES Allergies  Allergen Reactions   Pollen Extract Itching   PAST MEDICAL HISTORY Past Medical History:  Diagnosis Date   Abnormal Pap smear of cervix    12-28-21 lgsil HPV HR +   Allergy    CIN I (cervical intraepithelial neoplasia I)    High risk HPV infection    Hypertension    Hypertensive retinopathy    OU   Missed ab AGUST 6 2014   Retina disorder 10/22/2019   pt states taking inspra for excess fluid in retina   Sciatic nerve injury    Past Surgical History:  Procedure Laterality Date   MYOMECTOMY  2008   FAMILY HISTORY Family History  Problem Relation Age of Onset   Hypertension Mother    Diabetes Father    Diabetes Maternal Grandmother    Hypertension Maternal Grandmother    Colon cancer Neg Hx    Colon polyps Neg Hx    Esophageal cancer Neg Hx    Rectal cancer Neg Hx    Stomach cancer Neg Hx    SOCIAL HISTORY Social History   Tobacco  Use   Smoking status: Never   Smokeless tobacco: Never  Vaping Use   Vaping status: Never Used  Substance Use Topics   Alcohol use: No    Alcohol/week: 0.0 standard drinks of alcohol   Drug use: No       OPHTHALMIC EXAM: Base Eye Exam     Visual Acuity (Snellen - Linear)       Right Left   Dist cc 20/80 -3 20/20 -2   Dist ph cc NI     Correction: Glasses         Tonometry (Tonopen, 9:28 AM)       Right Left   Pressure 17 15         Pupils       Pupils Dark Light Shape React APD   Right PERRL 4 2 Round Brisk None   Left PERRL 4 2 Round Brisk None         Visual Fields       Left Right    Full Full          Extraocular Movement       Right Left    Full, Ortho Full, Ortho         Neuro/Psych     Oriented x3: Yes   Mood/Affect: Normal         Dilation     Both eyes: 2.5% Phenylephrine @ 9:28 AM           Slit Lamp and Fundus Exam     Slit Lamp Exam       Right Left   Lids/Lashes Dermatochalasis - upper lid Dermatochalasis - upper lid   Conjunctiva/Sclera Injection, nasal pterygium with +vessels 1+ Injection, nasal Pinguecula   Cornea Nasal ptergyium extending 2.63mm onto nasal cornea 1+Punctate epithelial erosions   Anterior Chamber Deep, clear, narrow temporal angle, No cell or flare deep, narrow temporal angle, 1+pigment   Iris Round and dilated Round and dilated   Lens 2+ Nuclear sclerosis, 2+ Cortical cataract 2+ Nuclear sclerosis, 2+ Cortical cataract   Anterior Vitreous Mild Vitreous syneresis, Posterior vitreous detachment, vitreous condensations Mild Vitreous syneresis         Fundus Exam       Right Left   Disc Pink and Sharp, +cupping Pink and Sharp, +cupping, shallow peripapillary SRF just nasal to disc   C/D Ratio 0.65 0.6   Macula Flat, Blunted foveal reflex, persistent SRF -- slightly improved, RPE mottling, clumping and atrophy, No heme Flat, good foveal reflex, Epiretinal membrane, mild RPE mottling and clumping superiorly, No heme or edema   Vessels attenuated, Tortuous Vascular attenuation, mild Tortuousity   Periphery Attached, bullous Retinoschisis cavity from 0700-0900 -- no associated RT/RD - stable from prior, shallow schisis cavity ST, mild peripheral cystoid degeneration, pigmented cystoid degeneration inferiorly Attached, temporal retinoschisis cavity from 0300-0430 peripherally -- stable from prior, pigmented cystoid degeneration inferiorly           Refraction     Wearing Rx       Sphere Cylinder Axis   Right +1.25 +1.25 112   Left +2.25 +1.00 101           IMAGING AND PROCEDURES  Imaging and Procedures for @TODAY @  OCT,  Retina - OU - Both Eyes       Right Eye Quality was good. Central Foveal Thickness: 261. Progression has worsened. Findings include normal foveal contour, no IRF, intraretinal hyper-reflective material, subretinal fluid, outer retinal atrophy, vitreomacular adhesion (stable improvement  in IRF; interval increase in central SRF, + ORA, persistent bullous schisis IT periphery caught on widefield ).   Left Eye Quality was good. Central Foveal Thickness: 301. Progression has worsened. Findings include normal foveal contour, no IRF, no SRF, subretinal fluid, vitreomacular adhesion (Small pocket of SRF nasal disc caught on widefield -- slightly increased, bullous schisis cavity IT periphery caught on widefield).   Notes *Images captured and stored on drive  Diagnosis / Impression:  OD: stable improvement in IRF; interval increase in central SRF, + ORA, persistent bullous schisis IT periphery caught on widefield  OS: Small pocket of SRF nasal disc caught on widefield -- slightly increased, bullous schisis cavity IT periphery caught on widefield  Clinical management:  See below  Abbreviations: NFP - Normal foveal profile. CME - cystoid macular edema. PED - pigment epithelial detachment. IRF - intraretinal fluid. SRF - subretinal fluid. EZ - ellipsoid zone. ERM - epiretinal membrane. ORA - outer retinal atrophy. ORT - outer retinal tubulation. SRHM - subretinal hyper-reflective material      Repair Retinal Breaks, Laser - OS - Left Eye       LASER PROCEDURE NOTE  Procedure:  Barrier laser retinopexy using slit lamp laser, left eye   Diagnosis:      bullous retinoschisis cavity IT quadrant (0300-0500), left eye  Surgeon: [name], MD, PhD  Anesthesia: Topical  Informed consent obtained, operative eye marked, and time out performed prior to initiation of laser.   Laser settings:  Lumenis [id number] laser, slit lamp Lens: Mainster PRP 165 Power: 300 mW Spot size: 200  microns Duration: 30 msec  # spots: 443  Placement of laser: Using a Mainster PRP 165 contact lens at the slit lamp, laser was placed in three+ confluent rows posterior to schisis cavity spanning 0300-0500 oclock.  Complications: None.  Patient tolerated the procedure well and received written and verbal post-procedure care information/education.            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 Essential hypertension  I10     5. Hypertensive retinopathy of both eyes  H35.033     6. Combined forms of age-related cataract of both eyes  H25.813     7. Bilateral retinal defect  H33.303 Repair Retinal Breaks, Laser - OS - Left Eye      1,2. CSCR OD -- chronic  - pt reports decreased vision OD since December 2019  - reports significant stressors in life -- work  - denies steroid use - FA (09.30.20) shows focal hyperfluorescent leakage points nasal macula OD -- expansile dot phenotype? - started 50 mg po eplerenone since 09.30.20  -- pt self d/c in February, re-started in June 2021 - s/p IVA OD #1 (09.01.21), #2 (09.29.21), #3 (11.03.21), #4 (12.03.21) -- IVA resistance - s/p IVE OD #1 (01.20.22), #2 (02.18.22), #3 (04.05.22-sample), #4 (05.23.22), # 5 (06.27.22), #6 (07.28.22), #7 (08.26.22), #8 (09.23.22), #9 (10.21.22), #10 (11.18.22), #11 (12.16.22), #12 (01.20.23), #13 (03.02.23), #14 (04.07.23), #15 (05.12.23), #16 (06.09.23), #17 (07.14.23), #18 (08.08.23), #19 (09.08.23), #20 (10.06.23), #21 (11.06.23), #22 (12.04.23), #23 (01.08.24), #24 (02.12.24), #25 (03.18.24), #26 (04.22.24), #27 (05.20.24) -- IVE resistance ============================================================ - s/p IVV OD  #  1 (07.02.24), #2 (07.30.24)  - BCVA OD 20/80- stable - OCT shows OD: stable improvement in IRF; interval increase in central SRF, + ORA, persistent bullous schisis IT periphery caught on widefield; OS: Small pocket of SRF nasal disc caught on widefield -- slightly increased, bullous schisis cavity IT periphery caught on widefield  - differential includes atypical exudative ARMD  - IVV informed consent obtained and signed, 07.02.24  - continue po eplerenone 50 mg daily  - f/u August 27 or after -- DFE/OCT, possible injection  3. Retinoschisis OU (OD > OS)  - bullous retinoschisis cavity IT quadrant OD-- 2130-8657 oclock  - bullous retinoschisis cavity IT quadrant OS -- 8469-6295 oclock  - no associated retinal holes/RT/RD on scleral depression   - widefield [employer] imaging (04.22.24) shows mild interval progression of schisis cavities' size and posterior extension in comparison to baseline images  - discussed findings and prognosis  - recommend laser retinopexy OS today, 08.12.24 - pt wishes to proceed with laser - RBA of procedure discussed, questions answered - informed consent obtained and signed - start PF QID OS x7 days - see procedure note  4,5. Hypertensive retinopathy OU  - discussed importance of tight BP control  - monitor  6. Mixed form age related cataracts OU - The symptoms of cataract, surgical options, and treatments and risks were discussed with patient.  - discussed diagnosis and progression  - monitor  Ophthalmic Meds Ordered this visit:  Meds ordered this encounter  Medications   DISCONTD: prednisoLONE acetate (PRED FORTE) 1 % ophthalmic suspension    Sig: Place 1 drop into the left eye 4 (four) times daily for 7 days.    Dispense:  5 mL    Refill:  0   eplerenone (INSPRA) 50 MG tablet    Sig: Take 1 tablet (50 mg total) by mouth daily.    Dispense:  30 tablet    Refill:  10   prednisoLONE acetate (PRED FORTE) 1 % ophthalmic suspension    Sig: Place 1 drop  into the left eye 4 (four) times daily for 7 days.    Dispense:  10 mL    Refill:  0     Return for f/u August 27 or later, exu ARMD OD, DFE, OCT.  There are no Patient Instructions on file for this visit.  This document serves as a record of services personally performed by Karie Chimera, MD, PhD. It was created on their behalf by Annalee Genta, COMT. The creation of this record is the provider's dictation and/or activities during the visit.  Electronically signed by: Annalee Genta, COMT 05/23/23 10:40 AM  This document serves as a record of services personally performed by Karie Chimera, MD, PhD. It was created on their behalf by Glee Arvin. Manson Passey, OA an ophthalmic technician. The creation of this record is the provider's dictation and/or activities during the visit.    Electronically signed by: Glee Arvin. Manson Passey, OA 05/23/23 10:40 AM   Karie Chimera, M.D., Ph.D. Diseases & Surgery of the Retina and Vitreous [facility]  I have reviewed the above documentation for accuracy and completeness, and I agree with the above. Karie Chimera, M.D., Ph.D. 05/23/23 10:41 AM   Abbreviations: M myopia (nearsighted); A astigmatism; H hyperopia (farsighted); P presbyopia; Mrx spectacle prescription;  CTL contact lenses; OD right eye; OS left eye; OU both eyes  XT exotropia; ET esotropia; PEK punctate epithelial keratitis; PEE punctate epithelial erosions; DES dry eye syndrome;  MGD meibomian gland dysfunction; ATs artificial tears; PFAT's preservative free artificial tears; NSC nuclear sclerotic cataract; PSC posterior subcapsular cataract; ERM epi-retinal membrane; PVD posterior vitreous detachment; RD retinal detachment; DM diabetes mellitus; DR diabetic retinopathy; NPDR non-proliferative diabetic retinopathy; PDR proliferative diabetic retinopathy; CSME clinically significant macular edema; DME diabetic macular edema; dbh dot blot hemorrhages; CWS cotton wool spot; POAG  primary open angle glaucoma; C/D cup-to-disc ratio; HVF humphrey visual field; GVF goldmann visual field; OCT optical coherence tomography; IOP intraocular pressure; BRVO Branch retinal vein occlusion; CRVO central retinal vein occlusion; CRAO central retinal artery occlusion; BRAO branch retinal artery occlusion; RT retinal tear; SB scleral buckle; PPV pars plana vitrectomy; VH Vitreous hemorrhage; PRP panretinal laser photocoagulation; IVK intravitreal kenalog; VMT vitreomacular traction; MH Macular hole;  NVD neovascularization of the disc; NVE neovascularization elsewhere; AREDS age related eye disease study; ARMD age related macular degeneration; POAG primary open angle glaucoma; EBMD epithelial/anterior basement membrane dystrophy; ACIOL anterior chamber intraocular lens; IOL intraocular lens; PCIOL posterior chamber intraocular lens; Phaco/IOL phacoemulsification with intraocular lens placement; PRK photorefractive keratectomy; LASIK laser assisted in situ keratomileusis; HTN hypertension; DM diabetes mellitus; COPD chronic obstructive pulmonary disease

## 2023-05-23 ENCOUNTER — Ambulatory Visit (INDEPENDENT_AMBULATORY_CARE_PROVIDER_SITE_OTHER): Payer: Commercial Managed Care - PPO | Admitting: Ophthalmology

## 2023-05-23 ENCOUNTER — Other Ambulatory Visit: Payer: Self-pay | Admitting: *Deleted

## 2023-05-23 ENCOUNTER — Encounter (INDEPENDENT_AMBULATORY_CARE_PROVIDER_SITE_OTHER): Payer: Self-pay | Admitting: Ophthalmology

## 2023-05-23 DIAGNOSIS — H25813 Combined forms of age-related cataract, bilateral: Secondary | ICD-10-CM

## 2023-05-23 DIAGNOSIS — H353211 Exudative age-related macular degeneration, right eye, with active choroidal neovascularization: Secondary | ICD-10-CM | POA: Diagnosis not present

## 2023-05-23 DIAGNOSIS — I10 Essential (primary) hypertension: Secondary | ICD-10-CM | POA: Diagnosis not present

## 2023-05-23 DIAGNOSIS — H35033 Hypertensive retinopathy, bilateral: Secondary | ICD-10-CM | POA: Diagnosis not present

## 2023-05-23 DIAGNOSIS — H35711 Central serous chorioretinopathy, right eye: Secondary | ICD-10-CM | POA: Diagnosis not present

## 2023-05-23 DIAGNOSIS — H33103 Unspecified retinoschisis, bilateral: Secondary | ICD-10-CM

## 2023-05-23 DIAGNOSIS — N871 Moderate cervical dysplasia: Secondary | ICD-10-CM

## 2023-05-23 DIAGNOSIS — H33303 Unspecified retinal break, bilateral: Secondary | ICD-10-CM

## 2023-05-23 MED ORDER — EPLERENONE 50 MG PO TABS: 50.0000 mg | ORAL_TABLET | Freq: Every day | ORAL | 10 refills | Status: DC

## 2023-05-23 MED ORDER — PREDNISOLONE ACETATE 1 % OP SUSP: 1.0000 [drp] | Freq: Four times a day (QID) | OPHTHALMIC | 0 refills | Status: AC

## 2023-05-23 MED ORDER — PREDNISOLONE ACETATE 1 % OP SUSP: 1.0000 [drp] | Freq: Four times a day (QID) | OPHTHALMIC | 0 refills | Status: DC

## 2023-05-24 NOTE — Progress Notes (Signed)
[facility] - Clinic Note  06/07/2023     CHIEF COMPLAINT Patient presents for Retina Follow Up   HISTORY OF PRESENT ILLNESS: [name] is a 52 y.o. female who presents to the clinic today for:  HPI     Retina Follow Up   In right eye.  This started 4 weeks ago.  Duration of 4 weeks.  Since onset it is stable.  I, the attending physician,  performed the HPI with the patient and updated documentation appropriately.        Comments   4 week retina follow up CSCR OD and IVV OD pt is reporting no vision changes noticed she denies flashes or floaters       Last edited by [name], MD on 06/07/2023  4:43 PM.    Pt states she had no problems after the laser procedure at last visit, she states her vision is the same  Referring physician: [name], PA-C [address] [place],  [place] [address]  HISTORICAL INFORMATION:   Selected notes from the MEDICAL RECORD NUMBER Referred by Dr. [name] for concern of sub-macular fluid / retinoschisis   CURRENT MEDICATIONS: No current outpatient medications on file. (Ophthalmic Drugs)   No current facility-administered medications for this visit. (Ophthalmic Drugs)   Current Outpatient Medications (Other)  Medication Sig   diphenhydrAMINE HCl (BENADRYL ALLERGY PO) Take by mouth.   eplerenone (INSPRA) 50 MG tablet Take 1 tablet (50 mg total) by mouth daily.   estradiol (VIVELLE-DOT) 0.05 MG/24HR patch Place 1 patch (0.05 mg total) onto the skin 2 (two) times a week.   fexofenadine (ALLEGRA) 180 MG tablet Take 180 mg by mouth daily.   hydrochlorothiazide (HYDRODIURIL) 25 MG tablet TAKE 1 TABLET BY MOUTH ONCE DAILY IN THE MORNING FOR 90 DAYS   lisinopril (PRINIVIL,ZESTRIL) 20 MG tablet Take 1 tablet (20 mg total) by mouth daily.   progesterone (PROMETRIUM) 100 MG capsule Take 1 capsule (100 mg total) by mouth at bedtime.   No current facility-administered medications for  this visit. (Other)   REVIEW OF SYSTEMS: ROS   Positive for: Cardiovascular, Eyes Negative for: Constitutional, Gastrointestinal, Neurological, Skin, Genitourinary, Musculoskeletal, HENT, Endocrine, Respiratory, Psychiatric, Allergic/Imm, Heme/Lymph Last edited by Etheleen Mayhew, COT on 06/07/2023  1:33 PM.     ALLERGIES Allergies  Allergen Reactions   Pollen Extract Itching   PAST MEDICAL HISTORY Past Medical History:  Diagnosis Date   Abnormal Pap smear of cervix    12-28-21 lgsil HPV HR +   Allergy    CIN I (cervical intraepithelial neoplasia I)    High risk HPV infection    Hypertension    Hypertensive retinopathy    OU   Missed ab AGUST 6 2014   Retina disorder 10/22/2019   pt states taking inspra for excess fluid in retina   Sciatic nerve injury    Past Surgical History:  Procedure Laterality Date   MYOMECTOMY  2008   FAMILY HISTORY Family History  Problem Relation Age of Onset   Hypertension Mother    Diabetes Father    Diabetes Maternal Grandmother    Hypertension Maternal Grandmother    Colon cancer Neg Hx    Colon polyps Neg Hx    Esophageal cancer Neg Hx    Rectal cancer Neg Hx    Stomach cancer Neg Hx    SOCIAL HISTORY Social History   Tobacco Use   Smoking status: Never   Smokeless  tobacco: Never  Vaping Use   Vaping status: Never Used  Substance Use Topics   Alcohol use: No    Alcohol/week: 0.0 standard drinks of alcohol   Drug use: No       OPHTHALMIC EXAM: Base Eye Exam     Visual Acuity (Snellen - Linear)       Right Left   Dist cc 20/80 -3 20/25   Dist ph cc NI NI    Correction: Glasses         Tonometry (Tonopen, 1:35 PM)       Right Left   Pressure 15 15         Pupils       Pupils Dark Light Shape React APD   Right PERRL 4 2 Round Brisk None   Left PERRL 4 2 Round Brisk None         Visual Fields       Left Right    Full Full         Neuro/Psych     Oriented x3: Yes   Mood/Affect: Normal          Dilation     Both eyes: 2.5% Phenylephrine @ 1:35 PM           Slit Lamp and Fundus Exam     Slit Lamp Exam       Right Left   Lids/Lashes Dermatochalasis - upper lid Dermatochalasis - upper lid   Conjunctiva/Sclera Injection, nasal pterygium with +vessels 1+ Injection, nasal Pinguecula   Cornea Nasal ptergyium extending 2.81mm onto nasal cornea 1+Punctate epithelial erosions   Anterior Chamber Deep, clear, narrow temporal angle, No cell or flare deep, narrow temporal angle, 1+pigment   Iris Round and dilated Round and dilated   Lens 2+ Nuclear sclerosis, 2+ Cortical cataract 2+ Nuclear sclerosis, 2+ Cortical cataract   Anterior Vitreous Mild Vitreous syneresis, Posterior vitreous detachment, vitreous condensations Mild Vitreous syneresis         Fundus Exam       Right Left   Disc Pink and Sharp, +cupping Pink and Sharp, +cupping, shallow peripapillary SRF just nasal to disc   C/D Ratio 0.65 0.6   Macula Flat, Blunted foveal reflex, persistent SRF -- slightly improved, RPE mottling, clumping and atrophy, No heme Flat, good foveal reflex, Epiretinal membrane, mild RPE mottling and clumping superiorly, No heme or edema   Vessels attenuated, Tortuous Vascular attenuation, mild Tortuousity   Periphery Attached, bullous Retinoschisis cavity from 0700-0900 -- no associated RT/RD - stable from prior, shallow schisis cavity ST, mild peripheral cystoid degeneration, pigmented cystoid degeneration inferiorly Attached, temporal retinoschisis cavity from 0300-0430 peripherally -- stable from prior -- with good early laser changes posterior and surrounding, pigmented cystoid degeneration inferiorly           Refraction     Wearing Rx       Sphere Cylinder Axis   Right +1.25 +1.25 112   Left +2.25 +1.00 101           IMAGING AND PROCEDURES  Imaging and Procedures for @TODAY @  OCT, Retina - OU - Both Eyes       Right Eye Quality was good. Central Foveal  Thickness: 260. Progression has improved. Findings include normal foveal contour, no IRF, intraretinal hyper-reflective material, subretinal fluid, outer retinal atrophy, vitreomacular adhesion (stable improvement in IRF; mild interval improvement in central SRF, +ORA, persistent bullous schisis IT periphery caught on widefield ).   Left Eye Quality was good. Central  Foveal Thickness: 306. Progression has been stable. Findings include normal foveal contour, no IRF, no SRF, subretinal fluid, vitreomacular adhesion (Small pocket of SRF nasal disc caught on widefield -- persistent / slightly improved, bullous schisis cavity IT periphery caught on widefield -- not imaged today).   Notes *Images captured and stored on drive  Diagnosis / Impression:  OD: stable improvement in IRF; mild interval improvement in central SRF, +ORA, persistent bullous schisis IT periphery caught on widefield  OS: Small pocket of SRF nasal disc caught on widefield -- persistent / slightly improved, bullous schisis cavity IT periphery caught on widefield -- not imaged today  Clinical management:  See below  Abbreviations: NFP - Normal foveal profile. CME - cystoid macular edema. PED - pigment epithelial detachment. IRF - intraretinal fluid. SRF - subretinal fluid. EZ - ellipsoid zone. ERM - epiretinal membrane. ORA - outer retinal atrophy. ORT - outer retinal tubulation. SRHM - subretinal hyper-reflective material      Intravitreal Injection, Pharmacologic Agent - OD - Right Eye       Time Out 06/07/2023. 2:38 PM. Confirmed correct patient, procedure, site, and patient consented.   Anesthesia Topical anesthesia was used. Anesthetic medications included Lidocaine 2%, Proparacaine 0.5%.   Procedure Preparation included 5% betadine to ocular surface, eyelid speculum. A (32g) needle was used.   Injection: 6 mg faricimab-svoa 6 MG/0.05ML   Route: Intravitreal, Site: Right Eye   NDC: O8010301, Lot: [id number],  Expiration date: 06/10/2025, Waste: 0 mL   Post-op Post injection exam found visual acuity of at least counting fingers. The patient tolerated the procedure well. There were no complications. The patient received written and verbal post procedure care education. Post injection medications were not given.            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 Essential hypertension  I10     5. Hypertensive retinopathy of both eyes  H35.033     6. Combined forms of age-related cataract of both eyes  H25.813      1,2. CSCR OD -- chronic  - pt reports decreased vision OD since December 2019  - reports significant stressors in life -- work  - denies steroid use - FA (09.30.20) shows focal hyperfluorescent leakage points nasal macula OD -- expansile dot phenotype? - started 50 mg po eplerenone since 09.30.20  -- pt self d/c in February, re-started in June 2021 - s/p IVA OD #1 (09.01.21), #2 (09.29.21), #3 (11.03.21), #4 (12.03.21) -- IVA resistance - s/p IVE OD #1 (01.20.22), #2 (02.18.22), #3 (04.05.22-sample), #4 (05.23.22), # 5 (06.27.22), #6 (07.28.22), #7 (08.26.22), #8 (09.23.22), #9 (10.21.22), #10 (11.18.22), #11 (12.16.22), #12 (01.20.23), #13 (03.02.23), #14 (04.07.23), #15 (05.12.23), #16 (06.09.23), #17 (07.14.23), #18 (08.08.23), #19 (09.08.23), #20 (10.06.23), #21 (11.06.23), #22 (12.04.23), #23 (01.08.24), #24 (02.12.24), #25 (03.18.24), #26 (04.22.24), #27 (05.20.24) -- IVE resistance ============================================================ - s/p IVV OD #1 (07.02.24), #2  (07.30.24)  - BCVA OD 20/80 -- stable - OCT shows OD: stable improvement in IRF; mild interval improvement in central SRF, +ORA, persistent bullous schisis IT periphery caught  on widefield; OS: Small pocket of SRF nasal disc caught on widefield -- persistent / slightly improved, bullous schisis cavity IT periphery caught on widefield -- not imaged today at 4 wks  - differential includes atypical exudative ARMD - recommend IVV OD #3 today 08.27.24 - RBA of procedure discussed, questions answered - see procedure note - IVV informed consent obtained and signed, 07.02.24  - continue po eplerenone 50 mg daily  - 4 weeks -- DFE/OCT, possible injection  3. Retinoschisis OU (OD > OS)  - bullous retinoschisis cavity IT quadrant OD-- 6962-9528 oclock  - bullous retinoschisis cavity IT quadrant OS -- 4132-4401 oclock  - no associated retinal holes/RT/RD on scleral depression   - widefield [employer] imaging (04.22.24) shows mild interval progression of schisis cavities' size and posterior extension in comparison to baseline images  - s/p laser retinopexy OS 08.12.24 -- good early laser changes surrounding - monitor  4,5. Hypertensive retinopathy OU  - discussed importance of tight BP control  - monitor  6. Mixed form age related cataracts OU - The symptoms of cataract, surgical options, and treatments and risks were discussed with patient.  - discussed diagnosis and progression  - monitor  Ophthalmic Meds Ordered this visit:  Meds ordered this encounter  Medications   faricimab-svoa (VABYSMO) 6mg /0.61mL intravitreal injection     Return in about 4 weeks (around 07/05/2023) for f/u exu ARMD OD, DFE, OCT.  There are no Patient Instructions on file for this visit.  This document serves as a record of services personally performed by Karie Chimera, MD, PhD. It was created on their behalf by Laurey Morale, COT an ophthalmic technician. The creation of this record is the provider's dictation  and/or activities during the visit.    Electronically signed by:  Charlette Caffey, COT  06/07/23 4:44 PM  This document serves as a record of services personally performed by Karie Chimera, MD, PhD. It was created on their behalf by Glee Arvin. Manson Passey, OA an ophthalmic technician. The creation of this record is the provider's dictation and/or activities during the visit.    Electronically signed by: Glee Arvin. Manson Passey, OA 06/07/23 4:44 PM  Karie Chimera, M.D., Ph.D. Diseases & Surgery of the Retina and Vitreous [facility]  I have reviewed the above documentation for accuracy and completeness, and I agree with the above. Karie Chimera, M.D., Ph.D. 06/07/23 4:45 PM  Abbreviations: M myopia (nearsighted); A astigmatism; H hyperopia (farsighted); P presbyopia; Mrx spectacle prescription;  CTL contact lenses; OD right eye; OS left eye; OU both eyes  XT exotropia; ET esotropia; PEK punctate epithelial keratitis; PEE punctate epithelial erosions; DES dry eye syndrome; MGD meibomian gland dysfunction; ATs artificial tears; PFAT's preservative free artificial tears; NSC nuclear sclerotic cataract; PSC posterior subcapsular cataract; ERM epi-retinal membrane; PVD posterior vitreous detachment; RD retinal detachment; DM diabetes mellitus; DR diabetic retinopathy; NPDR non-proliferative diabetic retinopathy; PDR proliferative diabetic retinopathy; CSME clinically significant macular edema; DME diabetic macular edema; dbh dot blot hemorrhages; CWS cotton wool spot; POAG primary open angle glaucoma; C/D cup-to-disc ratio; HVF humphrey visual field; GVF goldmann visual field; OCT optical coherence tomography; IOP intraocular pressure; BRVO Branch retinal vein occlusion; CRVO central retinal vein occlusion; CRAO central retinal artery occlusion; BRAO branch retinal artery occlusion; RT retinal tear; SB scleral buckle; PPV pars plana vitrectomy; VH Vitreous hemorrhage; PRP panretinal laser  photocoagulation; IVK intravitreal kenalog; VMT vitreomacular traction; MH Macular hole;  NVD neovascularization of the disc; NVE neovascularization  elsewhere; AREDS age related eye disease study; ARMD age related macular degeneration; POAG primary open angle glaucoma; EBMD epithelial/anterior basement membrane dystrophy; ACIOL anterior chamber intraocular lens; IOL intraocular lens; PCIOL posterior chamber intraocular lens; Phaco/IOL phacoemulsification with intraocular lens placement; PRK photorefractive keratectomy; LASIK laser assisted in situ keratomileusis; HTN hypertension; DM diabetes mellitus; COPD chronic obstructive pulmonary disease

## 2023-06-01 ENCOUNTER — Telehealth: Payer: Self-pay

## 2023-06-01 NOTE — Telephone Encounter (Signed)
Spoke with the patient regarding the referral to GYN oncology. Patient scheduled as new patient with Dr Pricilla Holm on 06/23/2023 Patient given an arrival time of 10:00am.  Explained to the patient the the doctor will perform a pelvic exam at this visit. Patient given the policy that only one visitor allowed and that visitor must be over 16 yrs are allowed in the Cancer Center. Patient given the address/phone number for the clinic and that the center offers free valet service. Patient aware that masks are option.

## 2023-06-07 ENCOUNTER — Encounter (INDEPENDENT_AMBULATORY_CARE_PROVIDER_SITE_OTHER): Payer: Self-pay | Admitting: Ophthalmology

## 2023-06-07 ENCOUNTER — Ambulatory Visit (INDEPENDENT_AMBULATORY_CARE_PROVIDER_SITE_OTHER): Payer: Commercial Managed Care - PPO | Admitting: Ophthalmology

## 2023-06-07 DIAGNOSIS — H353211 Exudative age-related macular degeneration, right eye, with active choroidal neovascularization: Secondary | ICD-10-CM | POA: Diagnosis not present

## 2023-06-07 DIAGNOSIS — H35033 Hypertensive retinopathy, bilateral: Secondary | ICD-10-CM

## 2023-06-07 DIAGNOSIS — H35711 Central serous chorioretinopathy, right eye: Secondary | ICD-10-CM | POA: Diagnosis not present

## 2023-06-07 DIAGNOSIS — H25813 Combined forms of age-related cataract, bilateral: Secondary | ICD-10-CM

## 2023-06-07 DIAGNOSIS — I10 Essential (primary) hypertension: Secondary | ICD-10-CM | POA: Diagnosis not present

## 2023-06-07 DIAGNOSIS — H33103 Unspecified retinoschisis, bilateral: Secondary | ICD-10-CM

## 2023-06-07 MED ORDER — FARICIMAB-SVOA 6 MG/0.05ML IZ SOLN
6.0000 mg | INTRAVITREAL | Status: AC | PRN
Start: 2023-06-07 — End: 2023-06-07
  Administered 2023-06-07: 6 mg via INTRAVITREAL

## 2023-06-22 NOTE — Progress Notes (Unsigned)
GYNECOLOGIC ONCOLOGY NEW PATIENT CONSULTATION   Patient Name: [name]  Patient Age: 52 y.o. 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  I discussed with the patient and her sister recent biopsies of the cervix as well as the upper vagina.  She has had abnormal Pap smears with subsequent biopsies showing low-grade dysplasia since 2018.  We discussed the role that HPV plays in the vast majority of cervical dysplasia and malignancy.  We also discussed that HPV can cause vaginal and vulvar dysplasia and cancer.  Her recent biopsy shows vaginal intraepithelial neoplasia 2.  The patient does not have significant medical comorbidities nor does she take medications that would cause immunosuppression.  She had HIV testing in 2020 that was negative.    While low-grade cervical dysplasia is typically treated with close surveillance, given persistent low-grade dysplasia with HPV testing showing high risk HPV for more than 2 years, is also reasonable to consider excisional procedure for both therapeutic purposes and to assure we have not missed higher grade dysplasia.  In terms of her vaginal dysplasia, based on exam today, there is very little vaginal abnormality appreciated.  There are a couple small areas of mildly decreased uptake after application of Lugol's.  We discussed treatment of high-grade vaginal dysplasia including surgical and topical treatments.  Given plan to proceed for excisional procedure of the cervix, I would recommend concurrent surgical treatment of her vaginal dysplasia.  I reviewed laser ablation and excision.  Based on exam findings today, I would favor taking some additional vaginal biopsies  and using laser to ablate these areas.  Ultimately, I would recommend making a decision about treatment of the vaginal dysplasia at the time of her procedure.  We discussed the plan for cold knife conization, endocervical curettage, possible vaginal biopsies, possible vaginal laser ablation versus excision, and any other indicated procedures.  We discussed the risk of surgery which include but are not limited to bleeding, need for blood transfusion, infection, damage to surrounding structures, VTE, postoperative pneumonia, stroke, heart attack, and rarely death.  All questions about the procedure and treatment options were answered.  Patient will receive antibiotic and DVT prophylaxis as indicated.  Perioperative instructions were reviewed with her today.  Postoperative medications were sent to her pharmacy.  An in person interpreter was used for the entirety of this visit.  A copy of this note was sent to the patient's referring provider.   65 minutes of total time was spent for this patient encounter, including preparation, face-to-face counseling with the patient and coordination of care, and documentation of the encounter.  Eugene Garnet, MD  Division of Gynecologic Oncology  Department of Obstetrics and Gynecology  [facility]  ___________________________________________  Chief Complaint: Chief Complaint  Patient presents with   CIN II (cervical intraepithelial neoplasia II)    History of Present Illness:  [name] is a 52 y.o. y.o. female who is seen in consultation at the request of Dr. Edward Jolly for an evaluation of cervical and vaginal dysplasia.  The patient reports normal Pap smears up until 2018.  Her Pap smear history is outlined below. 06/2014: NIML pap, HR HPV neg 07/2017: LSIL pap, HR HPV + 08/2017: cervical biopsy with focal koilocytotic atypia, no definitive dysplasia 02/2018:  LSIL pap, HR HPV + 03/2018: cervical biopsy -  CIN1 10/2018: NIML pap, HR HPV neg 10/2019: LSIL pap, HPV 16/18 negative 12/2019: cervical biopsy with focal koilocytotic atypia, no definitive dysplasia 06/2020: LSIL pap, HR HPV + 11/2020: LSIL pap 01/2021: cervical biopsy - CIN1 12/2021: LSIL pap, HR HPV + (16/18/45 negative) 01/2022: cervical biopsies - CIN1 02/2023: LSIL pap, HR HPV + 05/2023: ECC - CIN1; right vaginal apex biopsy Shirlyn Goltz  The patient reports overall doing well.  Her last menstrual cycle was in May 2023.  She endorses a good appetite, denies any abdominal or pelvic pain regularly although notes that when she had her period, she would occasionally have some pinpoint right lower quadrant pain.  She endorses normal bowel function.  She denies any urinary symptoms.  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  Age at menarche: 60 Age at menopause: 57 Hx of HRT: yes Hx of STDs: HPV Last pap: see HPI History of abnormal pap smears: see HPI  SCREENING STUDIES:  Last mammogram: 2024  Last colonoscopy: 2024  MEDICATIONS: Outpatient Encounter Medications as of 06/23/2023  Medication Sig   diphenhydrAMINE HCl (BENADRYL ALLERGY PO) Take by mouth.   estradiol (VIVELLE-DOT) 0.05 MG/24HR patch  Place 1 patch (0.05 mg total) onto the skin 2 (two) times a week.   fexofenadine (ALLEGRA) 180 MG tablet Take 180 mg by mouth daily.   hydrochlorothiazide (HYDRODIURIL) 25 MG tablet TAKE 1 TABLET BY MOUTH ONCE DAILY IN THE MORNING FOR 90 DAYS   lisinopril (PRINIVIL,ZESTRIL) 20 MG tablet Take 1 tablet (20 mg total) by mouth daily.   progesterone (PROMETRIUM) 100 MG capsule Take 1 capsule (100 mg total) by mouth at bedtime.   senna-docusate (SENOKOT-S) 8.6-50 MG tablet Take 2 tablets by mouth at bedtime. For AFTER surgery, do not take if having diarrhea   traMADol (ULTRAM) 50 MG tablet Take 1 tablet (50 mg total) by mouth every 6 (six) hours as needed for severe pain. For AFTER surgery, do not take and drive   [DISCONTINUED] eplerenone (INSPRA) 50 MG tablet Take 1 tablet (50 mg total) by mouth daily.   No facility-administered encounter medications on file as of 06/23/2023.    ALLERGIES:  Allergies  Allergen Reactions   Pollen Extract Itching     FAMILY HISTORY:  Family History  Problem Relation Age of Onset   Hypertension Mother    Diabetes Father    Diabetes Maternal Grandmother  Hypertension Maternal Grandmother    Colon cancer Neg Hx    Colon polyps Neg Hx    Esophageal cancer Neg Hx    Rectal cancer Neg Hx    Stomach cancer Neg Hx      SOCIAL HISTORY:  Social Connections: Unknown (02/23/2022)   Received from [facility], [facility]   Social Network    Social Network: Not on file    REVIEW OF SYSTEMS:  Denies appetite changes, fevers, chills, fatigue, unexplained weight changes. Denies hearing loss, neck lumps or masses, mouth sores, ringing in ears or voice changes. Denies cough or wheezing.  Denies shortness of breath. Denies chest pain or palpitations. Denies leg swelling. Denies abdominal distention, pain, blood in stools, constipation, diarrhea, nausea, vomiting, or early satiety. Denies pain with intercourse, dysuria, frequency, hematuria or  incontinence. Denies hot flashes, pelvic pain, vaginal bleeding or vaginal discharge.   Denies joint pain, back pain or muscle pain/cramps. Denies itching, rash, or wounds. Denies dizziness, headaches, numbness or seizures. Denies swollen lymph nodes or glands, denies easy bruising or bleeding. Denies anxiety, depression, confusion, or decreased concentration.  Physical Exam:  Vital Signs for this encounter:  Blood pressure 126/84, pulse 86, temperature 98.4 F (36.9 C), temperature source Oral, resp. rate 16, height 5\' 4"  (1.626 m), weight 166 lb 6.4 oz (75.5 kg), last menstrual period 02/18/2022, SpO2 100%. Body mass index is 28.56 kg/m. General: Alert, oriented, no acute distress.  HEENT: Normocephalic, atraumatic. Sclera anicteric.  Chest: Clear to auscultation bilaterally. No wheezes, rhonchi, or rales. Cardiovascular: Regular rate and rhythm, no murmurs, rubs, or gallops.  Abdomen: Normoactive bowel sounds. Soft, nondistended, nontender to palpation. No masses or hepatosplenomegaly appreciated. No palpable fluid wave.  Extremities: Grossly normal range of motion. Warm, well perfused. No edema bilaterally.  Skin: No rashes or lesions.  Lymphatics: No cervical, supraclavicular, or inguinal adenopathy.  GU:  Normal external female genitalia. No lesions. No discharge or bleeding.             Bladder/urethra:  No lesions or masses, well supported bladder             Vagina: Well-rugated, no lesions.             Cervix: Normal appearing, no lesions.             Uterus: Small, mobile, no parametrial involvement or nodularity.             Adnexa: No masses or tenderness to palpation.  Rectal: Deferred.  After application of Lugol's, no decreased uptake noted on the face of the cervix.  Couple very small, less than 3 mm, areas of decreased uptake within the upper vagina on both the right and left.  No other areas of decreased uptake.  LABORATORY AND RADIOLOGIC DATA:  Outside medical  records were reviewed to synthesize the above history, along with the history and physical obtained during the visit.   Lab Results  Component Value Date   WBC 6.8 08/03/2017   HGB 11.9 08/03/2017   HCT 36.5 08/03/2017   PLT 420 (H) 08/03/2017   GLUCOSE 80 08/03/2017   CHOL 172 08/03/2017   TRIG 86 08/03/2017   HDL 48 (L) 08/03/2017   LDLCALC 106 (H) 08/03/2017   ALT 18 08/03/2017   AST 16 08/03/2017   NA 137 08/03/2017   K 3.7 08/03/2017   CL 104 08/03/2017   CREATININE 0.60 08/03/2017   BUN 9 08/03/2017   CO2 25 08/03/2017   TSH 0.79  08/03/2017   INR 1.2 (H) 08/21/2015

## 2023-06-22 NOTE — H&P (View-Only) (Signed)
GYNECOLOGIC ONCOLOGY NEW PATIENT CONSULTATION   Patient Name: [name]  Patient Age: 52 y.o. 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  I discussed with the patient and her sister recent biopsies of the cervix as well as the upper vagina.  She has had abnormal Pap smears with subsequent biopsies showing low-grade dysplasia since 2018.  We discussed the role that HPV plays in the vast majority of cervical dysplasia and malignancy.  We also discussed that HPV can cause vaginal and vulvar dysplasia and cancer.  Her recent biopsy shows vaginal intraepithelial neoplasia 2.  The patient does not have significant medical comorbidities nor does she take medications that would cause immunosuppression.  She had HIV testing in 2020 that was negative.    While low-grade cervical dysplasia is typically treated with close surveillance, given persistent low-grade dysplasia with HPV testing showing high risk HPV for more than 2 years, is also reasonable to consider excisional procedure for both therapeutic purposes and to assure we have not missed higher grade dysplasia.  In terms of her vaginal dysplasia, based on exam today, there is very little vaginal abnormality appreciated.  There are a couple small areas of mildly decreased uptake after application of Lugol's.  We discussed treatment of high-grade vaginal dysplasia including surgical and topical treatments.  Given plan to proceed for excisional procedure of the cervix, I would recommend concurrent surgical treatment of her vaginal dysplasia.  I reviewed laser ablation and excision.  Based on exam findings today, I would favor taking some additional vaginal biopsies  and using laser to ablate these areas.  Ultimately, I would recommend making a decision about treatment of the vaginal dysplasia at the time of her procedure.  We discussed the plan for cold knife conization, endocervical curettage, possible vaginal biopsies, possible vaginal laser ablation versus excision, and any other indicated procedures.  We discussed the risk of surgery which include but are not limited to bleeding, need for blood transfusion, infection, damage to surrounding structures, VTE, postoperative pneumonia, stroke, heart attack, and rarely death.  All questions about the procedure and treatment options were answered.  Patient will receive antibiotic and DVT prophylaxis as indicated.  Perioperative instructions were reviewed with her today.  Postoperative medications were sent to her pharmacy.  An in person interpreter was used for the entirety of this visit.  A copy of this note was sent to the patient's referring provider.   54 minutes of total time was spent for this patient encounter, including preparation, face-to-face counseling with the patient and coordination of care, and documentation of the encounter.  Eugene Garnet, MD  Division of Gynecologic Oncology  Department of Obstetrics and Gynecology  [facility]  ___________________________________________  Chief Complaint: Chief Complaint  Patient presents with   CIN II (cervical intraepithelial neoplasia II)    History of Present Illness:  [name] is a 52 y.o. y.o. female who is seen in consultation at the request of Dr. Edward Jolly for an evaluation of cervical and vaginal dysplasia.  The patient reports normal Pap smears up until 2018.  Her Pap smear history is outlined below. 06/2014: NIML pap, HR HPV neg 07/2017: LSIL pap, HR HPV + 08/2017: cervical biopsy with focal koilocytotic atypia, no definitive dysplasia 02/2018:  LSIL pap, HR HPV + 03/2018: cervical biopsy -  CIN1 10/2018: NIML pap, HR HPV neg 10/2019: LSIL pap, HPV 16/18 negative 12/2019: cervical biopsy with focal koilocytotic atypia, no definitive dysplasia 06/2020: LSIL pap, HR HPV + 11/2020: LSIL pap 01/2021: cervical biopsy - CIN1 12/2021: LSIL pap, HR HPV + (16/18/45 negative) 01/2022: cervical biopsies - CIN1 02/2023: LSIL pap, HR HPV + 05/2023: ECC - CIN1; right vaginal apex biopsy Shirlyn Goltz  The patient reports overall doing well.  Her last menstrual cycle was in May 2023.  She endorses a good appetite, denies any abdominal or pelvic pain regularly although notes that when she had her period, she would occasionally have some pinpoint right lower quadrant pain.  She endorses normal bowel function.  She denies any urinary symptoms.  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  Age at menarche: 36 Age at menopause: 40 Hx of HRT: yes Hx of STDs: HPV Last pap: see HPI History of abnormal pap smears: see HPI  SCREENING STUDIES:  Last mammogram: 2024  Last colonoscopy: 2024  MEDICATIONS: Outpatient Encounter Medications as of 06/23/2023  Medication Sig   diphenhydrAMINE HCl (BENADRYL ALLERGY PO) Take by mouth.   estradiol (VIVELLE-DOT) 0.05 MG/24HR patch  Place 1 patch (0.05 mg total) onto the skin 2 (two) times a week.   fexofenadine (ALLEGRA) 180 MG tablet Take 180 mg by mouth daily.   hydrochlorothiazide (HYDRODIURIL) 25 MG tablet TAKE 1 TABLET BY MOUTH ONCE DAILY IN THE MORNING FOR 90 DAYS   lisinopril (PRINIVIL,ZESTRIL) 20 MG tablet Take 1 tablet (20 mg total) by mouth daily.   progesterone (PROMETRIUM) 100 MG capsule Take 1 capsule (100 mg total) by mouth at bedtime.   senna-docusate (SENOKOT-S) 8.6-50 MG tablet Take 2 tablets by mouth at bedtime. For AFTER surgery, do not take if having diarrhea   traMADol (ULTRAM) 50 MG tablet Take 1 tablet (50 mg total) by mouth every 6 (six) hours as needed for severe pain. For AFTER surgery, do not take and drive   [DISCONTINUED] eplerenone (INSPRA) 50 MG tablet Take 1 tablet (50 mg total) by mouth daily.   No facility-administered encounter medications on file as of 06/23/2023.    ALLERGIES:  Allergies  Allergen Reactions   Pollen Extract Itching     FAMILY HISTORY:  Family History  Problem Relation Age of Onset   Hypertension Mother    Diabetes Father    Diabetes Maternal Grandmother  Hypertension Maternal Grandmother    Colon cancer Neg Hx    Colon polyps Neg Hx    Esophageal cancer Neg Hx    Rectal cancer Neg Hx    Stomach cancer Neg Hx      SOCIAL HISTORY:  Social Connections: Unknown (02/23/2022)   Received from [facility], [facility]   Social Network    Social Network: Not on file    REVIEW OF SYSTEMS:  Denies appetite changes, fevers, chills, fatigue, unexplained weight changes. Denies hearing loss, neck lumps or masses, mouth sores, ringing in ears or voice changes. Denies cough or wheezing.  Denies shortness of breath. Denies chest pain or palpitations. Denies leg swelling. Denies abdominal distention, pain, blood in stools, constipation, diarrhea, nausea, vomiting, or early satiety. Denies pain with intercourse, dysuria, frequency, hematuria or  incontinence. Denies hot flashes, pelvic pain, vaginal bleeding or vaginal discharge.   Denies joint pain, back pain or muscle pain/cramps. Denies itching, rash, or wounds. Denies dizziness, headaches, numbness or seizures. Denies swollen lymph nodes or glands, denies easy bruising or bleeding. Denies anxiety, depression, confusion, or decreased concentration.  Physical Exam:  Vital Signs for this encounter:  Blood pressure 126/84, pulse 86, temperature 98.4 F (36.9 C), temperature source Oral, resp. rate 16, height 5\' 4"  (1.626 m), weight 166 lb 6.4 oz (75.5 kg), last menstrual period 02/18/2022, SpO2 100%. Body mass index is 28.56 kg/m. General: Alert, oriented, no acute distress.  HEENT: Normocephalic, atraumatic. Sclera anicteric.  Chest: Clear to auscultation bilaterally. No wheezes, rhonchi, or rales. Cardiovascular: Regular rate and rhythm, no murmurs, rubs, or gallops.  Abdomen: Normoactive bowel sounds. Soft, nondistended, nontender to palpation. No masses or hepatosplenomegaly appreciated. No palpable fluid wave.  Extremities: Grossly normal range of motion. Warm, well perfused. No edema bilaterally.  Skin: No rashes or lesions.  Lymphatics: No cervical, supraclavicular, or inguinal adenopathy.  GU:  Normal external female genitalia. No lesions. No discharge or bleeding.             Bladder/urethra:  No lesions or masses, well supported bladder             Vagina: Well-rugated, no lesions.             Cervix: Normal appearing, no lesions.             Uterus: Small, mobile, no parametrial involvement or nodularity.             Adnexa: No masses or tenderness to palpation.  Rectal: Deferred.  After application of Lugol's, no decreased uptake noted on the face of the cervix.  Couple very small, less than 3 mm, areas of decreased uptake within the upper vagina on both the right and left.  No other areas of decreased uptake.  LABORATORY AND RADIOLOGIC DATA:  Outside medical  records were reviewed to synthesize the above history, along with the history and physical obtained during the visit.   Lab Results  Component Value Date   WBC 6.8 08/03/2017   HGB 11.9 08/03/2017   HCT 36.5 08/03/2017   PLT 420 (H) 08/03/2017   GLUCOSE 80 08/03/2017   CHOL 172 08/03/2017   TRIG 86 08/03/2017   HDL 48 (L) 08/03/2017   LDLCALC 106 (H) 08/03/2017   ALT 18 08/03/2017   AST 16 08/03/2017   NA 137 08/03/2017   K 3.7 08/03/2017   CL 104 08/03/2017   CREATININE 0.60 08/03/2017   BUN 9 08/03/2017   CO2 25 08/03/2017   TSH 0.79  08/03/2017   INR 1.2 (H) 08/21/2015

## 2023-06-23 ENCOUNTER — Inpatient Hospital Stay (HOSPITAL_BASED_OUTPATIENT_CLINIC_OR_DEPARTMENT_OTHER): Payer: Commercial Managed Care - PPO | Admitting: Gynecologic Oncology

## 2023-06-23 ENCOUNTER — Inpatient Hospital Stay: Payer: Commercial Managed Care - PPO | Attending: Gynecologic Oncology | Admitting: Gynecologic Oncology

## 2023-06-23 ENCOUNTER — Encounter: Payer: Self-pay | Admitting: Gynecologic Oncology

## 2023-06-23 VITALS — BP 126/84 | HR 86 | Temp 98.4°F | Resp 16 | Ht 64.0 in | Wt 166.4 lb

## 2023-06-23 DIAGNOSIS — Z79890 Hormone replacement therapy (postmenopausal): Secondary | ICD-10-CM | POA: Insufficient documentation

## 2023-06-23 DIAGNOSIS — Z79899 Other long term (current) drug therapy: Secondary | ICD-10-CM | POA: Insufficient documentation

## 2023-06-23 DIAGNOSIS — I10 Essential (primary) hypertension: Secondary | ICD-10-CM | POA: Insufficient documentation

## 2023-06-23 DIAGNOSIS — N893 Dysplasia of vagina, unspecified: Secondary | ICD-10-CM | POA: Insufficient documentation

## 2023-06-23 DIAGNOSIS — N870 Mild cervical dysplasia: Secondary | ICD-10-CM

## 2023-06-23 DIAGNOSIS — N871 Moderate cervical dysplasia: Secondary | ICD-10-CM | POA: Insufficient documentation

## 2023-06-23 DIAGNOSIS — N891 Moderate vaginal dysplasia: Secondary | ICD-10-CM

## 2023-06-23 DIAGNOSIS — H35039 Hypertensive retinopathy, unspecified eye: Secondary | ICD-10-CM | POA: Diagnosis not present

## 2023-06-23 DIAGNOSIS — Z780 Asymptomatic menopausal state: Secondary | ICD-10-CM | POA: Diagnosis not present

## 2023-06-23 DIAGNOSIS — B977 Papillomavirus as the cause of diseases classified elsewhere: Secondary | ICD-10-CM | POA: Diagnosis not present

## 2023-06-23 MED ORDER — TRAMADOL HCL 50 MG PO TABS
50.0000 mg | ORAL_TABLET | Freq: Four times a day (QID) | ORAL | 0 refills | Status: DC | PRN
Start: 2023-06-23 — End: 2023-08-12

## 2023-06-23 MED ORDER — SENNOSIDES-DOCUSATE SODIUM 8.6-50 MG PO TABS
2.0000 | ORAL_TABLET | Freq: Every day | ORAL | 0 refills | Status: DC
Start: 2023-06-23 — End: 2023-08-12

## 2023-06-23 NOTE — Patient Instructions (Signed)
Preparing for your Surgery  Plan for surgery on June 30, 2023 with Dr. Eugene Garnet at [facility]. You will be scheduled for cold knife conization of the cervix (cone shaped biopsy of the cervix), endocervical curettage (sampling from the inner canal of the cervix), vaginal laser versus excision, possible biopsies.   Pre-operative Testing -You will receive a phone call from presurgical testing at [facility] to discuss surgery instructions and arrange for lab work if needed.  -Bring your insurance card, copy of an advanced directive if applicable, medication list.  -You should not be taking blood thinners or aspirin at least ten days prior to surgery unless instructed by your surgeon.  -Do not take supplements such as fish oil (omega 3), red yeast rice, turmeric before your surgery. You want to avoid medications with aspirin in them including headache powders such as BC or Goody's), Excedrin migraine.  Day Before Surgery at Home -You will be advised you can have clear liquids up until 3 hours before your surgery.    Your role in recovery Your role is to become active as soon as directed by your doctor, while still giving yourself time to heal.  Rest when you feel tired. You will be asked to do the following in order to speed your recovery:  - Cough and breathe deeply. This helps to clear and expand your lungs and can prevent pneumonia after surgery.  - STAY ACTIVE WHEN YOU GET HOME. Do mild physical activity. Walking or moving your legs help your circulation and body functions return to normal. Do not try to get up or walk alone the first time after surgery.   -If you develop swelling on one leg or the other, pain in the back of your leg, redness/warmth in one of your legs, please call the office or go to the Emergency Room to have a doppler to rule out a blood clot. For shortness of breath, chest pain-seek care in the Emergency Room as soon as  possible. - Actively manage your pain. Managing your pain lets you move in comfort. We will ask you to rate your pain on a scale of zero to 10. It is your responsibility to tell your doctor or nurse where and how much you hurt so your pain can be treated.  Special Considerations -Your final pathology results from surgery should be available around one week after surgery and the results will be relayed to you when available.  -FMLA forms can be faxed to [phone] and please allow 5-7 business days for completion.  Pain Management After Surgery -You will be prescribed your pain medication and bowel regimen medications before surgery so that you can have these available when you are discharged from the hospital. The pain medication is for use ONLY AFTER surgery and a new prescription will not be given.   -Make sure that you have Tylenol and Ibuprofen at home IF YOU ARE ABLE TO TAKE THESE MEDICATIONS to use on a regular basis after surgery for pain control. We recommend alternating the medications every hour to six hours since they work differently and are processed in the body differently for pain relief.  -Review the attached handout on narcotic use and their risks and side effects.   Bowel Regimen -You will be prescribed Sennakot-S to take nightly to prevent constipation especially if you are taking the narcotic pain medication intermittently.  It is important to prevent constipation and drink adequate amounts of liquids. You can stop taking this  medication when you are not taking pain medication and you are back on your normal bowel routine.  Risks of Surgery Risks of surgery are low but include bleeding, infection, damage to surrounding structures, re-operation, blood clots, and very rarely death.  AFTER SURGERY INSTRUCTIONS  Return to work:  1-2 weeks if applicable, based on occupation  Activity: 1. Be up and out of the bed during the day.  Take a nap if needed.  You may walk up steps  but be careful and use the hand rail.  Stair climbing will tire you more than you think, you may need to stop part way and rest.   2. No lifting or straining for 1 week over 10 pounds. No pushing, pulling, straining for 1 week.  3. No driving for minimum 24 hours after surgery.  Do not drive if you are taking narcotic pain medicine and make sure that your reaction time has returned.   4. You can shower as soon as the next day after surgery. Shower daily. No tub baths or submerging your body in water until cleared by your surgeon. If you have the soap that was given to you by pre-surgical testing that was used before surgery, you do not need to use it afterwards because this can irritate your incisions.   5. No sexual activity and nothing in the vagina for 4-6 weeks (until seen in the office).  6. You may experience vaginal spotting and discharge after surgery.  The spotting is normal but if you experience heavy bleeding, call our office.  7. Take Tylenol or ibuprofen first for pain if you are able to take these medications and only use narcotic pain medication for severe pain not relieved by the Tylenol or Ibuprofen.  Monitor your Tylenol intake to a max of 4,000 mg in a 24 hour period. You can alternate these medications after surgery.  Diet: 1. Low sodium Heart Healthy Diet is recommended but you are cleared to resume your normal (before surgery) diet after your procedure.  2. It is safe to use a laxative, such as Miralax or Colace, if you have difficulty moving your bowels. You have been prescribed Sennakot at bedtime every evening to keep bowel movements regular and to prevent constipation.    Wound Care: 1. Keep clean and dry.  Shower daily.  Reasons to call the Doctor: Fever - Oral temperature greater than 100.4 degrees Fahrenheit Foul-smelling vaginal discharge Difficulty urinating Nausea and vomiting Increased pain at the site of the incision that is unrelieved with pain  medicine. Difficulty breathing with or without chest pain New calf pain especially if only on one side Sudden, continuing increased vaginal bleeding with or without clots.   Contacts: For questions or concerns you should contact:  Dr. Eugene Garnet at [phone]  Warner Mccreedy, NP at [phone]  After Hours: call [phone] and have the GYN Oncologist paged/contacted (after 5 pm or on the weekends).  Messages sent via mychart are for non-urgent matters and are not responded to after hours so for urgent needs, please call the after hours number.

## 2023-06-23 NOTE — Progress Notes (Signed)
Patient here for a pre-operative appointment prior to her scheduled surgery on 06/30/2023. She is scheduled for a cold knife conization of the cervix, endocervical curettage, vaginal laser versus excision, possible biopsies. The surgery was discussed in detail.  See after visit summary for additional details.   Discussed post-op pain management in detail including the aspects of the enhanced recovery pathway.  Advised her that a new prescription would be sent in for Tramadol and it is only to be used for after her upcoming surgery.  We discussed the use of tylenol post-op and to monitor for a maximum of 4,000 mg in a 24 hour period.  Also prescribed sennakot to be used after surgery and to hold if having loose stools.  Discussed bowel regimen in detail.     Discussed the use of SCDs and measures to take at home to prevent DVT including frequent mobility.  Reportable signs and symptoms of DVT discussed. Post-operative instructions discussed and expectations for after surgery. Incisional care discussed as well including reportable signs and symptoms including erythema, drainage, wound separation.     30 minutes spent with the patient.  Verbalizing understanding of material discussed. No needs or concerns voiced at the end of the visit.   Advised patient to call for any needs.  Advised that her post-operative medications had been prescribed and could be picked up at any time.    This appointment is included in the global surgical bundle as pre-operative teaching and has no charge.

## 2023-06-24 ENCOUNTER — Encounter (HOSPITAL_BASED_OUTPATIENT_CLINIC_OR_DEPARTMENT_OTHER): Payer: Self-pay | Admitting: Gynecologic Oncology

## 2023-06-24 NOTE — Progress Notes (Signed)
Spoke w/ via phone for pre-op interview--- pt thru [place] interpreter ID # [phone] Lab needs dos----     istat, ekg          Lab results------ no COVID test -----patient states asymptomatic no test needed Arrive at ------- 0645 on 06-30-2023 NPO after MN NO Solid Food.  Clear liquids from MN until--- 0545 Med rec completed Medications to take morning of surgery ----- none Diabetic medication ----- n/a Patient instructed no nail polish to be worn day of surgery Patient instructed to bring photo id and insurance card day of surgery Patient aware to have Driver (ride ) / caregiver    for 24 hours after surgery --  sister, germani Patient Special Instructions ----- n/a Pre-Op special Instructions ----- pt requested interpreter with no gender preference.  Sent request via email to [place] interpreting, copy w/ chart.  Patient verbalized understanding of instructions that were given at this phone interview. Patient denies shortness of breath, chest pain, fever, cough at this phone interview.

## 2023-06-29 ENCOUNTER — Telehealth: Payer: Self-pay | Admitting: *Deleted

## 2023-06-29 NOTE — Telephone Encounter (Signed)
Spoke with Ms. Campos through [employer] interpreter id [phone] to check on pre-operative status.  Patient compliant with pre-operative instructions.  Reinforced nothing to eat after midnight. Clear liquids until 0530. Patient to arrive at 0630.  No questions or concerns voiced.  Instructed to call for any needs.

## 2023-06-30 ENCOUNTER — Encounter (HOSPITAL_BASED_OUTPATIENT_CLINIC_OR_DEPARTMENT_OTHER): Payer: Self-pay | Admitting: Gynecologic Oncology

## 2023-06-30 ENCOUNTER — Ambulatory Visit (HOSPITAL_BASED_OUTPATIENT_CLINIC_OR_DEPARTMENT_OTHER): Payer: Commercial Managed Care - PPO | Admitting: Anesthesiology

## 2023-06-30 ENCOUNTER — Encounter (HOSPITAL_BASED_OUTPATIENT_CLINIC_OR_DEPARTMENT_OTHER)
Admission: RE | Disposition: A | Discharge status: Home / Self Care | Payer: Self-pay | Source: Home / Self Care | Attending: Gynecologic Oncology

## 2023-06-30 ENCOUNTER — Other Ambulatory Visit: Payer: Self-pay

## 2023-06-30 ENCOUNTER — Ambulatory Visit (HOSPITAL_BASED_OUTPATIENT_CLINIC_OR_DEPARTMENT_OTHER)
Admission: RE | Admit: 2023-06-30 | Discharge: 2023-06-30 | Disposition: A | Discharge status: Home / Self Care | Payer: Commercial Managed Care - PPO | Attending: Gynecologic Oncology | Admitting: Gynecologic Oncology

## 2023-06-30 DIAGNOSIS — N879 Dysplasia of cervix uteri, unspecified: Secondary | ICD-10-CM

## 2023-06-30 DIAGNOSIS — Z780 Asymptomatic menopausal state: Secondary | ICD-10-CM | POA: Diagnosis not present

## 2023-06-30 DIAGNOSIS — N890 Mild vaginal dysplasia: Secondary | ICD-10-CM | POA: Insufficient documentation

## 2023-06-30 DIAGNOSIS — N893 Dysplasia of vagina, unspecified: Secondary | ICD-10-CM

## 2023-06-30 DIAGNOSIS — G709 Myoneural disorder, unspecified: Secondary | ICD-10-CM | POA: Insufficient documentation

## 2023-06-30 DIAGNOSIS — Z01818 Encounter for other preprocedural examination: Secondary | ICD-10-CM

## 2023-06-30 DIAGNOSIS — N870 Mild cervical dysplasia: Secondary | ICD-10-CM | POA: Insufficient documentation

## 2023-06-30 DIAGNOSIS — I10 Essential (primary) hypertension: Secondary | ICD-10-CM | POA: Insufficient documentation

## 2023-06-30 DIAGNOSIS — N871 Moderate cervical dysplasia: Secondary | ICD-10-CM | POA: Diagnosis present

## 2023-06-30 DIAGNOSIS — B977 Papillomavirus as the cause of diseases classified elsewhere: Secondary | ICD-10-CM | POA: Diagnosis not present

## 2023-06-30 DIAGNOSIS — R87810 Cervical high risk human papillomavirus (HPV) DNA test positive: Secondary | ICD-10-CM | POA: Insufficient documentation

## 2023-06-30 HISTORY — DX: Dysplasia of vagina, unspecified: N89.3

## 2023-06-30 HISTORY — DX: Other seasonal allergic rhinitis: J30.2

## 2023-06-30 HISTORY — PX: CERVICAL CONIZATION W/BX: SHX1330

## 2023-06-30 HISTORY — DX: Presence of spectacles and contact lenses: Z97.3

## 2023-06-30 HISTORY — DX: Personal history of colonic polyps: Z86.010

## 2023-06-30 HISTORY — DX: Unspecified macular degeneration: H35.30

## 2023-06-30 HISTORY — PX: VAGINECTOMY, PARTIAL: SHX6846

## 2023-06-30 HISTORY — DX: Personal history of adenomatous and serrated colon polyps: Z86.0101

## 2023-06-30 HISTORY — DX: Personal history of other diseases of the female genital tract: Z87.42

## 2023-06-30 HISTORY — PX: CO2 LASER APPLICATION: SHX5778

## 2023-06-30 HISTORY — DX: Dysplasia of cervix uteri, unspecified: N87.9

## 2023-06-30 LAB — POCT I-STAT, CHEM 8
BUN: 11 mg/dL (ref 6–20)
Calcium, Ion: 1.19 mmol/L (ref 1.15–1.40)
Chloride: 101 mmol/L (ref 98–111)
Creatinine, Ser: 0.5 mg/dL (ref 0.44–1.00)
Glucose, Bld: 100 mg/dL — ABNORMAL HIGH (ref 70–99)
HCT: 43 % (ref 36.0–46.0)
Hemoglobin: 14.6 g/dL (ref 12.0–15.0)
Potassium: 3 mmol/L — ABNORMAL LOW (ref 3.5–5.1)
Sodium: 139 mmol/L (ref 135–145)
TCO2: 22 mmol/L (ref 22–32)

## 2023-06-30 SURGERY — CONE BIOPSY, CERVIX
Anesthesia: General | Site: Vagina

## 2023-06-30 MED ORDER — BUPIVACAINE LIPOSOME 1.3 % IJ SUSP
INTRAMUSCULAR | Status: AC
  Filled 2023-06-30: qty 40

## 2023-06-30 MED ORDER — LIDOCAINE HCL (PF) 0.5 % IJ SOLN
INTRAMUSCULAR | Status: AC
  Filled 2023-06-30: qty 50

## 2023-06-30 MED ORDER — KETOROLAC TROMETHAMINE 30 MG/ML IJ SOLN
INTRAMUSCULAR | Status: AC
  Filled 2023-06-30: qty 1

## 2023-06-30 MED ORDER — PROPOFOL 10 MG/ML IV BOLUS
INTRAVENOUS | Status: AC
  Filled 2023-06-30: qty 20

## 2023-06-30 MED ORDER — LACTATED RINGERS IV SOLN: INTRAVENOUS | Status: DC

## 2023-06-30 MED ORDER — DEXAMETHASONE SODIUM PHOSPHATE 10 MG/ML IJ SOLN
4.0000 mg | INTRAMUSCULAR | Status: AC
  Administered 2023-06-30: 8 mg via INTRAVENOUS

## 2023-06-30 MED ORDER — ACETIC ACID 5 % SOLN
Status: AC
  Filled 2023-06-30: qty 100

## 2023-06-30 MED ORDER — LIDOCAINE HCL (PF) 2 % IJ SOLN
INTRAMUSCULAR | Status: AC
  Filled 2023-06-30: qty 5

## 2023-06-30 MED ORDER — ONDANSETRON HCL 4 MG/2ML IJ SOLN
INTRAMUSCULAR | Status: DC | PRN
  Administered 2023-06-30: 4 mg via INTRAVENOUS

## 2023-06-30 MED ORDER — FENTANYL CITRATE (PF) 100 MCG/2ML IJ SOLN
INTRAMUSCULAR | Status: AC
  Filled 2023-06-30: qty 2

## 2023-06-30 MED ORDER — BUPIVACAINE HCL (PF) 0.25 % IJ SOLN
INTRAMUSCULAR | Status: AC
  Filled 2023-06-30: qty 30

## 2023-06-30 MED ORDER — IODINE STRONG (LUGOLS) 5 % PO SOLN
ORAL | Status: DC | PRN
  Administered 2023-06-30: .2 mL

## 2023-06-30 MED ORDER — MONSELS FERRIC SUBSULFATE EX SOLN
CUTANEOUS | Status: DC | PRN
  Administered 2023-06-30: 1 via TOPICAL

## 2023-06-30 MED ORDER — MONSELS FERRIC SUBSULFATE EX SOLN
CUTANEOUS | Status: AC
  Filled 2023-06-30: qty 16

## 2023-06-30 MED ORDER — ACETIC ACID 5 % SOLN
Status: DC | PRN
  Administered 2023-06-30: 1 via TOPICAL

## 2023-06-30 MED ORDER — MIDAZOLAM HCL 2 MG/2ML IJ SOLN
INTRAMUSCULAR | Status: AC
  Filled 2023-06-30: qty 2

## 2023-06-30 MED ORDER — LIDOCAINE 2% (20 MG/ML) 5 ML SYRINGE
INTRAMUSCULAR | Status: DC | PRN
  Administered 2023-06-30: 80 mg via INTRAVENOUS

## 2023-06-30 MED ORDER — MIDAZOLAM HCL 5 MG/5ML IJ SOLN
INTRAMUSCULAR | Status: DC | PRN
  Administered 2023-06-30: 2 mg via INTRAVENOUS

## 2023-06-30 MED ORDER — PROPOFOL 10 MG/ML IV BOLUS
INTRAVENOUS | Status: DC | PRN
  Administered 2023-06-30: 160 mg via INTRAVENOUS

## 2023-06-30 MED ORDER — KETOROLAC TROMETHAMINE 30 MG/ML IJ SOLN
INTRAMUSCULAR | Status: DC | PRN
  Administered 2023-06-30: 15 mg via INTRAVENOUS

## 2023-06-30 MED ORDER — IODINE STRONG (LUGOLS) 5 % PO SOLN
ORAL | Status: AC
  Filled 2023-06-30: qty 1

## 2023-06-30 MED ORDER — SCOPOLAMINE 1 MG/3DAYS TD PT72
1.0000 | MEDICATED_PATCH | TRANSDERMAL | Status: DC
  Administered 2023-06-30: 1.5 mg via TRANSDERMAL

## 2023-06-30 MED ORDER — DEXAMETHASONE SODIUM PHOSPHATE 10 MG/ML IJ SOLN
INTRAMUSCULAR | Status: AC
  Filled 2023-06-30: qty 1

## 2023-06-30 MED ORDER — LIDOCAINE HCL 1 % IJ SOLN
INTRAMUSCULAR | Status: DC | PRN
  Administered 2023-06-30: 30 mL

## 2023-06-30 MED ORDER — ACETAMINOPHEN 500 MG PO TABS
ORAL_TABLET | ORAL | Status: AC
  Filled 2023-06-30: qty 2

## 2023-06-30 MED ORDER — LIDOCAINE HCL 1 % IJ SOLN
INTRAMUSCULAR | Status: AC
  Filled 2023-06-30: qty 20

## 2023-06-30 MED ORDER — ACETAMINOPHEN 500 MG PO TABS
1000.0000 mg | ORAL_TABLET | ORAL | Status: AC
  Administered 2023-06-30: 1000 mg via ORAL

## 2023-06-30 MED ORDER — ONDANSETRON HCL 4 MG/2ML IJ SOLN
INTRAMUSCULAR | Status: AC
  Filled 2023-06-30: qty 2

## 2023-06-30 MED ORDER — SCOPOLAMINE 1 MG/3DAYS TD PT72
MEDICATED_PATCH | TRANSDERMAL | Status: AC
  Filled 2023-06-30: qty 1

## 2023-06-30 MED ORDER — FENTANYL CITRATE (PF) 100 MCG/2ML IJ SOLN
INTRAMUSCULAR | Status: DC | PRN
  Administered 2023-06-30 (×2): 25 ug via INTRAVENOUS
  Administered 2023-06-30: 50 ug via INTRAVENOUS

## 2023-06-30 MED ORDER — SILVER NITRATE-POT NITRATE 75-25 % EX MISC
CUTANEOUS | Status: AC
  Filled 2023-06-30: qty 10

## 2023-06-30 SURGICAL SUPPLY — 47 items
APL SWBSTK 6 STRL LF DISP (MISCELLANEOUS) ×1
APPLICATOR COTTON TIP 6 STRL (MISCELLANEOUS) ×1 IMPLANT
APPLICATOR COTTON TIP 6IN STRL (MISCELLANEOUS) ×1
BLADE CLIPPER SENSICLIP SURGIC (BLADE) IMPLANT
BLADE EXTENDED COATED 6.5IN (ELECTRODE) ×1 IMPLANT
BLADE SURG 11 STRL SS (BLADE) ×1 IMPLANT
BLADE SURG 15 STRL LF DISP TIS (BLADE) ×1 IMPLANT
BLADE SURG 15 STRL SS (BLADE) ×1
CATH ROBINSON RED A/P 16FR (CATHETERS) ×1 IMPLANT
DEPRESSOR TONGUE 6 IN STERILE (GAUZE/BANDAGES/DRESSINGS) ×1 IMPLANT
DRSG TELFA 3X8 NADH STRL (GAUZE/BANDAGES/DRESSINGS) ×1 IMPLANT
ELECT BALL LEEP 3MM BLK (ELECTRODE) IMPLANT
ELECT BALL LEEP 5MM RED (ELECTRODE) IMPLANT
GAUZE 4X4 16PLY ~~LOC~~+RFID DBL (SPONGE) ×2 IMPLANT
GLOVE BIO SURGEON STRL SZ 6 (GLOVE) ×2 IMPLANT
GOWN STRL REUS W/TWL LRG LVL3 (GOWN DISPOSABLE) ×1 IMPLANT
HEMOSTAT SURGICEL 4X8 (HEMOSTASIS) ×1 IMPLANT
KIT TURNOVER CYSTO (KITS) ×1 IMPLANT
NDL HYPO 25X1 1.5 SAFETY (NEEDLE) ×1 IMPLANT
NEEDLE HYPO 25X1 1.5 SAFETY (NEEDLE) ×1
NS IRRIG 500ML POUR BTL (IV SOLUTION) ×1 IMPLANT
PACK PERINEAL COLD (PAD) ×1 IMPLANT
PACK VAGINAL WOMENS (CUSTOM PROCEDURE TRAY) ×1 IMPLANT
PAD PREP 24X48 CUFFED NSTRL (MISCELLANEOUS) ×1 IMPLANT
PUNCH BIOPSY DERMAL 3 (INSTRUMENTS) IMPLANT
PUNCH BIOPSY DERMAL 3MM (INSTRUMENTS)
PUNCH BIOPSY DERMAL 4MM (INSTRUMENTS) IMPLANT
SLEEVE SCD COMPRESS KNEE MED (STOCKING) ×1 IMPLANT
SPONGE SURGIFOAM ABS GEL 12-7 (HEMOSTASIS) IMPLANT
SUT VIC AB 0 CT1 36 (SUTURE) ×2 IMPLANT
SUT VIC AB 0 SH 27 (SUTURE) ×1 IMPLANT
SUT VIC AB 2-0 CT2 27 (SUTURE) IMPLANT
SUT VIC AB 2-0 SH 27 (SUTURE)
SUT VIC AB 2-0 SH 27XBRD (SUTURE) IMPLANT
SUT VIC AB 2-0 UR6 27 (SUTURE) IMPLANT
SUT VIC AB 3-0 PS2 18 (SUTURE)
SUT VIC AB 3-0 PS2 18XBRD (SUTURE) IMPLANT
SUT VIC AB 3-0 SH 27 (SUTURE) ×1
SUT VIC AB 3-0 SH 27X BRD (SUTURE) ×1 IMPLANT
SUT VIC AB 4-0 PS2 18 (SUTURE) ×1 IMPLANT
SUT VICRYL 0 UR6 27IN ABS (SUTURE) IMPLANT
SWAB OB GYN 8IN STERILE 2PK (MISCELLANEOUS) ×2 IMPLANT
SYR BULB IRRIG 60ML STRL (SYRINGE) ×1 IMPLANT
TOWEL OR 17X24 6PK STRL BLUE (TOWEL DISPOSABLE) ×1 IMPLANT
TUBE CONNECTING 12X1/4 (SUCTIONS) ×1 IMPLANT
VACUUM HOSE 7/8X10 W/ WAND (MISCELLANEOUS) IMPLANT
WATER STERILE IRR 500ML POUR (IV SOLUTION) ×1 IMPLANT

## 2023-06-30 NOTE — Discharge Instructions (Addendum)
AFTER SURGERY INSTRUCTIONS   Return to work: 2 weeks if applicable, based on occupation  You may notice a piece of gauze like-material come out of the vagina over the next several days. This is NORMAL and is used during surgery to help stop bleeding at the cervix.    Activity: 1. Be up and out of the bed during the day.  Take a nap if needed.  You may walk up steps but be careful and use the hand rail.  Stair climbing will tire you more than you think, you may need to stop part way and rest.    2. No lifting or straining for 2 week over 10 pounds. No pushing, pulling, straining for 2 weeks.   3. No driving for minimum 24 hours after surgery.  Do not drive if you are taking narcotic pain medicine and make sure that your reaction time has returned.    4. You can shower as soon as the next day after surgery. Shower daily. No tub baths or submerging your body in water until cleared by your surgeon. If you have the soap that was given to you by pre-surgical testing that was used before surgery, you do not need to use it afterwards because this can irritate your incisions.    5. No sexual activity and nothing in the vagina for 4-6 weeks (until seen in the office).   6. You may experience vaginal spotting and discharge after surgery.  The spotting is normal but if you experience heavy bleeding, call our office.   7. Take Tylenol or ibuprofen first for pain if you are able to take these medications and only use narcotic pain medication for severe pain not relieved by the Tylenol or Ibuprofen.  Monitor your Tylenol intake to a max of 4,000 mg in a 24 hour period. You can alternate these medications after surgery.   Diet: 1. Low sodium Heart Healthy Diet is recommended but you are cleared to resume your normal (before surgery) diet after your procedure.   2. It is safe to use a laxative, such as Miralax or Colace, if you have difficulty moving your bowels. You have been prescribed Sennakot at bedtime  every evening to keep bowel movements regular and to prevent constipation.     Wound Care: 1. Keep clean and dry.  Shower daily.   Reasons to call the Doctor: Fever - Oral temperature greater than 100.4 degrees Fahrenheit Foul-smelling vaginal discharge Difficulty urinating Nausea and vomiting Increased pain at the site of the incision that is unrelieved with pain medicine. Difficulty breathing with or without chest pain New calf pain especially if only on one side Sudden, continuing increased vaginal bleeding with or without clots.   Contacts: For questions or concerns you should contact:   Dr. Eugene Garnet at [phone]   Warner Mccreedy, NP at [phone]   After Hours: call [phone] and have the GYN Oncologist paged/contacted (after 5 pm or on the weekends).   Messages sent via mychart are for non-urgent matters and are not responded to after hours so for urgent needs, please call the after hours number.    Post Anesthesia Home Care Instructions  Activity: Get plenty of rest for the remainder of the day. A responsible individual must stay with you for 24 hours following the procedure.  For the next 24 hours, DO NOT: -Drive a car -Advertising copywriter -Drink alcoholic beverages -Take any medication unless instructed by your physician -Make any legal decisions or sign important papers.  Meals: Start with liquid foods such as gelatin or soup. Progress to regular foods as tolerated. Avoid greasy, spicy, heavy foods. If nausea and/or vomiting occur, drink only clear liquids until the nausea and/or vomiting subsides. Call your physician if vomiting continues.  Special Instructions/Symptoms: Your throat may feel dry or sore from the anesthesia or the breathing tube placed in your throat during surgery. If this causes discomfort, gargle with warm salt water. The discomfort should disappear within 24 hours.  If you had a scopolamine patch placed behind your ear for the  management of post- operative nausea and/or vomiting:  1. The medication in the patch is effective for 72 hours, after which it should be removed.  Wrap patch in a tissue and discard in the trash. Wash hands thoroughly with soap and water. 2. You may remove the patch earlier than 72 hours if you experience unpleasant side effects which may include dry mouth, dizziness or visual disturbances. 3. Avoid touching the patch. Wash your hands with soap and water after contact with the patch.  Do not take any Tylenol until after 12:45 pm today, if needed. Do not take any nonsteroidal anti inflammatories until after 3:30 pm today, if needed.

## 2023-06-30 NOTE — Anesthesia Procedure Notes (Signed)
Procedure Name: LMA Insertion Date/Time: 06/30/2023 9:04 AM  Performed by: Eric Nees D, CRNAPre-anesthesia Checklist: Patient identified, Emergency Drugs available, Suction available and Patient being monitored Patient Re-evaluated:Patient Re-evaluated prior to induction Oxygen Delivery Method: Circle system utilized Preoxygenation: Pre-oxygenation with 100% oxygen Induction Type: IV induction Ventilation: Mask ventilation without difficulty LMA: LMA inserted LMA Size: 4.0 Tube type: Oral Number of attempts: 1 Placement Confirmation: positive ETCO2 and breath sounds checked- equal and bilateral Tube secured with: Tape Dental Injury: Teeth and Oropharynx as per pre-operative assessment

## 2023-06-30 NOTE — Interval H&P Note (Signed)
History and Physical Interval Note:  06/30/2023 7:12 AM  [name]  has presented today for surgery, with the diagnosis of CERVICAL AND VAGINAL DYSPLASIA.  The various methods of treatment have been discussed with the patient and family. After consideration of risks, benefits and other options for treatment, the patient has consented to  Procedure(s): CONIZATION CERVIX WITH BIOPSY, ENDOCERVICAL CURETTAGE (N/A) CO2 LASER TO THE VAGINA (N/A) POSSIBLE VAGINECTOMY,PARTIAL,POSSIBLE BIOSPIES (N/A) as a surgical intervention.  The patient's history has been reviewed, patient examined, no change in status, stable for surgery.  I have reviewed the patient's chart and labs.  Questions were answered to the patient's satisfaction.     Carver Fila

## 2023-06-30 NOTE — Progress Notes (Signed)
All teaching done via hospital interpreter

## 2023-06-30 NOTE — Anesthesia Postprocedure Evaluation (Signed)
Anesthesia Post Note  Patient: [name]  Procedure(s) Performed: CONIZATION CERVIX WITH BIOPSY, ENDOCERVICAL CURETTAGE (Vagina ) CO2 LASER TO THE VAGINA (Vagina ) SMALL VAGINECTOMY, PARTIAL BIOSPIES (Vagina )     Patient location during evaluation: PACU Anesthesia Type: General Level of consciousness: awake and alert Pain management: pain level controlled Vital Signs Assessment: post-procedure vital signs reviewed and stable Respiratory status: spontaneous breathing, nonlabored ventilation, respiratory function stable and patient connected to nasal cannula oxygen Cardiovascular status: blood pressure returned to baseline and stable Postop Assessment: no apparent nausea or vomiting Anesthetic complications: no  No notable events documented.  Last Vitals:  Vitals:   06/30/23 1030 06/30/23 1115  BP: 113/74 114/70  Pulse: 67 74  Resp: 11 16  Temp: 37.1 C (!) 36.4 C  SpO2: 100% 100%    Last Pain:  Vitals:   06/30/23 1115  TempSrc:   PainSc: 0-No pain                 Kennieth Rad

## 2023-06-30 NOTE — Anesthesia Preprocedure Evaluation (Signed)
Anesthesia Evaluation  Patient identified by MRN, date of birth, ID band Patient awake    Reviewed: Allergy & Precautions, NPO status , Patient's Chart, lab work & pertinent test results  Airway Mallampati: II  TM Distance: >3 FB Neck ROM: Full    Dental  (+) Dental Advisory Given   Pulmonary neg pulmonary ROS   breath sounds clear to auscultation       Cardiovascular hypertension, Pt. on medications  Rhythm:Regular Rate:Normal     Neuro/Psych  Headaches  Neuromuscular disease    GI/Hepatic negative GI ROS, Neg liver ROS,,,  Endo/Other  negative endocrine ROS    Renal/GU negative Renal ROS     Musculoskeletal   Abdominal   Peds  Hematology negative hematology ROS (+)   Anesthesia Other Findings   Reproductive/Obstetrics                             Anesthesia Physical Anesthesia Plan  ASA: 2  Anesthesia Plan: General   Post-op Pain Management: Tylenol PO (pre-op)* and Toradol IV (intra-op)*   Induction: Intravenous  PONV Risk Score and Plan: 3 and Dexamethasone, Ondansetron, Midazolam and Treatment may vary due to age or medical condition  Airway Management Planned: LMA  Additional Equipment:   Intra-op Plan:   Post-operative Plan: Extubation in OR  Informed Consent: I have reviewed the patients History and Physical, chart, labs and discussed the procedure including the risks, benefits and alternatives for the proposed anesthesia with the patient or authorized representative who has indicated his/her understanding and acceptance.     Dental advisory given  Plan Discussed with: CRNA  Anesthesia Plan Comments:        Anesthesia Quick Evaluation

## 2023-06-30 NOTE — Transfer of Care (Signed)
Immediate Anesthesia Transfer of Care Note  Patient: [name]  Procedure(s) Performed: CONIZATION CERVIX WITH BIOPSY, ENDOCERVICAL CURETTAGE (Vagina ) CO2 LASER TO THE VAGINA (Vagina ) SMALL VAGINECTOMY, PARTIAL BIOSPIES (Vagina )  Patient Location: PACU  Anesthesia Type:General  Level of Consciousness: awake, alert , and oriented  Airway & Oxygen Therapy: Patient Spontanous Breathing and Patient connected to nasal cannula oxygen  Post-op Assessment: Report given to RN and Post -op Vital signs reviewed and stable  Post vital signs: Reviewed and stable  Last Vitals:  Vitals Value Taken Time  BP 117/68 06/30/23 1002  Temp    Pulse 88 06/30/23 1004  Resp 12 06/30/23 1004  SpO2 100 % 06/30/23 1004  Vitals shown include unfiled device data.  Last Pain:  Vitals:   06/30/23 0702  TempSrc: Oral  PainSc: 0-No pain      Patients Stated Pain Goal: 5 (06/30/23 2440)  Complications: No notable events documented.

## 2023-06-30 NOTE — Op Note (Signed)
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 Mobile uterus, no adnexal masses. After application of Lugols, 2x1 area of decreased uptake at the apex of the right vaginal sidewall (resected) and second more inferior area of mildly decreased uptake along the right sidewall 1x1 cm. No other areas of decreased uptake within the vagina. No decreased uptake on the cervix or visible cervical lesions.   Procedure: The patient was identified in the preoperative holding area. Informed consent was signed on the chart. Patient was seen history was reviewed and exam was performed.   The patient was then taken to the operating room and placed in the supine position with SCD hose on. General anesthesia was then induced without difficulty. She was then placed in the dorsolithotomy position. The perineum was prepped with Hibiclens. The vagina was prepped with Hibiclens. The patient was then draped after the prep was dried.   Timeout was performed the patient, procedure, antibiotic, allergy, and length of procedure. The patient's surgical field was draped with wet towels. The staff and patient ensured laser-safe eyewear and masks were fitted. The laser was set to 8 watts continuous. The laser was tested for accuracy on a tongue  depressor.  Speculum exam was performed with findings noted above. Lugols was applied circumferentially to the vagina and cervix. 1% lidocaine was injected into the subepithelial tissue at planned excision and laser site along the right vagina. Superior area of decreased uptake along the right vaginal sidewall was grasped with an Allis clamp and excised using a scalpel. Hemostasis was achieved with monopolar electrocautery. The laser was applied to the circumscribed area of the vagina.  The tissue was ablated to the desired depth and the eschar was removed with a moistened sponge. When the entire lesion had been ablated, attention was turned to the cervix. All instruments were removed from the vagina.  The weighted speculum was placed in the posterior vagina. The right angle retractor was placed anteriorally to visualize the cervix. A 0-vicryl suture was used to place stay sutures (which were tagged) at 3 and 9 o'clock of the cervicovaginal junction. 10cc of 1% lidocaine was infiltrated into 4 and 8 o'clock of the cervicovaginal junction. The uterine sound was placed in the cervix to delineate the course of the endocervical canal. An 11 blade scalpel on a long knife handle was used to make an incision around the face of the cervix, inside of the stay sutures, circumferentially. A single tooth tenaculum grasped the specimen to manipulate it. The incision was then angled towards the endocervix to amputate the specimen. It was removed, oriented with a marking stitch at the 12 o'clock ectocervix and sent to pathology.   A post-cone ECC was collected from the endocervical canal using a kavorkian currette.  Monopolar electrocautery was used to achieve hemostasis. Monsels solution was applied to the surgical bed to consolidate this  hemostasis followed by surgicel. The stay sutures were tied down and then loosely tied over the cervix to hold the surgicel in place.  The vagina was irrigated.  All instrument,  suture, laparotomy, Ray-Tec, and needle counts were correct x2. The patient tolerated the procedure well and was taken recovery room in stable condition.   Carver Fila, MD

## 2023-07-01 ENCOUNTER — Encounter (HOSPITAL_BASED_OUTPATIENT_CLINIC_OR_DEPARTMENT_OTHER): Payer: Self-pay | Admitting: Gynecologic Oncology

## 2023-07-01 ENCOUNTER — Telehealth: Payer: Self-pay | Admitting: *Deleted

## 2023-07-01 NOTE — Telephone Encounter (Addendum)
Spoke with Ms. [name] this morning through [facility] spanish interpreter id # [phone]. She states she is eating, drinking and urinating well. She has not had a BM yet but is passing gas. She is taking senokot as prescribed and encouraged her to drink plenty of water. She denies fever or chills. Incisions are dry and intact. She rates her pain 1/10. Her pain is controlled with tylenol & ibuprofen.    Instructed to call office with any fever, chills, purulent drainage, uncontrolled pain or any other questions or concerns. Patient verbalizes understanding.   Pt aware of post op appointments as well as the office number [phone] and after hours number [phone] to call if she has any questions or concerns

## 2023-07-01 NOTE — Progress Notes (Signed)
[facility] - Clinic Note  07/07/2023     CHIEF COMPLAINT Patient presents for Retina Follow Up   HISTORY OF PRESENT ILLNESS: [name] is a 52 y.o. female who presents to the clinic today for:  HPI     Retina Follow Up   Diagnosis: CSR.  In right eye.  Severity is moderate.  Duration of 4 weeks.  Since onset it is stable.  I, the attending physician,  performed the HPI with the patient and updated documentation appropriately.        Comments   Patient states vision the same OU.      Last edited by [name], MD on 07/07/2023 11:52 AM.    Pt states   Referring physician: [name], PA-C [address] [place],  [place] [address]  HISTORICAL INFORMATION:   Selected notes from the MEDICAL RECORD NUMBER Referred by Dr. [name] for concern of sub-macular fluid / retinoschisis   CURRENT MEDICATIONS: No current outpatient medications on file. (Ophthalmic Drugs)   No current facility-administered medications for this visit. (Ophthalmic Drugs)   Current Outpatient Medications (Other)  Medication Sig   diphenhydrAMINE HCl (BENADRYL ALLERGY PO) Take by mouth as needed.   eplerenone (INSPRA) 50 MG tablet Take 50 mg by mouth at bedtime.   estradiol (VIVELLE-DOT) 0.05 MG/24HR patch Place 1 patch (0.05 mg total) onto the skin 2 (two) times a week.   fexofenadine (ALLEGRA) 180 MG tablet Take 180 mg by mouth daily as needed for allergies.   hydrochlorothiazide (HYDRODIURIL) 25 MG tablet Take 25 mg by mouth daily.   lisinopril (PRINIVIL,ZESTRIL) 20 MG tablet Take 1 tablet (20 mg total) by mouth daily. (Patient not taking: Reported on 07/07/2023)   progesterone (PROMETRIUM) 100 MG capsule Take 1 capsule (100 mg total) by mouth at bedtime. (Patient not taking: Reported on 06/24/2023)   senna-docusate (SENOKOT-S) 8.6-50 MG tablet Take 2 tablets by mouth at bedtime. For AFTER surgery, do not take if having diarrhea (Patient  not taking: Reported on 06/24/2023)   traMADol (ULTRAM) 50 MG tablet Take 1 tablet (50 mg total) by mouth every 6 (six) hours as needed for severe pain. For AFTER surgery, do not take and drive (Patient not taking: Reported on 06/24/2023)   No current facility-administered medications for this visit. (Other)   REVIEW OF SYSTEMS: ROS   Positive for: Cardiovascular, Eyes Negative for: Constitutional, Gastrointestinal, Neurological, Skin, Genitourinary, Musculoskeletal, HENT, Endocrine, Respiratory, Psychiatric, Allergic/Imm, Heme/Lymph Last edited by Doreene Nest, COT on 07/07/2023  9:38 AM.      ALLERGIES No Known Allergies  PAST MEDICAL HISTORY 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   followed by dr [name];  pt takes  inspra for excess fluid in retina,   Sciatic nerve injury    Seasonal allergies    Vaginal dysplasia    Wears glasses    Past Surgical History:  Procedure Laterality Date   CERVICAL CONIZATION W/BX N/A 06/30/2023   Procedure: CONIZATION CERVIX WITH BIOPSY, ENDOCERVICAL CURETTAGE;  Surgeon: Carver Fila, MD;  Location:  [facility];  Service: Gynecology;  Laterality: N/A;   CO2 LASER APPLICATION N/A 06/30/2023   Procedure: CO2 LASER TO THE VAGINA;  Surgeon: Carver Fila, MD;  Location: [facility];  Service: Gynecology;  Laterality: N/A;   COLONOSCOPY  12/14/2022   HYSTEROSCOPY WITH RESECTOSCOPE  11/25/2006   @[facility]  by dr [name]. [name];   Resectoscopic myomectomy and polypectomy   VAGINECTOMY, PARTIAL N/A 06/30/2023   Procedure: SMALL VAGINECTOMY, PARTIAL BIOSPIES;  Surgeon: Carver Fila, MD;   Location: [facility];  Service: Gynecology;  Laterality: N/A;   FAMILY HISTORY Family History  Problem Relation Age of Onset   Hypertension Mother    Diabetes Father    Diabetes Maternal Grandmother    Hypertension Maternal Grandmother    Colon cancer Neg Hx    Colon polyps Neg Hx    Esophageal cancer Neg Hx    Rectal cancer Neg Hx    Stomach cancer Neg Hx    SOCIAL HISTORY Social History   Tobacco Use   Smoking status: Never   Smokeless tobacco: Never  Vaping Use   Vaping status: Never Used  Substance Use Topics   Alcohol use: No    Alcohol/week: 0.0 standard drinks of alcohol   Drug use: Never       OPHTHALMIC EXAM: Base Eye Exam     Visual Acuity (Snellen - Linear)       Right Left   Dist cc 20/80 -3 20/25 -2   Dist ph cc NI 20/25    Correction: Glasses         Tonometry (Tonopen, 9:44 AM)       Right Left   Pressure 15 13         Pupils       Dark Light Shape React APD   Right 4 2 Round Brisk None   Left 4 2 Round Brisk None         Visual Fields (Counting fingers)       Left Right    Full Full         Extraocular Movement       Right Left    Full, Ortho Full, Ortho         Neuro/Psych     Oriented x3: Yes   Mood/Affect: Normal         Dilation     Both eyes: 1.0% Mydriacyl, 2.5% Phenylephrine @ 9:44 AM           Slit Lamp and Fundus Exam     Slit Lamp Exam       Right Left   Lids/Lashes Dermatochalasis - upper lid Dermatochalasis - upper lid   Conjunctiva/Sclera Injection, nasal pterygium with +vessels 1+ Injection, nasal Pinguecula   Cornea Nasal ptergyium extending 2.79mm onto nasal cornea 1+Punctate epithelial erosions   Anterior Chamber Deep, clear, narrow temporal angle, No cell or flare deep, narrow temporal angle, 1+pigment   Iris Round and dilated Round and dilated   Lens 2+ Nuclear sclerosis, 2+ Cortical cataract 2+ Nuclear sclerosis, 2+ Cortical cataract   Anterior Vitreous Mild  Vitreous syneresis, Posterior vitreous detachment, vitreous condensations Mild Vitreous syneresis         Fundus Exam       Right Left   Disc Pink and Sharp, +cupping Pink and Sharp, +cupping, shallow peripapillary SRF just nasal to disc   C/D Ratio 0.65 0.6   Macula Flat, Blunted foveal reflex, focal SRF -- slightly increased,  RPE mottling, clumping and atrophy, No heme Flat, good foveal reflex, Epiretinal membrane, mild RPE mottling and clumping superiorly, No heme or edema   Vessels attenuated, Tortuous Vascular attenuation, mild Tortuousity   Periphery Attached, bullous Retinoschisis cavity from 0700-0900 -- no associated RT/RD - stable from prior, shallow schisis cavity ST, mild peripheral cystoid degeneration, pigmented cystoid degeneration inferiorly Attached, temporal retinoschisis cavity from 0300-0430 peripherally -- stable from prior -- with good early laser changes posterior and surrounding, pigmented cystoid degeneration inferiorly           Refraction     Wearing Rx       Sphere Cylinder Axis   Right +1.25 +1.25 112   Left +2.25 +1.00 101           IMAGING AND PROCEDURES  Imaging and Procedures for @TODAY @  OCT, Retina - OU - Both Eyes       Right Eye Quality was good. Central Foveal Thickness: 239. Progression has worsened. Findings include normal foveal contour, no IRF, intraretinal hyper-reflective material, subretinal fluid, outer retinal atrophy, vitreomacular adhesion (stable improvement in IRF; mild interval increase in central SRF, +ORA, persistent bullous schisis IT periphery caught on widefield ).   Left Eye Quality was good. Central Foveal Thickness: 300. Progression has been stable. Findings include normal foveal contour, no IRF, no SRF, subretinal fluid, vitreomacular adhesion (Small pocket of SRF nasal disc caught on widefield -- persistent, bullous schisis cavity IT periphery caught on widefield -- not imaged today).   Notes *Images captured  and stored on drive  Diagnosis / Impression:  OD: stable improvement in IRF; mild interval increase in central SRF, +ORA, persistent bullous schisis IT periphery caught on widefield  OS: Small pocket of SRF nasal disc caught on widefield -- persistent, bullous schisis cavity IT periphery caught on widefield  Clinical management:  See below  Abbreviations: NFP - Normal foveal profile. CME - cystoid macular edema. PED - pigment epithelial detachment. IRF - intraretinal fluid. SRF - subretinal fluid. EZ - ellipsoid zone. ERM - epiretinal membrane. ORA - outer retinal atrophy. ORT - outer retinal tubulation. SRHM - subretinal hyper-reflective material      Intravitreal Injection, Pharmacologic Agent - OD - Right Eye       Time Out 07/07/2023. 10:25 AM. Confirmed correct patient, procedure, site, and patient consented.   Anesthesia Topical anesthesia was used. Anesthetic medications included Lidocaine 2%, Proparacaine 0.5%.   Procedure Preparation included 5% betadine to ocular surface, eyelid speculum. A (32g) needle was used.   Injection: 6 mg faricimab-svoa 6 MG/0.05ML   Route: Intravitreal, Site: Right Eye   NDC: O8010301, Lot: [id number], Expiration date: 06/10/2025, Waste: 0 mL   Post-op Post injection exam found visual acuity of at least counting fingers. The patient tolerated the procedure well. There were no complications. The patient received written and verbal post procedure care education. Post injection medications were not given.             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 Exudative age-related macular degeneration of right eye with active choroidal neovascularization (HCC)  H35.3211 OCT, Retina - OU - Both Eyes    Intravitreal Injection, Pharmacologic Agent - OD - Right Eye    faricimab-svoa (VABYSMO) 6mg /0.40mL intravitreal injection    3. Bilateral retinoschisis   H33.103     4. Essential hypertension  I10     5.  Hypertensive retinopathy of both eyes  H35.033     6. Combined forms of age-related cataract of both eyes  H25.813     7. Bilateral retinal defect  H33.303      1,2. CSCR OD -- chronic  - pt reports decreased vision OD since December 2019  - reports significant stressors in life -- work  - denies steroid use - FA (09.30.20) shows focal hyperfluorescent leakage points nasal macula OD -- expansile dot phenotype? - started 50 mg po eplerenone since 09.30.20  -- pt self d/c in February, re-started in June 2021 - s/p IVA OD #1 (09.01.21), #2 (09.29.21), #3 (11.03.21), #4 (12.03.21) -- IVA resistance - s/p IVE OD #1 (01.20.22), #2 (02.18.22), #3 (04.05.22-sample), #4 (05.23.22), # 5 (06.27.22), #6 (07.28.22), #7 (08.26.22), #8 (09.23.22), #9 (10.21.22), #10 (11.18.22), #11 (12.16.22), #12 (01.20.23), #13 (03.02.23), #14 (04.07.23), #15 (05.12.23), #16 (06.09.23), #17 (07.14.23), #18 (08.08.23), #19 (09.08.23), #20 (10.06.23), #21 (11.06.23), #22 (12.04.23), #23 (01.08.24), #24 (02.12.24), #25 (03.18.24), #26 (04.22.24), #27 (05.20.24) -- IVE resistance ============================================================ - s/p IVV OD #1 (07.02.24), #2 (07.30.24), #3 (08.27.24)  - BCVA OD 20/80 -- stable - OCT shows OD: stable improvement in IRF; mild interval increase in central SRF, +ORA, persistent bullous schisis IT periphery caught on widefield; OS: Small pocket of SRF nasal disc caught on widefield -- persistent, bullous schisis cavity IT periphery caught on widefield at 4 wks  - differential includes atypical exudative ARMD - recommend IVV OD #4 today 09.26.24 - RBA of procedure discussed, questions answered - see procedure note - IVV informed consent obtained and signed, 07.02.24  - continue po eplerenone 50 mg daily  - 4 weeks -- DFE/OCT, possible injection  3. Retinoschisis OU (OD > OS)  - bullous retinoschisis cavity IT quadrant OD--  1610-9604 oclock  - bullous retinoschisis cavity IT quadrant OS -- 5409-8119 oclock  - no associated retinal holes/RT/RD on scleral depression   - widefield [employer] imaging (04.22.24) shows mild interval progression of schisis cavities' size and posterior extension in comparison to baseline images  - s/p laser retinopexy OS 08.12.24 -- good early laser changes surrounding - f/u October 7th at 9:45 for laser OD  4,5. Hypertensive retinopathy OU  - discussed importance of tight BP control  - monitor  6. Mixed form age related cataracts OU - The symptoms of cataract, surgical options, and treatments and risks were discussed with patient.  - discussed diagnosis and progression  - monitor  Ophthalmic Meds Ordered this visit:  Meds ordered this encounter  Medications   faricimab-svoa (VABYSMO) 6mg /0.73mL intravitreal injection     Return in about 11 days (around 07/18/2023) for f/u retinoschisis OU, DFE, OCT, possible laser retinopexy OD.  There are no Patient Instructions on file for this visit.  This document serves as a record of services personally performed by Karie Chimera, MD, PhD. It was created on their behalf by Glee Arvin. Manson Passey, OA an ophthalmic technician. The creation of this record is the provider's dictation and/or activities during the visit.    Electronically signed by: Glee Arvin. Manson Passey, OA 07/07/23 11:53 AM   Karie Chimera, M.D., Ph.D. Diseases & Surgery of the Retina and Vitreous [facility]  I have reviewed the above documentation for accuracy and completeness, and I agree with the above. Karie Chimera, M.D., Ph.D. 07/07/23 11:57 AM   Abbreviations: M myopia (nearsighted); A astigmatism; H hyperopia (farsighted); P presbyopia; Mrx spectacle prescription;  CTL contact lenses; OD right eye;  OS left eye; OU both eyes  XT exotropia; ET esotropia; PEK punctate epithelial keratitis; PEE punctate epithelial erosions; DES dry eye syndrome; MGD  meibomian gland dysfunction; ATs artificial tears; PFAT's preservative free artificial tears; NSC nuclear sclerotic cataract; PSC posterior subcapsular cataract; ERM epi-retinal membrane; PVD posterior vitreous detachment; RD retinal detachment; DM diabetes mellitus; DR diabetic retinopathy; NPDR non-proliferative diabetic retinopathy; PDR proliferative diabetic retinopathy; CSME clinically significant macular edema; DME diabetic macular edema; dbh dot blot hemorrhages; CWS cotton wool spot; POAG primary open angle glaucoma; C/D cup-to-disc ratio; HVF humphrey visual field; GVF goldmann visual field; OCT optical coherence tomography; IOP intraocular pressure; BRVO Branch retinal vein occlusion; CRVO central retinal vein occlusion; CRAO central retinal artery occlusion; BRAO branch retinal artery occlusion; RT retinal tear; SB scleral buckle; PPV pars plana vitrectomy; VH Vitreous hemorrhage; PRP panretinal laser photocoagulation; IVK intravitreal kenalog; VMT vitreomacular traction; MH Macular hole;  NVD neovascularization of the disc; NVE neovascularization elsewhere; AREDS age related eye disease study; ARMD age related macular degeneration; POAG primary open angle glaucoma; EBMD epithelial/anterior basement membrane dystrophy; ACIOL anterior chamber intraocular lens; IOL intraocular lens; PCIOL posterior chamber intraocular lens; Phaco/IOL phacoemulsification with intraocular lens placement; PRK photorefractive keratectomy; LASIK laser assisted in situ keratomileusis; HTN hypertension; DM diabetes mellitus; COPD chronic obstructive pulmonary disease

## 2023-07-07 ENCOUNTER — Ambulatory Visit (INDEPENDENT_AMBULATORY_CARE_PROVIDER_SITE_OTHER): Payer: Commercial Managed Care - PPO | Admitting: Ophthalmology

## 2023-07-07 ENCOUNTER — Encounter (INDEPENDENT_AMBULATORY_CARE_PROVIDER_SITE_OTHER): Payer: Self-pay | Admitting: Ophthalmology

## 2023-07-07 DIAGNOSIS — H353211 Exudative age-related macular degeneration, right eye, with active choroidal neovascularization: Secondary | ICD-10-CM

## 2023-07-07 DIAGNOSIS — H33303 Unspecified retinal break, bilateral: Secondary | ICD-10-CM

## 2023-07-07 DIAGNOSIS — H33103 Unspecified retinoschisis, bilateral: Secondary | ICD-10-CM | POA: Diagnosis not present

## 2023-07-07 DIAGNOSIS — H35033 Hypertensive retinopathy, bilateral: Secondary | ICD-10-CM

## 2023-07-07 DIAGNOSIS — H25813 Combined forms of age-related cataract, bilateral: Secondary | ICD-10-CM

## 2023-07-07 DIAGNOSIS — I10 Essential (primary) hypertension: Secondary | ICD-10-CM

## 2023-07-07 DIAGNOSIS — H35711 Central serous chorioretinopathy, right eye: Secondary | ICD-10-CM

## 2023-07-07 MED ORDER — FARICIMAB-SVOA 6 MG/0.05ML IZ SOLN
6.0000 mg | INTRAVITREAL | Status: AC | PRN
Start: 2023-07-07 — End: 2023-07-07
  Administered 2023-07-07: 6 mg via INTRAVITREAL

## 2023-07-14 NOTE — Progress Notes (Signed)
[facility] - Clinic Note  07/18/2023     CHIEF COMPLAINT Patient presents for Retina Follow Up   HISTORY OF PRESENT ILLNESS: [name] is a 52 y.o. female who presents to the clinic today for:  HPI     Retina Follow Up   In right eye.  This started 11 days ago.  Duration of 11 days.  Since onset it is stable.  I, the attending physician,  performed the HPI with the patient and updated documentation appropriately.        Comments   11 day retina follow up serous chorioretinopathy OD and retinopexy OD pt is reporting no vision changes noticed she denies any flashes or floaters       Last edited by [name], MD on 07/18/2023 12:53 PM.    Pt states   Referring physician: [name], PA-C [address] [place],  [place] [address]  HISTORICAL INFORMATION:   Selected notes from the MEDICAL RECORD NUMBER Referred by Dr. [name] for concern of sub-macular fluid / retinoschisis   CURRENT MEDICATIONS: No current outpatient medications on file. (Ophthalmic Drugs)   No current facility-administered medications for this visit. (Ophthalmic Drugs)   Current Outpatient Medications (Other)  Medication Sig   diphenhydrAMINE HCl (BENADRYL ALLERGY PO) Take by mouth as needed.   eplerenone (INSPRA) 50 MG tablet Take 50 mg by mouth at bedtime.   estradiol (VIVELLE-DOT) 0.05 MG/24HR patch Place 1 patch (0.05 mg total) onto the skin 2 (two) times a week.   fexofenadine (ALLEGRA) 180 MG tablet Take 180 mg by mouth daily as needed for allergies.   hydrochlorothiazide (HYDRODIURIL) 25 MG tablet Take 25 mg by mouth daily.   lisinopril (PRINIVIL,ZESTRIL) 20 MG tablet Take 1 tablet (20 mg total) by mouth daily. (Patient not taking: Reported on 07/07/2023)   progesterone (PROMETRIUM) 100 MG capsule Take 1 capsule (100 mg total) by mouth at bedtime. (Patient not taking: Reported on 06/24/2023)   senna-docusate (SENOKOT-S) 8.6-50 MG  tablet Take 2 tablets by mouth at bedtime. For AFTER surgery, do not take if having diarrhea (Patient not taking: Reported on 06/24/2023)   traMADol (ULTRAM) 50 MG tablet Take 1 tablet (50 mg total) by mouth every 6 (six) hours as needed for severe pain. For AFTER surgery, do not take and drive (Patient not taking: Reported on 06/24/2023)   No current facility-administered medications for this visit. (Other)   REVIEW OF SYSTEMS: ROS   Positive for: Cardiovascular, Eyes Negative for: Constitutional, Gastrointestinal, Neurological, Skin, Genitourinary, Musculoskeletal, HENT, Endocrine, Respiratory, Psychiatric, Allergic/Imm, Heme/Lymph Last edited by Etheleen Mayhew, COT on 07/18/2023 10:15 AM.     ALLERGIES No Known Allergies  PAST MEDICAL HISTORY 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   followed by dr [name];  pt takes  inspra for excess fluid in retina,   Sciatic nerve injury    Seasonal allergies    Vaginal dysplasia    Wears glasses    Past Surgical History:  Procedure Laterality Date   CERVICAL CONIZATION W/BX N/A 06/30/2023  Procedure: CONIZATION CERVIX WITH BIOPSY, ENDOCERVICAL CURETTAGE;  Surgeon: Carver Fila, MD;  Location: [facility];  Service: Gynecology;  Laterality: N/A;   CO2 LASER APPLICATION N/A 06/30/2023   Procedure: CO2 LASER TO THE VAGINA;  Surgeon: Carver Fila, MD;  Location: [facility];  Service: Gynecology;  Laterality: N/A;   COLONOSCOPY  12/14/2022   HYSTEROSCOPY WITH RESECTOSCOPE  11/25/2006   @[facility]  by dr [name]. [name];   Resectoscopic myomectomy and polypectomy   VAGINECTOMY,  PARTIAL N/A 06/30/2023   Procedure: SMALL VAGINECTOMY, PARTIAL BIOSPIES;  Surgeon: Carver Fila, MD;  Location: [facility];  Service: Gynecology;  Laterality: N/A;   FAMILY HISTORY Family History  Problem Relation Age of Onset   Hypertension Mother    Diabetes Father    Diabetes Maternal Grandmother    Hypertension Maternal Grandmother    Colon cancer Neg Hx    Colon polyps Neg Hx    Esophageal cancer Neg Hx    Rectal cancer Neg Hx    Stomach cancer Neg Hx    SOCIAL HISTORY Social History   Tobacco Use   Smoking status: Never   Smokeless tobacco: Never  Vaping Use   Vaping status: Never Used  Substance Use Topics   Alcohol use: No    Alcohol/week: 0.0 standard drinks of alcohol   Drug use: Never       OPHTHALMIC EXAM: Base Eye Exam     Visual Acuity (Snellen - Linear)       Right Left   Dist cc 20/100 +1 20/25 -2   Dist ph cc NI NI    Correction: Glasses         Tonometry (Tonopen, 10:19 AM)       Right Left   Pressure 12 14         Pupils       Pupils Dark Light Shape React APD   Right PERRL 4 2 Round Brisk None   Left PERRL 4 2 Round Brisk None         Visual Fields       Left Right    Full Full         Extraocular Movement       Right Left    Full, Ortho Full, Ortho         Neuro/Psych     Oriented x3: Yes   Mood/Affect: Normal         Dilation     Both eyes: 2.5% Phenylephrine @ 10:19 AM           Slit Lamp and Fundus Exam     Slit Lamp Exam       Right Left   Lids/Lashes Dermatochalasis - upper lid Dermatochalasis - upper lid   Conjunctiva/Sclera Injection, nasal pterygium with +vessels 1+ Injection, nasal Pinguecula   Cornea Nasal ptergyium extending 2.21mm onto nasal cornea 1+Punctate epithelial erosions   Anterior Chamber Deep, clear, narrow temporal angle, No cell or flare deep, narrow temporal angle, 1+pigment   Iris Round and dilated Round and dilated   Lens 2+ Nuclear sclerosis,  2+ Cortical cataract 2+ Nuclear sclerosis, 2+ Cortical cataract   Anterior Vitreous Mild Vitreous syneresis, Posterior vitreous detachment, vitreous condensations Mild Vitreous syneresis         Fundus Exam       Right Left   Disc Pink and Sharp, +cupping Pink and Sharp, +cupping, shallow peripapillary SRF just nasal to disc   C/D Ratio  0.65 0.6   Macula Flat, Blunted foveal reflex, focal SRF -- slightly increased, RPE mottling, clumping and atrophy, No heme Flat, good foveal reflex, Epiretinal membrane, mild RPE mottling and clumping superiorly, No heme or edema   Vessels attenuated, Tortuous Vascular attenuation, mild Tortuousity   Periphery Attached, bullous Retinoschisis cavity from 0700-0900 -- no associated RT/RD - stable from prior, shallow schisis cavity ST, mild peripheral cystoid degeneration, pigmented cystoid degeneration inferiorly Attached, temporal retinoschisis cavity from 0300-0430 peripherally -- stable from prior -- with good early laser changes posterior and surrounding, pigmented cystoid degeneration inferiorly           Refraction     Wearing Rx       Sphere Cylinder Axis   Right +1.25 +1.25 112   Left +2.25 +1.00 101           IMAGING AND PROCEDURES  Imaging and Procedures for @TODAY @  OCT, Retina - OU - Both Eyes       Right Eye Quality was good. Central Foveal Thickness: 257. Progression has improved. Findings include normal foveal contour, no IRF, intraretinal hyper-reflective material, subretinal fluid, outer retinal atrophy, vitreomacular adhesion (stable improvement in IRF; mild interval improvement central SRF, +ORA, persistent bullous schisis IT periphery caught on widefield ).   Left Eye Quality was good. Central Foveal Thickness: 307. Progression has been stable. Findings include normal foveal contour, no IRF, no SRF, subretinal fluid, vitreomacular adhesion (Small pocket of SRF nasal disc caught on widefield -- persistent, bullous schisis  cavity IT periphery caught on widefield -- not imaged today).   Notes *Images captured and stored on drive  Diagnosis / Impression:  OD: stable improvement in IRF; mild interval improvement in central SRF, +ORA, persistent bullous schisis IT periphery caught on widefield  OS: Small pocket of SRF nasal disc caught on widefield -- persistent, bullous schisis cavity IT periphery caught on widefield  Clinical management:  See below  Abbreviations: NFP - Normal foveal profile. CME - cystoid macular edema. PED - pigment epithelial detachment. IRF - intraretinal fluid. SRF - subretinal fluid. EZ - ellipsoid zone. ERM - epiretinal membrane. ORA - outer retinal atrophy. ORT - outer retinal tubulation. SRHM - subretinal hyper-reflective material      Repair Retinal Breaks, Laser - OD - Right Eye       LASER PROCEDURE NOTE  Procedure:  Barrier laser retinopexy using slit lamp laser, RIGHT eye   Diagnosis:   Retinoschisis, RIGHT eye                     Bullous schisis cavity spanning [phone], inferotemporal quadrant.   Surgeon: [name], MD, PhD  Anesthesia: Topical  Informed consent obtained, operative eye marked, and time out performed prior to initiation of laser.   Laser settings:  Lumenis Smart532 laser, slit lamp Lens: Mainster PRP 165 Power: 280 mW Spot size: 200 microns Duration: 30 msec  # spots: 585  Placement of laser: Using a Mainster PRP 165 contact lens at the slit lamp, laser was placed in three confluent rows around bullous schisis cavity from 0630 to 0900.  Complications: None.  Patient tolerated the procedure well and received written and verbal post-procedure care information/education.            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 Essential hypertension  I10     5. Hypertensive retinopathy of both eyes  H35.033     6. Combined forms of age-related cataract of both eyes  H25.813      1,2. CSCR OD -- chronic  - pt reports decreased vision OD since December 2019  - reports significant stressors in life -- work  - denies steroid use - FA (09.30.20) shows focal hyperfluorescent leakage points nasal macula OD -- expansile dot phenotype? - started 50 mg po eplerenone since 09.30.20  -- pt self d/c in February, re-started in June 2021 - s/p IVA OD #1 (09.01.21), #2 (09.29.21), #3 (11.03.21), #4 (12.03.21) -- IVA resistance - s/p IVE OD #1 (01.20.22), #2 (02.18.22), #3 (04.05.22-sample), #4 (05.23.22), # 5 (06.27.22), #6 (07.28.22), #7 (08.26.22), #8 (09.23.22), #9 (10.21.22), #10 (11.18.22), #11 (12.16.22), #12 (01.20.23), #13 (03.02.23), #14 (04.07.23), #15 (05.12.23), #16 (06.09.23), #17 (07.14.23), #18 (08.08.23), #19 (09.08.23), #20 (10.06.23), #21 (11.06.23), #22 (12.04.23), #23 (01.08.24), #24 (02.12.24), #25 (03.18.24), #26 (04.22.24), #27 (05.20.24) -- IVE resistance ============================================================ - s/p IVV OD #1 (07.02.24), #2 (07.30.24), #3 (08.27.24), #4 (09.26.24)  - BCVA OD 20/100 from 20/80 - OCT shows OD: stable improvement in IRF; mild interval improvement in central SRF, +ORA, persistent bullous schisis IT periphery caught on widefield; OS: Small pocket of SRF nasal disc caught on widefield -- persistent, bullous schisis cavity IT periphery caught on widefield   - differential includes atypical exudative ARMD - IVV informed consent obtained and signed, 07.02.24  - continue po eplerenone 50 mg daily  - f/u October 24th or later -- DFE/OCT, possible injection  3. Retinoschisis OU (OD > OS)  - bullous retinoschisis cavity IT quadrant OD-- [phone] oclock  - bullous retinoschisis cavity IT  quadrant OS -- 5409-8119 oclock  - no associated retinal holes/RT/RD on scleral depression   - widefield Opt[employer] imaging (04.22.24) shows mild interval progression of schisis cavities' size and posterior extension in comparison to baseline images  - s/p laser retinopexy OS 08.12.24 -- good early laser changes surrounding - recommend laser retinopexy OD today, 10.07.24 - pt wishes to proceed with laser - RBA of procedure discussed, questions answered - informed consent obtained and signed - see procedure note - start PF QID OD x7 days  4,5. Hypertensive retinopathy OU  - discussed importance of tight BP control  - monitor  6. Mixed form age related cataracts OU - The symptoms of cataract, surgical options, and treatments and risks were discussed with patient.  - discussed diagnosis and progression  - monitor  Ophthalmic Meds Ordered this visit:  No orders of the defined types were placed in this encounter.    Return in about 17 days (around 08/04/2023) for f/u October 24th or later, exu ARMD OD, DFE, OCT.  There are no Patient Instructions on file for this visit.  This document serves as a record of services personally performed by Karie Chimera, MD, PhD. It was created on their behalf by Annalee Genta, COMT. The creation of this record is the provider's dictation and/or activities during the visit.  Electronically signed by: Annalee Genta, COMT 07/18/23 12:58 PM  This document serves as a record of services personally performed by Karie Chimera, MD, PhD. It was created on their behalf by Glee Arvin. Manson Passey, OA an ophthalmic technician. The creation of this record is the provider's dictation and/or  activities during the visit.    Electronically signed by: Glee Arvin. Manson Passey, OA 07/18/23 12:58 PM  Karie Chimera, M.D., Ph.D. Diseases & Surgery of the Retina and Vitreous [facility]  I have reviewed the above documentation for accuracy and completeness, and I  agree with the above. Karie Chimera, M.D., Ph.D. 07/18/23 1:01 PM  Abbreviations: M myopia (nearsighted); A astigmatism; H hyperopia (farsighted); P presbyopia; Mrx spectacle prescription;  CTL contact lenses; OD right eye; OS left eye; OU both eyes  XT exotropia; ET esotropia; PEK punctate epithelial keratitis; PEE punctate epithelial erosions; DES dry eye syndrome; MGD meibomian gland dysfunction; ATs artificial tears; PFAT's preservative free artificial tears; NSC nuclear sclerotic cataract; PSC posterior subcapsular cataract; ERM epi-retinal membrane; PVD posterior vitreous detachment; RD retinal detachment; DM diabetes mellitus; DR diabetic retinopathy; NPDR non-proliferative diabetic retinopathy; PDR proliferative diabetic retinopathy; CSME clinically significant macular edema; DME diabetic macular edema; dbh dot blot hemorrhages; CWS cotton wool spot; POAG primary open angle glaucoma; C/D cup-to-disc ratio; HVF humphrey visual field; GVF goldmann visual field; OCT optical coherence tomography; IOP intraocular pressure; BRVO Branch retinal vein occlusion; CRVO central retinal vein occlusion; CRAO central retinal artery occlusion; BRAO branch retinal artery occlusion; RT retinal tear; SB scleral buckle; PPV pars plana vitrectomy; VH Vitreous hemorrhage; PRP panretinal laser photocoagulation; IVK intravitreal kenalog; VMT vitreomacular traction; MH Macular hole;  NVD neovascularization of the disc; NVE neovascularization elsewhere; AREDS age related eye disease study; ARMD age related macular degeneration; POAG primary open angle glaucoma; EBMD epithelial/anterior basement membrane dystrophy; ACIOL anterior chamber intraocular lens; IOL intraocular lens; PCIOL posterior chamber intraocular lens; Phaco/IOL phacoemulsification with intraocular lens placement; PRK photorefractive keratectomy; LASIK laser assisted in situ keratomileusis; HTN hypertension; DM diabetes mellitus; COPD chronic obstructive pulmonary  disease

## 2023-07-18 ENCOUNTER — Encounter (INDEPENDENT_AMBULATORY_CARE_PROVIDER_SITE_OTHER): Payer: Self-pay | Admitting: Ophthalmology

## 2023-07-18 ENCOUNTER — Ambulatory Visit (INDEPENDENT_AMBULATORY_CARE_PROVIDER_SITE_OTHER): Payer: Commercial Managed Care - PPO | Admitting: Ophthalmology

## 2023-07-18 DIAGNOSIS — H353211 Exudative age-related macular degeneration, right eye, with active choroidal neovascularization: Secondary | ICD-10-CM

## 2023-07-18 DIAGNOSIS — H35033 Hypertensive retinopathy, bilateral: Secondary | ICD-10-CM | POA: Diagnosis not present

## 2023-07-18 DIAGNOSIS — I10 Essential (primary) hypertension: Secondary | ICD-10-CM | POA: Diagnosis not present

## 2023-07-18 DIAGNOSIS — H33103 Unspecified retinoschisis, bilateral: Secondary | ICD-10-CM

## 2023-07-18 DIAGNOSIS — H35711 Central serous chorioretinopathy, right eye: Secondary | ICD-10-CM

## 2023-07-18 DIAGNOSIS — H25813 Combined forms of age-related cataract, bilateral: Secondary | ICD-10-CM

## 2023-07-26 ENCOUNTER — Telehealth: Payer: Self-pay

## 2023-07-26 NOTE — Telephone Encounter (Signed)
Pt appointment time on 08/12/23 has been moved from 1:15 to 12:15. Pt agreed to date and time

## 2023-07-26 NOTE — Progress Notes (Addendum)
[facility] - Clinic Note  08/04/2023     CHIEF COMPLAINT Patient presents for Retina Follow Up   HISTORY OF PRESENT ILLNESS: [name] is a 52 y.o. female who presents to the clinic today for:  HPI     Retina Follow Up   Patient presents with  Other.  In right eye.  Severity is moderate.  Duration of 2.5 weeks.  Since onset it is stable.  I, the attending physician,  performed the HPI with the patient and updated documentation appropriately.        Comments   Pt here for 2.5 wk f/u s/p retinopexy OD on 10.07.24. Pt states for the past 5 days in OD she has been seeing FOL w/ intermittent veil/shadow, comes and goes throughout the day.       Last edited by [name], MD on 08/04/2023 10:26 AM.     Pt states since last Weds (10.16.24) she has been seeing floaters and "sparks" in her right eye, she states it also seems like a "blanket" is over her vision  Referring physician: [name], PA-C [address] [place],  [place] [address]  HISTORICAL INFORMATION:   Selected notes from the MEDICAL RECORD NUMBER Referred by Dr. [name] for concern of sub-macular fluid / retinoschisis   CURRENT MEDICATIONS: No current outpatient medications on file. (Ophthalmic Drugs)   No current facility-administered medications for this visit. (Ophthalmic Drugs)   Current Outpatient Medications (Other)  Medication Sig   diphenhydrAMINE HCl (BENADRYL ALLERGY PO) Take by mouth as needed.   eplerenone (INSPRA) 50 MG tablet Take 50 mg by mouth at bedtime.   estradiol (VIVELLE-DOT) 0.05 MG/24HR patch Place 1 patch (0.05 mg total) onto the skin 2 (two) times a week.   fexofenadine (ALLEGRA) 180 MG tablet Take 180 mg by mouth daily as needed for allergies.   hydrochlorothiazide (HYDRODIURIL) 25 MG tablet Take 25 mg by mouth daily.   lisinopril (PRINIVIL,ZESTRIL) 20 MG tablet Take 1 tablet (20 mg total) by mouth daily.    progesterone (PROMETRIUM) 100 MG capsule Take 1 capsule (100 mg total) by mouth at bedtime.   senna-docusate (SENOKOT-S) 8.6-50 MG tablet Take 2 tablets by mouth at bedtime. For AFTER surgery, do not take if having diarrhea (Patient not taking: Reported on 06/24/2023)   traMADol (ULTRAM) 50 MG tablet Take 1 tablet (50 mg total) by mouth every 6 (six) hours as needed for severe pain. For AFTER surgery, do not take and drive (Patient not taking: Reported on 06/24/2023)   No current facility-administered medications for this visit. (Other)   REVIEW OF SYSTEMS: ROS   Positive for: Cardiovascular, Eyes Negative for: Constitutional, Gastrointestinal, Neurological, Skin, Genitourinary, Musculoskeletal, HENT, Endocrine, Respiratory, Psychiatric, Allergic/Imm, Heme/Lymph Last edited by Thompson Grayer, COT on 08/04/2023  8:43 AM.     ALLERGIES No Known Allergies  PAST MEDICAL HISTORY 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   followed by dr [name];  pt takes  inspra for excess fluid in retina,   Sciatic nerve injury  Seasonal allergies    Vaginal dysplasia    Wears glasses    Past Surgical History:  Procedure Laterality Date   CERVICAL CONIZATION W/BX N/A 06/30/2023   Procedure: CONIZATION CERVIX WITH BIOPSY, ENDOCERVICAL CURETTAGE;  Surgeon: Carver Fila, MD;  Location: [facility];  Service: Gynecology;  Laterality: N/A;   CO2 LASER APPLICATION N/A 06/30/2023   Procedure: CO2 LASER TO THE VAGINA;  Surgeon: Carver Fila, MD;  Location: [facility];  Service: Gynecology;  Laterality: N/A;   COLONOSCOPY  12/14/2022    HYSTEROSCOPY WITH RESECTOSCOPE  11/25/2006   @[facility]  by dr [name]. [name];   Resectoscopic myomectomy and polypectomy   VAGINECTOMY, PARTIAL N/A 06/30/2023   Procedure: SMALL VAGINECTOMY, PARTIAL BIOSPIES;  Surgeon: Carver Fila, MD;  Location: [facility];  Service: Gynecology;  Laterality: N/A;   FAMILY HISTORY Family History  Problem Relation Age of Onset   Hypertension Mother    Diabetes Father    Diabetes Maternal Grandmother    Hypertension Maternal Grandmother    Colon cancer Neg Hx    Colon polyps Neg Hx    Esophageal cancer Neg Hx    Rectal cancer Neg Hx    Stomach cancer Neg Hx    SOCIAL HISTORY Social History   Tobacco Use   Smoking status: Never   Smokeless tobacco: Never  Vaping Use   Vaping status: Never Used  Substance Use Topics   Alcohol use: No    Alcohol/week: 0.0 standard drinks of alcohol   Drug use: Never       OPHTHALMIC EXAM: Base Eye Exam     Visual Acuity (Snellen - Linear)       Right Left   Dist cc 20/250 -1 20/20 -2   Dist ph cc NI     Correction: Glasses         Tonometry (Tonopen, 8:48 AM)       Right Left   Pressure 10 12         Pupils       Pupils Dark Light Shape React APD   Right PERRL 4 3 Round Brisk None   Left PERRL 4 3 Round Brisk None         Visual Fields (Counting fingers)       Left Right    Full          Extraocular Movement       Right Left    Full, Ortho Full, Ortho         Neuro/Psych     Oriented x3: Yes   Mood/Affect: Normal         Dilation     Both eyes: 2.5% Phenylephrine, 1.0% Mydriacyl @ 8:49 AM           Slit Lamp and Fundus Exam     Slit Lamp Exam       Right Left   Lids/Lashes Dermatochalasis - upper lid Dermatochalasis - upper lid   Conjunctiva/Sclera Injection, nasal pterygium with +vessels 1+ Injection, nasal Pinguecula   Cornea Nasal ptergyium extending 2.78mm onto nasal cornea 1+Punctate epithelial erosions   Anterior Chamber Deep,  clear, narrow temporal angle, No cell or flare deep, narrow temporal angle, 1+pigment   Iris Round and dilated Round and dilated   Lens 2+ Nuclear sclerosis, 2+ Cortical cataract 2+ Nuclear sclerosis, 2+ Cortical cataract   Anterior Vitreous Mild Vitreous syneresis, Posterior vitreous detachment, vitreous condensations, +RBCs, blood stained vitreous condensations Mild Vitreous syneresis  Fundus Exam       Right Left   Disc Pink and Sharp, +cupping Pink and Sharp, +cupping, shallow peripapillary SRF just nasal to disc   C/D Ratio 0.65 0.6   Macula Flat, Blunted foveal reflex, focal SRF -- slightly increased, RPE mottling, clumping and atrophy, No heme Flat, good foveal reflex, Epiretinal membrane, mild RPE mottling and clumping superiorly, No heme or edema   Vessels attenuated, Tortuous Vascular attenuation, mild Tortuousity   Periphery Attached, bullous Retinoschisis cavity from 0700-0900 -- no associated RT/RD - stable from prior, shallow schisis cavity ST -- good early laser changes surrounding, mild peripheral cystoid degeneration, pigmented cystoid degeneration inferiorly, schisis cavity vs pocket SRF at 1030, no break on scleral depression Attached, temporal retinoschisis cavity from 0300-0430 peripherally -- stable from prior -- with good early laser changes posterior and surrounding, pigmented cystoid degeneration inferiorly           Refraction     Wearing Rx       Sphere Cylinder Axis   Right +1.25 +1.25 112   Left +2.25 +1.00 101           IMAGING AND PROCEDURES  Imaging and Procedures for @TODAY @  OCT, Retina - OU - Both Eyes       Right Eye Quality was good. Central Foveal Thickness: 435. Progression has worsened. Findings include abnormal foveal contour, intraretinal hyper-reflective material, intraretinal fluid, subretinal fluid, outer retinal atrophy, vitreomacular adhesion (Interval re-development of IRF and increased central edema, new vitreous  opacities, +ORA, persistent bullous schisis IT periphery caught on widefield ).   Left Eye Quality was good. Central Foveal Thickness: 302. Progression has been stable. Findings include normal foveal contour, no IRF, no SRF, subretinal fluid, vitreomacular adhesion (Small pocket of SRF nasal disc caught on widefield -- persistent, bullous schisis cavity IT periphery caught on widefield -- not imaged today).   Notes *Images captured and stored on drive  Diagnosis / Impression:  OD: Interval re-development of IRF and increased central edema, new vitreous opacities, +ORA, persistent bullous schisis IT periphery caught on widefield  OS: Small pocket of SRF nasal disc caught on widefield -- persistent, bullous schisis cavity IT periphery caught on widefield  Clinical management:  See below  Abbreviations: NFP - Normal foveal profile. CME - cystoid macular edema. PED - pigment epithelial detachment. IRF - intraretinal fluid. SRF - subretinal fluid. EZ - ellipsoid zone. ERM - epiretinal membrane. ORA - outer retinal atrophy. ORT - outer retinal tubulation. SRHM - subretinal hyper-reflective material      Intravitreal Injection, Pharmacologic Agent - OD - Right Eye       Time Out 08/04/2023. 9:23 AM. Confirmed correct patient, procedure, site, and patient consented.   Anesthesia Topical anesthesia was used. Anesthetic medications included Lidocaine 2%, Proparacaine 0.5%.   Procedure Preparation included 5% betadine to ocular surface, eyelid speculum. A (32g) needle was used.   Injection: 6 mg faricimab-svoa 6 MG/0.05ML   Route: Intravitreal, Site: Right Eye   NDC: [id number], Lot: [id number], Expiration date: 01/08/2025, Waste: 0 mL   Post-op Post injection exam found visual acuity of at least counting fingers. The patient tolerated the procedure well. There were no complications. The patient received written and verbal post procedure care education. Post injection medications were not  given.            ASSESSMENT/PLAN:    ICD-10-CM   1. Vitreous hemorrhage of right eye (HCC)  H43.11     2. 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 Essential hypertension  I10     6. Hypertensive retinopathy of both eyes  H35.033     7. Combined forms of age-related cataract of both eyes  H25.813      Vitreous hemorrhage OD - new onset since Wednesday, 10.16.24, by pt history - pt seeing new floaters and "sparks" in vision - BCVA 20/250 from 20/100 - exam shows white, blood stained vit condensations -- mobile - recommend IVV OD #5 as below - VH precautions reviewed -- minimize activities, keep head elevated, avoid ASA/NSAIDs/blood thinners as able - f/u Monday, October 28th  2,3. CSCR OD -- chronic  - pt reports decreased vision OD since December 2019  - reports significant stressors in life -- work  - denies steroid use - FA (09.30.20) shows focal hyperfluorescent leakage points nasal macula OD -- expansile dot phenotype? - started 50 mg po eplerenone since 09.30.20  -- pt self d/c in February, re-started in June 2021 - s/p IVA OD #1 (09.01.21), #2 (09.29.21), #3 (11.03.21), #4 (12.03.21) -- IVA resistance - s/p IVE OD #1 (01.20.22), #2 (02.18.22), #3 (04.05.22-sample), #4 (05.23.22), # 5 (06.27.22), #6 (07.28.22), #7 (08.26.22), #8 (09.23.22), #9 (10.21.22), #10 (11.18.22), #11 (12.16.22), #12 (01.20.23), #13 (03.02.23), #14 (04.07.23), #15 (05.12.23), #16 (06.09.23), #17 (07.14.23), #18 (08.08.23), #19 (09.08.23), #20 (10.06.23), #21 (11.06.23), #22 (12.04.23), #23 (01.08.24), #24 (02.12.24), #25 (03.18.24), #26 (04.22.24), #27 (05.20.24) -- IVE  resistance ============================================================ - s/p IVV OD #1 (07.02.24), #2 (07.30.24), #3 (08.27.24), #4 (09.26.24)  - BCVA OD 20/250 from 20/100 -- new onset VH - OCT shows OD: Interval re-development of IRF and increased central edema, new vitreous opacities, +ORA, persistent bullous schisis IT periphery caught on widefield; OS: Small pocket of SRF nasal disc caught on widefield -- persistent, bullous schisis cavity IT periphery caught on widefield  - differential includes atypical exudative ARMD  - recommend IVV OD #5 today, 10.24.24  - pt wishes to proceed with injection  - RBA of procedure discussed, questions answered - see procedure note - IVV informed consent obtained and signed, 07.02.24  - continue po eplerenone 50 mg daily  - f/u Monday, October 28th-- DFE/OCT  4. Retinoschisis OU (OD > OS)  - bullous retinoschisis cavity IT quadrant OD-- 1610-9604 oclock  - bullous retinoschisis cavity IT quadrant OS -- 5409-8119 oclock  - no associated retinal holes/RT/RD on scleral depression   - widefield [employer] imaging (04.22.24) shows mild interval progression of schisis cavities' size and posterior extension in comparison to baseline images  - s/p laser retinopexy OS 08.12.24 -- good laser changes surrounding - s/p laser retinopexy OD (10.07.24) -- good early laser changes surrounidng - new schisis cavity found on examination OD - 1030 periphery -- no RT/RD on scleral depressed exam - monitor - f/u Monday 10.28.24 - DFE/OCT, possible laser to new schisis cavity OD  5,6. Hypertensive retinopathy OU  - discussed importance of tight BP control  - monitor  7. Mixed form age related cataracts OU - The symptoms of cataract, surgical options, and treatments and risks were discussed with patient.  - discussed diagnosis and progression  - monitor  Ophthalmic Meds Ordered this visit:  Meds ordered  this encounter  Medications   faricimab-svoa (VABYSMO) 6mg /0.7mL  intravitreal injection     Return in about 4 days (around 08/08/2023) for f/u VH OD, DFE, OCT.  There are no Patient Instructions on file for this visit.  This document serves as a record of services personally performed by Karie Chimera, MD, PhD. It was created on their behalf by Glee Arvin. Manson Passey, OA an ophthalmic technician. The creation of this record is the provider's dictation and/or activities during the visit.    Electronically signed by: Glee Arvin. Manson Passey, OA 08/04/23 10:31 AM  Karie Chimera, M.D., Ph.D. Diseases & Surgery of the Retina and Vitreous [facility]  I have reviewed the above documentation for accuracy and completeness, and I agree with the above. Karie Chimera, M.D., Ph.D. 08/04/23 10:31 AM   Abbreviations: M myopia (nearsighted); A astigmatism; H hyperopia (farsighted); P presbyopia; Mrx spectacle prescription;  CTL contact lenses; OD right eye; OS left eye; OU both eyes  XT exotropia; ET esotropia; PEK punctate epithelial keratitis; PEE punctate epithelial erosions; DES dry eye syndrome; MGD meibomian gland dysfunction; ATs artificial tears; PFAT's preservative free artificial tears; NSC nuclear sclerotic cataract; PSC posterior subcapsular cataract; ERM epi-retinal membrane; PVD posterior vitreous detachment; RD retinal detachment; DM diabetes mellitus; DR diabetic retinopathy; NPDR non-proliferative diabetic retinopathy; PDR proliferative diabetic retinopathy; CSME clinically significant macular edema; DME diabetic macular edema; dbh dot blot hemorrhages; CWS cotton wool spot; POAG primary open angle glaucoma; C/D cup-to-disc ratio; HVF humphrey visual field; GVF goldmann visual field; OCT optical coherence tomography; IOP intraocular pressure; BRVO Branch retinal vein occlusion; CRVO central retinal vein occlusion; CRAO central retinal artery occlusion; BRAO branch retinal artery occlusion; RT retinal tear; SB scleral buckle; PPV pars plana  vitrectomy; VH Vitreous hemorrhage; PRP panretinal laser photocoagulation; IVK intravitreal kenalog; VMT vitreomacular traction; MH Macular hole;  NVD neovascularization of the disc; NVE neovascularization elsewhere; AREDS age related eye disease study; ARMD age related macular degeneration; POAG primary open angle glaucoma; EBMD epithelial/anterior basement membrane dystrophy; ACIOL anterior chamber intraocular lens; IOL intraocular lens; PCIOL posterior chamber intraocular lens; Phaco/IOL phacoemulsification with intraocular lens placement; PRK photorefractive keratectomy; LASIK laser assisted in situ keratomileusis; HTN hypertension; DM diabetes mellitus; COPD chronic obstructive pulmonary disease

## 2023-08-04 ENCOUNTER — Encounter (INDEPENDENT_AMBULATORY_CARE_PROVIDER_SITE_OTHER): Payer: Self-pay | Admitting: Ophthalmology

## 2023-08-04 ENCOUNTER — Ambulatory Visit (INDEPENDENT_AMBULATORY_CARE_PROVIDER_SITE_OTHER): Payer: Commercial Managed Care - PPO | Admitting: Ophthalmology

## 2023-08-04 DIAGNOSIS — H353211 Exudative age-related macular degeneration, right eye, with active choroidal neovascularization: Secondary | ICD-10-CM

## 2023-08-04 DIAGNOSIS — H25813 Combined forms of age-related cataract, bilateral: Secondary | ICD-10-CM

## 2023-08-04 DIAGNOSIS — H35711 Central serous chorioretinopathy, right eye: Secondary | ICD-10-CM | POA: Diagnosis not present

## 2023-08-04 DIAGNOSIS — H33103 Unspecified retinoschisis, bilateral: Secondary | ICD-10-CM

## 2023-08-04 DIAGNOSIS — H33303 Unspecified retinal break, bilateral: Secondary | ICD-10-CM

## 2023-08-04 DIAGNOSIS — H4311 Vitreous hemorrhage, right eye: Secondary | ICD-10-CM | POA: Diagnosis not present

## 2023-08-04 DIAGNOSIS — H35033 Hypertensive retinopathy, bilateral: Secondary | ICD-10-CM

## 2023-08-04 DIAGNOSIS — I10 Essential (primary) hypertension: Secondary | ICD-10-CM

## 2023-08-04 MED ORDER — FARICIMAB-SVOA 6 MG/0.05ML IZ SOSY
6.0000 mg | PREFILLED_SYRINGE | INTRAVITREAL | Status: AC | PRN
Start: 2023-08-04 — End: 2023-08-04
  Administered 2023-08-04: 6 mg via INTRAVITREAL

## 2023-08-05 NOTE — Progress Notes (Signed)
[facility] - Clinic Note  08/08/2023     CHIEF COMPLAINT Patient presents for Retina Follow Up   HISTORY OF PRESENT ILLNESS: [name] is a 52 y.o. female who presents to the clinic today for:  HPI     Retina Follow Up   In right eye.  This started 4 days ago.  Duration of 4 days.  Since onset it is stable.  I, the attending physician,  performed the HPI with the patient and updated documentation appropriately.        Comments   4 day retina follow up VH OD pt is reporting no vision changes seems about the same she is having a film over her right eye she denies any flashes or floaters       Last edited by [name], MD on 08/08/2023  3:47 PM.    Pt states her vision is still blurry today, she is sleeping with her head elevated  Referring physician: [name], PA-C [address] [place],  [place] [address]  HISTORICAL INFORMATION:   Selected notes from the MEDICAL RECORD NUMBER Referred by Dr. [name] for concern of sub-macular fluid / retinoschisis   CURRENT MEDICATIONS: Current Outpatient Medications (Ophthalmic Drugs)  Medication Sig   prednisoLONE acetate (PRED FORTE) 1 % ophthalmic suspension Place 1 drop into the right eye 4 (four) times daily for 7 days.   No current facility-administered medications for this visit. (Ophthalmic Drugs)   Current Outpatient Medications (Other)  Medication Sig   diphenhydrAMINE HCl (BENADRYL ALLERGY PO) Take by mouth as needed.   eplerenone (INSPRA) 50 MG tablet Take 50 mg by mouth at bedtime.   estradiol (VIVELLE-DOT) 0.05 MG/24HR patch Place 1 patch (0.05 mg total) onto the skin 2 (two) times a week.   fexofenadine (ALLEGRA) 180 MG tablet Take 180 mg by mouth daily as needed for allergies.   hydrochlorothiazide (HYDRODIURIL) 25 MG tablet Take 25 mg by mouth daily.   lisinopril (PRINIVIL,ZESTRIL) 20 MG tablet Take 1 tablet (20 mg total) by mouth daily.    progesterone (PROMETRIUM) 100 MG capsule Take 1 capsule (100 mg total) by mouth at bedtime.   senna-docusate (SENOKOT-S) 8.6-50 MG tablet Take 2 tablets by mouth at bedtime. For AFTER surgery, do not take if having diarrhea (Patient not taking: Reported on 06/24/2023)   traMADol (ULTRAM) 50 MG tablet Take 1 tablet (50 mg total) by mouth every 6 (six) hours as needed for severe pain. For AFTER surgery, do not take and drive (Patient not taking: Reported on 06/24/2023)   No current facility-administered medications for this visit. (Other)   REVIEW OF SYSTEMS: ROS   Positive for: Cardiovascular, Eyes Negative for: Constitutional, Gastrointestinal, Neurological, Skin, Genitourinary, Musculoskeletal, HENT, Endocrine, Respiratory, Psychiatric, Allergic/Imm, Heme/Lymph Last edited by Etheleen Mayhew, COT on 08/08/2023  1:53 PM.      ALLERGIES No Known Allergies  PAST MEDICAL HISTORY 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   followed by dr [name];  pt takes  inspra for excess fluid in retina,   Sciatic  nerve injury    Seasonal allergies    Vaginal dysplasia    Wears glasses    Past Surgical History:  Procedure Laterality Date   CERVICAL CONIZATION W/BX N/A 06/30/2023   Procedure: CONIZATION CERVIX WITH BIOPSY, ENDOCERVICAL CURETTAGE;  Surgeon: Carver Fila, MD;  Location: [facility];  Service: Gynecology;  Laterality: N/A;   CO2 LASER APPLICATION N/A 06/30/2023   Procedure: CO2 LASER TO THE VAGINA;  Surgeon: Carver Fila, MD;  Location[facility];  Service: Gynecology;  Laterality: N/A;   COLONOSCOPY  12/14/2022    HYSTEROSCOPY WITH RESECTOSCOPE  11/25/2006   @[facility]  by dr [name]. [name];   Resectoscopic myomectomy and polypectomy   VAGINECTOMY, PARTIAL N/A 06/30/2023   Procedure: SMALL VAGINECTOMY, PARTIAL BIOSPIES;  Surgeon: Carver Fila, MD;  Location: [facility];  Service: Gynecology;  Laterality: N/A;   FAMILY HISTORY Family History  Problem Relation Age of Onset   Hypertension Mother    Diabetes Father    Diabetes Maternal Grandmother    Hypertension Maternal Grandmother    Colon cancer Neg Hx    Colon polyps Neg Hx    Esophageal cancer Neg Hx    Rectal cancer Neg Hx    Stomach cancer Neg Hx    SOCIAL HISTORY Social History   Tobacco Use   Smoking status: Never   Smokeless tobacco: Never  Vaping Use   Vaping status: Never Used  Substance Use Topics   Alcohol use: No    Alcohol/week: 0.0 standard drinks of alcohol   Drug use: Never       OPHTHALMIC EXAM: Base Eye Exam     Visual Acuity (Snellen - Linear)       Right Left   Dist cc CF at 3' 20/25 -1   Dist ph cc NI NI    Correction: Glasses         Tonometry (Tonopen, 1:57 PM)       Right Left   Pressure 12 14         Pupils       Pupils Dark Light Shape React APD   Right PERRL 4 3 Round Brisk None   Left PERRL 4 3 Round Brisk None         Visual Fields       Left Right    Full Full         Extraocular Movement       Right Left    Full, Ortho Full, Ortho         Neuro/Psych     Oriented x3: Yes   Mood/Affect: Normal         Dilation     Both eyes: 2.5% Phenylephrine @ 1:57 PM           Slit Lamp and Fundus Exam     Slit Lamp Exam       Right Left   Lids/Lashes Dermatochalasis - upper lid Dermatochalasis - upper lid   Conjunctiva/Sclera Injection, nasal pterygium with +vessels 1+ Injection, nasal Pinguecula   Cornea Nasal ptergyium extending 2.55mm onto nasal cornea 1+Punctate epithelial erosions   Anterior Chamber Deep, clear, narrow temporal angle,  No cell or flare deep, narrow temporal angle, 1+pigment   Iris Round and dilated Round and dilated   Lens 2+ Nuclear sclerosis, 2+ Cortical cataract 2+ Nuclear sclerosis, 2+ Cortical cataract   Anterior Vitreous Mild Vitreous syneresis, Posterior vitreous detachment, vitreous condensations -- slightly improved, +RBCs, blood stained  vitreous condensations turning white Mild Vitreous syneresis         Fundus Exam       Right Left   Disc Pink and Sharp, +cupping Pink and Sharp, +cupping, shallow peripapillary SRF just nasal to disc   C/D Ratio 0.65 0.6   Macula Flat, Blunted foveal reflex, focal SRF -- slightly increased, RPE mottling, clumping and atrophy, No heme Flat, good foveal reflex, Epiretinal membrane, mild RPE mottling and clumping superiorly, No heme or edema   Vessels attenuated, Tortuous Vascular attenuation, mild Tortuousity   Periphery Attached, bullous Retinoschisis cavity from 0700-0900 -- no associated RT/RD - good early laser changes surrounding; new focal schisis cavity ST (1030) -- no RT/RD on scleral depression, mild peripheral cystoid degeneration, pigmented cystoid degeneration inferiorly Attached, temporal retinoschisis cavity from 0300-0430 peripherally -- stable from prior -- with good early laser changes posterior and surrounding, pigmented cystoid degeneration inferiorly           Refraction     Wearing Rx       Sphere Cylinder Axis   Right +1.25 +1.25 112   Left +2.25 +1.00 101           IMAGING AND PROCEDURES  Imaging and Procedures for @TODAY @  OCT, Retina - OU - Both Eyes       Right Eye Quality was good. Central Foveal Thickness: 275. Progression has improved. Findings include abnormal foveal contour, intraretinal hyper-reflective material, intraretinal fluid, subretinal fluid, outer retinal atrophy, vitreomacular adhesion (Interval improvement in central IRF and edema, new vitreous opacities -- improved, +ORA, persistent bullous schisis IT  periphery caught on widefield ).   Left Eye Quality was good. Central Foveal Thickness: 304. Progression has been stable. Findings include normal foveal contour, no IRF, no SRF, subretinal fluid, vitreomacular adhesion (Small pocket of SRF nasal disc caught on widefield -- persistent, bullous schisis cavity IT periphery caught on widefield -- not imaged today).   Notes *Images captured and stored on drive  Diagnosis / Impression:  OD: Interval improvement in central IRF and edema; new vitreous opacities -- improved, +ORA, persistent bullous schisis IT periphery caught on widefield  OS: Small pocket of SRF nasal disc caught on widefield -- persistent, bullous schisis cavity IT periphery caught on widefield  Clinical management:  See below  Abbreviations: NFP - Normal foveal profile. CME - cystoid macular edema. PED - pigment epithelial detachment. IRF - intraretinal fluid. SRF - subretinal fluid. EZ - ellipsoid zone. ERM - epiretinal membrane. ORA - outer retinal atrophy. ORT - outer retinal tubulation. SRHM - subretinal hyper-reflective material      Repair Retinal Breaks, Laser - OD - Right Eye       LASER PROCEDURE NOTE  Procedure:  Barrier laser retinopexy using slit lamp laser, RIGHT eye   Diagnosis:   Retinoschisis, RIGHT eye                     New focal bullous schisis cavity at 1030, superotemporal quadrant.   Surgeon: [name], MD, PhD  Anesthesia: Topical  Informed consent obtained, operative eye marked, and time out performed prior to initiation of laser.   Laser settings:  Lumenis Smart532 laser, slit lamp Lens: Mainster PRP 165 Power: 300 mW Spot size: 200 microns Duration: 30 msec  # spots: 382  Placement of laser: Using a Mainster PRP 165 contact lens at the slit lamp, laser was placed in three confluent rows around focal bullous schisis cavity at 1030.  Complications: None.  Patient tolerated the procedure well and received written and verbal  post-procedure care information/education.           ASSESSMENT/PLAN:    ICD-10-CM   1. Vitreous hemorrhage of right eye (HCC)  H43.11 OCT, Retina - OU - Both Eyes    2. 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 Exudative age-related macular degeneration of right eye with active choroidal neovascularization (HCC)  H35.3211 OCT, Retina - OU - Both Eyes    4. 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 Essential hypertension  I10     6. Hypertensive retinopathy of both eyes  H35.033     7. Combined forms of age-related cataract of both eyes  H25.813     8. Right retinal defect  H33.301 Repair Retinal Breaks, Laser - OD - Right Eye      Vitreous hemorrhage OD - new onset since Wednesday, 10.16.24, by pt history - pt seeing new floaters and "sparks" in vision - BCVA CF from 20/250 - exam shows white, blood stained vit condensations -- mobile - VH precautions reviewed -- minimize activities, keep head elevated, avoid ASA/NSAIDs/blood thinners as able - f/u 1-2 weeks, DFE, OCT  2,3. CSCR OD -- chronic  - pt reports decreased vision OD since December 2019  - reports significant stressors in life -- work  - denies steroid use - FA (09.30.20) shows focal hyperfluorescent leakage points nasal macula OD -- expansile dot phenotype? - started 50 mg po eplerenone since 09.30.20  -- pt self d/c in February, re-started in June 2021 - s/p IVA OD #1 (09.01.21), #2 (09.29.21), #3 (11.03.21), #4 (12.03.21) -- IVA resistance - s/p IVE OD #1 (01.20.22), #2 (02.18.22), #3 (04.05.22-sample), #4 (05.23.22), # 5 (06.27.22), #6 (07.28.22), #7 (08.26.22), #8 (09.23.22), #9 (10.21.22), #10 (11.18.22), #11 (12.16.22), #12 (01.20.23), #13 (03.02.23), #14 (04.07.23), #15 (05.12.23), #16 (06.09.23), #17 (07.14.23), #18 (08.08.23), #19 (09.08.23), #20 (10.06.23), #21 (11.06.23), #22 (12.04.23), #23 (01.08.24), #24  (02.12.24), #25 (03.18.24), #26 (04.22.24), #27 (05.20.24) -- IVE resistance ============================================================ - s/p IVV OD #1 (07.02.24), #2 (07.30.24), #3 (08.27.24), #4 (09.26.24), #5 (10.24.24)  - BCVA OD CF from 20/250 -- new onset VH - OCT shows Interval improvement in central IRF and edema, new vitreous opacities -- improved, +ORA, persistent bullous schisis IT periphery caught on widefield   - differential includes atypical exudative ARMD - IVV informed consent obtained and signed, 07.02.24  - continue po eplerenone 50 mg daily  - f/u 1-2 weeks, DFE, OCT  4. Retinoschisis OU (OD > OS)  - bullous retinoschisis cavity IT quadrant OD-- 6160-7371 oclock  - bullous retinoschisis cavity IT quadrant OS -- 0626-9485 oclock  - no associated retinal holes/RT/RD on scleral depression   - widefield [employer] imaging (04.22.24) shows mild interval progression of schisis cavities' size and posterior extension in comparison to baseline images  - s/p laser retinopexy OS 08.12.24 -- good laser changes surrounding - s/p laser retinopexy OD (10.07.24) -- good early laser changes surrounidng - new schisis cavity found on examination OD - 1030 periphery -- no RT/RD on scleral depressed exam - recommend laser retinopexy OD to new schisis at 1030 - pt wishes to proceed with laser - RBA of procedure discussed, questions answered - informed consent obtained and signed - see procedure note - start PF QID OD x7 days - f/u 1-2 weeks - DFE/OCT  5,6. Hypertensive retinopathy OU  - discussed importance of tight BP control  - monitor  7. Mixed form age related cataracts OU - The symptoms of cataract, surgical options, and treatments and risks were discussed with patient.  - discussed diagnosis and progression  - monitor  Ophthalmic Meds Ordered this visit:  Meds ordered this encounter  Medications   prednisoLONE acetate (PRED FORTE) 1 % ophthalmic suspension    Sig: Place 1 drop  into the right eye 4 (four) times daily for 7 days.    Dispense:  5 mL    Refill:  0     Return for f/u 1-2 weeks, schisis OD, DFE, OCT.  There are no Patient Instructions on file for this visit.  This document serves as a record of services personally performed by Karie Chimera, MD, PhD. It was created on their behalf by Annalee Genta, COMT. The creation of this record is the provider's dictation and/or activities during the visit.  Electronically signed by: Annalee Genta, COMT 08/08/23 4:13 PM  This document serves as a record of services personally performed by Karie Chimera, MD, PhD. It was created on their behalf by Glee Arvin. Manson Passey, OA an ophthalmic technician. The creation of this record is the provider's dictation and/or activities during the visit.    Electronically signed by: Glee Arvin. Manson Passey, OA 08/08/23 4:13 PM  Karie Chimera, M.D., Ph.D. Diseases & Surgery of the Retina and Vitreous [facility]  I have reviewed the above documentation for accuracy and completeness, and I agree with the above. Karie Chimera, M.D., Ph.D. 08/08/23 4:14 PM  Abbreviations: M myopia (nearsighted); A astigmatism; H hyperopia (farsighted); P presbyopia; Mrx spectacle prescription;  CTL contact lenses; OD right eye; OS left eye; OU both eyes  XT exotropia; ET esotropia; PEK punctate epithelial keratitis; PEE punctate epithelial erosions; DES dry eye syndrome; MGD meibomian gland dysfunction; ATs artificial tears; PFAT's preservative free artificial tears; NSC nuclear sclerotic cataract; PSC posterior subcapsular cataract; ERM epi-retinal membrane; PVD posterior vitreous detachment; RD retinal detachment; DM diabetes mellitus; DR diabetic retinopathy; NPDR non-proliferative diabetic retinopathy; PDR proliferative diabetic retinopathy; CSME clinically significant macular edema; DME diabetic macular edema; dbh dot blot hemorrhages; CWS cotton wool spot; POAG primary open angle  glaucoma; C/D cup-to-disc ratio; HVF humphrey visual field; GVF goldmann visual field; OCT optical coherence tomography; IOP intraocular pressure; BRVO Branch retinal vein occlusion; CRVO central retinal vein occlusion; CRAO central retinal artery occlusion; BRAO branch retinal artery occlusion; RT retinal tear; SB scleral buckle; PPV pars plana vitrectomy; VH Vitreous hemorrhage; PRP panretinal laser photocoagulation; IVK intravitreal kenalog; VMT vitreomacular traction; MH Macular hole;  NVD neovascularization of the disc; NVE neovascularization elsewhere; AREDS age related eye disease study; ARMD age related macular degeneration; POAG primary open angle glaucoma; EBMD epithelial/anterior basement membrane dystrophy; ACIOL anterior chamber intraocular lens; IOL intraocular lens; PCIOL posterior chamber intraocular lens; Phaco/IOL phacoemulsification with intraocular lens placement; PRK photorefractive keratectomy; LASIK laser assisted in situ keratomileusis; HTN hypertension; DM diabetes mellitus; COPD chronic obstructive pulmonary disease

## 2023-08-08 ENCOUNTER — Ambulatory Visit (INDEPENDENT_AMBULATORY_CARE_PROVIDER_SITE_OTHER): Payer: Commercial Managed Care - PPO | Admitting: Ophthalmology

## 2023-08-08 ENCOUNTER — Encounter (INDEPENDENT_AMBULATORY_CARE_PROVIDER_SITE_OTHER): Payer: Self-pay | Admitting: Ophthalmology

## 2023-08-08 DIAGNOSIS — H35711 Central serous chorioretinopathy, right eye: Secondary | ICD-10-CM

## 2023-08-08 DIAGNOSIS — I10 Essential (primary) hypertension: Secondary | ICD-10-CM

## 2023-08-08 DIAGNOSIS — H33301 Unspecified retinal break, right eye: Secondary | ICD-10-CM | POA: Diagnosis not present

## 2023-08-08 DIAGNOSIS — H25813 Combined forms of age-related cataract, bilateral: Secondary | ICD-10-CM

## 2023-08-08 DIAGNOSIS — H35033 Hypertensive retinopathy, bilateral: Secondary | ICD-10-CM

## 2023-08-08 DIAGNOSIS — H4311 Vitreous hemorrhage, right eye: Secondary | ICD-10-CM | POA: Diagnosis not present

## 2023-08-08 DIAGNOSIS — H353211 Exudative age-related macular degeneration, right eye, with active choroidal neovascularization: Secondary | ICD-10-CM | POA: Diagnosis not present

## 2023-08-08 DIAGNOSIS — H33103 Unspecified retinoschisis, bilateral: Secondary | ICD-10-CM

## 2023-08-08 MED ORDER — PREDNISOLONE ACETATE 1 % OP SUSP: 1.0000 [drp] | Freq: Four times a day (QID) | OPHTHALMIC | 0 refills | Status: DC

## 2023-08-09 ENCOUNTER — Telehealth: Payer: Self-pay | Admitting: *Deleted

## 2023-08-09 NOTE — Telephone Encounter (Signed)
Attempted to reach patient for her meaningful use call and reminder of appt. With Dr. Pricilla Holm on Friday, November 1st at 1215. Through spanish interpreter id [phone] left voicemail reminding patient of her appt. This Friday.

## 2023-08-11 NOTE — Progress Notes (Signed)
Gynecologic Oncology Return Clinic Visit  08/12/23  Reason for Visit: follow-up  Treatment History: The patient reports normal Pap smears up until 2018.  Her Pap smear history is outlined below. 06/2014: NIML pap, HR HPV neg 07/2017: LSIL pap, HR HPV + 08/2017: cervical biopsy with focal koilocytotic atypia, no definitive dysplasia 02/2018: LSIL pap, HR HPV + 03/2018: cervical biopsy - CIN1 10/2018: NIML pap, HR HPV neg 10/2019: LSIL pap, HPV 16/18 negative 12/2019: cervical biopsy with focal koilocytotic atypia, no definitive dysplasia 06/2020: LSIL pap, HR HPV + 11/2020: LSIL pap 01/2021: cervical biopsy - CIN1 12/2021: LSIL pap, HR HPV + (16/18/45 negative) 01/2022: cervical biopsies - CIN1 02/2023: LSIL pap, HR HPV + 05/2023: ECC - CIN1; right vaginal apex biopsy - VAIN2  06/30/23: cold knife conization of cervix, post-cone endocervical curettage, right upper vaginectomy, CO2 laser ablation of the vagina   Interval History: Bleed approx 12 days like a period but lighter and without clots. Had some pelvic pain during this time, now resolved. Having some urinary symptoms (frequency).  Past Medical/Surgical History: 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   followed by dr [name];  pt takes  inspra for excess fluid in retina,   Sciatic nerve injury    Seasonal allergies    Vaginal dysplasia    Wears glasses     Past Surgical History:  Procedure Laterality Date   CERVICAL CONIZATION W/BX N/A 06/30/2023   Procedure: CONIZATION CERVIX WITH BIOPSY, ENDOCERVICAL CURETTAGE;  Surgeon: Carver Fila, MD;  Location: [facility];  Service:  Gynecology;  Laterality: N/A;   CO2 LASER APPLICATION N/A 06/30/2023   Procedure: CO2 LASER TO THE VAGINA;  Surgeon: Carver Fila, MD;  Location: [facility];  Service: Gynecology;  Laterality: N/A;   COLONOSCOPY  12/14/2022   HYSTEROSCOPY WITH RESECTOSCOPE  11/25/2006   @[facility]  by dr [name]. [name];   Resectoscopic myomectomy and polypectomy   VAGINECTOMY, PARTIAL N/A 06/30/2023   Procedure: SMALL VAGINECTOMY, PARTIAL BIOSPIES;  Surgeon: Carver Fila, MD;  Location: [facility];  Service: Gynecology;  Laterality: N/A;    Family History  Problem Relation Age of Onset   Hypertension Mother    Diabetes Father    Diabetes Maternal Grandmother    Hypertension Maternal Grandmother    Colon cancer Neg Hx    Colon polyps Neg Hx    Esophageal cancer Neg Hx    Rectal cancer Neg Hx    Stomach cancer Neg Hx     Social History   Socioeconomic History   Marital status: Single    Spouse name: Not on file   Number of children: Not on file   Years of education: Not on file   Highest education level: Not on file  Occupational History   Occupation: Merchandiser, retail in a hotel  Tobacco Use   Smoking status: Never   Smokeless tobacco: Never  Vaping Use   Vaping status: Never Used  Substance and Sexual Activity   Alcohol use: No    Alcohol/week: 0.0 standard drinks of alcohol   Drug use:  Never   Sexual activity: Yes    Partners: Male    Birth control/protection: Condom  Other Topics Concern   Not on file  Social History Narrative   Not on file   Social Determinants of Health   Financial Resource Strain: Not on file  Food Insecurity: Not on file  Transportation Needs: Not on file  Physical Activity: Not on file  Stress: Not on file  Social Connections: Unknown (02/23/2022)   Received from [facility]   Social Network    Social Network: Not on file    Current Medications:  Current Outpatient Medications:    diphenhydrAMINE HCl  (BENADRYL ALLERGY PO), Take by mouth as needed., Disp: , Rfl:    eplerenone (INSPRA) 50 MG tablet, Take 50 mg by mouth at bedtime., Disp: , Rfl:    estradiol (VIVELLE-DOT) 0.05 MG/24HR patch, Place 1 patch (0.05 mg total) onto the skin 2 (two) times a week., Disp: 24 patch, Rfl: 4   fexofenadine (ALLEGRA) 180 MG tablet, Take 180 mg by mouth daily as needed for allergies., Disp: , Rfl:    hydrochlorothiazide (HYDRODIURIL) 25 MG tablet, Take 25 mg by mouth daily., Disp: , Rfl:    lisinopril (PRINIVIL,ZESTRIL) 20 MG tablet, Take 1 tablet (20 mg total) by mouth daily., Disp: 30 tablet, Rfl: 12   prednisoLONE acetate (PRED FORTE) 1 % ophthalmic suspension, Place 1 drop into the right eye 4 (four) times daily for 7 days., Disp: 5 mL, Rfl: 0   progesterone (PROMETRIUM) 100 MG capsule, Take 1 capsule (100 mg total) by mouth at bedtime., Disp: 90 capsule, Rfl: 4   senna-docusate (SENOKOT-S) 8.6-50 MG tablet, Take 2 tablets by mouth at bedtime. For AFTER surgery, do not take if having diarrhea (Patient not taking: Reported on 06/24/2023), Disp: 30 tablet, Rfl: 0   traMADol (ULTRAM) 50 MG tablet, Take 1 tablet (50 mg total) by mouth every 6 (six) hours as needed for severe pain. For AFTER surgery, do not take and drive (Patient not taking: Reported on 06/24/2023), Disp: 10 tablet, Rfl: 0  Review of Systems: Denies appetite changes, fevers, chills, fatigue, unexplained weight changes. Denies hearing loss, neck lumps or masses, mouth sores, ringing in ears or voice changes. Denies cough or wheezing.  Denies shortness of breath. Denies chest pain or palpitations. Denies leg swelling. Denies abdominal distention, pain, blood in stools, constipation, diarrhea, nausea, vomiting, or early satiety. Denies pain with intercourse, dysuria, frequency, hematuria or incontinence. Denies hot flashes, pelvic pain, vaginal bleeding or vaginal discharge.   Denies joint pain, back pain or muscle pain/cramps. Denies itching,  rash, or wounds. Denies dizziness, headaches, numbness or seizures. Denies swollen lymph nodes or glands, denies easy bruising or bleeding. Denies anxiety, depression, confusion, or decreased concentration.  Physical Exam: BP 110/62 (BP Location: Left Arm, Patient Position: Sitting)   Pulse 71   Temp 98.8 F (37.1 C) (Oral)   Resp 20   Wt 164 lb 9.6 oz (74.7 kg)   LMP 02/18/2022 Comment: sexually active, condoms  SpO2 100%   BMI 28.25 kg/m  General: Alert, oriented, no acute distress. HEENT: Normocephalic, atraumatic, sclera anicteric. GU: Normal appearing external genitalia without erythema, excoriation, or lesions.  Speculum exam reveals cone bed healing well as well as vaginal apex biopsy site.  No significant discharge or bleeding.  Bimanual exam reveals cervix is smooth, no masses or nodularity.    Laboratory & Radiologic Studies: A. VAGINAL APEX, RIGHT, AT 10 OC, BIOPSY:  Low-grade squamous intraepithelial lesion (LSIL / VaIN 1).   B. CERVIX, CONE:       Low-grade squamous intraepithelial lesion (LSIL / CIN 1).       No definitive high-grade dysplasia identified.       Endocervical margin and exocervical margin are negative for  high-grade dysplasia.   C. ENDOCERVIX, POST CONE, CURETTAGE:       Endocervical glands without significant diagnostic alteration.       Squamous epithelium with reactive changes.       Negative for glandular hyperplasia, atypia or malignancy.   Assessment & Plan: [name] is a 52 y.o. woman s/p CKC and vaginal biopsy in the setting of persistent low-grade cervical dysplasia, recent biopsy showing vaginal dysplasia.  Patient is doing well post operatively.  Discussed continued expectations and restrictions.  I reviewed pathology with her from surgery.  Cold knife cone confirms low-grade dysplasia without high-grade dysplasia.  I will clarify with pathology whether margins are negative for low-grade dysplasia.  ECC after the  cone was negative for any dysplasia.  Vaginal apex biopsy confirmed low-grade dysplasia.  Given urinary symptoms, plan for UA and culture today.  We will call her with these results.  In the setting of vaginal dysplasia, she will benefit from every 6 month surveillance visits with her OB/GYN.  She will be due for Pap and HPV testing in 1 year.  18 minutes of total time was spent for this patient encounter, including preparation, face-to-face counseling with the patient and coordination of care, and documentation of the encounter.  Eugene Garnet, MD  Division of Gynecologic Oncology  Department of Obstetrics and Gynecology  [facility]

## 2023-08-12 ENCOUNTER — Inpatient Hospital Stay: Payer: Commercial Managed Care - PPO | Attending: Gynecologic Oncology | Admitting: Gynecologic Oncology

## 2023-08-12 ENCOUNTER — Inpatient Hospital Stay: Payer: Commercial Managed Care - PPO

## 2023-08-12 ENCOUNTER — Telehealth: Payer: Self-pay

## 2023-08-12 ENCOUNTER — Encounter: Payer: Self-pay | Admitting: Gynecologic Oncology

## 2023-08-12 ENCOUNTER — Other Ambulatory Visit: Payer: Self-pay | Admitting: Gynecologic Oncology

## 2023-08-12 VITALS — BP 110/62 | HR 71 | Temp 98.8°F | Resp 20 | Wt 164.6 lb

## 2023-08-12 DIAGNOSIS — N890 Mild vaginal dysplasia: Secondary | ICD-10-CM | POA: Diagnosis not present

## 2023-08-12 DIAGNOSIS — N870 Mild cervical dysplasia: Secondary | ICD-10-CM | POA: Diagnosis present

## 2023-08-12 DIAGNOSIS — R350 Frequency of micturition: Secondary | ICD-10-CM

## 2023-08-12 DIAGNOSIS — N390 Urinary tract infection, site not specified: Secondary | ICD-10-CM

## 2023-08-12 DIAGNOSIS — Z9079 Acquired absence of other genital organ(s): Secondary | ICD-10-CM

## 2023-08-12 DIAGNOSIS — Z7189 Other specified counseling: Secondary | ICD-10-CM

## 2023-08-12 LAB — URINALYSIS, COMPLETE (UACMP) WITH MICROSCOPIC
Bilirubin Urine: NEGATIVE
Glucose, UA: NEGATIVE mg/dL
Hgb urine dipstick: NEGATIVE
Ketones, ur: NEGATIVE mg/dL
Nitrite: NEGATIVE
Protein, ur: NEGATIVE mg/dL
Specific Gravity, Urine: 1.021 (ref 1.005–1.030)
WBC, UA: >50 WBC/hpf (ref 0–5)
pH: 6 (ref 5.0–8.0)

## 2023-08-12 LAB — SURGICAL PATHOLOGY

## 2023-08-12 MED ORDER — NITROFURANTOIN MONOHYD MACRO 100 MG PO CAPS
100.0000 mg | ORAL_CAPSULE | Freq: Two times a day (BID) | ORAL | 0 refills | Status: DC
Start: 2023-08-12 — End: 2023-09-12

## 2023-08-12 NOTE — Progress Notes (Signed)
[facility] - Clinic Note  08/15/2023     CHIEF COMPLAINT Patient presents for Retina Follow Up   HISTORY OF PRESENT ILLNESS: [name] is a 52 y.o. female who presents to the clinic today for:  HPI     Retina Follow Up   In right eye.  This started 2.  Duration of 2 weeks.  Since onset it is gradually worsening.        Comments   2 week retina follow up schisis pt is reporting over the weekend she was having pain and discomfort in the right eye she noticed that her right eye was little red today she reports vision is about the same she is using PF tid OD      Last edited by Etheleen Mayhew, COT on 08/15/2023  1:29 PM.     Patient states the right eye is painful and she is sleeping with her head elevated. She feels the vision is worse.  Referring physician: [name], PA-C [address] [place],  [place] [address]  HISTORICAL INFORMATION:   Selected notes from the MEDICAL RECORD NUMBER Referred by Dr. [name] for concern of sub-macular fluid / retinoschisis   CURRENT MEDICATIONS: Current Outpatient Medications (Ophthalmic Drugs)  Medication Sig   prednisoLONE acetate (PRED FORTE) 1 % ophthalmic suspension Place 1 drop into the right eye 4 (four) times daily for 7 days.   No current facility-administered medications for this visit. (Ophthalmic Drugs)   Current Outpatient Medications (Other)  Medication Sig   diphenhydrAMINE HCl (BENADRYL ALLERGY PO) Take by mouth as needed.   eplerenone (INSPRA) 50 MG tablet Take 50 mg by mouth at bedtime.   estradiol (VIVELLE-DOT) 0.05 MG/24HR patch Place 1 patch (0.05 mg total) onto the skin 2 (two) times a week.   fexofenadine (ALLEGRA) 180 MG tablet Take 180 mg by mouth daily as needed for allergies.   hydrochlorothiazide (HYDRODIURIL) 25 MG tablet Take 25 mg by mouth daily.   lisinopril (PRINIVIL,ZESTRIL) 20 MG tablet Take 1 tablet (20 mg total) by mouth  daily.   nitrofurantoin, macrocrystal-monohydrate, (MACROBID) 100 MG capsule Take 1 capsule (100 mg total) by mouth 2 (two) times daily.   progesterone (PROMETRIUM) 100 MG capsule Take 1 capsule (100 mg total) by mouth at bedtime.   No current facility-administered medications for this visit. (Other)   REVIEW OF SYSTEMS: ROS   Positive for: Cardiovascular, Eyes Negative for: Constitutional, Gastrointestinal, Neurological, Skin, Genitourinary, Musculoskeletal, HENT, Endocrine, Respiratory, Psychiatric, Allergic/Imm, Heme/Lymph Last edited by Etheleen Mayhew, COT on 08/15/2023  1:29 PM.       ALLERGIES No Known Allergies  PAST MEDICAL HISTORY 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   followed by dr [name];  pt takes  inspra for excess fluid in retina,   Sciatic nerve injury    Seasonal allergies    Vaginal dysplasia    Wears glasses    Past Surgical History:  Procedure Laterality Date   CERVICAL CONIZATION W/BX N/A 06/30/2023   Procedure: CONIZATION CERVIX WITH BIOPSY, ENDOCERVICAL CURETTAGE;  Surgeon: Carver Fila, MD;  Location: [facility];  Service: Gynecology;  Laterality: N/A;   CO2 LASER APPLICATION N/A 06/30/2023   Procedure: CO2 LASER TO THE VAGINA;  Surgeon: Carver Fila, MD;  Location: [facility];  Service: Gynecology;  Laterality: N/A;   COLONOSCOPY  12/14/2022   HYSTEROSCOPY WITH RESECTOSCOPE  11/25/2006   @[facility]  by dr [name]. [name];   Resectoscopic myomectomy and polypectomy   VAGINECTOMY, PARTIAL N/A 06/30/2023   Procedure: SMALL VAGINECTOMY, PARTIAL BIOSPIES;  Surgeon: Carver Fila,  MD;  Location: [facility];  Service: Gynecology;  Laterality: N/A;   FAMILY HISTORY Family History  Problem Relation Age of Onset   Hypertension Mother    Diabetes Father    Diabetes Maternal Grandmother    Hypertension Maternal Grandmother    Colon cancer Neg Hx    Colon polyps Neg Hx    Esophageal cancer Neg Hx    Rectal cancer Neg Hx    Stomach cancer Neg Hx    SOCIAL HISTORY Social History   Tobacco Use   Smoking status: Never   Smokeless tobacco: Never  Vaping Use   Vaping status: Never Used  Substance Use Topics   Alcohol use: No    Alcohol/week: 0.0 standard drinks of alcohol   Drug use: Never       OPHTHALMIC EXAM: Base Eye Exam     Visual Acuity (Snellen - Linear)       Right Left   Dist cc CF at 3' 20/25 -2   Dist ph cc NI NI         Tonometry (Tonopen, 1:33 PM)       Right Left   Pressure 16 13         Pupils       Pupils Dark Light Shape React APD   Right PERRL 4 3 Round Brisk None   Left PERRL 4 3 Round Brisk None         Extraocular Movement       Right Left    Full, Ortho Full, Ortho         Neuro/Psych     Oriented x3: Yes   Mood/Affect: Normal         Dilation     Right eye: 2.5% Phenylephrine @ 1:34 PM           Slit Lamp and Fundus Exam     Slit Lamp Exam       Right Left   Lids/Lashes Dermatochalasis - upper lid Dermatochalasis - upper lid   Conjunctiva/Sclera Injection, nasal pterygium with +vessels 1+ Injection, nasal Pinguecula   Cornea Nasal ptergyium extending 2.43mm onto nasal cornea 1+Punctate epithelial erosions   Anterior Chamber Deep, clear, narrow temporal angle, No cell or flare deep, narrow temporal angle, 1+pigment   Iris Round and dilated Round and dilated   Lens 2+ Nuclear sclerosis, 2+ Cortical cataract 2+ Nuclear sclerosis, 2+ Cortical cataract   Anterior Vitreous Mild Vitreous syneresis, Posterior vitreous detachment, vitreous condensations -- slightly improved, +RBCs,  blood stained vitreous condensations turning white Mild Vitreous syneresis         Fundus Exam       Right Left   Disc Pink and Sharp, +cupping Pink and Sharp, +cupping, shallow peripapillary SRF just nasal to disc   C/D Ratio 0.65 0.6   Macula Flat, Blunted foveal reflex, focal SRF -- slightly increased, RPE mottling, clumping and atrophy, No heme Flat, good foveal reflex,  Epiretinal membrane, mild RPE mottling and clumping superiorly, No heme or edema   Vessels attenuated, Tortuous Vascular attenuation, mild Tortuousity   Periphery Attached, bullous Retinoschisis cavity from 0700-0900 -- no associated RT/RD - good early laser changes surrounding; new focal schisis cavity ST (1030) -- no RT/RD on scleral depression, mild peripheral cystoid degeneration, pigmented cystoid degeneration inferiorly Attached, temporal retinoschisis cavity from 0300-0430 peripherally -- stable from prior -- with good early laser changes posterior and surrounding, pigmented cystoid degeneration inferiorly           Refraction     Wearing Rx       Sphere Cylinder Axis   Right +1.25 +1.25 112   Left +2.25 +1.00 101           IMAGING AND PROCEDURES  Imaging and Procedures for @TODAY @          ASSESSMENT/PLAN:    ICD-10-CM   1. Vitreous hemorrhage of right eye (HCC)  H43.11 OCT, Retina - OU - Both Eyes    2. 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 Exudative age-related macular degeneration of right eye with active choroidal neovascularization (HCC)  H35.3211     4. 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 Essential hypertension  I10     6. Hypertensive retinopathy of both eyes  H35.033     7. Combined forms of age-related cataract of both eyes  H25.813      Vitreous hemorrhage OD - new onset since Wednesday, 10.16.24, by pt history - pt seeing new floaters and "sparks" in vision - BCVA CF from 20/250 - exam shows white, blood stained vit condensations --  mobile - VH precautions reviewed -- minimize activities, keep head elevated, avoid ASA/NSAIDs/blood thinners as able - f/u 1-2 weeks, DFE, OCT  2,3. CSCR OD -- chronic  - pt reports decreased vision OD since December 2019  - reports significant stressors in life -- work  - denies steroid use - FA (09.30.20) shows focal hyperfluorescent leakage points nasal macula OD -- expansile dot phenotype? - started 50 mg po eplerenone since 09.30.20  -- pt self d/c in February, re-started in June 2021 - s/p IVA OD #1 (09.01.21), #2 (09.29.21), #3 (11.03.21), #4 (12.03.21) -- IVA resistance - s/p IVE OD #1 (01.20.22), #2 (02.18.22), #3 (04.05.22-sample), #4 (05.23.22), # 5 (06.27.22), #6 (07.28.22), #7 (08.26.22), #8 (09.23.22), #9 (10.21.22), #10 (11.18.22), #11 (12.16.22), #12 (01.20.23), #13 (03.02.23), #14 (04.07.23), #15 (05.12.23), #16 (06.09.23), #17 (07.14.23), #18 (08.08.23), #19 (09.08.23), #20 (10.06.23), #21 (11.06.23), #22 (12.04.23), #23 (01.08.24), #24 (02.12.24), #25 (03.18.24), #26 (04.22.24), #27 (05.20.24) -- IVE resistance ============================================================ - s/p IVV OD #1 (07.02.24), #2 (07.30.24), #3 (08.27.24), #4 (09.26.24), #5 (10.24.24)  - BCVA OD CF from 20/250 -- new onset VH - OCT shows Interval improvement in central IRF and edema, new vitreous opacities -- improved, +ORA, persistent bullous schisis IT periphery caught on widefield   - differential includes atypical exudative ARMD  - begin Pred OD q1h and Bromfenac OD QID- sent to rx on file - IVV informed consent obtained and signed, 07.02.24  - continue po eplerenone 50 mg daily  - f/u in 11.22.24 DFE, OCT, possible injection  4. Retinoschisis OU (OD > OS)  - bullous retinoschisis cavity IT quadrant OD-- 7829-5621 oclock  - bullous retinoschisis cavity IT quadrant OS -- 3086-5784 oclock  - no associated retinal holes/RT/RD on scleral depression   - widefield Optos imaging (04.22.24) shows mild  interval progression  of schisis cavities' size and posterior extension in comparison to baseline images  - s/p laser retinopexy OS 08.12.24 -- good laser changes surrounding - s/p laser retinopexy OD (10.07.24) -- good early laser changes surrounidng - new schisis cavity found on examination OD - 1030 periphery -- no RT/RD on scleral depressed exam - recommend laser retinopexy OD to new schisis at 1030 - pt wishes to proceed with laser - RBA of procedure discussed, questions answered - informed consent obtained and signed - see procedure note - start PF QID OD x7 days - f/u 1-2 weeks DFE, OCT  5,6. Hypertensive retinopathy OU  - discussed importance of tight BP control  - monitor  7. Mixed form age related cataracts OU - The symptoms of cataract, surgical options, and treatments and risks were discussed with patient.  - discussed diagnosis and progression  - monitor  Ophthalmic Meds Ordered this visit:  No orders of the defined types were placed in this encounter.    No follow-ups on file.  There are no Patient Instructions on file for this visit.  This document serves as a record of services personally performed by Karie Chimera, MD, PhD. It was created on their behalf by Annalee Genta, COMT. The creation of this record is the provider's dictation and/or activities during the visit.  Electronically signed by: Annalee Genta, COMT 08/15/23 2:27 PM  This document serves as a record of services personally performed by Karie Chimera, MD, PhD. It was created on their behalf by Charlette Caffey, COT an ophthalmic technician. The creation of this record is the provider's dictation and/or activities during the visit.    Electronically signed by:  Charlette Caffey, COT  08/15/23 2:28 PM   Karie Chimera, M.D., Ph.D. Diseases & Surgery of the Retina and Vitreous [facility]    Abbreviations: M myopia (nearsighted); A astigmatism; H hyperopia  (farsighted); P presbyopia; Mrx spectacle prescription;  CTL contact lenses; OD right eye; OS left eye; OU both eyes  XT exotropia; ET esotropia; PEK punctate epithelial keratitis; PEE punctate epithelial erosions; DES dry eye syndrome; MGD meibomian gland dysfunction; ATs artificial tears; PFAT's preservative free artificial tears; NSC nuclear sclerotic cataract; PSC posterior subcapsular cataract; ERM epi-retinal membrane; PVD posterior vitreous detachment; RD retinal detachment; DM diabetes mellitus; DR diabetic retinopathy; NPDR non-proliferative diabetic retinopathy; PDR proliferative diabetic retinopathy; CSME clinically significant macular edema; DME diabetic macular edema; dbh dot blot hemorrhages; CWS cotton wool spot; POAG primary open angle glaucoma; C/D cup-to-disc ratio; HVF humphrey visual field; GVF goldmann visual field; OCT optical coherence tomography; IOP intraocular pressure; BRVO Branch retinal vein occlusion; CRVO central retinal vein occlusion; CRAO central retinal artery occlusion; BRAO branch retinal artery occlusion; RT retinal tear; SB scleral buckle; PPV pars plana vitrectomy; VH Vitreous hemorrhage; PRP panretinal laser photocoagulation; IVK intravitreal kenalog; VMT vitreomacular traction; MH Macular hole;  NVD neovascularization of the disc; NVE neovascularization elsewhere; AREDS age related eye disease study; ARMD age related macular degeneration; POAG primary open angle glaucoma; EBMD epithelial/anterior basement membrane dystrophy; ACIOL anterior chamber intraocular lens; IOL intraocular lens; PCIOL posterior chamber intraocular lens; Phaco/IOL phacoemulsification with intraocular lens placement; PRK photorefractive keratectomy; LASIK laser assisted in situ keratomileusis; HTN hypertension; DM diabetes mellitus; COPD chronic obstructive pulmonary disease

## 2023-08-12 NOTE — Telephone Encounter (Signed)
-----   Message from Doylene Bode sent at 08/12/2023  2:42 PM EDT ----- Please let the patient know with phone interpreter it does look like she has an infection in her urine. I have sent in an antibiotic. Please let her know the pharm where it went since she has a few listed. The culture is pending. If we need to send in another antibiotic after this returns based on the results, we will call her. She needs to let us know if her symptoms persist.

## 2023-08-12 NOTE — Telephone Encounter (Signed)
[facility] [employer] [phone], pt is aware of recent urinalysis results. She will pick the prescription up and is aware abx may need to be changed according to the urine culture results. She voiced an understanding.

## 2023-08-12 NOTE — Patient Instructions (Signed)
It was good to see you today!  You are healing well from surgery.  Please remember, you will need follow-up with your OB/GYN every 6 months.  Please do not hesitate to contact me if you need anything in the future.

## 2023-08-14 LAB — URINE CULTURE: Culture: 20000 — AB

## 2023-08-14 NOTE — Progress Notes (Signed)
HI [name] and [name], Could one of you call her Monday and let her know that there was a mild infection that should be covered by antibiotic that was sent in? Hopefully she has already noticed a difference. Thank you

## 2023-08-15 ENCOUNTER — Telehealth: Payer: Self-pay | Admitting: *Deleted

## 2023-08-15 ENCOUNTER — Ambulatory Visit (INDEPENDENT_AMBULATORY_CARE_PROVIDER_SITE_OTHER): Payer: Commercial Managed Care - PPO | Admitting: Ophthalmology

## 2023-08-15 ENCOUNTER — Encounter (INDEPENDENT_AMBULATORY_CARE_PROVIDER_SITE_OTHER): Payer: Self-pay | Admitting: Ophthalmology

## 2023-08-15 DIAGNOSIS — H33103 Unspecified retinoschisis, bilateral: Secondary | ICD-10-CM

## 2023-08-15 DIAGNOSIS — H4311 Vitreous hemorrhage, right eye: Secondary | ICD-10-CM | POA: Diagnosis not present

## 2023-08-15 DIAGNOSIS — H353211 Exudative age-related macular degeneration, right eye, with active choroidal neovascularization: Secondary | ICD-10-CM | POA: Diagnosis not present

## 2023-08-15 DIAGNOSIS — H25813 Combined forms of age-related cataract, bilateral: Secondary | ICD-10-CM

## 2023-08-15 DIAGNOSIS — H35033 Hypertensive retinopathy, bilateral: Secondary | ICD-10-CM

## 2023-08-15 DIAGNOSIS — H35711 Central serous chorioretinopathy, right eye: Secondary | ICD-10-CM | POA: Diagnosis not present

## 2023-08-15 DIAGNOSIS — I10 Essential (primary) hypertension: Secondary | ICD-10-CM

## 2023-08-15 MED ORDER — PREDNISOLONE ACETATE 1 % OP SUSP: 1.0000 [drp] | OPHTHALMIC | 1 refills | Status: DC

## 2023-08-15 MED ORDER — BROMFENAC SODIUM (ONCE-DAILY) 0.09 % OP SOLN: 1.0000 [drp] | Freq: Four times a day (QID) | OPHTHALMIC | 1 refills | Status: DC

## 2023-08-15 NOTE — Telephone Encounter (Signed)
Spoke with patient through pacific spanish interpreter id [phone] and relayed message from Dr.Tucker that patient's urine results show that there is a mild infection that should be covered by antibiotic that was sent in. Pt states she feels better and denies all urinary symptoms. Pt thanked the office for calling. Advised patient to call with any concerns or questions.

## 2023-08-15 NOTE — Telephone Encounter (Signed)
-----   Message from Carver Fila sent at 08/14/2023  3:38 PM EST ----- HI Marijean Niemann and Clydie Braun, Could one of you call her Monday and let her know that there was a mild infection that should be covered by antibiotic that was sent in? Hopefully she has already noticed a difference. Thank you

## 2023-08-16 ENCOUNTER — Encounter (INDEPENDENT_AMBULATORY_CARE_PROVIDER_SITE_OTHER): Payer: Self-pay | Admitting: Ophthalmology

## 2023-08-16 ENCOUNTER — Other Ambulatory Visit (INDEPENDENT_AMBULATORY_CARE_PROVIDER_SITE_OTHER): Payer: Self-pay

## 2023-08-16 MED ORDER — PREDNISOLONE ACETATE 1 % OP SUSP: 1.0000 [drp] | OPHTHALMIC | 1 refills | Status: DC

## 2023-08-16 MED ORDER — BROMFENAC SODIUM (ONCE-DAILY) 0.09 % OP SOLN: 1.0000 [drp] | Freq: Four times a day (QID) | OPHTHALMIC | 1 refills | Status: DC

## 2023-08-17 ENCOUNTER — Telehealth: Payer: Self-pay | Admitting: Oncology

## 2023-08-17 NOTE — Progress Notes (Signed)
Could you please let the patient know that margins on her cone were negative for low grade dysplasia or precancer? Thank you

## 2023-08-17 NOTE — Telephone Encounter (Signed)
[name] with the assistance of [address] [employer] Zollie Scale [id number].  Advised Tyffany that the margins on her [facility] were negative for low grade dysplasia or precancer.  She verbalized understanding and did not have any further questions.

## 2023-08-22 NOTE — Progress Notes (Signed)
[facility] - Clinic Note  08/29/2023     CHIEF COMPLAINT Patient presents for Retina Follow Up   HISTORY OF PRESENT ILLNESS: [name] is a 52 y.o. female who presents to the clinic today for:  HPI     Retina Follow Up   Patient presents with  Other.  In right eye.  Severity is severe.  Duration of 2 weeks.  Since onset it is stable.  I, the attending physician,  performed the HPI with the patient and updated documentation appropriately.        Comments   2 week Retina eval for VH od. Patient states od is not any better. No improvement      Last edited by [name], MD on 08/29/2023  4:59 PM.    Patient states the vision is not any better since the last injection, using drops, and/or sleeping with the head elevated. She is complaining lights are bothersome.   Referring physician: [name], PA-C [address] [place],  [place] [address]  HISTORICAL INFORMATION:   Selected notes from the MEDICAL RECORD NUMBER Referred by Dr. [name] for concern of sub-macular fluid / retinoschisis   CURRENT MEDICATIONS: Current Outpatient Medications (Ophthalmic Drugs)  Medication Sig   Bromfenac Sodium 0.09 % SOLN Apply 1 drop to eye in the morning, at noon, in the evening, and at bedtime. 1 drop in the right eye four times a day   prednisoLONE acetate (PRED FORTE) 1 % ophthalmic suspension Place 1 drop into the right eye every hour while awake.   No current facility-administered medications for this visit. (Ophthalmic Drugs)   Current Outpatient Medications (Other)  Medication Sig   diphenhydrAMINE HCl (BENADRYL ALLERGY PO) Take by mouth as needed.   eplerenone (INSPRA) 50 MG tablet Take 50 mg by mouth at bedtime.   estradiol (VIVELLE-DOT) 0.05 MG/24HR patch Place 1 patch (0.05 mg total) onto the skin 2 (two) times a week.   fexofenadine (ALLEGRA) 180 MG tablet Take 180 mg by mouth daily as needed for allergies.    hydrochlorothiazide (HYDRODIURIL) 25 MG tablet Take 25 mg by mouth daily.   lisinopril (PRINIVIL,ZESTRIL) 20 MG tablet Take 1 tablet (20 mg total) by mouth daily.   nitrofurantoin, macrocrystal-monohydrate, (MACROBID) 100 MG capsule Take 1 capsule (100 mg total) by mouth 2 (two) times daily.   progesterone (PROMETRIUM) 100 MG capsule Take 1 capsule (100 mg total) by mouth at bedtime.   No current facility-administered medications for this visit. (Other)   REVIEW OF SYSTEMS: ROS   Positive for: Cardiovascular, Eyes Negative for: Constitutional, Gastrointestinal, Neurological, Skin, Genitourinary, Musculoskeletal, HENT, Endocrine, Respiratory, Psychiatric, Allergic/Imm, Heme/Lymph Last edited by Lana Fish, COT on 08/29/2023  1:44 PM.     ALLERGIES No Known Allergies  PAST MEDICAL HISTORY 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   followed by dr [name];  pt takes  inspra for excess fluid in retina,   Sciatic nerve injury    Seasonal allergies    Vaginal dysplasia    Wears glasses  Past Surgical History:  Procedure Laterality Date   CERVICAL CONIZATION W/BX N/A 06/30/2023   Procedure: CONIZATION CERVIX WITH BIOPSY, ENDOCERVICAL CURETTAGE;  Surgeon: Carver Fila, MD;  Location: [facility];  Service: Gynecology;  Laterality: N/A;   CO2 LASER APPLICATION N/A 06/30/2023   Procedure: CO2 LASER TO THE VAGINA;  Surgeon: Carver Fila, MD;  Location: [facility];  Service: Gynecology;  Laterality: N/A;   COLONOSCOPY  12/14/2022   HYSTEROSCOPY WITH RESECTOSCOPE  11/25/2006   @[facility]  by dr [name]. [name];   Resectoscopic  myomectomy and polypectomy   VAGINECTOMY, PARTIAL N/A 06/30/2023   Procedure: SMALL VAGINECTOMY, PARTIAL BIOSPIES;  Surgeon: Carver Fila, MD;  Location: [facility];  Service: Gynecology;  Laterality: N/A;   FAMILY HISTORY Family History  Problem Relation Age of Onset   Hypertension Mother    Diabetes Father    Diabetes Maternal Grandmother    Hypertension Maternal Grandmother    Colon cancer Neg Hx    Colon polyps Neg Hx    Esophageal cancer Neg Hx    Rectal cancer Neg Hx    Stomach cancer Neg Hx    SOCIAL HISTORY Social History   Tobacco Use   Smoking status: Never   Smokeless tobacco: Never  Vaping Use   Vaping status: Never Used  Substance Use Topics   Alcohol use: No    Alcohol/week: 0.0 standard drinks of alcohol   Drug use: Never       OPHTHALMIC EXAM: Base Eye Exam     Visual Acuity (Snellen - Linear)       Right Left   Dist cc CF at 3' 20/20-2    Correction: Glasses         Tonometry (Tonopen, 1:46 PM)       Right Left   Pressure 21 18         Pupils       Dark Light Shape React APD   Right 3 2 Round Minimal None   Left 3 2 Round Minimal None         Visual Fields (Counting fingers)       Left Right    Full          Extraocular Movement       Right Left    Full, Ortho Full, Ortho         Neuro/Psych     Oriented x3: Yes   Mood/Affect: Normal         Dilation     Both eyes: 1.0% Mydriacyl, 2.5% Phenylephrine @ 1:46 PM           Slit Lamp and Fundus Exam     Slit Lamp Exam       Right Left   Lids/Lashes Dermatochalasis - upper lid Dermatochalasis - upper lid   Conjunctiva/Sclera Trace Injection, nasal pterygium with +vessels 1+ Injection, nasal Pinguecula   Cornea Nasal ptergyium extending 2.37mm onto nasal cornea, trace PEE, fine endo pigment/KP 1+Punctate epithelial erosions   Anterior Chamber Deep, narrow temporal angle, 1-2+ fine cell/ flare deep, narrow temporal angle, 1+pigment    Iris Round and dilated Round and dilated   Lens 2+ Nuclear sclerosis, 2+ Cortical cataract, fine pigment deposition 2+ Nuclear sclerosis, 2+ Cortical cataract   Anterior Vitreous Mild Vitreous syneresis, Posterior vitreous detachment, vitreous condensations/haze, +pigment vs +RBCs, blood stained vitreous condensations turning white Mild Vitreous syneresis         Fundus Exam  Right Left   Disc No View Pink and Sharp, +cupping, shallow peripapillary SRF just nasal to disc   C/D Ratio 0.65 0.6   Macula No view (previous Flat, Blunted foveal reflex, focal SRF -- slightly increased, RPE mottling, clumping and atrophy, No heme) Flat, good foveal reflex, Epiretinal membrane, mild RPE mottling and clumping superiorly, No heme or edema   Vessels attenuated, Tortuous Vascular attenuation, mild Tortuousity   Periphery Attached, bullous Retinoschisis cavity from 0700-0900 -- no associated RT/RD - good early laser changes surrounding; new focal schisis cavity ST (1030) -- no RT/RD on scleral depression, mild peripheral cystoid degeneration, pigmented cystoid degeneration inferiorly Attached, temporal retinoschisis cavity from 0300-0430 peripherally -- stable from prior -- with good early laser changes posterior and surrounding, pigmented cystoid degeneration inferiorly           Refraction     Wearing Rx       Sphere Cylinder Axis   Right +1.25 +1.25 112   Left +2.25 +1.00 101           IMAGING AND PROCEDURES  Imaging and Procedures for @TODAY @  OCT, Retina - OU - Both Eyes       Right Eye Quality was poor. Progression has worsened. Findings include abnormal foveal contour, intraretinal hyper-reflective material, intraretinal fluid, subretinal fluid, outer retinal atrophy, vitreomacular adhesion (No interpretable images, PRIOR:Interval improvement in central IRF and edema, new vitreous opacities -- improved, +ORA, persistent bullous schisis IT periphery caught on widefield ).    Left Eye Quality was good. Central Foveal Thickness: 305. Progression has been stable. Findings include normal foveal contour, no IRF, no SRF, subretinal fluid, vitreomacular adhesion (Small pocket of SRF nasal disc caught on widefield -- persistent, bullous schisis cavity IT periphery caught on widefield -- not imaged today).   Notes *Images captured and stored on drive  Diagnosis / Impression:  OD: No interpretable images due to vit opacities, PRIOR: Interval improvement in central IRF and edema; new vitreous opacities -- improved, +ORA, persistent bullous schisis IT periphery caught on widefield  OS: Small pocket of SRF nasal disc caught on widefield -- persistent, bullous schisis cavity IT periphery caught on widefield  Clinical management:  See below  Abbreviations: NFP - Normal foveal profile. CME - cystoid macular edema. PED - pigment epithelial detachment. IRF - intraretinal fluid. SRF - subretinal fluid. EZ - ellipsoid zone. ERM - epiretinal membrane. ORA - outer retinal atrophy. ORT - outer retinal tubulation. SRHM - subretinal hyper-reflective material            ASSESSMENT/PLAN:    ICD-10-CM   1. Vitreous hemorrhage of right eye (HCC)  H43.11     2. 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 Essential hypertension  I10     6. Hypertensive retinopathy of both eyes  H35.033     7. Combined forms of age-related cataract of both eyes  H25.813     8. Right retinal defect  H33.301     9. 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 CSCR OD -- chronic  - pt reports decreased vision OD since December 2019  - reports significant stressors in life -- work  - denies steroid use - FA (09.30.20) shows focal hyperfluorescent leakage points nasal macula OD -- expansile dot phenotype? - started 50 mg po eplerenone since 09.30.20  -- pt self d/c in February, re-started in June 2021 - s/p IVA OD #1 (09.01.21), #2 (09.29.21), #3 (11.03.21), #4 (12.03.21) -- IVA resistance - s/p IVE OD #1 (01.20.22), #2 (02.18.22), #3 (04.05.22-sample), #4 (05.23.22), # 5 (06.27.22), #6 (07.28.22), #7 (08.26.22), #8 (09.23.22), #9 (10.21.22), #10 (11.18.22), #11 (12.16.22), #12 (01.20.23), #13 (03.02.23), #14 (04.07.23), #15 (05.12.23), #16 (06.09.23), #17 (07.14.23), #18 (08.08.23), #19 (09.08.23), #20 (10.06.23), #21 (11.06.23), #22 (12.04.23), #23 (01.08.24), #24 (02.12.24), #25 (03.18.24), #26 (04.22.24), #27 (05.20.24) -- IVE resistance ======================================================== - s/p IVV OD #1 (07.02.24), #2 (07.30.24), #3 (08.27.24), #4 (09.26.24), #5 (10.24.24)  - BCVA OD CF from 20/250 -- new onset VH - OCT -- no interpretable images today due to vit opacities  - differential includes atypical exudative ARMD - IVV informed consent obtained and signed, 07.02.24  - continue po eplerenone 50 mg daily  - f/u in 11.25.24 DFE, OCT, possible injection  4. Retinoschisis OU (OD > OS)  - bullous retinoschisis cavity IT quadrant OD-- 6578-4696 oclock  - bullous retinoschisis cavity IT quadrant OS -- 2952-8413 oclock  - no associated retinal holes/RT/RD on scleral depression   - widefield [employer] imaging (04.22.24) shows mild interval progression of schisis cavities' size and posterior extension in comparison to baseline images  - s/p laser  retinopexy OS 08.12.24 -- good laser changes surrounding - s/p laser retinopexy OD (10.07.24) -- good early laser changes surrounding - new schisis cavity found on examination OD (10.28.24) - 1030 periphery -- no RT/RD on scleral depressed exam - s/p laser retinopexy OD to new schisis at 1030 on 10.28.24 - now with [facility] +/- vit haze - f/u 1-2 weeks DFE, OCT  5,6. Hypertensive retinopathy OU  - discussed importance of tight BP control  - monitor  7. Mixed form age related cataracts OU - The symptoms of cataract, surgical options, and treatments and risks were discussed with patient.  - discussed diagnosis and progression  - monitor  Ophthalmic Meds Ordered this visit:  No orders of the defined types were placed in this encounter.    Return in about 1 week (around 09/05/2023) for f/u VH OD, DFE, OCT.  There are no Patient Instructions on file for this visit.  This document serves as a record of services personally performed by Karie Chimera, MD, PhD. It was created on their behalf by Glee Arvin. Manson Passey, OA an ophthalmic technician. The creation of this record is the provider's dictation and/or activities during the visit.    Electronically signed by: Glee Arvin. Manson Passey, OA 08/29/23 5:00 PM  This document serves as a record of services personally performed by Karie Chimera, MD, PhD. It was created on their behalf by Charlette Caffey, COT an ophthalmic technician. The creation of this record is the provider's dictation and/or activities during the visit.    Electronically signed by:  Charlette Caffey, COT  08/29/23 5:00 PM  Karie Chimera, M.D., Ph.D. Diseases & Surgery of the Retina and Vitreous [facility]  I have reviewed the above documentation for accuracy and completeness, and I agree with the above. Karie Chimera, M.D., Ph.D. 08/29/23 5:01 PM   Abbreviations: M myopia (nearsighted); A astigmatism; H hyperopia (farsighted); P presbyopia; Mrx  spectacle prescription;  CTL contact lenses; OD right eye; OS left eye; OU both eyes  XT exotropia; ET esotropia; PEK punctate epithelial keratitis; PEE punctate epithelial erosions; DES dry eye syndrome; MGD meibomian gland dysfunction; ATs artificial tears; PFAT's preservative free artificial tears; NSC nuclear sclerotic cataract; PSC posterior subcapsular cataract; ERM epi-retinal membrane; PVD posterior vitreous detachment; RD retinal detachment; DM diabetes mellitus; DR diabetic retinopathy; NPDR non-proliferative diabetic retinopathy; PDR proliferative diabetic retinopathy; CSME clinically significant macular edema; DME diabetic macular edema; dbh dot blot hemorrhages; CWS cotton wool spot; POAG primary open angle glaucoma; C/D cup-to-disc ratio; HVF humphrey visual field; GVF goldmann visual field; OCT optical coherence tomography; IOP intraocular pressure; BRVO Branch retinal vein occlusion; CRVO central retinal vein occlusion; CRAO central retinal artery occlusion; BRAO branch retinal artery occlusion; RT retinal tear; SB scleral buckle; PPV pars plana vitrectomy; VH Vitreous hemorrhage; PRP panretinal laser photocoagulation; IVK intravitreal kenalog; VMT vitreomacular traction; MH Macular hole;  NVD neovascularization of the disc; NVE neovascularization elsewhere; AREDS age related eye disease study; ARMD age related macular degeneration; POAG primary open angle glaucoma; EBMD epithelial/anterior basement membrane dystrophy; ACIOL anterior chamber intraocular lens; IOL intraocular lens; PCIOL posterior chamber intraocular lens; Phaco/IOL phacoemulsification with intraocular lens placement; PRK photorefractive keratectomy; LASIK laser assisted in situ keratomileusis; HTN hypertension; DM diabetes mellitus; COPD chronic obstructive pulmonary disease

## 2023-08-29 ENCOUNTER — Ambulatory Visit (INDEPENDENT_AMBULATORY_CARE_PROVIDER_SITE_OTHER): Payer: Commercial Managed Care - PPO | Admitting: Ophthalmology

## 2023-08-29 ENCOUNTER — Encounter (INDEPENDENT_AMBULATORY_CARE_PROVIDER_SITE_OTHER): Payer: Self-pay | Admitting: Ophthalmology

## 2023-08-29 DIAGNOSIS — H35033 Hypertensive retinopathy, bilateral: Secondary | ICD-10-CM

## 2023-08-29 DIAGNOSIS — H35711 Central serous chorioretinopathy, right eye: Secondary | ICD-10-CM | POA: Diagnosis not present

## 2023-08-29 DIAGNOSIS — H33303 Unspecified retinal break, bilateral: Secondary | ICD-10-CM

## 2023-08-29 DIAGNOSIS — I10 Essential (primary) hypertension: Secondary | ICD-10-CM

## 2023-08-29 DIAGNOSIS — H353211 Exudative age-related macular degeneration, right eye, with active choroidal neovascularization: Secondary | ICD-10-CM

## 2023-08-29 DIAGNOSIS — H33103 Unspecified retinoschisis, bilateral: Secondary | ICD-10-CM

## 2023-08-29 DIAGNOSIS — H33301 Unspecified retinal break, right eye: Secondary | ICD-10-CM

## 2023-08-29 DIAGNOSIS — H25813 Combined forms of age-related cataract, bilateral: Secondary | ICD-10-CM

## 2023-08-29 DIAGNOSIS — H4311 Vitreous hemorrhage, right eye: Secondary | ICD-10-CM | POA: Diagnosis not present

## 2023-08-31 NOTE — Progress Notes (Signed)
[facility] - Clinic Note  09/05/2023     CHIEF COMPLAINT Patient presents for Retina Follow Up   HISTORY OF PRESENT ILLNESS: [name] is a 52 y.o. female who presents to the clinic today for:  HPI     Retina Follow Up   Patient presents with  Other.  In right eye.  Severity is severe.  Duration of 1 week.  Since onset it is stable.  I, the attending physician,  performed the HPI with the patient and updated documentation appropriately.        Comments   Patient feels the vision is the same. She is using PF q1h and Prolensa QID OD.      Last edited by [name], MD on 09/06/2023  5:56 PM.    Patient states no significant change in vision  Referring physician: [name], PA-C [address] [place],  [place] [address]  HISTORICAL INFORMATION:   Selected notes from the MEDICAL RECORD NUMBER Referred by Dr. [name] for concern of sub-macular fluid / retinoschisis   CURRENT MEDICATIONS: Current Outpatient Medications (Ophthalmic Drugs)  Medication Sig   Bromfenac Sodium 0.09 % SOLN Apply 1 drop to eye in the morning, at noon, in the evening, and at bedtime. 1 drop in the right eye four times a day   prednisoLONE acetate (PRED FORTE) 1 % ophthalmic suspension Place 1 drop into the right eye every hour while awake.   No current facility-administered medications for this visit. (Ophthalmic Drugs)   Current Outpatient Medications (Other)  Medication Sig   diphenhydrAMINE HCl (BENADRYL ALLERGY PO) Take by mouth as needed.   eplerenone (INSPRA) 50 MG tablet Take 50 mg by mouth at bedtime.   estradiol (VIVELLE-DOT) 0.05 MG/24HR patch Place 1 patch (0.05 mg total) onto the skin 2 (two) times a week.   fexofenadine (ALLEGRA) 180 MG tablet Take 180 mg by mouth daily as needed for allergies.   hydrochlorothiazide (HYDRODIURIL) 25 MG tablet Take 25 mg by mouth daily.   lisinopril (PRINIVIL,ZESTRIL) 20 MG tablet  Take 1 tablet (20 mg total) by mouth daily.   nitrofurantoin, macrocrystal-monohydrate, (MACROBID) 100 MG capsule Take 1 capsule (100 mg total) by mouth 2 (two) times daily.   predniSONE (DELTASONE) 20 MG tablet Take 2 tablets (40 mg total) by mouth daily with breakfast.   progesterone (PROMETRIUM) 100 MG capsule Take 1 capsule (100 mg total) by mouth at bedtime.   No current facility-administered medications for this visit. (Other)   REVIEW OF SYSTEMS: ROS   Positive for: Cardiovascular, Eyes Negative for: Constitutional, Gastrointestinal, Neurological, Skin, Genitourinary, Musculoskeletal, HENT, Endocrine, Respiratory, Psychiatric, Allergic/Imm, Heme/Lymph Last edited by Charlette Caffey, COT on 09/05/2023  1:13 PM.      ALLERGIES No Known Allergies  PAST MEDICAL HISTORY 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   followed by dr [name];  pt takes  inspra for excess fluid in retina,   Sciatic nerve injury    Seasonal allergies    Vaginal dysplasia    Wears glasses  Past Surgical History:  Procedure Laterality Date   CERVICAL CONIZATION W/BX N/A 06/30/2023   Procedure: CONIZATION CERVIX WITH BIOPSY, ENDOCERVICAL CURETTAGE;  Surgeon: Carver Fila, MD;  Location: [facility];  Service: Gynecology;  Laterality: N/A;   CO2 LASER APPLICATION N/A 06/30/2023   Procedure: CO2 LASER TO THE VAGINA;  Surgeon: Carver Fila, MD;  Location: [facility];  Service: Gynecology;  Laterality: N/A;   COLONOSCOPY  12/14/2022   HYSTEROSCOPY WITH RESECTOSCOPE  11/25/2006   @[facility]  by dr [name]. [name];   Resectoscopic myomectomy and  polypectomy   VAGINECTOMY, PARTIAL N/A 06/30/2023   Procedure: SMALL VAGINECTOMY, PARTIAL BIOSPIES;  Surgeon: Carver Fila, MD;  Location: [facility];  Service: Gynecology;  Laterality: N/A;   FAMILY HISTORY Family History  Problem Relation Age of Onset   Hypertension Mother    Diabetes Father    Diabetes Maternal Grandmother    Hypertension Maternal Grandmother    Colon cancer Neg Hx    Colon polyps Neg Hx    Esophageal cancer Neg Hx    Rectal cancer Neg Hx    Stomach cancer Neg Hx    SOCIAL HISTORY Social History   Tobacco Use   Smoking status: Never   Smokeless tobacco: Never  Vaping Use   Vaping status: Never Used  Substance Use Topics   Alcohol use: No    Alcohol/week: 0.0 standard drinks of alcohol   Drug use: Never       OPHTHALMIC EXAM: Base Eye Exam     Visual Acuity (Snellen - Linear)       Right Left   Dist cc CF at 3' 20/20   Dist ph cc NI     Correction: Glasses         Tonometry (Tonopen, 1:16 PM)       Right Left   Pressure 22 17         Pupils       Dark Light Shape React APD   Right 3 2 Round Minimal None   Left 3 2 Round Minimal None         Visual Fields       Left Right    Full Full         Extraocular Movement       Right Left    Full, Ortho Full, Ortho         Neuro/Psych     Oriented x3: Yes   Mood/Affect: Normal         Dilation     Both eyes: 1.0% Mydriacyl @ 1:14 PM           Slit Lamp and Fundus Exam     Slit Lamp Exam       Right Left   Lids/Lashes Dermatochalasis - upper lid Dermatochalasis - upper lid   Conjunctiva/Sclera Trace Injection -- slightly improved, nasal pterygium with +vessels 1+ Injection, nasal Pinguecula   Cornea Nasal ptergyium extending 2.50mm onto nasal cornea, trace PEE, fine endo pigment/KP 1+Punctate epithelial erosions   Anterior Chamber Moderate depth, 3+ fine cell/ pigment, narrow angles deep, narrow temporal angle, 1+pigment   Iris  Irregular pupil, scattered posterior synechia from 1000-1100 and from 0600-0700 Round and dilated   Lens 2+ Nuclear sclerosis, 2+ Cortical cataract, fine pigment deposition 2+ Nuclear sclerosis, 2+ Cortical cataract   Anterior Vitreous Mild Vitreous syneresis, Posterior vitreous detachment, vitreous condensations/haze, +pigment vs +RBCs, blood stained vitreous condensations turning white Mild Vitreous syneresis  Fundus Exam       Right Left   Disc No View Pink and Sharp, +cupping, shallow peripapillary SRF just nasal to disc   C/D Ratio 0.65 0.6   Macula No view (previous Flat, Blunted foveal reflex, focal SRF -- slightly increased, RPE mottling, clumping and atrophy, No heme) Flat, good foveal reflex, Epiretinal membrane, mild RPE mottling and clumping superiorly, No heme or edema   Vessels attenuated, Tortuous Vascular attenuation, mild Tortuousity   Periphery Attached, bullous Retinoschisis cavity from 0700-0900 -- no associated RT/RD - good early laser changes surrounding; new focal schisis cavity ST (1030) -- no RT/RD on scleral depression, mild peripheral cystoid degeneration, pigmented cystoid degeneration inferiorly Attached, temporal retinoschisis cavity from 0300-0430 peripherally -- stable from prior -- with good early laser changes posterior and surrounding, pigmented cystoid degeneration inferiorly           Refraction     Wearing Rx       Sphere Cylinder Axis   Right +1.25 +1.25 112   Left +2.25 +1.00 101           IMAGING AND PROCEDURES  Imaging and Procedures for @TODAY @  OCT, Retina - OU - Both Eyes       Right Eye Quality was poor. Findings include abnormal foveal contour, intraretinal hyper-reflective material, intraretinal fluid, subretinal fluid, outer retinal atrophy, vitreomacular adhesion (No interpretable images, PRIOR:Interval improvement in central IRF and edema, new vitreous opacities -- improved, +ORA, persistent bullous schisis IT  periphery caught on widefield ).   Left Eye Quality was good. Central Foveal Thickness: 304. Progression has been stable. Findings include normal foveal contour, no IRF, no SRF, subretinal fluid, vitreomacular adhesion (Small pocket of SRF nasal disc caught on widefield -- persistent, bullous schisis cavity IT periphery caught on widefield -- not imaged today).   Notes *Images captured and stored on drive  Diagnosis / Impression:  OD: No interpretable images due to vit opacities, PRIOR: Interval improvement in central IRF and edema; new vitreous opacities -- improved, +ORA, persistent bullous schisis IT periphery caught on widefield  OS: Small pocket of SRF nasal disc caught on widefield -- persistent, bullous schisis cavity IT periphery caught on widefield  Clinical management:  See below  Abbreviations: NFP - Normal foveal profile. CME - cystoid macular edema. PED - pigment epithelial detachment. IRF - intraretinal fluid. SRF - subretinal fluid. EZ - ellipsoid zone. ERM - epiretinal membrane. ORA - outer retinal atrophy. ORT - outer retinal tubulation. SRHM - subretinal hyper-reflective material      Intravitreal Injection, Pharmacologic Agent - OD - Right Eye       Time Out 09/05/2023. 1:52 PM. Confirmed correct patient, procedure, site, and patient consented.   Anesthesia Topical anesthesia was used. Anesthetic medications included Lidocaine 2%, Proparacaine 0.5%.   Procedure Preparation included 5% betadine to ocular surface, eyelid speculum. A (32g) needle was used.   Injection: 6 mg faricimab-svoa 6 MG/0.05ML   Route: Intravitreal, Site: Right Eye   NDC: 93235-573-22, Lot: [id number], Expiration date: 08/10/2024, Waste: 0 mL   Post-op Post injection exam found visual acuity of at least counting fingers. The patient tolerated the procedure well. There were no complications. The patient received written and verbal post procedure care education. Post injection medications were  not given.            ASSESSMENT/PLAN:    ICD-10-CM   1. Vitreous hemorrhage of right eye (HCC)  H43.11     2. 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 Essential hypertension  I10     6. Hypertensive retinopathy of both eyes  H35.033     7. Combined forms of age-related cataract of both eyes  H25.813      Vitreous hemorrhage OD - started Wednesday, 10.16.24, by pt history - pt seeing new floaters and "sparks" in vision - BCVA stable at [facility]  - exam shows vit haze and white, blood stained vit condensations -- mobile - new posterior synechiae, but AC cell improved - VH precautions reviewed -- minimize activities, keep head elevated, avoid ASA/NSAIDs/blood thinners as able - continue PF q1h and Prolensa QID OD for possible inflammatory component - will start atropine BID OD - will start 40 mg oral prednisone - recommend IVV OD #6 as below - recommend PPV OD next Thursday, December 5th - f/u next Wednesday, DFE, OCT  2,3. CSCR OD -- chronic  - pt reports decreased vision OD since December 2019  - reports significant stressors in life -- work  - denies steroid use - FA (09.30.20) shows focal hyperfluorescent leakage points nasal macula OD -- expansile dot phenotype? - started 50 mg po eplerenone since 09.30.20  -- pt self d/c in February, re-started in June 2021 - s/p IVA OD #1 (09.01.21), #2 (09.29.21), #3 (11.03.21), #4 (12.03.21) -- IVA resistance - s/p IVE OD #1 (01.20.22), #2 (02.18.22), #3 (04.05.22-sample), #4 (05.23.22), # 5 (06.27.22), #6 (07.28.22), #7 (08.26.22), #8 (09.23.22), #9 (10.21.22), #10 (11.18.22), #11 (12.16.22), #12  (01.20.23), #13 (03.02.23), #14 (04.07.23), #15 (05.12.23), #16 (06.09.23), #17 (07.14.23), #18 (08.08.23), #19 (09.08.23), #20 (10.06.23), #21 (11.06.23), #22 (12.04.23), #23 (01.08.24), #24 (02.12.24), #25 (03.18.24), #26 (04.22.24), #27 (05.20.24) -- IVE resistance ======================================================== - s/p IVV OD #1 (07.02.24), #2 (07.30.24), #3 (08.27.24), #4 (09.26.24), #5 (10.24.24)  - BCVA OD stable at [facility] -- new onset VH - OCT -- no interpretable images today due to vit opacities  - differential includes atypical exudative ARMD  - recommend IVV OS #6 today, 11.25.24  - pt wishes to proceed with injection  - RBA of procedure discussed, questions answered - see procedure note - IVV informed consent obtained and signed, 07.02.24  - continue po eplerenone 50 mg daily  - f/u December 6th, DFE, POV  4. Retinoschisis OU (OD > OS)  - bullous retinoschisis cavity IT quadrant OD-- 1610-9604 oclock  - bullous retinoschisis cavity IT quadrant OS -- 5409-8119 oclock  - no associated retinal holes/RT/RD on scleral depression   - widefield [employer] imaging (04.22.24) shows mild interval progression of schisis cavities' size and posterior extension in comparison to baseline images  - s/p laser retinopexy OS 08.12.24 -- good laser changes surrounding - s/p laser retinopexy OD (10.07.24) -- good early laser changes surrounding - new schisis cavity found on examination OD (10.28.24) - 1030 periphery -- no RT/RD on scleral depressed exam - s/p laser retinopexy OD to new schisis at 1030 on 10.28.24 - now with [facility] +/- vit haze - f/u 1-2 weeks DFE, OCT  5,6. Hypertensive retinopathy OU  - discussed importance of tight BP control  - monitor  7. Mixed form age related cataracts OU - The symptoms of cataract, surgical  options, and treatments and risks were discussed with patient.  - discussed diagnosis and progression  - monitor  Ophthalmic Meds Ordered this visit:  Meds ordered  this encounter  Medications   predniSONE (DELTASONE) 20 MG tablet    Sig: Take 2 tablets (40 mg total) by mouth daily with breakfast.    Dispense:  60 tablet    Refill:  0   faricimab-svoa (VABYSMO) 6mg /0.68mL intravitreal injection     Return in about 9 days (around 09/14/2023) for f/u VH OD, DFE, OCT.  There are no Patient Instructions on file for this visit.  This document serves as a record of services personally performed by Karie Chimera, MD, PhD. It was created on their behalf by Glee Arvin. Manson Passey, OA an ophthalmic technician. The creation of this record is the provider's dictation and/or activities during the visit.    Electronically signed by: Glee Arvin. Manson Passey, OA 09/06/23 5:57 PM  Karie Chimera, M.D., Ph.D. Diseases & Surgery of the Retina and Vitreous [facility]  I have reviewed the above documentation for accuracy and completeness, and I agree with the above. Karie Chimera, M.D., Ph.D. 09/06/23 5:58 PM  Abbreviations: M myopia (nearsighted); A astigmatism; H hyperopia (farsighted); P presbyopia; Mrx spectacle prescription;  CTL contact lenses; OD right eye; OS left eye; OU both eyes  XT exotropia; ET esotropia; PEK punctate epithelial keratitis; PEE punctate epithelial erosions; DES dry eye syndrome; MGD meibomian gland dysfunction; ATs artificial tears; PFAT's preservative free artificial tears; NSC nuclear sclerotic cataract; PSC posterior subcapsular cataract; ERM epi-retinal membrane; PVD posterior vitreous detachment; RD retinal detachment; DM diabetes mellitus; DR diabetic retinopathy; NPDR non-proliferative diabetic retinopathy; PDR proliferative diabetic retinopathy; CSME clinically significant macular edema; DME diabetic macular edema; dbh dot blot hemorrhages; CWS cotton wool spot; POAG primary open angle glaucoma; C/D cup-to-disc ratio; HVF humphrey visual field; GVF goldmann visual field; OCT optical coherence tomography; IOP intraocular  pressure; BRVO Branch retinal vein occlusion; CRVO central retinal vein occlusion; CRAO central retinal artery occlusion; BRAO branch retinal artery occlusion; RT retinal tear; SB scleral buckle; PPV pars plana vitrectomy; VH Vitreous hemorrhage; PRP panretinal laser photocoagulation; IVK intravitreal kenalog; VMT vitreomacular traction; MH Macular hole;  NVD neovascularization of the disc; NVE neovascularization elsewhere; AREDS age related eye disease study; ARMD age related macular degeneration; POAG primary open angle glaucoma; EBMD epithelial/anterior basement membrane dystrophy; ACIOL anterior chamber intraocular lens; IOL intraocular lens; PCIOL posterior chamber intraocular lens; Phaco/IOL phacoemulsification with intraocular lens placement; PRK photorefractive keratectomy; LASIK laser assisted in situ keratomileusis; HTN hypertension; DM diabetes mellitus; COPD chronic obstructive pulmonary disease

## 2023-09-05 ENCOUNTER — Ambulatory Visit (INDEPENDENT_AMBULATORY_CARE_PROVIDER_SITE_OTHER): Payer: Commercial Managed Care - PPO | Admitting: Ophthalmology

## 2023-09-05 ENCOUNTER — Encounter (INDEPENDENT_AMBULATORY_CARE_PROVIDER_SITE_OTHER): Payer: Self-pay | Admitting: Ophthalmology

## 2023-09-05 DIAGNOSIS — H35033 Hypertensive retinopathy, bilateral: Secondary | ICD-10-CM

## 2023-09-05 DIAGNOSIS — I10 Essential (primary) hypertension: Secondary | ICD-10-CM

## 2023-09-05 DIAGNOSIS — H33103 Unspecified retinoschisis, bilateral: Secondary | ICD-10-CM

## 2023-09-05 DIAGNOSIS — H353211 Exudative age-related macular degeneration, right eye, with active choroidal neovascularization: Secondary | ICD-10-CM

## 2023-09-05 DIAGNOSIS — H35711 Central serous chorioretinopathy, right eye: Secondary | ICD-10-CM | POA: Diagnosis not present

## 2023-09-05 DIAGNOSIS — H25813 Combined forms of age-related cataract, bilateral: Secondary | ICD-10-CM

## 2023-09-05 DIAGNOSIS — H4311 Vitreous hemorrhage, right eye: Secondary | ICD-10-CM

## 2023-09-05 MED ORDER — PREDNISONE 20 MG PO TABS: 40.0000 mg | ORAL_TABLET | Freq: Every day | ORAL | 0 refills | Status: DC

## 2023-09-05 MED ORDER — FARICIMAB-SVOA 6 MG/0.05ML IZ SOSY
6.0000 mg | PREFILLED_SYRINGE | INTRAVITREAL | Status: AC | PRN
Start: 2023-09-05 — End: 2023-09-05
  Administered 2023-09-05: 6 mg via INTRAVITREAL

## 2023-09-06 NOTE — Progress Notes (Shared)
[facility] - Clinic Note  09/14/2023     CHIEF COMPLAINT Patient presents for Retina Follow Up   HISTORY OF PRESENT ILLNESS: [name] is a 52 y.o. female who presents to the clinic today for:  HPI     Retina Follow Up   In both eyes.  This started 9 days ago.  Duration of 9 days.  Since onset it is gradually worsening.  I, the attending physician,  performed the HPI with the patient and updated documentation appropriately.        Comments   9 day retina follow up VH OD pt is reporting pain today 5/10 she denies any watering burning or itching she denies any flashes or floaters pt is reporting NLP in OD since last Thursday       Last edited by [name], MD on 09/14/2023  5:21 PM.    Pt states on Thursday (11.28.24), she could not longer see out of her right eye, she started taking the oral prednisone Tuesday morning, she states she had a headache that day and Wednesday night, she started seeing "really strange stuff", she states Thursday night when she went to eat for Thanksgiving, she noticed she could not see out of the eye anymore, pt states it is very stressful bc she cannot focus, she states she has had pain in that eye as well, even trying to turn her head is painful, she states her vision looks like she has a patch over her eye, she is using the drops as directed, but states they burn when she puts them in  Referring physician: [name], PA-C [address] [place],  [place] [address]  HISTORICAL INFORMATION:   Selected notes from the MEDICAL RECORD NUMBER Referred by Dr. [name] for concern of sub-macular fluid / retinoschisis   CURRENT MEDICATIONS: Current Outpatient Medications (Ophthalmic Drugs)  Medication Sig   Bromfenac Sodium 0.09 % SOLN Apply 1 drop to eye in the morning, at noon, in the evening, and at bedtime. 1 drop in the right eye four times a day   prednisoLONE acetate (PRED FORTE) 1 %  ophthalmic suspension Place 1 drop into the right eye every hour while awake.   No current facility-administered medications for this visit. (Ophthalmic Drugs)   Current Outpatient Medications (Other)  Medication Sig   diphenhydrAMINE HCl (BENADRYL ALLERGY PO) Take 25 mg by mouth daily as needed (allergies).   eplerenone (INSPRA) 50 MG tablet Take 50 mg by mouth at bedtime.   hydrochlorothiazide (HYDRODIURIL) 25 MG tablet Take 25 mg by mouth daily.   lisinopril (PRINIVIL,ZESTRIL) 20 MG tablet Take 1 tablet (20 mg total) by mouth daily.   predniSONE (DELTASONE) 20 MG tablet Take 2 tablets (40 mg total) by mouth daily with breakfast.   progesterone (PROMETRIUM) 100 MG capsule Take 1 capsule (100 mg total) by mouth at bedtime.   No current facility-administered medications for this visit. (Other)   REVIEW OF SYSTEMS: ROS   Positive for: Cardiovascular, Eyes Negative for: Constitutional, Gastrointestinal, Neurological, Skin, Genitourinary, Musculoskeletal, HENT, Endocrine, Respiratory, Psychiatric, Allergic/Imm, Heme/Lymph Last edited by Etheleen Mayhew, COT on 09/14/2023  1:40 PM.     ALLERGIES No Known Allergies  PAST MEDICAL HISTORY Past Medical History:  Diagnosis Date   Cervical dysplasia    CIN I (cervical intraepithelial neoplasia I)    High risk HPV infection    History of abnormal cervical Pap smear    12-28-21 lgsil HPV  HR +   History of adenomatous polyp of colon    Hypertension    Hypertensive retinopathy    OU   Macular degeneration, right eye    Retinal defect, 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   followed by dr [name];  pt takes  inspra for excess fluid in retina,   Sciatic nerve injury    Seasonal allergies    Vaginal dysplasia    Wears glasses    Past Surgical History:  Procedure Laterality Date   CERVICAL CONIZATION W/BX N/A 06/30/2023   Procedure: CONIZATION CERVIX WITH BIOPSY, ENDOCERVICAL CURETTAGE;  Surgeon: Carver Fila, MD;  Location: [facility];  Service: Gynecology;  Laterality: N/A;   CO2 LASER APPLICATION N/A 06/30/2023   Procedure: CO2 LASER TO THE VAGINA;  Surgeon: Carver Fila, MD;  Location: [facility];  Service: Gynecology;  Laterality: N/A;   COLONOSCOPY  12/14/2022   HYSTEROSCOPY WITH RESECTOSCOPE  11/25/2006   @[facility]  by dr [name]. [name];   Resectoscopic myomectomy and polypectomy   VAGINECTOMY, PARTIAL N/A 06/30/2023   Procedure: SMALL VAGINECTOMY, PARTIAL BIOSPIES;  Surgeon: Carver Fila, MD;  Location: [facility];  Service: Gynecology;  Laterality: N/A;   FAMILY HISTORY Family History  Problem Relation Age of Onset   Hypertension Mother    Diabetes Father    Diabetes Maternal Grandmother    Hypertension Maternal Grandmother    Colon cancer Neg Hx    Colon polyps Neg Hx    Esophageal cancer Neg Hx    Rectal cancer Neg Hx    Stomach cancer Neg Hx    SOCIAL HISTORY Social History   Tobacco Use   Smoking status: Never   Smokeless tobacco: Never  Vaping Use   Vaping status: Never Used  Substance Use Topics   Alcohol use: No    Alcohol/week: 0.0 standard drinks of alcohol   Drug use: Never       OPHTHALMIC EXAM: Base Eye Exam     Visual Acuity (Snellen - Linear)       Right Left   Dist cc NLP 20/20   Dist ph cc NLP     Correction: Glasses         Tonometry (Tonopen, 1:47 PM)       Right Left   Pressure 4 12         Tonometry #2 (Applanation, 2:14 PM)       Right Left   Pressure 6          Tonometry Comments   Not reading giving error on 2 different tonopen        Pupils       Pupils Dark Light Shape React APD   Right PERRL 3 2 Round Brisk None   Left PERRL 3 2 Round Brisk None         Visual Fields       Left Right    Full Full         Extraocular Movement       Right Left    Full, Ortho Full, Ortho         Neuro/Psych     Oriented x3: Yes   Mood/Affect: Normal           Slit Lamp and  Fundus Exam     Slit Lamp Exam       Right Left   Lids/Lashes Dermatochalasis - upper lid Dermatochalasis - upper lid   Conjunctiva/Sclera Injection -- improved, nasal pterygium  with +vessels 1+ Injection, nasal Pinguecula   Cornea Nasal ptergyium extending 2.12mm onto nasal cornea, trace PEE, fine endo pigment/KP 1+Punctate epithelial erosions   Anterior Chamber Deep, 0.5+ cell / pigment deep, narrow temporal angle, 1+pigment   Iris Irregular pupil, scattered posterior synechia Round and dilated   Lens 2-3+ Nuclear sclerosis, 2+ Cortical cataract, +pigment deposition, broken synechia 2+ Nuclear sclerosis, 2+ Cortical cataract   Anterior Vitreous syneresis, Posterior vitreous detachment, vitreous condensations/haze, +pigment and +RBCs, blood stained vitreous condensations turning white Mild Vitreous syneresis         Fundus Exam       Right Left   Disc No View Pink and Sharp, +cupping, shallow peripapillary SRF just nasal to disc   C/D Ratio 0.65 0.6   Macula No view (previous Flat, Blunted foveal reflex, focal SRF -- slightly increased, RPE mottling, clumping and atrophy, No heme) Flat, good foveal reflex, Epiretinal membrane, mild RPE mottling and clumping superiorly, No heme or edema   Vessels no view Vascular attenuation, mild Tortuousity   Periphery No view Attached, temporal retinoschisis cavity from 0300-0430 peripherally -- stable from prior -- with good early laser changes posterior and surrounding, pigmented cystoid degeneration inferiorly           Refraction     Wearing Rx       Sphere Cylinder Axis   Right +1.25 +1.25 112   Left +2.25 +1.00 101           IMAGING AND PROCEDURES  Imaging and Procedures for @TODAY @  OCT, Retina - OU - Both Eyes       Right Eye Findings include (No image obtained today).   Left Eye Quality was good. Central Foveal Thickness: 306. Progression has been stable. Findings include normal foveal contour, no IRF, subretinal fluid,  vitreomacular adhesion (Small pocket of SRF IN to disc caught on widefield -- persistent, bullous schisis cavity IT periphery caught on widefield ).   Notes *Images captured and stored on drive  Diagnosis / Impression:  OD: no images obtained today OS: Small pocket of SRF IN to disc caught on widefield -- persistent, bullous schisis cavity IT periphery caught on widefield  Clinical management:  See below  Abbreviations: NFP - Normal foveal profile. CME - cystoid macular edema. PED - pigment epithelial detachment. IRF - intraretinal fluid. SRF - subretinal fluid. EZ - ellipsoid zone. ERM - epiretinal membrane. ORA - outer retinal atrophy. ORT - outer retinal tubulation. SRHM - subretinal hyper-reflective material      B-Scan Ultrasound - OD - Right Eye       Quality was good. Findings included vitreous hemorrhage, vitreous opacities.   Notes **Images stored on drive**  Impression: OD: vitreous opacities consistent with hemorrhage vs vitritis; no obvious mass; ?RD           ASSESSMENT/PLAN:    ICD-10-CM   1. Vitreous hemorrhage of right eye (HCC)  H43.11 OCT, Retina - OU - Both Eyes    B-Scan Ultrasound - OD - Right Eye    2. 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 Exudative age-related macular degeneration of right eye with active choroidal neovascularization (HCC)  H35.3211     4. 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 Essential hypertension  I10     6. Hypertensive retinopathy of both eyes  H35.033     7. Combined forms of age-related cataract of both eyes  H25.813      Vitreous  hemorrhage OD - started Wednesday, 10.16.24, by pt history - pt seeing new floaters and "sparks" in vision - BCVA NLP from CF as of Thursday, 11.28.24 per pt report - exam shows vit haze and white, blood stained vit condensations -- mobile, increased - VH precautions reviewed -- minimize activities, keep head elevated, avoid ASA/NSAIDs/blood thinners as able -  continue PF q1h and Prolensa QID OD for possible inflammatory component - started 40mg  po prednisone on 11.26.24 - b-scan 12.04.24 shows significant mobile vit opacities, ?RD - recommend PPV w/ possible diagnostic vit samples, possible RD repair OD under general anesthesia - pt wishes to proceed with surgery - RBA of procedure discussed, questions answered - informed consent obtained and signed  - surgery scheduled for Thursday, September 15, 2023, [facility] OR 8, 1130am - f/u Friday for POV1  2,3. CSCR OD -- chronic  - pt reports decreased vision OD since December 2019  - reports significant stressors in life -- work  - denies steroid use - FA (09.30.20) shows focal hyperfluorescent leakage points nasal macula OD -- expansile dot phenotype? - started 50 mg po eplerenone since 09.30.20  -- pt self d/c in February, re-started in June 2021 - s/p IVA OD #1 (09.01.21), #2 (09.29.21), #3 (11.03.21), #4 (12.03.21) -- IVA resistance - s/p IVE OD #1 (01.20.22), #2 (02.18.22), #3 (04.05.22-sample), #4 (05.23.22), # 5 (06.27.22), #6 (07.28.22), #7 (08.26.22), #8 (09.23.22), #9 (10.21.22), #10 (11.18.22), #11 (12.16.22), #12 (01.20.23), #13 (03.02.23), #14 (04.07.23), #15 (05.12.23), #16 (06.09.23), #17 (07.14.23), #18 (08.08.23), #19 (09.08.23), #20 (10.06.23), #21 (11.06.23), #22 (12.04.23), #23 (01.08.24), #24 (02.12.24), #25 (03.18.24), #26 (04.22.24), #27 (05.20.24) -- IVE resistance ======================================================== - s/p IVV OD #1 (07.02.24), #2 (07.30.24), #3 (08.27.24), #4 (09.26.24), #5 (10.24.24)  - BCVA OD NLP from CF - OCT -- no images obtained today  - differential includes atypical exudative ARMD - IVV informed consent obtained and signed, 07.02.24  - continue po eplerenone 50 mg daily  - f/u in 11.25.24 DFE, OCT, possible injection  4. Retinoschisis OU (OD > OS)  - bullous retinoschisis cavity IT quadrant OD-- 4098-1191 oclock  - bullous retinoschisis cavity IT  quadrant OS -- 4782-9562 oclock  - no associated retinal holes/RT/RD on scleral depression   - widefield Optos imaging (04.22.24) shows mild interval progression of schisis cavities' size and posterior extension in comparison to baseline images  - s/p laser retinopexy OS 08.12.24 -- good laser changes surrounding - s/p laser retinopexy OD (10.07.24) -- good early laser changes surrounding - new schisis cavity found on examination OD (10.28.24) - 1030 periphery -- no RT/RD on scleral depressed exam - s/p laser retinopexy OD to new schisis at 1030 on 10.28.24 - now with [facility] +/- vit haze - f/u 1-2 weeks DFE, OCT  5,6. Hypertensive retinopathy OU  - discussed importance of tight BP control  - monitor  7. Mixed form age related cataracts OU - The symptoms of cataract, surgical options, and treatments and risks were discussed with patient.  - discussed diagnosis and progression  - monitor  Ophthalmic Meds Ordered this visit:  No orders of the defined types were placed in this encounter.    Return in about 2 days (around 09/16/2023) for f/u VH OD, DFE, POV.  There are no Patient Instructions on file for this visit.  This document serves as a record of services personally performed by Karie Chimera, MD, PhD. It was created on their behalf by De Blanch, an ophthalmic technician. The creation of this record is  the provider's dictation and/or activities during the visit.    Electronically signed by: De Blanch, OA, 09/14/23  5:41 PM  This document serves as a record of services personally performed by Karie Chimera, MD, PhD. It was created on their behalf by Glee Arvin. Manson Passey, OA an ophthalmic technician. The creation of this record is the provider's dictation and/or activities during the visit.    Electronically signed by: Glee Arvin. Manson Passey, OA 09/14/23 5:41 PM  Karie Chimera, M.D., Ph.D. Diseases & Surgery of the Retina and Vitreous [facility]  I  have reviewed the above documentation for accuracy and completeness, and I agree with the above. Karie Chimera, M.D., Ph.D. 09/14/23 5:41 PM  Abbreviations: M myopia (nearsighted); A astigmatism; H hyperopia (farsighted); P presbyopia; Mrx spectacle prescription;  CTL contact lenses; OD right eye; OS left eye; OU both eyes  XT exotropia; ET esotropia; PEK punctate epithelial keratitis; PEE punctate epithelial erosions; DES dry eye syndrome; MGD meibomian gland dysfunction; ATs artificial tears; PFAT's preservative free artificial tears; NSC nuclear sclerotic cataract; PSC posterior subcapsular cataract; ERM epi-retinal membrane; PVD posterior vitreous detachment; RD retinal detachment; DM diabetes mellitus; DR diabetic retinopathy; NPDR non-proliferative diabetic retinopathy; PDR proliferative diabetic retinopathy; CSME clinically significant macular edema; DME diabetic macular edema; dbh dot blot hemorrhages; CWS cotton wool spot; POAG primary open angle glaucoma; C/D cup-to-disc ratio; HVF humphrey visual field; GVF goldmann visual field; OCT optical coherence tomography; IOP intraocular pressure; BRVO Branch retinal vein occlusion; CRVO central retinal vein occlusion; CRAO central retinal artery occlusion; BRAO branch retinal artery occlusion; RT retinal tear; SB scleral buckle; PPV pars plana vitrectomy; VH Vitreous hemorrhage; PRP panretinal laser photocoagulation; IVK intravitreal kenalog; VMT vitreomacular traction; MH Macular hole;  NVD neovascularization of the disc; NVE neovascularization elsewhere; AREDS age related eye disease study; ARMD age related macular degeneration; POAG primary open angle glaucoma; EBMD epithelial/anterior basement membrane dystrophy; ACIOL anterior chamber intraocular lens; IOL intraocular lens; PCIOL posterior chamber intraocular lens; Phaco/IOL phacoemulsification with intraocular lens placement; PRK photorefractive keratectomy; LASIK laser assisted in situ keratomileusis;  HTN hypertension; DM diabetes mellitus; COPD chronic obstructive pulmonary disease

## 2023-09-13 ENCOUNTER — Encounter (HOSPITAL_COMMUNITY): Payer: Self-pay | Admitting: Ophthalmology

## 2023-09-13 ENCOUNTER — Other Ambulatory Visit: Payer: Self-pay

## 2023-09-13 NOTE — H&P (Signed)
[name] is an 52 y.o. female.    Chief Complaint: vitreous hemorrhage, RIGHT EYE  HPI: Pt with a history of central serous retinopathy and ex ARMD OD, presented acutely with decreased vision OD on 10.24.24. Pt was s/p laser retinopexy OD for retinoshisis on 10.07.24, then developed new floaters and "sparks" in vision OD on 10.16.24. On exam on 10.24.24, pt was found to have a diffuse vitreous hemorrhage in the right eye, causing her visual acuity OD to drop to 20/250. Medical management has failed to fully clear the vitreous hemorrhage and significantly improve the vision, thus we have recommended surgery to clear the vitreous hemorrhage and debris-- 25g PPV. After a discussion of the risks, benefits and alternatives to surgery, the pt has elected to proceed with 25g PPV OD to clear the vitreous hemorrhage.  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   followed by dr [name];  pt takes  inspra for excess fluid in retina,   Sciatic nerve injury    Seasonal allergies    Vaginal dysplasia    Wears glasses     Past Surgical History:  Procedure Laterality Date   CERVICAL CONIZATION W/BX N/A 06/30/2023   Procedure: CONIZATION CERVIX WITH BIOPSY, ENDOCERVICAL CURETTAGE;  Surgeon: Carver Fila, MD;  Location: [facility];  Service: Gynecology;  Laterality: N/A;   CO2 LASER APPLICATION N/A 06/30/2023   Procedure: CO2 LASER TO THE VAGINA;  Surgeon: Carver Fila, MD;  Location: [facility];  Service: Gynecology;  Laterality: N/A;   COLONOSCOPY  12/14/2022   HYSTEROSCOPY WITH RESECTOSCOPE  11/25/2006   @[facility]  by dr  [name]. [name];   Resectoscopic myomectomy and polypectomy   VAGINECTOMY, PARTIAL N/A 06/30/2023   Procedure: SMALL VAGINECTOMY, PARTIAL BIOSPIES;  Surgeon: Carver Fila, MD;  Location: [facility];  Service: Gynecology;  Laterality: N/A;    Family History  Problem Relation Age of Onset   Hypertension Mother    Diabetes Father    Diabetes Maternal Grandmother    Hypertension Maternal Grandmother    Colon cancer Neg Hx    Colon polyps Neg Hx    Esophageal cancer Neg Hx    Rectal cancer Neg Hx    Stomach cancer Neg Hx    Social History:  reports that she has never smoked. She has never used smokeless tobacco. She reports that she does not drink alcohol and does not use drugs.  Allergies: No Known Allergies  No medications prior to admission.    Review of systems otherwise negative  Last menstrual period 02/18/2022.  Physical exam: Mental status: oriented x3. Eyes: See eye exam associated with this date of surgery Ears, Nose, Throat: within normal limits Neck: Within Normal limits General: within normal limits Chest: Within normal limits Breast: deferred Heart: Within normal limits Abdomen: Within normal limits GU: deferred Extremities: within normal limits Skin: within normal limits  Assessment/Plan 1. Non-clearing vitreous hemorrhage, RIGHT EYE  Plan: To [facility] for 25g PPV OD under general anesthesia - case scheduled for 12.5.24 -- [facility] OR 08  Isaias Cowman.  [name], M.D., Ph.D. Vitreoretinal Surgeon [facility]

## 2023-09-13 NOTE — Progress Notes (Signed)
PCP - [name], PA-C  Cardiologist - denies  PPM/ICD - denies Device Orders - n/a Rep Notified - n/a  Chest x-ray - denies EKG - 06-30-23  Stress Test - denies ECHO - denies Cardiac Cath - denies  CPAP - denies  DM denies  Blood Thinner Instructions: denies Aspirin Instructions: n/a  ERAS Protcol - clear liquids until 8:30 am  COVID TEST- no  Anesthesia review: no  Patient verbally denies any shortness of breath, fever, cough and chest pain during phone call   -------------  SDW INSTRUCTIONS given:  Your procedure is scheduled on September 15, 2023.  Report to [facility] Main Entrance "A" at 9:00 A.M., and check in at the Admitting office.  Call this number if you have problems the morning of surgery:  [phone]   Remember:  Do not eat after midnight the night before your surgery  You may drink clear liquids until 8:30 am the morning of your surgery.   Clear liquids allowed are: Water, Non-Citrus Juices (without pulp), Carbonated Beverages, Clear Tea, Black Coffee Only, and Gatorade    Take these medicines the morning of surgery with A SIP OF WATER  predniSONE (DELTASONE     As of today, STOP taking any Aspirin (unless otherwise instructed by your surgeon) Aleve, Naproxen, Ibuprofen, Motrin, Advil, Goody's, BC's, all herbal medications, fish oil, and all vitamins.                      Do not wear jewelry, make up, or nail polish            Do not wear lotions, powders, perfumes/colognes, or deodorant.            Do not shave 48 hours prior to surgery.  Men may shave face and neck.            Do not bring valuables to the hospital.            [facility] is not responsible for any belongings or valuables.  Do NOT Smoke (Tobacco/Vaping) 24 hours prior to your procedure If you use a CPAP at night, you may bring all equipment for your overnight stay.   Contacts, glasses, dentures or bridgework may not be worn into surgery.      For patients admitted  to the hospital, discharge time will be determined by your treatment team.   Patients discharged the day of surgery will not be allowed to drive home, and someone needs to stay with them for 24 hours.    Special instructions:   [place]- Preparing For Surgery  Before surgery, you can play an important role. Because skin is not sterile, your skin needs to be as free of germs as possible. You can reduce the number of germs on your skin by washing with CHG (chlorahexidine gluconate) Soap before surgery.  CHG is an antiseptic cleaner which kills germs and bonds with the skin to continue killing germs even after washing.    Oral Hygiene is also important to reduce your risk of infection.  Remember - BRUSH YOUR TEETH THE MORNING OF SURGERY WITH YOUR REGULAR TOOTHPASTE  Please do not use if you have an allergy to CHG or antibacterial soaps. If your skin becomes reddened/irritated stop using the CHG.  Do not shave (including legs and underarms) for at least 48 hours prior to first CHG shower. It is OK to shave your face.  Please follow these instructions carefully.   Shower the  NIGHT BEFORE SURGERY and the MORNING OF SURGERY with DIAL Soap.   Pat yourself dry with a CLEAN TOWEL.  Wear CLEAN PAJAMAS to bed the night before surgery  Place CLEAN SHEETS on your bed the night of your first shower and DO NOT SLEEP WITH PETS.   Day of Surgery: Please shower morning of surgery  Wear Clean/Comfortable clothing the morning of surgery Do not apply any deodorants/lotions.   Remember to brush your teeth WITH YOUR REGULAR TOOTHPASTE.   Questions were answered. Patient verbalized understanding of instructions.

## 2023-09-14 ENCOUNTER — Ambulatory Visit (INDEPENDENT_AMBULATORY_CARE_PROVIDER_SITE_OTHER): Payer: Commercial Managed Care - PPO | Admitting: Ophthalmology

## 2023-09-14 ENCOUNTER — Encounter (INDEPENDENT_AMBULATORY_CARE_PROVIDER_SITE_OTHER): Payer: Self-pay | Admitting: Ophthalmology

## 2023-09-14 DIAGNOSIS — H353211 Exudative age-related macular degeneration, right eye, with active choroidal neovascularization: Secondary | ICD-10-CM | POA: Diagnosis not present

## 2023-09-14 DIAGNOSIS — H35711 Central serous chorioretinopathy, right eye: Secondary | ICD-10-CM

## 2023-09-14 DIAGNOSIS — H25813 Combined forms of age-related cataract, bilateral: Secondary | ICD-10-CM

## 2023-09-14 DIAGNOSIS — H4311 Vitreous hemorrhage, right eye: Secondary | ICD-10-CM | POA: Diagnosis not present

## 2023-09-14 DIAGNOSIS — I10 Essential (primary) hypertension: Secondary | ICD-10-CM

## 2023-09-14 DIAGNOSIS — H35033 Hypertensive retinopathy, bilateral: Secondary | ICD-10-CM

## 2023-09-14 DIAGNOSIS — H33103 Unspecified retinoschisis, bilateral: Secondary | ICD-10-CM

## 2023-09-15 ENCOUNTER — Ambulatory Visit (HOSPITAL_COMMUNITY)
Admission: RE | Admit: 2023-09-15 | Discharge: 2023-09-15 | Disposition: A | Discharge status: Home / Self Care | Payer: Commercial Managed Care - PPO | Attending: Ophthalmology | Admitting: Ophthalmology

## 2023-09-15 ENCOUNTER — Ambulatory Visit (HOSPITAL_BASED_OUTPATIENT_CLINIC_OR_DEPARTMENT_OTHER): Payer: Commercial Managed Care - PPO | Admitting: Anesthesiology

## 2023-09-15 ENCOUNTER — Encounter (HOSPITAL_COMMUNITY): Payer: Self-pay | Admitting: Ophthalmology

## 2023-09-15 ENCOUNTER — Ambulatory Visit (HOSPITAL_COMMUNITY): Payer: Commercial Managed Care - PPO | Admitting: Anesthesiology

## 2023-09-15 ENCOUNTER — Other Ambulatory Visit: Payer: Self-pay

## 2023-09-15 ENCOUNTER — Encounter (HOSPITAL_COMMUNITY)
Admission: RE | Disposition: A | Discharge status: Home / Self Care | Payer: Self-pay | Source: Home / Self Care | Attending: Ophthalmology

## 2023-09-15 DIAGNOSIS — H3341 Traction detachment of retina, right eye: Secondary | ICD-10-CM | POA: Diagnosis not present

## 2023-09-15 DIAGNOSIS — H4489 Other disorders of globe: Secondary | ICD-10-CM | POA: Diagnosis not present

## 2023-09-15 DIAGNOSIS — H4311 Vitreous hemorrhage, right eye: Secondary | ICD-10-CM

## 2023-09-15 DIAGNOSIS — Z79899 Other long term (current) drug therapy: Secondary | ICD-10-CM | POA: Insufficient documentation

## 2023-09-15 DIAGNOSIS — I10 Essential (primary) hypertension: Secondary | ICD-10-CM | POA: Insufficient documentation

## 2023-09-15 HISTORY — PX: PARS PLANA VITRECTOMY: SHX2166

## 2023-09-15 HISTORY — PX: INJECTION OF SILICONE OIL: SHX6422

## 2023-09-15 LAB — BASIC METABOLIC PANEL
Anion gap: 12 (ref 5–15)
BUN: 14 mg/dL (ref 6–20)
CO2: 22 mmol/L (ref 22–32)
Calcium: 9.3 mg/dL (ref 8.9–10.3)
Chloride: 102 mmol/L (ref 98–111)
Creatinine, Ser: 0.74 mg/dL (ref 0.44–1.00)
GFR, Estimated: >60 mL/min (ref >60)
Glucose, Bld: 103 mg/dL — ABNORMAL HIGH (ref 70–99)
Potassium: 3.2 mmol/L — ABNORMAL LOW (ref 3.5–5.1)
Sodium: 136 mmol/L (ref 135–145)

## 2023-09-15 LAB — CBC
HCT: 45.1 % (ref 36.0–46.0)
Hemoglobin: 14.5 g/dL (ref 12.0–15.0)
MCH: 28 pg (ref 26.0–34.0)
MCHC: 32.2 g/dL (ref 30.0–36.0)
MCV: 87.2 fL (ref 80.0–100.0)
Platelets: 404 10*3/uL — ABNORMAL HIGH (ref 150–400)
RBC: 5.17 MIL/uL — ABNORMAL HIGH (ref 3.87–5.11)
RDW: 12.6 % (ref 11.5–15.5)
WBC: 8.7 10*3/uL (ref 4.0–10.5)
nRBC: 0 % (ref 0.0–0.2)

## 2023-09-15 SURGERY — PARS PLANA VITRECTOMY WITH 25 GAUGE
Anesthesia: General | Laterality: Right

## 2023-09-15 MED ORDER — BSS IO SOLN
INTRAOCULAR | Status: AC
  Filled 2023-09-15: qty 15

## 2023-09-15 MED ORDER — GATIFLOXACIN 0.5 % OP SOLN
OPHTHALMIC | Status: AC
  Filled 2023-09-15: qty 2.5

## 2023-09-15 MED ORDER — STERILE WATER FOR INJECTION IJ SOLN
INTRAMUSCULAR | Status: DC | PRN
  Administered 2023-09-15: 1 mL

## 2023-09-15 MED ORDER — DORZOLAMIDE HCL-TIMOLOL MAL 2-0.5 % OP SOLN
OPHTHALMIC | Status: AC
  Filled 2023-09-15: qty 10

## 2023-09-15 MED ORDER — ROCURONIUM BROMIDE 10 MG/ML (PF) SYRINGE
PREFILLED_SYRINGE | INTRAVENOUS | Status: DC | PRN
  Administered 2023-09-15: 50 mg via INTRAVENOUS
  Administered 2023-09-15 (×2): 20 mg via INTRAVENOUS
  Administered 2023-09-15: 10 mg via INTRAVENOUS

## 2023-09-15 MED ORDER — BSS PLUS IO SOLN
INTRAOCULAR | Status: AC
  Filled 2023-09-15: qty 500

## 2023-09-15 MED ORDER — TRIAMCINOLONE ACETONIDE 40 MG/ML IJ SUSP
INTRAMUSCULAR | Status: AC
  Filled 2023-09-15: qty 5

## 2023-09-15 MED ORDER — DEXAMETHASONE SODIUM PHOSPHATE 10 MG/ML IJ SOLN
INTRAMUSCULAR | Status: AC
  Filled 2023-09-15: qty 1

## 2023-09-15 MED ORDER — TRIAMCINOLONE ACETONIDE 40 MG/ML IJ SUSP
INTRAMUSCULAR | Status: DC | PRN
  Administered 2023-09-15: 1.3 mL

## 2023-09-15 MED ORDER — ATROPINE SULFATE 1 % OP SOLN
1.0000 [drp] | OPHTHALMIC | Status: AC | PRN
  Administered 2023-09-15 (×3): 1 [drp] via OPHTHALMIC
  Filled 2023-09-15: qty 2

## 2023-09-15 MED ORDER — OXYCODONE HCL 5 MG PO TABS
ORAL_TABLET | ORAL | Status: AC
  Filled 2023-09-15: qty 1

## 2023-09-15 MED ORDER — BACITRACIN-POLYMYXIN B 500-10000 UNIT/GM OP OINT
TOPICAL_OINTMENT | OPHTHALMIC | Status: DC | PRN
  Administered 2023-09-15: 1 via OPHTHALMIC

## 2023-09-15 MED ORDER — BRIMONIDINE TARTRATE 0.2 % OP SOLN
OPHTHALMIC | Status: DC | PRN
  Administered 2023-09-15: 1 [drp] via OPHTHALMIC

## 2023-09-15 MED ORDER — ATROPINE SULFATE 1 % OP SOLN
OPHTHALMIC | Status: AC
Start: 2023-09-15 — End: ?
  Filled 2023-09-15: qty 5

## 2023-09-15 MED ORDER — EPINEPHRINE PF 1 MG/ML IJ SOLN
INTRAMUSCULAR | Status: AC
  Filled 2023-09-15: qty 1

## 2023-09-15 MED ORDER — PROPOFOL 10 MG/ML IV BOLUS
INTRAVENOUS | Status: DC | PRN
  Administered 2023-09-15: 150 mg via INTRAVENOUS

## 2023-09-15 MED ORDER — TROPICAMIDE 1 % OP SOLN
1.0000 [drp] | OPHTHALMIC | Status: AC | PRN
  Administered 2023-09-15 (×3): 1 [drp] via OPHTHALMIC
  Filled 2023-09-15: qty 15

## 2023-09-15 MED ORDER — FENTANYL CITRATE (PF) 100 MCG/2ML IJ SOLN
INTRAMUSCULAR | Status: AC
  Administered 2023-09-15: 50 ug via INTRAVENOUS
  Filled 2023-09-15: qty 2

## 2023-09-15 MED ORDER — LIDOCAINE 2% (20 MG/ML) 5 ML SYRINGE
INTRAMUSCULAR | Status: DC | PRN
  Administered 2023-09-15: 60 mg via INTRAVENOUS

## 2023-09-15 MED ORDER — PREDNISOLONE ACETATE 1 % OP SUSP
OPHTHALMIC | Status: DC | PRN
  Administered 2023-09-15: 1 [drp] via OPHTHALMIC

## 2023-09-15 MED ORDER — BSS IO SOLN
INTRAOCULAR | Status: DC | PRN
  Administered 2023-09-15: 15 mL via INTRAOCULAR

## 2023-09-15 MED ORDER — ORAL CARE MOUTH RINSE: 15.0000 mL | Freq: Once | OROMUCOSAL | Status: AC

## 2023-09-15 MED ORDER — PHENYLEPHRINE 80 MCG/ML (10ML) SYRINGE FOR IV PUSH (FOR BLOOD PRESSURE SUPPORT)
PREFILLED_SYRINGE | INTRAVENOUS | Status: DC | PRN
  Administered 2023-09-15: 80 ug via INTRAVENOUS

## 2023-09-15 MED ORDER — GATIFLOXACIN 0.5 % OP SOLN
OPHTHALMIC | Status: DC | PRN
  Administered 2023-09-15: 1 [drp] via OPHTHALMIC

## 2023-09-15 MED ORDER — ATROPINE SULFATE 1 % OP SOLN
OPHTHALMIC | Status: DC | PRN
  Administered 2023-09-15: 1 [drp] via OPHTHALMIC

## 2023-09-15 MED ORDER — NA CHONDROIT SULF-NA HYALURON 40-30 MG/ML IO SOSY
INTRAOCULAR | Status: DC | PRN
  Administered 2023-09-15: .5 mL via INTRAOCULAR

## 2023-09-15 MED ORDER — OXYCODONE HCL 5 MG PO TABS
5.0000 mg | ORAL_TABLET | Freq: Once | ORAL | Status: AC | PRN
  Administered 2023-09-15: 5 mg via ORAL

## 2023-09-15 MED ORDER — FENTANYL CITRATE (PF) 100 MCG/2ML IJ SOLN
25.0000 ug | INTRAMUSCULAR | Status: DC | PRN
  Administered 2023-09-15: 50 ug via INTRAVENOUS

## 2023-09-15 MED ORDER — AMISULPRIDE (ANTIEMETIC) 5 MG/2ML IV SOLN: 10.0000 mg | Freq: Once | INTRAVENOUS | Status: DC | PRN

## 2023-09-15 MED ORDER — STERILE WATER FOR INJECTION IJ SOLN
INTRAMUSCULAR | Status: DC | PRN
  Administered 2023-09-15: 10 mL

## 2023-09-15 MED ORDER — DORZOLAMIDE HCL-TIMOLOL MAL 2-0.5 % OP SOLN
OPHTHALMIC | Status: DC | PRN
  Administered 2023-09-15: 1 [drp] via OPHTHALMIC

## 2023-09-15 MED ORDER — SODIUM CHLORIDE (PF) 0.9 % IJ SOLN
INTRAMUSCULAR | Status: AC
  Filled 2023-09-15: qty 10

## 2023-09-15 MED ORDER — BACITRACIN-POLYMYXIN B 500-10000 UNIT/GM OP OINT
TOPICAL_OINTMENT | OPHTHALMIC | Status: AC
  Filled 2023-09-15: qty 3.5

## 2023-09-15 MED ORDER — FENTANYL CITRATE (PF) 250 MCG/5ML IJ SOLN
INTRAMUSCULAR | Status: DC | PRN
  Administered 2023-09-15 (×3): 50 ug via INTRAVENOUS

## 2023-09-15 MED ORDER — BRIMONIDINE TARTRATE 0.2 % OP SOLN
OPHTHALMIC | Status: AC
  Filled 2023-09-15: qty 5

## 2023-09-15 MED ORDER — LIDOCAINE HCL 1 % IJ SOLN
INTRAMUSCULAR | Status: AC
  Filled 2023-09-15: qty 20

## 2023-09-15 MED ORDER — DEXAMETHASONE SODIUM PHOSPHATE 10 MG/ML IJ SOLN
INTRAMUSCULAR | Status: DC | PRN
  Administered 2023-09-15: 10 mg via INTRAVENOUS

## 2023-09-15 MED ORDER — ACETAZOLAMIDE SODIUM 500 MG IJ SOLR
INTRAMUSCULAR | Status: AC
  Filled 2023-09-15: qty 500

## 2023-09-15 MED ORDER — PREDNISOLONE ACETATE 1 % OP SUSP
OPHTHALMIC | Status: AC
  Filled 2023-09-15: qty 5

## 2023-09-15 MED ORDER — POLYMYXIN B SULFATE 500000 UNITS IJ SOLR
INTRAMUSCULAR | Status: AC
  Filled 2023-09-15: qty 10

## 2023-09-15 MED ORDER — BUPIVACAINE HCL (PF) 0.75 % IJ SOLN
INTRAMUSCULAR | Status: AC
  Filled 2023-09-15: qty 10

## 2023-09-15 MED ORDER — ONDANSETRON HCL 4 MG/2ML IJ SOLN
INTRAMUSCULAR | Status: DC | PRN
  Administered 2023-09-15: 4 mg via INTRAVENOUS

## 2023-09-15 MED ORDER — STERILE WATER FOR INJECTION IJ SOLN
INTRAMUSCULAR | Status: AC
  Filled 2023-09-15: qty 10

## 2023-09-15 MED ORDER — SODIUM CHLORIDE 0.9 % IV SOLN: INTRAVENOUS | Status: DC

## 2023-09-15 MED ORDER — MIDAZOLAM HCL 2 MG/2ML IJ SOLN
INTRAMUSCULAR | Status: AC
  Filled 2023-09-15: qty 2

## 2023-09-15 MED ORDER — SUGAMMADEX SODIUM 200 MG/2ML IV SOLN
INTRAVENOUS | Status: DC | PRN
  Administered 2023-09-15: 200 mg via INTRAVENOUS

## 2023-09-15 MED ORDER — PROPOFOL 10 MG/ML IV BOLUS
INTRAVENOUS | Status: AC
  Filled 2023-09-15: qty 20

## 2023-09-15 MED ORDER — CEFTAZIDIME 1 G IJ SOLR
INTRAMUSCULAR | Status: AC
  Filled 2023-09-15: qty 1

## 2023-09-15 MED ORDER — OXYCODONE HCL 5 MG/5ML PO SOLN: 5.0000 mg | Freq: Once | ORAL | Status: AC | PRN

## 2023-09-15 MED ORDER — FENTANYL CITRATE (PF) 250 MCG/5ML IJ SOLN
INTRAMUSCULAR | Status: AC
  Filled 2023-09-15: qty 5

## 2023-09-15 MED ORDER — SODIUM CHLORIDE 0.9 % IV SOLN: 12.5000 mg | INTRAVENOUS | Status: DC | PRN

## 2023-09-15 MED ORDER — CHLORHEXIDINE GLUCONATE 0.12 % MT SOLN
15.0000 mL | Freq: Once | OROMUCOSAL | Status: AC
  Administered 2023-09-15: 15 mL via OROMUCOSAL
  Filled 2023-09-15: qty 15

## 2023-09-15 MED ORDER — ACETAMINOPHEN 500 MG PO TABS
1000.0000 mg | ORAL_TABLET | Freq: Once | ORAL | Status: AC
  Administered 2023-09-15: 1000 mg via ORAL
  Filled 2023-09-15: qty 2

## 2023-09-15 MED ORDER — MIDAZOLAM HCL 2 MG/2ML IJ SOLN
INTRAMUSCULAR | Status: DC | PRN
  Administered 2023-09-15: 2 mg via INTRAVENOUS

## 2023-09-15 MED ORDER — EPINEPHRINE PF 1 MG/ML IJ SOLN: INTRAOCULAR | Status: DC | PRN

## 2023-09-15 MED ORDER — NA CHONDROIT SULF-NA HYALURON 40-30 MG/ML IO SOSY
INTRAOCULAR | Status: AC
  Filled 2023-09-15: qty 1

## 2023-09-15 MED ORDER — PROPARACAINE HCL 0.5 % OP SOLN
1.0000 [drp] | OPHTHALMIC | Status: AC | PRN
  Administered 2023-09-15 (×3): 1 [drp] via OPHTHALMIC
  Filled 2023-09-15: qty 15

## 2023-09-15 MED ORDER — SODIUM CHLORIDE (PF) 0.9 % IJ SOLN
INTRAMUSCULAR | Status: DC | PRN
  Administered 2023-09-15: 10 mL

## 2023-09-15 MED ORDER — CARBACHOL 0.01 % IO SOLN
INTRAOCULAR | Status: AC
  Filled 2023-09-15: qty 1.5

## 2023-09-15 MED ORDER — PHENYLEPHRINE HCL 10 % OP SOLN
1.0000 [drp] | OPHTHALMIC | Status: AC | PRN
  Administered 2023-09-15 (×3): 1 [drp] via OPHTHALMIC
  Filled 2023-09-15: qty 5

## 2023-09-15 SURGICAL SUPPLY — 64 items
APPLICATOR COTTON TIP 6 STRL (MISCELLANEOUS) ×4 IMPLANT
APPLICATOR COTTON TIP 6IN STRL (MISCELLANEOUS) ×4 IMPLANT
BAND WRIST GAS GREEN (MISCELLANEOUS) IMPLANT
BLADE EYE CATARACT 19 1.4 BEAV (BLADE) IMPLANT
BNDG EYE OVAL 2 1/8 X 2 5/8 (GAUZE/BANDAGES/DRESSINGS) ×1 IMPLANT
CABLE BIPOLOR RESECTION CORD (MISCELLANEOUS) ×1 IMPLANT
CANNULA ANT CHAM MAIN (OPHTHALMIC RELATED) IMPLANT
CANNULA DUALBORE 25G (CANNULA) ×1 IMPLANT
CANNULA FLEX TIP 25G (CANNULA) ×1 IMPLANT
CANNULA TROCAR 25 GA VLV (OPHTHALMIC) ×1 IMPLANT
CANNULA TROCAR 25G 4 VLV (OPHTHALMIC) IMPLANT
CANNULA TROCAR 25G 6 VLV (OPHTHALMIC) IMPLANT
CANNULA TROCAR 25GA VLV (OPHTHALMIC) ×1 IMPLANT
CLSR STERI-STRIP ANTIMIC 1/2X4 (GAUZE/BANDAGES/DRESSINGS) ×1 IMPLANT
DRAPE INCISE 51X51 W/FILM STRL (DRAPES) ×1 IMPLANT
DRAPE MICROSCOPE LEICA 46X105 (MISCELLANEOUS) ×1 IMPLANT
DRAPE OPHTHALMIC 77X100 STRL (CUSTOM PROCEDURE TRAY) ×1 IMPLANT
FILTER BLUE MILLIPORE (MISCELLANEOUS) IMPLANT
FILTER STRAW FLUID ASPIR (MISCELLANEOUS) IMPLANT
FORCEPS GRIESHABER ILM 25G A (INSTRUMENTS) IMPLANT
FORCEPS GRIESHABER MAX 25G (MISCELLANEOUS) IMPLANT
GAS AUTO FILL CONSTEL (OPHTHALMIC) IMPLANT
GAS AUTO FILL CONSTELLATION (OPHTHALMIC) IMPLANT
GLOVE BIO SURGEON STRL SZ7.5 (GLOVE) ×2 IMPLANT
GLOVE BIOGEL M 7.0 STRL (GLOVE) ×1 IMPLANT
GOWN STRL REUS W/ TWL LRG LVL3 (GOWN DISPOSABLE) ×2 IMPLANT
GOWN STRL REUS W/ TWL XL LVL3 (GOWN DISPOSABLE) ×1 IMPLANT
KIT BASIN OR (CUSTOM PROCEDURE TRAY) ×1 IMPLANT
KIT PERFLUORON PROCEDURE 5ML (MISCELLANEOUS) IMPLANT
LENS BIOM SUPER VIEW SET DISP (MISCELLANEOUS) IMPLANT
LENS VITRECTOMY FLAT OCLR DISP (MISCELLANEOUS) IMPLANT
LOOP FINESSE 25 GA (MISCELLANEOUS) IMPLANT
MICROPICK 25G (MISCELLANEOUS) IMPLANT
NDL 18GX1X1/2 (RX/OR ONLY) (NEEDLE) ×2 IMPLANT
NDL 25GX 5/8IN NON SAFETY (NEEDLE) ×1 IMPLANT
NDL FILTER BLUNT 18X1 1/2 (NEEDLE) ×1 IMPLANT
NDL HYPO 30X.5 LL (NEEDLE) ×2 IMPLANT
NEEDLE 18GX1X1/2 (RX/OR ONLY) (NEEDLE) ×2 IMPLANT
NEEDLE 25GX 5/8IN NON SAFETY (NEEDLE) ×1 IMPLANT
NEEDLE FILTER BLUNT 18X1 1/2 (NEEDLE) ×1 IMPLANT
NEEDLE HYPO 30X.5 LL (NEEDLE) ×2 IMPLANT
NS IRRIG 1000ML POUR BTL (IV SOLUTION) ×1 IMPLANT
OIL SILICONE OPHTHALMIC 1000 (Ophthalmic Related) IMPLANT
PACK VITRECTOMY CUSTOM (CUSTOM PROCEDURE TRAY) ×1 IMPLANT
PAD ARMBOARD 7.5X6 YLW CONV (MISCELLANEOUS) ×2 IMPLANT
PAK PIK VITRECTOMY CVS 25GA (OPHTHALMIC) ×1 IMPLANT
PENCIL BIPOLAR 25GA STR DISP (OPHTHALMIC RELATED) IMPLANT
PICK MICROPICK 25G (MISCELLANEOUS) IMPLANT
PROBE ENDO DIATHERMY 25G (MISCELLANEOUS) IMPLANT
PROBE LASER ILLUM FLEX CVD 25G (OPHTHALMIC) IMPLANT
REPL STRA BRUSH NDL (NEEDLE) IMPLANT
REPL STRA BRUSH NEEDLE (NEEDLE) IMPLANT
RESERVOIR BACK FLUSH (MISCELLANEOUS) IMPLANT
RETRACTOR IRIS FLEX 25G GRIESH (INSTRUMENTS) IMPLANT
SCISSORS TIP ADVANCED DSP 25GA (INSTRUMENTS) IMPLANT
SET INJECTOR OIL FLUID CONSTEL (OPHTHALMIC) IMPLANT
SHIELD EYE LENSE ONLY DISP (GAUZE/BANDAGES/DRESSINGS) IMPLANT
SUT VICRYL 7 0 TG140 8 (SUTURE) ×1 IMPLANT
SYR 10ML LL (SYRINGE) ×2 IMPLANT
SYR TB 1ML LUER SLIP (SYRINGE) ×2 IMPLANT
TOWEL GREEN STERILE FF (TOWEL DISPOSABLE) ×1 IMPLANT
TRAY FOLEY W/BAG SLVR 14FR (SET/KITS/TRAYS/PACK) IMPLANT
TUBING HIGH PRESS EXTEN 6IN (TUBING) ×1 IMPLANT
WATER STERILE IRR 1000ML POUR (IV SOLUTION) ×1 IMPLANT

## 2023-09-15 NOTE — Discharge Instructions (Addendum)
POSTOPERATIVE INSTRUCTIONS  Your doctor has performed vitreoretinal surgery on you at [facility]. [facility].  - Keep eye patched and shielded until seen by Dr. [name] 8 AM tomorrow in clinic - Do not use drops until return - FACE DOWN POSITIONING WHILE AWAKE - Sleep with belly down or on LEFT side, avoid laying flat on back.    - No strenuous bending, stooping or lifting.  - You may not drive until further notice.  - Tylenol or any other over-the-counter pain reliever can be used according to your doctor. If more pain medicine is required, your doctor will have a prescription for you.  - You may read, go up and down stairs, and watch television.     [name], M.D., Ph.D.

## 2023-09-15 NOTE — Op Note (Signed)
Date of procedure:  09/15/2023   Surgeon: [name], MD, PhD   Assistant: Laurian Brim, OA   Pre-operative Diagnoses:  Vitreous hemorrhage, RIGHT EYE  Intraocular inflammation, RIGHT EYE  Post-operative diagnosis:  Vitreous hemorrhage, RIGHT EYE  Intraocular inflammation, RIGHT EYE Tractional retinal detachment, RIGHT EYE   Anesthesia: General   Procedure:   1. 25 gauge pars plana vitrectomy, Right Eye 2. Undiluted and diluted vitreous sample collection, Right Eye 3. Preretinal membrane peel, Right Eye 4. Perfluoron injection, Right Eye 5. Endolaser, Right Eye 6. Injection of 1000cs silicon oil, Right Eye    Indications for procedure: This is a 52 yo F with a complex ophthalmic history. In short, she presented acutely with decreased vision OD on 10.24.24. Pt was s/p laser retinopexy OD for retinoshisis on 10.07.24, then developed new floaters and "sparks" in vision OD on 10.16.24. On exam on 10.24.24, pt was found to have a diffuse vitreous hemorrhage in the right eye, causing her visual acuity OD to drop to 20/250. Medical management failed to fully clear the vitreous hemorrhage and significantly improve the vision, and her vision continued to decline. After discussing the risks, benefits, and alternatives to surgery, the patient electively decided to proceed with surgery. The surgery was an attempt to  to clear the vitreous hemorrhage and debris from the right eye and potentially improve the vision within the reasonable expectations of the surgeon.    Procedure in Detail:    The patient was met in the pre-operative holding area where their identification data was verified.  It was noted that there was a signed, informed consent in the chart and the RIGHT eye was verbally verified by the patient for surgery and marked with the surgeon's initials. The patient was then taken to the operating room and placed in the supine position. General endotracheal anesthesia was induced.   The  RIGHT EYE was prepped with 5% betadine and draped in the normal fashion for ophthalmic surgery. The microscope was draped and swung into position, and a secondary time-out was performed to identify the correct patient, eyes, procedures, and any allergies.   A 25 gauge trocar was inserted in a 30-45 degrees fashion into the inferotemporal quadrant 4 mm posterior to the limbus in this phakic patient. Correct positioning within the vitreous was verified externally with the light pipe. The infusion was then connected to the cannula, but the BSS infusion was kept closed.  Additional ports were placed in the superonasal and superotemporal quadrants. Viscoat was placed on the cornea. An undiluted vitreous sample was collected via a 3 cc syringe attached to the vitreous cutter for gram stain and culture.  The BIOM was used to visualize the posterior segment. The patient had very dense vitreous haze / inflammatory material, obscuring the posterior pole. A core vitrectomy was performed using the BIOM visualization system, vitrectomy probe and light pipe. Kenalog was used to mark the vitreous and assist in dissecting vitreous membranes. Of note, once we were able to clear through the vitreous haze and debris, the retina had significant peripheral preretinal / tractional fibrosis with star-folds at the 0730 and 0900 midzone positions, causing a tractional retinal detachment. Inspection of the posterior pole showed that the entire retina was detached -- the nasal hemisphere had very shallow subretinal fluid, while the temporal hemisphere had more subretinal fluid under the tractional membranes and fibrosis.   End-grasping ILM forceps and the vitrectomy probe were used to try to peel and dissect as much of the preretinal membranes  at the starfolds as was deemed safe. Some preretinal membranes were able to be removed, relieving some of the traction on the retina. However, there was a significant portion of fibrosis ingrained  into the retina, that could not be peeled without causing damage to the retina, and were left.   No obvious retinal tears were noted, but when we attempted a fluid-air exchange, the entire retina began to detach more bullously. Thus we reinfused the vitreous cavity with BSS and then used perfluoron to flatten the retina and push the subretinal fluid anteriorly. Under perfluoron, we applied endolaser to the attached retina. A small retinotomy was created over the displaced subretinal fluid at the 0600 oclock peripheral position and subretinal fluid was drained using a soft-tipped extrusion cannula. Next, we repeated a fluid-air exchange over the 6 oclock retinotomy to remove the BSS, the subretinal fluid and then the posterior perfluoron. The retina flattened and then endolaser was applied to the peripheral retina and around the retinotomy. The retina was completely dried using the extrusion cannula.  Next, 1000 cs silicon oil was injected via the superior temporal trocar and filled up to the level of the lens plane with the aide of the venting cannula. The superonasal, superotemporal and infusion ports were removed and were sutured with 7-0 vicryl.  There was no leakage from the sclerotomy sites.   Subconjunctival injections of kefzol + bacitracin + polymixin b and kenalog were then administered, and antibiotic and steroid drops as well as antibiotic ointment were placed in the eye.  The drapes were removed and the eye was patched and shielded.  The patient was then taken to the post-operative area for recovery having tolerated the procedure well.  She was instructed to perform face down positioning postoperatively and to follow up in clinic the following morning as scheduled.   Estimate blood lost: none Complications: None

## 2023-09-15 NOTE — Anesthesia Procedure Notes (Signed)
Procedure Name: Intubation Date/Time: 09/15/2023 11:34 AM  Performed by: Loleta Rukiya Hodgkins, CRNAPre-anesthesia Checklist: Patient identified, Patient being monitored, Timeout performed, Emergency Drugs available and Suction available Patient Re-evaluated:Patient Re-evaluated prior to induction Oxygen Delivery Method: Circle system utilized Preoxygenation: Pre-oxygenation with 100% oxygen Induction Type: IV induction Ventilation: Mask ventilation without difficulty Laryngoscope Size: Miller and 2 Grade View: Grade I Tube type: Oral Tube size: 7.0 mm Number of attempts: 2 Airway Equipment and Method: Stylet Placement Confirmation: ETT inserted through vocal cords under direct vision, positive ETCO2 and breath sounds checked- equal and bilateral Secured at: 22 cm Tube secured with: Tape Dental Injury: Teeth and Oropharynx as per pre-operative assessment

## 2023-09-15 NOTE — Brief Op Note (Signed)
09/15/2023  3:10 PM  PATIENT:  [name]  52 y.o. female  PRE-OPERATIVE DIAGNOSIS:  vitreous hemorrhage, right eye  POST-OPERATIVE DIAGNOSIS:  vitreous hemorrhage, right eye Tractional retinal detachment, right eye  PROCEDURE:  Procedure(s): PARS PLANA VITRECTOMY WITH 25 GAUGE (Right) INJECTION OF SILICONE OIL (Right)  SURGEON:  Surgeons and Role:    [name], MD - Primary  ASSISTANTS: Laurian Brim, Ophthalmic Assistant    ANESTHESIA:   general  EBL:  5 mL   BLOOD ADMINISTERED:none  DRAINS: none   LOCAL MEDICATIONS USED:  NONE  SPECIMEN:  Source of Specimen:  RIGHT EYE VITREOUS and Aspirate  DISPOSITION OF SPECIMEN:   Microbiology  COUNTS:  YES  TOURNIQUET:  * No tourniquets in log *  DICTATION: .Note written in EPIC  PLAN OF CARE: Discharge to home after PACU  PATIENT DISPOSITION:  PACU - hemodynamically stable.   Delay start of Pharmacological VTE agent (>24hrs) due to surgical blood loss or risk of bleeding: not applicable

## 2023-09-15 NOTE — Anesthesia Preprocedure Evaluation (Addendum)
Anesthesia Evaluation  Patient identified by MRN, date of birth, ID band Patient awake    Reviewed: Allergy & Precautions, NPO status , Patient's Chart, lab work & pertinent test results  History of Anesthesia Complications Negative for: history of anesthetic complications  Airway Mallampati: II  TM Distance: >3 FB Neck ROM: Full    Dental  (+) Dental Advisory Given, Loose,    Pulmonary neg pulmonary ROS   Pulmonary exam normal        Cardiovascular hypertension, Pt. on medications Normal cardiovascular exam     Neuro/Psych  Headaches PSYCHIATRIC DISORDERS Anxiety Depression     Neuromuscular disease    GI/Hepatic negative GI ROS, Neg liver ROS,,,  Endo/Other  negative endocrine ROS    Renal/GU negative Renal ROS     Musculoskeletal negative musculoskeletal ROS (+)    Abdominal   Peds  Hematology negative hematology ROS (+)   Anesthesia Other Findings   Reproductive/Obstetrics                             Anesthesia Physical Anesthesia Plan  ASA: 2  Anesthesia Plan: General   Post-op Pain Management: Tylenol PO (pre-op)*   Induction: Intravenous  PONV Risk Score and Plan: 3 and Treatment may vary due to age or medical condition, Ondansetron and Midazolam  Airway Management Planned: Oral ETT  Additional Equipment: None  Intra-op Plan:   Post-operative Plan: Extubation in OR  Informed Consent: I have reviewed the patients History and Physical, chart, labs and discussed the procedure including the risks, benefits and alternatives for the proposed anesthesia with the patient or authorized representative who has indicated his/her understanding and acceptance.     Dental advisory given and Interpreter used for interview  Plan Discussed with: CRNA and Anesthesiologist  Anesthesia Plan Comments:         Anesthesia Quick Evaluation

## 2023-09-15 NOTE — Transfer of Care (Signed)
Immediate Anesthesia Transfer of Care Note  Patient: [name]  Procedure(s) Performed: PARS PLANA VITRECTOMY WITH 25 GAUGE (Right)  Patient Location: PACU  Anesthesia Type:General  Level of Consciousness: awake and alert   Airway & Oxygen Therapy: Patient Spontanous Breathing and Patient connected to face mask oxygen  Post-op Assessment: Report given to RN and Post -op Vital signs reviewed and stable  Post vital signs: Reviewed and stable  Last Vitals:  Vitals Value Taken Time  BP    Temp    Pulse    Resp    SpO2      Last Pain:  Vitals:   09/15/23 0928  TempSrc:   PainSc: 3       Patients Stated Pain Goal: 1 (09/15/23 0928)  Complications: No notable events documented.

## 2023-09-15 NOTE — Interval H&P Note (Signed)
History and Physical Interval Note:  09/15/2023 11:16 AM  [name]  has presented today for surgery, with the diagnosis of vitreous hemorrhage, right eye.  The various methods of treatment have been discussed with the patient and family. After consideration of risks, benefits and other options for treatment, the patient has consented to  Procedure(s): PARS PLANA VITRECTOMY WITH 25 GAUGE (Right) as a surgical intervention.  The patient's history has been reviewed, patient examined, no change in status, stable for surgery.  I have reviewed the patient's chart and labs.  Questions were answered to the patient's satisfaction.     [name]

## 2023-09-16 ENCOUNTER — Encounter (HOSPITAL_COMMUNITY): Payer: Self-pay | Admitting: Ophthalmology

## 2023-09-16 ENCOUNTER — Ambulatory Visit (INDEPENDENT_AMBULATORY_CARE_PROVIDER_SITE_OTHER): Payer: Commercial Managed Care - PPO | Admitting: Ophthalmology

## 2023-09-16 DIAGNOSIS — H3341 Traction detachment of retina, right eye: Secondary | ICD-10-CM | POA: Diagnosis not present

## 2023-09-16 DIAGNOSIS — H5789 Other specified disorders of eye and adnexa: Secondary | ICD-10-CM

## 2023-09-16 DIAGNOSIS — H33103 Unspecified retinoschisis, bilateral: Secondary | ICD-10-CM

## 2023-09-16 DIAGNOSIS — H3521 Other non-diabetic proliferative retinopathy, right eye: Secondary | ICD-10-CM

## 2023-09-16 DIAGNOSIS — H353211 Exudative age-related macular degeneration, right eye, with active choroidal neovascularization: Secondary | ICD-10-CM

## 2023-09-16 DIAGNOSIS — H25813 Combined forms of age-related cataract, bilateral: Secondary | ICD-10-CM

## 2023-09-16 DIAGNOSIS — H35033 Hypertensive retinopathy, bilateral: Secondary | ICD-10-CM

## 2023-09-16 DIAGNOSIS — H4311 Vitreous hemorrhage, right eye: Secondary | ICD-10-CM

## 2023-09-16 DIAGNOSIS — H35711 Central serous chorioretinopathy, right eye: Secondary | ICD-10-CM

## 2023-09-16 DIAGNOSIS — I10 Essential (primary) hypertension: Secondary | ICD-10-CM

## 2023-09-16 LAB — TOXOPLASMA ANTIBODIES- IGG AND  IGM
Toxoplasma Antibody- IgM: <3 [AU]/ml (ref 0.0–7.9)
Toxoplasma IgG Ratio: <3 [IU]/mL (ref 0.0–7.1)

## 2023-09-16 NOTE — Anesthesia Postprocedure Evaluation (Signed)
Anesthesia Post Note  Patient: [name]  Procedure(s) Performed: PARS PLANA VITRECTOMY WITH 25 GAUGE (Right) INJECTION OF SILICONE OIL (Right)     Patient location during evaluation: PACU Anesthesia Type: General Level of consciousness: awake and alert Pain management: pain level controlled Vital Signs Assessment: post-procedure vital signs reviewed and stable Respiratory status: spontaneous breathing, nonlabored ventilation, respiratory function stable and patient connected to nasal cannula oxygen Cardiovascular status: blood pressure returned to baseline and stable Postop Assessment: no apparent nausea or vomiting Anesthetic complications: no   No notable events documented.  Last Vitals:  Vitals:   09/15/23 1510 09/15/23 1515  BP:  112/74  Pulse: 78 73  Resp: (!) 22 14  Temp:  36.7 C  SpO2: 100% 95%    Last Pain:  Vitals:   09/15/23 1510  TempSrc:   PainSc: 6                  Thaddeus Evitts S

## 2023-09-16 NOTE — Progress Notes (Signed)
[facility] - Clinic Note  09/16/2023     CHIEF COMPLAINT Patient presents for Retina Follow Up   HISTORY OF PRESENT ILLNESS: [name] is a 52 y.o. female who presents to the clinic today for:  HPI     Retina Follow Up   Patient presents with  Retinal Break/Detachment.  In right eye.  Severity is moderate.  Duration of 1 day.  Since onset it is stable.  I, the attending physician,  performed the HPI with the patient and updated documentation appropriately.        Comments   Pt here for 1 day post op s/p PPV w/ oil OD. Pt states she's doing well this am, slept pretty good. Some pain OD.        Last edited by Thompson Grayer, COT on 09/16/2023  8:21 AM.    Pt states last night went okay, she was able to rest some  Referring physician: [name], PA-C [address] [place],  [place] [address]  HISTORICAL INFORMATION:   Selected notes from the MEDICAL RECORD NUMBER Referred by Dr. [name] for concern of sub-macular fluid / retinoschisis   CURRENT MEDICATIONS: Current Outpatient Medications (Ophthalmic Drugs)  Medication Sig   Bromfenac Sodium 0.09 % SOLN Apply 1 drop to eye in the morning, at noon, in the evening, and at bedtime. 1 drop in the right eye four times a day   prednisoLONE acetate (PRED FORTE) 1 % ophthalmic suspension Place 1 drop into the right eye every hour while awake.   No current facility-administered medications for this visit. (Ophthalmic Drugs)   Current Outpatient Medications (Other)  Medication Sig   diphenhydrAMINE HCl (BENADRYL ALLERGY PO) Take 25 mg by mouth daily as needed (allergies).   eplerenone (INSPRA) 50 MG tablet Take 50 mg by mouth at bedtime.   hydrochlorothiazide (HYDRODIURIL) 25 MG tablet Take 25 mg by mouth daily.   lisinopril (PRINIVIL,ZESTRIL) 20 MG tablet Take 1 tablet (20 mg total) by mouth daily.   predniSONE (DELTASONE) 20 MG tablet Take 2 tablets (40 mg  total) by mouth daily with breakfast.   progesterone (PROMETRIUM) 100 MG capsule Take 1 capsule (100 mg total) by mouth at bedtime.   No current facility-administered medications for this visit. (Other)   REVIEW OF SYSTEMS: ROS   Positive for: Cardiovascular, Eyes Negative for: Constitutional, Gastrointestinal, Neurological, Skin, Genitourinary, Musculoskeletal, HENT, Endocrine, Respiratory, Psychiatric, Allergic/Imm, Heme/Lymph Last edited by Thompson Grayer, COT on 09/16/2023  8:00 AM.     ALLERGIES No Known Allergies  PAST MEDICAL HISTORY 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   followed by dr [name];  pt takes  inspra for excess fluid in retina,   Sciatic nerve injury    Seasonal allergies    Vaginal dysplasia    Wears glasses    Past Surgical History:  Procedure Laterality Date   CERVICAL CONIZATION W/BX N/A 06/30/2023   Procedure: CONIZATION CERVIX WITH BIOPSY, ENDOCERVICAL CURETTAGE;  Surgeon: Carver Fila, MD;  Location: [facility];  Service:  Gynecology;  Laterality: N/A;   CO2 LASER APPLICATION N/A 06/30/2023   Procedure: CO2 LASER TO THE VAGINA;  Surgeon: Carver Fila, MD;  Location: [facility];  Service: Gynecology;  Laterality: N/A;   COLONOSCOPY  12/14/2022   HYSTEROSCOPY WITH RESECTOSCOPE  11/25/2006   @[facility]  by dr [name]. [name];   Resectoscopic myomectomy and polypectomy   INJECTION OF SILICONE OIL Right 09/15/2023   Procedure: INJECTION OF SILICONE OIL;  Surgeon: [name], MD;  Location: [facility] OR;  Service: Ophthalmology;  Laterality: Right;   PARS PLANA VITRECTOMY Right 09/15/2023    Procedure: PARS PLANA VITRECTOMY WITH 25 GAUGE;  Surgeon: [name], MD;  Location: [facility] OR;  Service: Ophthalmology;  Laterality: Right;   VAGINECTOMY, PARTIAL N/A 06/30/2023   Procedure: SMALL VAGINECTOMY, PARTIAL BIOSPIES;  Surgeon: Carver Fila, MD;  Location: [facility];  Service: Gynecology;  Laterality: N/A;   FAMILY HISTORY Family History  Problem Relation Age of Onset   Hypertension Mother    Diabetes Father    Diabetes Maternal Grandmother    Hypertension Maternal Grandmother    Colon cancer Neg Hx    Colon polyps Neg Hx    Esophageal cancer Neg Hx    Rectal cancer Neg Hx    Stomach cancer Neg Hx    SOCIAL HISTORY Social History   Tobacco Use   Smoking status: Never   Smokeless tobacco: Never  Vaping Use   Vaping status: Never Used  Substance Use Topics   Alcohol use: No    Alcohol/week: 0.0 standard drinks of alcohol   Drug use: Never       OPHTHALMIC EXAM: Base Eye Exam     Visual Acuity (Snellen - Linear)       Right Left   Dist [place] HM 20/20 -2   Dist ph [place] NI     Correction: Glasses         Tonometry (Tonopen, 8:09 AM)       Right Left   Pressure 11 13         Pupils       Dark Light Shape React APD   Right Dilated       Left 3 2 Round Brisk None         Visual Fields       Left Right    Full    Restrictions  Total superior nasal, inferior nasal deficiencies; Partial inner superior temporal, inferior temporal deficiencies         Extraocular Movement       Right Left    Full, Ortho Full, Ortho         Neuro/Psych     Oriented x3: Yes   Mood/Affect: Normal         Dilation     Right eye: 1.0% Mydriacyl, 2.5% Phenylephrine @ 8:09 AM           Slit Lamp and Fundus Exam     Slit Lamp Exam       Right Left   Lids/Lashes Dermatochalasis - upper lid Dermatochalasis - upper lid   Conjunctiva/Sclera nasal pterygium with +vessels, sutures intact, mild Subconjunctival hemorrhage 1+  Injection, nasal Pinguecula   Cornea Nasal ptergyium extending 2.60mm onto nasal cornea, PEE, 2+ Descemet's folds 1+Punctate epithelial erosions   Anterior Chamber moderate depth, 2-3+ Cell deep, narrow temporal angle, 1+pigment   Iris Irregular pupil, scattered posterior synechia Round and dilated   Lens 2-3+ Nuclear sclerosis, 2+ Cortical cataract, +pigment  deposition on anterior capsule (broken synechia) 2+ Nuclear sclerosis, 2+ Cortical cataract   Anterior Vitreous post vitrectomy, good silicone oil fill Mild Vitreous syneresis         Fundus Exam       Right Left   Disc Pink and Sharp    C/D Ratio 0.3 0.6   Macula tracdtional fibrosis temporal mac; Shallow SRF under oil    Vessels attenuated; fibrosis    Periphery 360 laser, +SRF temporal periphery with +fibrosis            Refraction     Wearing Rx       Sphere Cylinder Axis   Right +1.25 +1.25 112   Left +2.25 +1.00 101           IMAGING AND PROCEDURES  Imaging and Procedures for @TODAY @  OCT, Retina - OU - Both Eyes       Right Eye Quality was good. Central Foveal Thickness: 721. Findings include epiretinal membrane, subretinal fluid (+SRF, thickened retina).   Left Eye Quality was good. Central Foveal Thickness: 297. Progression has been stable. Findings include normal foveal contour, no IRF, subretinal fluid, vitreomacular adhesion (Small pocket of SRF IN to disc caught on widefield -- persistent, bullous schisis cavity IT periphery caught on widefield ).   Notes *Images captured and stored on drive  Diagnosis / Impression:  OD: +SRF, thickened retina OS: Small pocket of SRF IN to disc caught on widefield -- persistent, bullous schisis cavity IT periphery caught on widefield   Clinical management:  See below  Abbreviations: NFP - Normal foveal profile. CME - cystoid macular edema. PED - pigment epithelial detachment. IRF - intraretinal fluid. SRF - subretinal fluid. EZ - ellipsoid zone. ERM -  epiretinal membrane. ORA - outer retinal atrophy. ORT - outer retinal tubulation. SRHM - subretinal hyper-reflective material             ASSESSMENT/PLAN:    ICD-10-CM   1. Ocular inflammation  H57.89     2. 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 Traction detachment of right retina  H33.41 OCT, Retina - OU - Both Eyes    4. 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 Essential hypertension  I10     8. Hypertensive retinopathy of both eyes  H35.033     9. Combined forms of age-related cataract of both eyes  H25.813      1-3. Ocular inflammation and PVR retinal detachment,  Vitreous hemorrhage OD - started Wednesday, 10.16.24, by pt history - pt seeing new floaters and "sparks" in vision - BCVA improved to HM from NLP post op - exam shows vit haze and white, blood stained vit condensations -- mobile, increased - VH precautions reviewed -- minimize activities, keep head elevated, avoid ASA/NSAIDs/blood thinners as able - continue PF q1h and Prolensa QID OD for possible inflammatory component - cont 40mg  po prednisone on 11.26.24 - b-scan 12.04.24 shows significant mobile vit opacities, ?RD - POD1 s/p PPV/PFO/EL/FAX/SO OD, 12.05.2024             - doing well this morning             - retina attached and in good position             -  IOP okay at 11             - start   PF q1h OD                          zymaxid QID OD                          Atropine BID OD                         PSO ung QID OD              - cont face down positioning x3 days; avoid laying flat on back              - eye shield when sleeping              - post op drop and positioning instructions reviewed              - tylenol/ibuprofen for pain              - Rx given for breakthrough pain  - f/u  next Thursday, DFE  2,3. CSCR OD -- chronic  - pt reports decreased vision OD since December 2019  - reports significant stressors in life -- work  - denies steroid use - FA (09.30.20) shows focal hyperfluorescent leakage points nasal macula OD -- expansile dot phenotype? - started 50 mg po eplerenone since 09.30.20  -- pt self d/c in February, re-started in June 2021 - s/p IVA OD #1 (09.01.21), #2 (09.29.21), #3 (11.03.21), #4 (12.03.21) -- IVA resistance - s/p IVE OD #1 (01.20.22), #2 (02.18.22), #3 (04.05.22-sample), #4 (05.23.22), # 5 (06.27.22), #6 (07.28.22), #7 (08.26.22), #8 (09.23.22), #9 (10.21.22), #10 (11.18.22), #11 (12.16.22), #12 (01.20.23), #13 (03.02.23), #14 (04.07.23), #15 (05.12.23), #16 (06.09.23), #17 (07.14.23), #18 (08.08.23), #19 (09.08.23), #20 (10.06.23), #21 (11.06.23), #22 (12.04.23), #23 (01.08.24), #24 (02.12.24), #25 (03.18.24), #26 (04.22.24), #27 (05.20.24) -- IVE resistance ======================================================== - s/p IVV OD #1 (07.02.24), #2 (07.30.24), #3 (08.27.24), #4 (09.26.24), #5 (10.24.24)  - BCVA OD NLP from CF - OCT -- no images obtained today  - differential includes atypical exudative ARMD - IVV informed consent obtained and signed, 07.02.24  - continue po eplerenone 50 mg daily  - monitor for now  4. Retinoschisis OU (OD > OS)  - bullous retinoschisis cavity IT quadrant OD-- 1610-9604 oclock  - bullous retinoschisis cavity IT quadrant OS -- 5409-8119 oclock  - no associated retinal holes/RT/RD on scleral depression   - widefield [employer] imaging (04.22.24) shows mild interval progression of schisis cavities' size and posterior extension in comparison to baseline images  - s/p laser retinopexy OS 08.12.24 -- good laser changes surrounding - s/p laser retinopexy OD (10.07.24) -- good early laser changes surrounding - new schisis cavity found on examination OD (10.28.24) - 1030 periphery -- no RT/RD on scleral depressed exam - s/p  laser retinopexy OD to new schisis at 1030 on 10.28.24 - now with [facility] +/- vit haze - monitor  5,6. Hypertensive retinopathy OU  - discussed importance of tight BP control  - monitor  7. Mixed form age related cataracts OU - The symptoms of cataract, surgical options, and treatments and risks were discussed with patient.  - discussed diagnosis and progression  - monitor  Ophthalmic Meds Ordered this visit:  No orders of the defined types were placed  in this encounter.    Return in about 6 days (around 09/22/2023) for f/u VH OD, DFE, POV.  There are no Patient Instructions on file for this visit.  This document serves as a record of services personally performed by Karie Chimera, MD, PhD. It was created on their behalf by Glee Arvin. Manson Passey, OA an ophthalmic technician. The creation of this record is the provider's dictation and/or activities during the visit.    Electronically signed by: Glee Arvin. Manson Passey, OA 09/17/23 10:45 AM   Karie Chimera, M.D., Ph.D. Diseases & Surgery of the Retina and Vitreous [facility]    Abbreviations: M myopia (nearsighted); A astigmatism; H hyperopia (farsighted); P presbyopia; Mrx spectacle prescription;  CTL contact lenses; OD right eye; OS left eye; OU both eyes  XT exotropia; ET esotropia; PEK punctate epithelial keratitis; PEE punctate epithelial erosions; DES dry eye syndrome; MGD meibomian gland dysfunction; ATs artificial tears; PFAT's preservative free artificial tears; NSC nuclear sclerotic cataract; PSC posterior subcapsular cataract; ERM epi-retinal membrane; PVD posterior vitreous detachment; RD retinal detachment; DM diabetes mellitus; DR diabetic retinopathy; NPDR non-proliferative diabetic retinopathy; PDR proliferative diabetic retinopathy; CSME clinically significant macular edema; DME diabetic macular edema; dbh dot blot hemorrhages; CWS cotton wool spot; POAG primary open angle glaucoma; C/D cup-to-disc ratio; HVF  humphrey visual field; GVF goldmann visual field; OCT optical coherence tomography; IOP intraocular pressure; BRVO Branch retinal vein occlusion; CRVO central retinal vein occlusion; CRAO central retinal artery occlusion; BRAO branch retinal artery occlusion; RT retinal tear; SB scleral buckle; PPV pars plana vitrectomy; VH Vitreous hemorrhage; PRP panretinal laser photocoagulation; IVK intravitreal kenalog; VMT vitreomacular traction; MH Macular hole;  NVD neovascularization of the disc; NVE neovascularization elsewhere; AREDS age related eye disease study; ARMD age related macular degeneration; POAG primary open angle glaucoma; EBMD epithelial/anterior basement membrane dystrophy; ACIOL anterior chamber intraocular lens; IOL intraocular lens; PCIOL posterior chamber intraocular lens; Phaco/IOL phacoemulsification with intraocular lens placement; PRK photorefractive keratectomy; LASIK laser assisted in situ keratomileusis; HTN hypertension; DM diabetes mellitus; COPD chronic obstructive pulmonary disease

## 2023-09-17 ENCOUNTER — Encounter (INDEPENDENT_AMBULATORY_CARE_PROVIDER_SITE_OTHER): Payer: Self-pay | Admitting: Ophthalmology

## 2023-09-19 LAB — TOXOPLASMA GONDII, PCR: Toxoplasma Gondii, PCR: NEGATIVE

## 2023-09-20 LAB — AEROBIC/ANAEROBIC CULTURE W GRAM STAIN (SURGICAL/DEEP WOUND): Culture: NO GROWTH

## 2023-09-20 NOTE — Progress Notes (Signed)
[facility] - Clinic Note  09/22/2023     CHIEF COMPLAINT Patient presents for Retina Follow Up   HISTORY OF PRESENT ILLNESS: [name] is a 52 y.o. female who presents to the clinic today for:  HPI     Retina Follow Up   Patient presents with  Retinal Break/Detachment.  In right eye.  Severity is moderate.  Duration of 6 days.  Since onset it is stable.  I, the attending physician,  performed the HPI with the patient and updated documentation appropriately.        Comments   Patient feels the vision is the same. She is experiencing a little pain. She is using PF q1h OD, zymaxid QID OD, Atropine BID OD, PSO ung QID OD, po prednisone 40 mg daily.       Last edited by [name], MD on 09/23/2023  1:37 AM.    Pt states last week went well, she is not having any problems  Referring physician: [name], PA-C [address] [place],  [place] [address]  HISTORICAL INFORMATION:   Selected notes from the MEDICAL RECORD NUMBER Referred by Dr. [name] for concern of sub-macular fluid / retinoschisis   CURRENT MEDICATIONS: Current Outpatient Medications (Ophthalmic Drugs)  Medication Sig   bacitracin-polymyxin b (POLYSPORIN) ophthalmic ointment Place into the right eye 4 (four) times daily. Place a 1/2 inch ribbon of ointment into the lower eyelid.   prednisoLONE acetate (PRED FORTE) 1 % ophthalmic suspension Place 1 drop into the right eye 4 (four) times daily.   Bromfenac Sodium 0.09 % SOLN Apply 1 drop to eye in the morning, at noon, in the evening, and at bedtime. 1 drop in the right eye four times a day   prednisoLONE acetate (PRED FORTE) 1 % ophthalmic suspension Place 1 drop into the right eye every hour while awake.   No current facility-administered medications for this visit. (Ophthalmic Drugs)   Current Outpatient Medications (Other)  Medication Sig   diphenhydrAMINE HCl (BENADRYL ALLERGY PO) Take 25  mg by mouth daily as needed (allergies).   eplerenone (INSPRA) 50 MG tablet Take 50 mg by mouth at bedtime.   hydrochlorothiazide (HYDRODIURIL) 25 MG tablet Take 25 mg by mouth daily.   lisinopril (PRINIVIL,ZESTRIL) 20 MG tablet Take 1 tablet (20 mg total) by mouth daily.   predniSONE (DELTASONE) 20 MG tablet Take 2 tablets (40 mg total) by mouth daily with breakfast.   progesterone (PROMETRIUM) 100 MG capsule Take 1 capsule (100 mg total) by mouth at bedtime.   No current facility-administered medications for this visit. (Other)   REVIEW OF SYSTEMS: ROS   Positive for: Cardiovascular, Eyes Negative for: Constitutional, Gastrointestinal, Neurological, Skin, Genitourinary, Musculoskeletal, HENT, Endocrine, Respiratory, Psychiatric, Allergic/Imm, Heme/Lymph Last edited by Charlette Caffey, COT on 09/22/2023  7:48 AM.     ALLERGIES No Known Allergies  PAST MEDICAL HISTORY 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   followed by dr [name];  pt takes  inspra for excess fluid in retina,   Sciatic nerve injury  Seasonal allergies    Vaginal dysplasia    Wears glasses    Past Surgical History:  Procedure Laterality Date   CERVICAL CONIZATION W/BX N/A 06/30/2023   Procedure: CONIZATION CERVIX WITH BIOPSY, ENDOCERVICAL CURETTAGE;  Surgeon: Carver Fila, MD;  Location: [facility];  Service: Gynecology;  Laterality: N/A;   CO2 LASER APPLICATION N/A 06/30/2023   Procedure: CO2 LASER TO THE VAGINA;  Surgeon: Carver Fila, MD;  Location: [facility];  Service: Gynecology;  Laterality: N/A;   COLONOSCOPY  12/14/2022    HYSTEROSCOPY WITH RESECTOSCOPE  11/25/2006   @[facility]  by dr [name]. [name];   Resectoscopic myomectomy and polypectomy   INJECTION OF SILICONE OIL Right 09/15/2023   Procedure: INJECTION OF SILICONE OIL;  Surgeon: [name], MD;  Location: [facility] OR;  Service: Ophthalmology;  Laterality: Right;   PARS PLANA VITRECTOMY Right 09/15/2023   Procedure: PARS PLANA VITRECTOMY WITH 25 GAUGE;  Surgeon: [name], MD;  Location: [facility] OR;  Service: Ophthalmology;  Laterality: Right;   VAGINECTOMY, PARTIAL N/A 06/30/2023   Procedure: SMALL VAGINECTOMY, PARTIAL BIOSPIES;  Surgeon: Carver Fila, MD;  Location: [facility];  Service: Gynecology;  Laterality: N/A;   FAMILY HISTORY Family History  Problem Relation Age of Onset   Hypertension Mother    Diabetes Father    Diabetes Maternal Grandmother    Hypertension Maternal Grandmother    Colon cancer Neg Hx    Colon polyps Neg Hx    Esophageal cancer Neg Hx    Rectal cancer Neg Hx    Stomach cancer Neg Hx    SOCIAL HISTORY Social History   Tobacco Use   Smoking status: Never   Smokeless tobacco: Never  Vaping Use   Vaping status: Never Used  Substance Use Topics   Alcohol use: No    Alcohol/week: 0.0 standard drinks of alcohol   Drug use: Never       OPHTHALMIC EXAM: Base Eye Exam     Visual Acuity (Snellen - Linear)       Right Left   Dist cc HM 20/20   Dist ph cc NI     Correction: Glasses         Tonometry (Tonopen, 8:47 AM)       Right Left   Pressure 13 15         Pupils       Dark Light Shape React APD   Right dilated       Left 3 2 Round Brisk None         Visual Fields       Left Right    Full    Restrictions  Total superior nasal, inferior nasal deficiencies; Partial inner superior temporal, inferior temporal deficiencies         Extraocular Movement       Right Left    Full, Ortho Full, Ortho         Neuro/Psych     Oriented x3: Yes   Mood/Affect: Normal          Dilation     Both eyes: 1.0% Mydriacyl, 2.5% Phenylephrine @ 7:49 AM           Slit Lamp and Fundus Exam     Slit Lamp Exam       Right Left   Lids/Lashes Dermatochalasis - upper lid Dermatochalasis - upper lid   Conjunctiva/Sclera nasal pterygium with +vessels, sutures intact, 2+ Injection 1+ Injection, nasal Pinguecula  Cornea Nasal ptergyium extending 2.60mm onto nasal cornea, PEE, fine endo pigment 1+Punctate epithelial erosions   Anterior Chamber Deep, trace fine pigment deep, narrow temporal angle, 1+pigment   Iris Irregular pupil, scattered posterior synechia Round and dilated   Lens 2-3+ Nuclear sclerosis, 2+ Cortical cataract, +pigment deposition on anterior capsule (broken synechia) 2+ Nuclear sclerosis, 2+ Cortical cataract   Anterior Vitreous post vitrectomy, 95% silicone oil fill Mild Vitreous syneresis         Fundus Exam       Right Left   Disc Pink and Sharp    C/D Ratio 0.3 0.6   Macula tractional fibrosis temporal mac; Shallow SRF under oil    Vessels Attenuated, mild tortuosity; fibrosis    Periphery 360 laser, +SRF temporal periphery with +fibrosis            Refraction     Wearing Rx       Sphere Cylinder Axis   Right +1.25 +1.25 112   Left +2.25 +1.00 101           IMAGING AND PROCEDURES  Imaging and Procedures for @TODAY @  OCT, Retina - OU - Both Eyes       Right Eye Quality was good. Central Foveal Thickness: 843. Findings include no IRF, subretinal hyper-reflective material, epiretinal membrane, pigment epithelial detachment, subretinal fluid (Diffuse SRF -- detached under oil).   Left Eye Quality was good. Central Foveal Thickness: 300. Progression has been stable. Findings include normal foveal contour, no IRF, subretinal fluid, vitreomacular adhesion (Small pocket of SRF IN to disc caught on widefield -- persistent, bullous schisis cavity IT periphery caught on widefield ).   Notes *Images captured and stored on  drive  Diagnosis / Impression:  OD: Diffuse SRF -- detached under oil OS: Small pocket of SRF IN to disc caught on widefield -- persistent, bullous schisis cavity IT periphery caught on widefield   Clinical management:  See below  Abbreviations: NFP - Normal foveal profile. CME - cystoid macular edema. PED - pigment epithelial detachment. IRF - intraretinal fluid. SRF - subretinal fluid. EZ - ellipsoid zone. ERM - epiretinal membrane. ORA - outer retinal atrophy. ORT - outer retinal tubulation. SRHM - subretinal hyper-reflective material            ASSESSMENT/PLAN:    ICD-10-CM   1. Ocular inflammation  H57.89     2. 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 Traction detachment of right retina  H33.41 OCT, Retina - OU - Both Eyes    4. 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 Essential hypertension  I10     8. Hypertensive retinopathy of both eyes  H35.033     9. Combined forms of age-related cataract of both eyes  H25.813      1-3. Ocular inflammation and PVR retinal detachment OD - 10.16.24 pt started seeing new floaters and "sparks" in vision - pt developed progressively worsening vision and vit haze - VA OD decreased to LP despite aggressive anti-inflammatory medical therapy - pt was on PredForte q1h OD, Prolensa QID OD, and 40 mg po Prednisone daily - BCVA improved to HM from NLP post op - b-scan 12.04.24 shows significant mobile vit opacities, ?RD - POW1 s/p PPV/PFO/EL/FAX/SO OD,  12.05.2024  - undiluted vit samples obtained intra-op for gram stain and culture -- no organisms, but +WBCs  - intra-op - clearance of vitreous haze showed extensive fibrosis and PVR tractional detachment -- silicon oil added at end of case             - doing  well              - IOP okay at 13             - cont   PF q2h OD                          zymaxid QID OD -- stop on Saturday, December 14th                         Atropine BID OD -- stop now                         PSO ung QID OD   - cont po prednisone 40 mg daily             - cont face down positioning 60min/hr; avoid laying flat on back              - eye shield when sleeping x1 more week             - post op drop and positioning instructions reviewed              - tylenol/ibuprofen for pain  - discussed possible need to repeat PPV to remove scar tissue and repair residual RD - f/u 2-3 weeks -- POV, DFE  4,5. CSCR OD -- chronic  - pt reports decreased vision OD since December 2019  - reports significant stressors in life -- work  - denies steroid use - FA (09.30.20) shows focal hyperfluorescent leakage points nasal macula OD -- expansile dot phenotype? - started 50 mg po eplerenone since 09.30.20  -- pt self d/c in February, re-started in June 2021 - s/p IVA OD #1 (09.01.21), #2 (09.29.21), #3 (11.03.21), #4 (12.03.21) -- IVA resistance - s/p IVE OD #1 (01.20.22), #2 (02.18.22), #3 (04.05.22-sample), #4 (05.23.22), # 5 (06.27.22), #6 (07.28.22), #7 (08.26.22), #8 (09.23.22), #9 (10.21.22), #10 (11.18.22), #11 (12.16.22), #12 (01.20.23), #13 (03.02.23), #14 (04.07.23), #15 (05.12.23), #16 (06.09.23), #17 (07.14.23), #18 (08.08.23), #19 (09.08.23), #20 (10.06.23), #21 (11.06.23), #22 (12.04.23), #23 (01.08.24), #24 (02.12.24), #25 (03.18.24), #26 (04.22.24), #27 (05.20.24) -- IVE resistance ======================================================== - s/p IVV OD #1 (07.02.24), #2 (07.30.24), #3 (08.27.24), #4 (09.26.24), #5 (10.24.24) - OCT -- no images obtained today  - differential includes atypical exudative ARMD - IVV informed consent obtained and signed, 07.02.24  - stop po eplerenone 50 mg daily for now  - monitor for now  6. Retinoschisis OU (OD > OS)  - bullous retinoschisis  cavity IT quadrant OD-- [id number] oclock  - bullous retinoschisis cavity IT quadrant OS -- 0865-7846 oclock  - no associated retinal holes/RT/RD on scleral depression   - widefield Opt[employer] imaging (04.22.24) shows mild interval progression of schisis cavities' size and posterior extension in comparison to baseline images  - s/p laser retinopexy OS 08.12.24 -- good laser changes surrounding - s/p laser retinopexy OD (10.07.24) -- good early laser changes surrounding - new schisis cavity found on examination OD (10.28.24) - 1030 periphery -- no RT/RD on scleral depressed exam - s/p laser retinopexy  OD to new schisis at 1030 on 10.28.24 - monitor  7,8. Hypertensive retinopathy OU  - discussed importance of tight BP control  - monitor  9. Mixed form age related cataracts OU - The symptoms of cataract, surgical options, and treatments and risks were discussed with patient.  - discussed diagnosis and progression  - monitor  Ophthalmic Meds Ordered this visit:  Meds ordered this encounter  Medications   prednisoLONE acetate (PRED FORTE) 1 % ophthalmic suspension    Sig: Place 1 drop into the right eye 4 (four) times daily.    Dispense:  15 mL    Refill:  0   bacitracin-polymyxin b (POLYSPORIN) ophthalmic ointment    Sig: Place into the right eye 4 (four) times daily. Place a 1/2 inch ribbon of ointment into the lower eyelid.    Dispense:  3.5 g    Refill:  5     Return for f/u 2-3 weeks, RD OD, DFE, OCT.  There are no Patient Instructions on file for this visit.  This document serves as a record of services personally performed by Karie Chimera, MD, PhD. It was created on their behalf by Annalee Genta, COMT. The creation of this record is the provider's dictation and/or activities during the visit.  Electronically signed by: Annalee Genta, COMT 09/23/23 1:37 AM  This document serves as a record of services personally performed by Karie Chimera, MD, PhD. It was created on their behalf  by Glee Arvin. Manson Passey, OA an ophthalmic technician. The creation of this record is the provider's dictation and/or activities during the visit.    Electronically signed by: Glee Arvin. Manson Passey, OA 09/23/23 1:37 AM  Karie Chimera, M.D., Ph.D. Diseases & Surgery of the Retina and Vitreous [facility]  I have reviewed the above documentation for accuracy and completeness, and I agree with the above. Karie Chimera, M.D., Ph.D. 09/23/23 1:40 AM  Abbreviations: M myopia (nearsighted); A astigmatism; H hyperopia (farsighted); P presbyopia; Mrx spectacle prescription;  CTL contact lenses; OD right eye; OS left eye; OU both eyes  XT exotropia; ET esotropia; PEK punctate epithelial keratitis; PEE punctate epithelial erosions; DES dry eye syndrome; MGD meibomian gland dysfunction; ATs artificial tears; PFAT's preservative free artificial tears; NSC nuclear sclerotic cataract; PSC posterior subcapsular cataract; ERM epi-retinal membrane; PVD posterior vitreous detachment; RD retinal detachment; DM diabetes mellitus; DR diabetic retinopathy; NPDR non-proliferative diabetic retinopathy; PDR proliferative diabetic retinopathy; CSME clinically significant macular edema; DME diabetic macular edema; dbh dot blot hemorrhages; CWS cotton wool spot; POAG primary open angle glaucoma; C/D cup-to-disc ratio; HVF humphrey visual field; GVF goldmann visual field; OCT optical coherence tomography; IOP intraocular pressure; BRVO Branch retinal vein occlusion; CRVO central retinal vein occlusion; CRAO central retinal artery occlusion; BRAO branch retinal artery occlusion; RT retinal tear; SB scleral buckle; PPV pars plana vitrectomy; VH Vitreous hemorrhage; PRP panretinal laser photocoagulation; IVK intravitreal kenalog; VMT vitreomacular traction; MH Macular hole;  NVD neovascularization of the disc; NVE neovascularization elsewhere; AREDS age related eye disease study; ARMD age related macular degeneration; POAG  primary open angle glaucoma; EBMD epithelial/anterior basement membrane dystrophy; ACIOL anterior chamber intraocular lens; IOL intraocular lens; PCIOL posterior chamber intraocular lens; Phaco/IOL phacoemulsification with intraocular lens placement; PRK photorefractive keratectomy; LASIK laser assisted in situ keratomileusis; HTN hypertension; DM diabetes mellitus; COPD chronic obstructive pulmonary disease

## 2023-09-22 ENCOUNTER — Ambulatory Visit (INDEPENDENT_AMBULATORY_CARE_PROVIDER_SITE_OTHER): Payer: Commercial Managed Care - PPO | Admitting: Ophthalmology

## 2023-09-22 DIAGNOSIS — H33103 Unspecified retinoschisis, bilateral: Secondary | ICD-10-CM

## 2023-09-22 DIAGNOSIS — H353211 Exudative age-related macular degeneration, right eye, with active choroidal neovascularization: Secondary | ICD-10-CM | POA: Diagnosis not present

## 2023-09-22 DIAGNOSIS — H25813 Combined forms of age-related cataract, bilateral: Secondary | ICD-10-CM

## 2023-09-22 DIAGNOSIS — I10 Essential (primary) hypertension: Secondary | ICD-10-CM | POA: Diagnosis not present

## 2023-09-22 DIAGNOSIS — H3521 Other non-diabetic proliferative retinopathy, right eye: Secondary | ICD-10-CM

## 2023-09-22 DIAGNOSIS — H35033 Hypertensive retinopathy, bilateral: Secondary | ICD-10-CM | POA: Diagnosis not present

## 2023-09-22 DIAGNOSIS — H35711 Central serous chorioretinopathy, right eye: Secondary | ICD-10-CM | POA: Diagnosis not present

## 2023-09-22 DIAGNOSIS — H3341 Traction detachment of retina, right eye: Secondary | ICD-10-CM

## 2023-09-22 DIAGNOSIS — H5789 Other specified disorders of eye and adnexa: Secondary | ICD-10-CM

## 2023-09-22 MED ORDER — BACITRACIN-POLYMYXIN B 500-10000 UNIT/GM OP OINT: TOPICAL_OINTMENT | Freq: Four times a day (QID) | OPHTHALMIC | 5 refills | Status: DC

## 2023-09-22 MED ORDER — PREDNISOLONE ACETATE 1 % OP SUSP: 1.0000 [drp] | Freq: Four times a day (QID) | OPHTHALMIC | 0 refills | Status: DC

## 2023-09-23 ENCOUNTER — Encounter (INDEPENDENT_AMBULATORY_CARE_PROVIDER_SITE_OTHER): Payer: Self-pay | Admitting: Ophthalmology

## 2023-09-29 NOTE — Progress Notes (Signed)
 [facility] - Clinic Note  10/11/2023     CHIEF COMPLAINT Patient presents for Retina Follow Up   HISTORY OF PRESENT ILLNESS: [name] is a 52 y.o. female who presents to the clinic today for:  HPI     Retina Follow Up   Patient presents with  Other.  In right eye.  This started 3 weeks ago.  I, the attending physician,  performed the HPI with the patient and updated documentation appropriately.        Comments   Patient here for 3 weeks retina follow up for s/p RD OD. Patient states vision about the same. Can see peripherally but not center. Sometimes gets like a spasm OD. Gets a little light pain then goes away. Using drops and ung.      Last edited by Valdemar Rogue, MD on 10/12/2023  3:08 PM.    Pt states she had to stop taking the oral prednisone  because she didn't sleep for a week  Referring physician: Mason Haywood LABOR, PA-C [address] [place],  [place] [address]  HISTORICAL INFORMATION:   Selected notes from the MEDICAL RECORD NUMBER Referred by Dr. Devere Kitty for concern of sub-macular fluid / retinoschisis   CURRENT MEDICATIONS: Current Outpatient Medications (Ophthalmic Drugs)  Medication Sig   bacitracin -polymyxin b  (POLYSPORIN ) ophthalmic ointment Place into the right eye 4 (four) times daily. Place a 1/2 inch ribbon of ointment into the lower eyelid.   Bromfenac  Sodium 0.09 % SOLN Apply 1 drop to eye in the morning, at noon, in the evening, and at bedtime. 1 drop in the right eye four times a day   prednisoLONE  acetate (PRED FORTE ) 1 % ophthalmic suspension Place 1 drop into the right eye every hour while awake.   prednisoLONE  acetate (PRED FORTE ) 1 % ophthalmic suspension Place 1 drop into the right eye 4 (four) times daily.   No current facility-administered medications for this visit. (Ophthalmic Drugs)   Current Outpatient Medications (Other)  Medication Sig   diphenhydrAMINE HCl (BENADRYL ALLERGY  PO) Take 25 mg by mouth daily as needed (allergies).   eplerenone  (INSPRA ) 50 MG tablet Take 50 mg by mouth at bedtime.   hydrochlorothiazide  (HYDRODIURIL ) 25 MG tablet Take 25 mg by mouth daily.   lisinopril  (PRINIVIL ,ZESTRIL ) 20 MG tablet Take 1 tablet (20 mg total) by mouth daily.   predniSONE  (DELTASONE ) 20 MG tablet Take 2 tablets (40 mg total) by mouth daily with breakfast.   progesterone  (PROMETRIUM ) 100 MG capsule Take 1 capsule (100 mg total) by mouth at bedtime.   No current facility-administered medications for this visit. (Other)   REVIEW OF SYSTEMS: ROS   Positive for: Cardiovascular, Eyes Negative for: Constitutional, Gastrointestinal, Neurological, Skin, Genitourinary, Musculoskeletal, HENT, Endocrine, Respiratory, Psychiatric, Allergic/Imm, Heme/Lymph Last edited by Orval Asberry RAMAN, COA on 10/11/2023  7:59 AM.     ALLERGIES No Known Allergies  PAST MEDICAL HISTORY 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   followed by dr Azari Janssens;  pt takes  inspra  for excess fluid in retina,   Sciatic  nerve injury    Seasonal allergies    Vaginal dysplasia    Wears glasses    Past Surgical History:  Procedure Laterality Date   CERVICAL CONIZATION W/BX N/A 06/30/2023   Procedure: CONIZATION CERVIX WITH BIOPSY, ENDOCERVICAL CURETTAGE;  Surgeon: Viktoria Comer SAUNDERS, MD;  Location[facility];  Service: Gynecology;  Laterality: N/A;   CO2 LASER APPLICATION N/A 06/30/2023   Procedure: CO2 LASER TO THE VAGINA;  Surgeon: Viktoria Comer SAUNDERS, MD;  Location[facility];  Service: Gynecology;  Laterality: N/A;   COLONOSCOPY   12/14/2022   HYSTEROSCOPY WITH RESECTOSCOPE  11/25/2006   @[facility]  by dr jinny. winfred;   Resectoscopic myomectomy and polypectomy   INJECTION OF SILICONE OIL Right 09/15/2023   Procedure: INJECTION OF SILICONE OIL;  Surgeon: Valdemar Rogue, MD;  Location: [facility] OR;  Service: Ophthalmology;  Laterality: Right;   PARS PLANA VITRECTOMY Right 09/15/2023   Procedure: PARS PLANA VITRECTOMY WITH 25 GAUGE;  Surgeon: Valdemar Rogue, MD;  Location: [facility] OR;  Service: Ophthalmology;  Laterality: Right;   VAGINECTOMY, PARTIAL N/A 06/30/2023   Procedure: SMALL VAGINECTOMY, PARTIAL BIOSPIES;  Surgeon: Viktoria Comer SAUNDERS, MD;  Location: [facility];  Service: Gynecology;  Laterality: N/A;   FAMILY HISTORY Family History  Problem Relation Age of Onset   Hypertension Mother    Diabetes Father    Diabetes Maternal Grandmother    Hypertension Maternal Grandmother    Colon cancer Neg Hx    Colon polyps Neg Hx    Esophageal cancer Neg Hx    Rectal cancer Neg Hx    Stomach cancer Neg Hx    SOCIAL HISTORY Social History   Tobacco Use   Smoking status: Never   Smokeless tobacco: Never  Vaping Use   Vaping status: Never Used  Substance Use Topics   Alcohol use: No    Alcohol/week: 0.0 standard drinks of alcohol   Drug use: Never       OPHTHALMIC EXAM: Base Eye Exam     Visual Acuity (Snellen - Linear)       Right Left   Dist cc CF at 3' 20/20   Dist ph cc NI     Correction: Glasses         Tonometry (Tonopen, 7:54 AM)       Right Left   Pressure 13 16         Pupils       Dark Light Shape React APD   Right 6 6 Round NR None   Left 3 2 Round Brisk None         Visual Fields (Counting fingers)       Left Right    Full    Restrictions  Partial inner superior temporal, inferior temporal, superior nasal, inferior nasal deficiencies         Extraocular Movement       Right Left    Full, Ortho Full, Ortho         Neuro/Psych     Oriented x3: Yes    Mood/Affect: Normal         Dilation     Both eyes: 1.0% Mydriacyl , 2.5% Phenylephrine  @ 7:54 AM           Slit Lamp and Fundus Exam     Slit Lamp Exam       Right Left   Lids/Lashes Dermatochalasis - upper lid Dermatochalasis - upper lid   Conjunctiva/Sclera nasal pterygium with +vessels, sutures dissolving,  1+ Injection 1+ Injection, nasal Pinguecula   Cornea Nasal ptergyium extending 2.33mm onto nasal cornea, fine endo pigment 1+Punctate epithelial erosions   Anterior Chamber Deep, 2-3+fine pigment deep, narrow temporal angle, 1+pigment   Iris Round and dilated Round and dilated   Lens 2-3+ Nuclear sclerosis, 2-3+ Cortical cataract, +pigment deposition on anterior capsule (broken synechia) 2+ Nuclear sclerosis, 2+ Cortical cataract   Anterior Vitreous post vitrectomy, 95% silicone oil fill Mild Vitreous syneresis         Fundus Exam       Right Left   Disc Pink and Sharp, +fibrosis    C/D Ratio 0.3 0.6   Macula tractional fibrosis temporal mac; Shallow SRF under oil, +macular dragging of striae    Vessels Attenuated, tortuous; fibrosis    Periphery 360 laser, +SRF temporal periphery with +fibrosis -- dense fibrosis temporal periphery (0900)            Refraction     Wearing Rx       Sphere Cylinder Axis   Right +1.25 +1.25 112   Left +2.25 +1.00 101           IMAGING AND PROCEDURES  Imaging and Procedures for @TODAY @  OCT, Retina - OU - Both Eyes       Right Eye Quality was good. Central Foveal Thickness: 540. Progression has improved. Findings include no IRF, subretinal hyper-reflective material, epiretinal membrane, pigment epithelial detachment, subretinal fluid (Diffuse SRF -- detached under oil -- slightly improved from prior).   Left Eye Quality was good. Central Foveal Thickness: 304. Progression has been stable. Findings include normal foveal contour, no IRF, subretinal fluid, vitreomacular adhesion (Small pocket of SRF IN to disc caught on  widefield -- slightly improved, bullous schisis cavity IT periphery caught on widefield ).   Notes *Images captured and stored on drive  Diagnosis / Impression:  OD: Diffuse SRF -- detached under oil -- slightly improved from prior OS: Small pocket of SRF IN to disc caught on widefield -- persistent, bullous schisis cavity IT periphery caught on widefield   Clinical management:  See below  Abbreviations: NFP - Normal foveal profile. CME - cystoid macular edema. PED - pigment epithelial detachment. IRF - intraretinal fluid. SRF - subretinal fluid. EZ - ellipsoid zone. ERM - epiretinal membrane. ORA - outer retinal atrophy. ORT - outer retinal tubulation. SRHM - subretinal hyper-reflective material            ASSESSMENT/PLAN:    ICD-10-CM   1. Ocular inflammation  H57.89     2. 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 Traction detachment of right retina  H33.41 OCT, Retina - OU - Both Eyes    4. 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 Essential hypertension  I10     8. Hypertensive retinopathy of both eyes  H35.033     9. Combined forms of age-related cataract of both eyes  H25.813      1-3. Ocular inflammation and PVR retinal detachment OD - 10.16.24 pt started seeing new floaters and sparks in vision - pt developed progressively worsening vision and vit haze - VA OD decreased to LP despite aggressive anti-inflammatory medical therapy - pt was on PredForte q1h OD, Prolensa  QID OD, and  40 mg po Prednisone  daily - b-scan 12.04.24 shows significant mobile vit opacities, ?RD - POW4 s/p PPV/PFO/EL/FAX/SO OD, 12.05.2024  - undiluted vit samples obtained intra-op for gram stain and culture -- no organisms, but +WBCs - intra-op - clearance of  vitreous haze showed extensive fibrosis and PVR tractional detachment -- silicon oil added at end of case - IOP okay at 13 - exam shows residual intraretinal fibrosis and persistent SRF             - cont   PF q2h OD                         PSO ung BID OD   - cont po prednisone  40 mg daily -- pt self d/c one week ago -- ok to stay off             - cont face down positioning 79min/hr; avoid laying flat on back             - post op drop and positioning instructions reviewed              - tylenol /ibuprofen  for pain  - discussed risk/benefit of repeat PPV to remove scar tissue and repair residual RD -- will hold monitor for now - f/u 3-4 weeks -- POV, DFE - cleared to return to work on October 17, 2023 -- recommend 1 wk of light duty with gradual increase to regular duty  4,5. CSCR OD -- chronic  - pt reports decreased vision OD since December 2019  - reports significant stressors in life -- work  - denies steroid use - FA (09.30.20) shows focal hyperfluorescent leakage points nasal macula OD -- expansile dot phenotype? - started 50 mg po eplerenone  since 09.30.20  -- pt self d/c in February, re-started in June 2021 - s/p IVA OD #1 (09.01.21), #2 (09.29.21), #3 (11.03.21), #4 (12.03.21) -- IVA resistance - s/p IVE OD #1 (01.20.22), #2 (02.18.22), #3 (04.05.22-sample), #4 (05.23.22), # 5 (06.27.22), #6 (07.28.22), #7 (08.26.22), #8 (09.23.22), #9 (10.21.22), #10 (11.18.22), #11 (12.16.22), #12 (01.20.23), #13 (03.02.23), #14 (04.07.23), #15 (05.12.23), #16 (06.09.23), #17 (07.14.23), #18 (08.08.23), #19 (09.08.23), #20 (10.06.23), #21 (11.06.23), #22 (12.04.23), #23 (01.08.24), #24 (02.12.24), #25 (03.18.24), #26 (04.22.24), #27 (05.20.24) -- IVE resistance ======================================================== - s/p IVV OD #1 (07.02.24), #2 (07.30.24), #3 (08.27.24), #4 (09.26.24), #5 (10.24.24) - OCT -- no images obtained today  - differential includes atypical exudative ARMD - IVV informed  consent obtained and signed, 07.02.24  - stop po eplerenone  50 mg daily for now  - monitor for now  6. Retinoschisis OU (OD > OS)  - bullous retinoschisis cavity IT quadrant OD-- 9299-9169 oclock  - bullous retinoschisis cavity IT quadrant OS -- 9669-9569 oclock  - no associated retinal holes/RT/RD on scleral depression   - widefield Optos imaging (04.22.24) shows mild interval progression of schisis cavities' size and posterior extension in comparison to baseline images  - s/p laser retinopexy OS 08.12.24 -- good laser changes surrounding - s/p laser retinopexy OD (10.07.24) -- good early laser changes surrounding - new schisis cavity found on examination OD (10.28.24) - 1030 periphery -- no RT/RD on scleral depressed exam - s/p laser retinopexy OD to new schisis at 1030 on 10.28.24 - monitor  7,8. Hypertensive retinopathy OU  - discussed importance of tight BP control  - monitor  9. Mixed form age related cataracts OU - The symptoms of cataract, surgical options, and treatments and risks were  discussed with patient.  - discussed diagnosis and progression  - monitor  Ophthalmic Meds Ordered this visit:  No orders of the defined types were placed in this encounter.    Return for f/u 3-4 weeks, RD OD, DFE, OCT.  There are no Patient Instructions on file for this visit.  This document serves as a record of services personally performed by Redell JUDITHANN Hans, MD, PhD. It was created on their behalf by Wanda GEANNIE Keens, COT an ophthalmic technician. The creation of this record is the provider's dictation and/or activities during the visit.    Electronically signed by:  Wanda GEANNIE Keens, COT  10/12/23 3:28 PM  This document serves as a record of services personally performed by Redell JUDITHANN Hans, MD, PhD. It was created on their behalf by Alan PARAS. Delores, OA an ophthalmic technician. The creation of this record is the provider's dictation and/or activities during the visit.     Electronically signed by: Alan PARAS. Delores, OA 10/12/23 3:28 PM  Redell JUDITHANN Hans, M.D., Ph.D. Diseases & Surgery of the Retina and Vitreous [facility]  I have reviewed the above documentation for accuracy and completeness, and I agree with the above. Redell JUDITHANN Hans, M.D., Ph.D. 10/12/23 3:32 PM   Abbreviations: M myopia (nearsighted); A astigmatism; H hyperopia (farsighted); P presbyopia; Mrx spectacle prescription;  CTL contact lenses; OD right eye; OS left eye; OU both eyes  XT exotropia; ET esotropia; PEK punctate epithelial keratitis; PEE punctate epithelial erosions; DES dry eye syndrome; MGD meibomian gland dysfunction; ATs artificial tears; PFAT's preservative free artificial tears; NSC nuclear sclerotic cataract; PSC posterior subcapsular cataract; ERM epi-retinal membrane; PVD posterior vitreous detachment; RD retinal detachment; DM diabetes mellitus; DR diabetic retinopathy; NPDR non-proliferative diabetic retinopathy; PDR proliferative diabetic retinopathy; CSME clinically significant macular edema; DME diabetic macular edema; dbh dot blot hemorrhages; CWS cotton wool spot; POAG primary open angle glaucoma; C/D cup-to-disc ratio; HVF humphrey visual field; GVF goldmann visual field; OCT optical coherence tomography; IOP intraocular pressure; BRVO Branch retinal vein occlusion; CRVO central retinal vein occlusion; CRAO central retinal artery occlusion; BRAO branch retinal artery occlusion; RT retinal tear; SB scleral buckle; PPV pars plana vitrectomy; VH Vitreous hemorrhage; PRP panretinal laser photocoagulation; IVK intravitreal kenalog ; VMT vitreomacular traction; MH Macular hole;  NVD neovascularization of the disc; NVE neovascularization elsewhere; AREDS age related eye disease study; ARMD age related macular degeneration; POAG primary open angle glaucoma; EBMD epithelial/anterior basement membrane dystrophy; ACIOL anterior chamber intraocular lens; IOL intraocular  lens; PCIOL posterior chamber intraocular lens; Phaco/IOL phacoemulsification with intraocular lens placement; PRK photorefractive keratectomy; LASIK laser assisted in situ keratomileusis; HTN hypertension; DM diabetes mellitus; COPD chronic obstructive pulmonary disease

## 2023-10-11 ENCOUNTER — Encounter (INDEPENDENT_AMBULATORY_CARE_PROVIDER_SITE_OTHER): Payer: Self-pay | Admitting: Ophthalmology

## 2023-10-11 ENCOUNTER — Ambulatory Visit (INDEPENDENT_AMBULATORY_CARE_PROVIDER_SITE_OTHER): Payer: Commercial Managed Care - PPO | Admitting: Ophthalmology

## 2023-10-11 DIAGNOSIS — I10 Essential (primary) hypertension: Secondary | ICD-10-CM | POA: Diagnosis not present

## 2023-10-11 DIAGNOSIS — H3521 Other non-diabetic proliferative retinopathy, right eye: Secondary | ICD-10-CM

## 2023-10-11 DIAGNOSIS — H33103 Unspecified retinoschisis, bilateral: Secondary | ICD-10-CM | POA: Diagnosis not present

## 2023-10-11 DIAGNOSIS — H35711 Central serous chorioretinopathy, right eye: Secondary | ICD-10-CM | POA: Diagnosis not present

## 2023-10-11 DIAGNOSIS — H5789 Other specified disorders of eye and adnexa: Secondary | ICD-10-CM

## 2023-10-11 DIAGNOSIS — H353211 Exudative age-related macular degeneration, right eye, with active choroidal neovascularization: Secondary | ICD-10-CM

## 2023-10-11 DIAGNOSIS — H25813 Combined forms of age-related cataract, bilateral: Secondary | ICD-10-CM

## 2023-10-11 DIAGNOSIS — H3341 Traction detachment of retina, right eye: Secondary | ICD-10-CM

## 2023-10-11 DIAGNOSIS — H35033 Hypertensive retinopathy, bilateral: Secondary | ICD-10-CM

## 2023-10-12 ENCOUNTER — Encounter (INDEPENDENT_AMBULATORY_CARE_PROVIDER_SITE_OTHER): Payer: Self-pay | Admitting: Ophthalmology

## 2023-10-18 NOTE — Progress Notes (Signed)
[facility] - Clinic Note  11/01/2023     CHIEF COMPLAINT Patient presents for Retina Follow Up   HISTORY OF PRESENT ILLNESS: [name] is a 53 y.o. female who presents to the clinic today for:  HPI     Retina Follow Up   Patient presents with  Other.  In right eye.  Severity is severe.  Duration of 3 weeks.  Since onset it is rapidly worsening.  I, the attending physician,  performed the HPI with the patient and updated documentation appropriately.        Comments   Pt here for 3 wk ret f/u for ocular inflammation OD. Pt states OD has been red, watering and painful, starting last night when it got much more severe. She is still using pred forte q2hrs and PSO ung BID OD. VA is the same.       Last edited by [name], MD on 11/02/2023  9:55 PM.    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 [place],  [place] [address]  HISTORICAL INFORMATION:   Selected notes from the MEDICAL RECORD NUMBER Referred by Dr. [name] for concern of sub-macular fluid / retinoschisis   CURRENT MEDICATIONS: Current Outpatient Medications (Ophthalmic Drugs)  Medication Sig   prednisoLONE acetate (PRED FORTE) 1 % ophthalmic suspension Place 1 drop into the right eye 4 (four) times daily.   bacitracin-polymyxin b (POLYSPORIN) ophthalmic ointment Place into the right eye 2 (two) times daily. Place a 1/2 inch ribbon of ointment into the lower eyelid.   Bromfenac Sodium 0.09 % SOLN Apply 1 drop to eye in  the morning, at noon, in the evening, and at bedtime. 1 drop in the right eye four times a day (Patient not taking: Reported on 11/01/2023)   prednisoLONE acetate (PRED FORTE) 1 % ophthalmic suspension Place 1 drop into the right eye every 2 (two) hours.   No current facility-administered medications for this visit. (Ophthalmic Drugs)   Current Outpatient Medications (Other)  Medication Sig   diphenhydrAMINE HCl (BENADRYL ALLERGY PO) Take 25 mg by mouth daily as needed (allergies).   eplerenone (INSPRA) 50 MG tablet Take 50 mg by mouth at bedtime.   hydrochlorothiazide (HYDRODIURIL) 25 MG tablet Take 25 mg by mouth daily.   lisinopril (PRINIVIL,ZESTRIL) 20 MG tablet Take 1 tablet (20 mg total) by mouth daily.   progesterone (PROMETRIUM) 100 MG capsule Take 1 capsule (100 mg total) by mouth at bedtime.   predniSONE (DELTASONE) 20 MG tablet Take 2 tablets (40 mg total) by mouth daily with breakfast. (Patient not taking: Reported on 11/01/2023)   No current facility-administered medications for this visit. (Other)   REVIEW OF SYSTEMS: ROS   Positive for: Cardiovascular, Eyes Negative for: Constitutional, Gastrointestinal, Neurological, Skin, Genitourinary, Musculoskeletal, HENT, Endocrine, Respiratory, Psychiatric, Allergic/Imm, Heme/Lymph Last edited by Thompson Grayer, COT on 11/01/2023  8:06 AM.      ALLERGIES No Known Allergies  PAST MEDICAL HISTORY  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   followed by dr [name];  pt takes  inspra for excess fluid in retina,   Sciatic nerve injury    Seasonal allergies    Vaginal dysplasia    Wears glasses    Past  Surgical History:  Procedure Laterality Date   CERVICAL CONIZATION W/BX N/A 06/30/2023   Procedure: CONIZATION CERVIX WITH BIOPSY, ENDOCERVICAL CURETTAGE;  Surgeon: Carver Fila, MD;  Location: [facility];  Service: Gynecology;  Laterality: N/A;   CO2 LASER APPLICATION N/A 06/30/2023   Procedure: CO2 LASER TO THE VAGINA;  Surgeon: Carver Fila, MD;  Location: [facility];  Service: Gynecology;  Laterality: N/A;   COLONOSCOPY  12/14/2022   HYSTEROSCOPY WITH RESECTOSCOPE  11/25/2006   @[facility]  by dr [name]. [name];   Resectoscopic myomectomy and polypectomy   INJECTION OF SILICONE OIL Right 09/15/2023   Procedure: INJECTION OF SILICONE OIL;  Surgeon: [name], MD;  Location: [facility] OR;  Service: Ophthalmology;  Laterality: Right;   PARS PLANA VITRECTOMY Right 09/15/2023   Procedure: PARS PLANA VITRECTOMY WITH 25 GAUGE;  Surgeon: [name], MD;  Location: [facility] OR;  Service: Ophthalmology;  Laterality: Right;   VAGINECTOMY, PARTIAL N/A 06/30/2023   Procedure: SMALL VAGINECTOMY, PARTIAL BIOSPIES;  Surgeon: Carver Fila, MD;  Location: [facility];  Service: Gynecology;  Laterality: N/A;   FAMILY HISTORY Family History  Problem Relation Age of Onset   Hypertension Mother    Diabetes Father    Diabetes Maternal Grandmother    Hypertension Maternal Grandmother    Colon cancer Neg Hx    Colon polyps Neg Hx    Esophageal cancer Neg Hx    Rectal cancer Neg Hx    Stomach cancer Neg Hx    SOCIAL HISTORY Social History   Tobacco Use   Smoking status: Never   Smokeless tobacco: Never  Vaping Use   Vaping status: Never Used  Substance Use Topics   Alcohol use: No    Alcohol/week: 0.0 standard drinks of alcohol   Drug use: Never       OPHTHALMIC EXAM: Base Eye Exam     Visual Acuity (Snellen - Linear)       Right Left   Dist cc HM 20/20 -1   Dist ph cc NI          Tonometry (Tonopen, 8:12 AM)       Right Left    Pressure 36 14         Tonometry #2 (Tonopen, 8:12 AM)       Right Left   Pressure 37          Pupils       Dark Light Shape React APD   Right 6 6 Irregular NR 0   Left 3 2 Round Brisk None         Visual Fields (Counting fingers)       Left Right    Full    Restrictions  Total superior temporal, inferior temporal, inferior nasal deficiencies; Partial inner superior nasal  deficiency         Extraocular Movement       Right Left    Full, Ortho Full, Ortho         Neuro/Psych     Oriented x3: Yes   Mood/Affect: Normal         Dilation     Both eyes: 1.0% Mydriacyl, 2.5% Phenylephrine @ 8:16 AM           Slit Lamp and Fundus Exam     Slit Lamp Exam       Right Left   Lids/Lashes Dermatochalasis - upper lid Dermatochalasis - upper lid   Conjunctiva/Sclera Inflamed pterygium with +vessels, sutures dissolving, 1+ Injection 1+ Injection, nasal Pinguecula   Cornea Nasal ptergyium extending 2.42mm onto nasal cornea, fine endo pigment 1+Punctate epithelial erosions   Anterior Chamber Shallow with closed angels deep, narrow temporal angle, 1+pigment   Iris Round and dilated Round and dilated   Lens 2-3+ Nuclear sclerosis, 2-3+ Cortical cataract, +pigment deposition on anterior capsule (broken synechia) 2+ Nuclear sclerosis, 2+ Cortical cataract   Anterior Vitreous post vitrectomy, 95% silicone oil fill Mild Vitreous syneresis         Fundus Exam       Right Left   Disc Pink and Sharp, +fibrosis Pink and Sharp, +cupping, shallow peripapillary SRF just nasal to disc   C/D Ratio 0.3 0.6   Macula tractional fibrosis temporal mac; Shallow SRF under oil, +macular dragging with striae Flat, good foveal reflex, Epiretinal membrane, mild RPE mottling and clumping superiorly, No heme or edema   Vessels Attenuated, tortuous; fibrosis Vascular attenuation, mild Tortuousity   Periphery 360 laser, +SRF temporal periphery with +fibrosis -- dense fibrosis temporal  periphery (0900) Attached, temporal retinoschisis cavity from 0300-0430 peripherally -- stable from prior -- with good early laser changes posterior and surrounding, pigmented cystoid degeneration inferiorly           Refraction     Wearing Rx       Sphere Cylinder Axis   Right +1.25 +1.25 112   Left +2.25 +1.00 101           IMAGING AND PROCEDURES  Imaging and Procedures for @TODAY @  OCT, Retina - OU - Both Eyes       Right Eye Quality was poor. Progression has been stable. Findings include no IRF, subretinal hyper-reflective material, epiretinal membrane, pigment epithelial detachment, subretinal fluid (Diffuse SRF -- detached under oil ).   Left Eye Quality was good. Central Foveal Thickness: 306. Progression has been stable. Findings include normal foveal contour, no IRF, subretinal fluid, vitreomacular adhesion (Small pocket of SRF IN to disc caught on widefield -- slightly improved, bullous schisis cavity IT periphery caught on widefield ).   Notes *Images captured and stored on drive  Diagnosis / Impression:  OD: Diffuse SRF -- detached under oil OS: Small pocket of SRF IN to disc caught on widefield -- persistent, bullous schisis cavity IT periphery caught on widefield   Clinical management:  See below  Abbreviations: NFP - Normal foveal profile. CME - cystoid macular edema. PED - pigment epithelial detachment. IRF - intraretinal fluid. SRF - subretinal fluid. EZ - ellipsoid zone. ERM - epiretinal membrane. ORA - outer retinal atrophy. ORT - outer retinal tubulation. SRHM - subretinal hyper-reflective material            ASSESSMENT/PLAN:    ICD-10-CM   1. Ocular inflammation  H57.89     2. 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 Traction detachment of right retina  H33.41     4. 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 Essential hypertension  I10     8. Hypertensive retinopathy of both eyes  H35.033 OCT, Retina - OU - Both Eyes    9. Combined forms of age-related cataract of both eyes  H25.813      1-3. Ocular inflammation and PVR retinal detachment OD - 10.16.24 pt started seeing new floaters and "sparks" in vision - pt developed progressively worsening vision and vit haze - VA OD decreased to LP despite aggressive anti-inflammatory medical therapy - pt was on PredForte q1h OD, Prolensa QID OD, and 40 mg po Prednisone daily - b-scan 12.04.24 shows significant mobile vit opacities, ?RD - POW6 s/p PPV/PFO/EL/FAX/SO OD, 12.05.2024  - undiluted vit samples obtained intra-op for gram stain and culture -- no organisms, but +WBCs - intra-op - clearance of vitreous haze showed extensive fibrosis and PVR tractional detachment -- silicon oil added at end of case - IOP elevated at 36 -- will start brimonidine and Cosopt TID OD - exam shows residual intraretinal fibrosis and persistent SRF             - cont   PF q2h OD                         PSO ung BID OD   - off po prednisone 40 mg daily -- okay             - avoid laying flat on back             - post op drop and positioning instructions reviewed              - tylenol/ibuprofen for pain  - discussed risk/benefit of repeat PPV to remove scar tissue and repair residual RD -- will monitor for now - f/u Thursday, January 23 -- POV, DFE, IOP check - pt went back to work on October 17, 2023  4,5. CSCR OD -- chronic  - pt reports decreased vision OD since December 2019  - reports significant stressors in life -- work  - denies steroid use - FA (09.30.20) shows focal hyperfluorescent leakage points nasal macula OD -- expansile dot phenotype? - started 50 mg po eplerenone since 09.30.20  -- pt self d/c in February, re-started in June 2021 - s/p IVA  OD #1 (09.01.21), #2 (09.29.21), #3 (11.03.21), #4 (12.03.21) -- IVA resistance - s/p IVE OD #1 (01.20.22), #2 (02.18.22), #3 (04.05.22-sample), #4 (05.23.22), # 5 (06.27.22), #6 (07.28.22), #7 (08.26.22), #8 (09.23.22), #9 (10.21.22), #10 (11.18.22), #11 (12.16.22), #12 (01.20.23), #13 (03.02.23), #14 (04.07.23), #15 (05.12.23), #16 (06.09.23), #17 (07.14.23), #18 (08.08.23), #19 (09.08.23), #20 (10.06.23), #21 (11.06.23), #22 (12.04.23), #23 (01.08.24), #24 (02.12.24), #25 (03.18.24), #26 (04.22.24), #27 (05.20.24) -- IVE resistance ======================================================== - s/p IVV OD #1 (07.02.24), #2 (07.30.24), #3 (08.27.24), #4 (09.26.24), #5 (10.24.24) - OCT -- no images obtained today  - differential includes atypical exudative ARMD - IVV informed consent obtained and signed, 07.02.24  - stop po eplerenone 50 mg daily for now  - monitor for now  6. Retinoschisis OU (OD > OS)  - bullous retinoschisis cavity IT quadrant  OD-- 0626-9485 oclock  - bullous retinoschisis cavity IT quadrant OS -- 4627-0350 oclock  - no associated retinal holes/RT/RD on scleral depression   - widefield Optos imaging (04.22.24) shows mild interval progression of schisis cavities' size and posterior extension in comparison to baseline images  - s/p laser retinopexy OS 08.12.24 -- good laser changes surrounding - s/p laser retinopexy OD (10.07.24) -- good early laser changes surrounding - new schisis cavity found on examination OD (10.28.24) - 1030 periphery -- no RT/RD on scleral depressed exam - s/p laser retinopexy OD to new schisis at 1030 on 10.28.24 - monitor  7,8. Hypertensive retinopathy OU  - discussed importance of tight BP control  - monitor  9. Mixed form age related cataracts OU - The symptoms of cataract, surgical options, and treatments and risks were discussed with patient.  - discussed diagnosis and progression  - monitor  Ophthalmic Meds Ordered this visit:  Meds ordered  this encounter  Medications   prednisoLONE acetate (PRED FORTE) 1 % ophthalmic suspension    Sig: Place 1 drop into the right eye every 2 (two) hours.    Dispense:  15 mL    Refill:  1   bacitracin-polymyxin b (POLYSPORIN) ophthalmic ointment    Sig: Place into the right eye 2 (two) times daily. Place a 1/2 inch ribbon of ointment into the lower eyelid.    Dispense:  3.5 g    Refill:  5     Return in about 2 days (around 11/03/2023) for POV - IOP check, Dilated Exam, OCT.  There are no Patient Instructions on file for this visit.  This document serves as a record of services personally performed by Karie Chimera, MD, PhD. It was created on their behalf by Charlette Caffey, COT an ophthalmic technician. The creation of this record is the provider's dictation and/or activities during the visit.    Electronically signed by:  Charlette Caffey, COT  11/02/23 9:56 PM  This document serves as a record of services personally performed by Karie Chimera, MD, PhD. It was created on their behalf by Glee Arvin. Manson Passey, OA an ophthalmic technician. The creation of this record is the provider's dictation and/or activities during the visit.    Electronically signed by: Glee Arvin. Manson Passey, OA 11/02/23 9:56 PM  Karie Chimera, M.D., Ph.D. Diseases & Surgery of the Retina and Vitreous [facility]  I have reviewed the above documentation for accuracy and completeness, and I agree with the above. Karie Chimera, M.D., Ph.D. 11/02/23 10:02 PM  Abbreviations: M myopia (nearsighted); A astigmatism; H hyperopia (farsighted); P presbyopia; Mrx spectacle prescription;  CTL contact lenses; OD right eye; OS left eye; OU both eyes  XT exotropia; ET esotropia; PEK punctate epithelial keratitis; PEE punctate epithelial erosions; DES dry eye syndrome; MGD meibomian gland dysfunction; ATs artificial tears; PFAT's preservative free artificial tears; NSC nuclear sclerotic cataract; PSC  posterior subcapsular cataract; ERM epi-retinal membrane; PVD posterior vitreous detachment; RD retinal detachment; DM diabetes mellitus; DR diabetic retinopathy; NPDR non-proliferative diabetic retinopathy; PDR proliferative diabetic retinopathy; CSME clinically significant macular edema; DME diabetic macular edema; dbh dot blot hemorrhages; CWS cotton wool spot; POAG primary open angle glaucoma; C/D cup-to-disc ratio; HVF humphrey visual field; GVF goldmann visual field; OCT optical coherence tomography; IOP intraocular pressure; BRVO Branch retinal vein occlusion; CRVO central retinal vein occlusion; CRAO central retinal artery occlusion; BRAO branch retinal artery occlusion; RT retinal tear; SB scleral buckle; PPV pars plana vitrectomy;  VH Vitreous hemorrhage; PRP panretinal laser photocoagulation; IVK intravitreal kenalog; VMT vitreomacular traction; MH Macular hole;  NVD neovascularization of the disc; NVE neovascularization elsewhere; AREDS age related eye disease study; ARMD age related macular degeneration; POAG primary open angle glaucoma; EBMD epithelial/anterior basement membrane dystrophy; ACIOL anterior chamber intraocular lens; IOL intraocular lens; PCIOL posterior chamber intraocular lens; Phaco/IOL phacoemulsification with intraocular lens placement; PRK photorefractive keratectomy; LASIK laser assisted in situ keratomileusis; HTN hypertension; DM diabetes mellitus; COPD chronic obstructive pulmonary disease

## 2023-11-01 ENCOUNTER — Encounter (INDEPENDENT_AMBULATORY_CARE_PROVIDER_SITE_OTHER): Payer: Self-pay | Admitting: Ophthalmology

## 2023-11-01 ENCOUNTER — Ambulatory Visit (INDEPENDENT_AMBULATORY_CARE_PROVIDER_SITE_OTHER): Payer: Commercial Managed Care - PPO | Admitting: Ophthalmology

## 2023-11-01 DIAGNOSIS — I10 Essential (primary) hypertension: Secondary | ICD-10-CM

## 2023-11-01 DIAGNOSIS — H33103 Unspecified retinoschisis, bilateral: Secondary | ICD-10-CM

## 2023-11-01 DIAGNOSIS — H353211 Exudative age-related macular degeneration, right eye, with active choroidal neovascularization: Secondary | ICD-10-CM

## 2023-11-01 DIAGNOSIS — H35033 Hypertensive retinopathy, bilateral: Secondary | ICD-10-CM

## 2023-11-01 DIAGNOSIS — H35711 Central serous chorioretinopathy, right eye: Secondary | ICD-10-CM

## 2023-11-01 DIAGNOSIS — H25813 Combined forms of age-related cataract, bilateral: Secondary | ICD-10-CM

## 2023-11-01 DIAGNOSIS — H5789 Other specified disorders of eye and adnexa: Secondary | ICD-10-CM

## 2023-11-01 DIAGNOSIS — H3341 Traction detachment of retina, right eye: Secondary | ICD-10-CM

## 2023-11-01 DIAGNOSIS — H3521 Other non-diabetic proliferative retinopathy, right eye: Secondary | ICD-10-CM

## 2023-11-01 MED ORDER — BACITRACIN-POLYMYXIN B 500-10000 UNIT/GM OP OINT: TOPICAL_OINTMENT | Freq: Two times a day (BID) | OPHTHALMIC | 5 refills | Status: DC

## 2023-11-01 MED ORDER — PREDNISOLONE ACETATE 1 % OP SUSP: 1.0000 [drp] | OPHTHALMIC | 1 refills | Status: DC

## 2023-11-01 NOTE — Progress Notes (Shared)
[facility] - Clinic Note  11/03/2023     CHIEF COMPLAINT Patient presents for No chief complaint on file.   HISTORY OF PRESENT ILLNESS: [name] is a 53 y.o. female who presents to the clinic today for:   Pt states she had to stop taking the oral prednisone because she didn't sleep for a week  Referring physician: [name], PA-C [address] [place],  [place] [address]  HISTORICAL INFORMATION:   Selected notes from the MEDICAL RECORD NUMBER Referred by Dr. [name] for concern of sub-macular fluid / retinoschisis   CURRENT MEDICATIONS: Current Outpatient Medications (Ophthalmic Drugs)  Medication Sig   bacitracin-polymyxin b (POLYSPORIN) ophthalmic ointment Place into the right eye 2 (two) times daily. Place a 1/2 inch ribbon of ointment into the lower eyelid.   Bromfenac Sodium 0.09 % SOLN Apply 1 drop to eye in the morning, at noon, in the evening, and at bedtime. 1 drop in the right eye four times a day (Patient not taking: Reported on 11/01/2023)   prednisoLONE acetate (PRED FORTE) 1 % ophthalmic suspension Place 1 drop into the right eye 4 (four) times daily.   prednisoLONE acetate (PRED FORTE) 1 % ophthalmic suspension Place 1 drop into the right eye every 2 (two) hours.   No current facility-administered medications for this visit. (Ophthalmic Drugs)   Current Outpatient Medications (Other)  Medication Sig   diphenhydrAMINE HCl (BENADRYL ALLERGY PO) Take 25 mg by mouth daily as needed (allergies).   eplerenone (INSPRA) 50 MG tablet Take 50 mg by mouth at bedtime.   hydrochlorothiazide (HYDRODIURIL) 25 MG tablet Take 25 mg by mouth daily.   lisinopril (PRINIVIL,ZESTRIL) 20 MG tablet Take 1 tablet (20 mg total) by mouth daily.   predniSONE (DELTASONE) 20 MG tablet Take 2 tablets (40 mg total) by mouth daily with breakfast. (Patient not taking: Reported on 11/01/2023)   progesterone (PROMETRIUM) 100 MG capsule  Take 1 capsule (100 mg total) by mouth at bedtime.   No current facility-administered medications for this visit. (Other)   REVIEW OF SYSTEMS:   ALLERGIES No Known Allergies  PAST MEDICAL HISTORY 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   followed by dr [name];  pt takes  inspra for excess fluid in retina,   Sciatic nerve injury    Seasonal allergies    Vaginal dysplasia    Wears glasses    Past Surgical History:  Procedure Laterality Date   CERVICAL CONIZATION W/BX N/A 06/30/2023   Procedure: CONIZATION CERVIX WITH BIOPSY, ENDOCERVICAL CURETTAGE;  Surgeon: Carver Fila, MD;  Location: [facility] [place];  Service: Gynecology;  Laterality: N/A;   CO2 LASER APPLICATION N/A 06/30/2023   Procedure: CO2 LASER TO THE VAGINA;  Surgeon: Carver Fila, MD;  Location: [facility];  Service: Gynecology;  Laterality: N/A;   COLONOSCOPY  12/14/2022   HYSTEROSCOPY WITH RESECTOSCOPE  11/25/2006   @[facility]  by dr [name]. [name];   Resectoscopic myomectomy and polypectomy   INJECTION OF SILICONE OIL Right 09/15/2023   Procedure: INJECTION OF  SILICONE OIL;  Surgeon: [name], MD;  Location[facility];  Service: Ophthalmology;  Laterality: Right;   PARS PLANA VITRECTOMY Right 09/15/2023   Procedure: PARS PLANA VITRECTOMY WITH 25 GAUGE;  Surgeon: [name], MD;  Location: [facility] OR;  Service: Ophthalmology;  Laterality: Right;   VAGINECTOMY, PARTIAL N/A 06/30/2023   Procedure: SMALL VAGINECTOMY, PARTIAL BIOSPIES;  Surgeon: Carver Fila, MD;  Location: [facility];  Service: Gynecology;  Laterality: N/A;   FAMILY  HISTORY Family History  Problem Relation Age of Onset   Hypertension Mother    Diabetes Father    Diabetes Maternal Grandmother    Hypertension Maternal Grandmother    Colon cancer Neg Hx    Colon polyps Neg Hx    Esophageal cancer Neg Hx    Rectal cancer Neg Hx    Stomach cancer Neg Hx    SOCIAL HISTORY Social History   Tobacco Use   Smoking status: Never   Smokeless tobacco: Never  Vaping Use   Vaping status: Never Used  Substance Use Topics   Alcohol use: No    Alcohol/week: 0.0 standard drinks of alcohol   Drug use: Never       OPHTHALMIC EXAM: Not recorded    IMAGING AND PROCEDURES  Imaging and Procedures for @TODAY @          ASSESSMENT/PLAN:    ICD-10-CM   1. Ocular inflammation  H57.89     2. 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 Traction detachment of right retina  H33.41     4. 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 Essential hypertension  I10     8. Hypertensive retinopathy of both eyes  H35.033     9. Combined forms of age-related cataract of both eyes  H25.813       1-3. Ocular inflammation and PVR retinal detachment OD - 10.16.24 pt started seeing new floaters and "sparks" in vision - pt developed progressively worsening vision and vit haze - VA OD decreased to LP despite aggressive anti-inflammatory medical therapy - pt was on PredForte q1h OD, Prolensa QID OD, and 40 mg po Prednisone daily - b-scan 12.04.24 shows significant mobile vit opacities, ?RD - POW4 s/p PPV/PFO/EL/FAX/SO OD, 12.05.2024  - undiluted vit samples obtained intra-op for gram stain and culture -- no organisms, but +WBCs - intra-op - clearance of vitreous haze showed extensive fibrosis and PVR tractional detachment -- silicon oil added at end of case - IOP okay  at 13 - exam shows residual intraretinal fibrosis and persistent SRF             - cont   PF q2h OD                         PSO ung BID OD   - cont po prednisone 40 mg daily -- pt self d/c one week ago -- ok to stay off             - cont face down positioning 22min/hr; avoid laying flat on back             - post op drop and positioning instructions reviewed              -  tylenol/ibuprofen for pain  - discussed risk/benefit of repeat PPV to remove scar tissue and repair residual RD -- will hold monitor for now - f/u 3-4 weeks -- POV, DFE - cleared to return to work on October 17, 2023 -- recommend 1 wk of light duty with gradual increase to regular duty  4,5. CSCR OD -- chronic  - pt reports decreased vision OD since December 2019  - reports significant stressors in life -- work  - denies steroid use - FA (09.30.20) shows focal hyperfluorescent leakage points nasal macula OD -- expansile dot phenotype? - started 50 mg po eplerenone since 09.30.20  -- pt self d/c in February, re-started in June 2021 - s/p IVA OD #1 (09.01.21), #2 (09.29.21), #3 (11.03.21), #4 (12.03.21) -- IVA resistance - s/p IVE OD #1 (01.20.22), #2 (02.18.22), #3 (04.05.22-sample), #4 (05.23.22), # 5 (06.27.22), #6 (07.28.22), #7 (08.26.22), #8 (09.23.22), #9 (10.21.22), #10 (11.18.22), #11 (12.16.22), #12 (01.20.23), #13 (03.02.23), #14 (04.07.23), #15 (05.12.23), #16 (06.09.23), #17 (07.14.23), #18 (08.08.23), #19 (09.08.23), #20 (10.06.23), #21 (11.06.23), #22 (12.04.23), #23 (01.08.24), #24 (02.12.24), #25 (03.18.24), #26 (04.22.24), #27 (05.20.24) -- IVE resistance ======================================================== - s/p IVV OD #1 (07.02.24), #2 (07.30.24), #3 (08.27.24), #4 (09.26.24), #5 (10.24.24) - OCT -- no images obtained today  - differential includes atypical exudative ARMD - IVV informed consent obtained and signed, 07.02.24  - stop po eplerenone 50 mg daily for now  - monitor for now  6.  Retinoschisis OU (OD > OS)  - bullous retinoschisis cavity IT quadrant OD-- 6433-2951 oclock  - bullous retinoschisis cavity IT quadrant OS -- 8841-6606 oclock  - no associated retinal holes/RT/RD on scleral depression   - widefield Optos imaging (04.22.24) shows mild interval progression of schisis cavities' size and posterior extension in comparison to baseline images  - s/p laser retinopexy OS 08.12.24 -- good laser changes surrounding - s/p laser retinopexy OD (10.07.24) -- good early laser changes surrounding - new schisis cavity found on examination OD (10.28.24) - 1030 periphery -- no RT/RD on scleral depressed exam - s/p laser retinopexy OD to new schisis at 1030 on 10.28.24 - monitor  7,8. Hypertensive retinopathy OU  - discussed importance of tight BP control  - monitor  9. Mixed form age related cataracts OU - The symptoms of cataract, surgical options, and treatments and risks were discussed with patient.  - discussed diagnosis and progression  - monitor  Ophthalmic Meds Ordered this visit:  No orders of the defined types were placed in this encounter.    No follow-ups on file.  There are no Patient Instructions on file for this visit.  This document serves as a record of services personally performed by Karie Chimera, MD, PhD. It was created on their behalf by Annalee Genta, COMT. The creation of this record is the provider's dictation and/or activities during the visit.  Electronically signed by: Annalee Genta, COMT 11/01/23 9:17 AM    Karie Chimera, M.D., Ph.D. Diseases & Surgery of the Retina and Vitreous [facility]     Abbreviations: M myopia (nearsighted); A astigmatism; H hyperopia (farsighted); P presbyopia; Mrx spectacle prescription;  CTL contact lenses; OD right eye; OS left eye; OU both eyes  XT exotropia; ET esotropia; PEK punctate epithelial keratitis; PEE punctate epithelial erosions; DES dry eye syndrome; MGD meibomian  gland dysfunction; ATs artificial tears; PFAT's preservative free artificial tears; NSC nuclear sclerotic cataract; PSC posterior subcapsular cataract; ERM epi-retinal membrane; PVD posterior vitreous detachment; RD retinal  detachment; DM diabetes mellitus; DR diabetic retinopathy; NPDR non-proliferative diabetic retinopathy; PDR proliferative diabetic retinopathy; CSME clinically significant macular edema; DME diabetic macular edema; dbh dot blot hemorrhages; CWS cotton wool spot; POAG primary open angle glaucoma; C/D cup-to-disc ratio; HVF humphrey visual field; GVF goldmann visual field; OCT optical coherence tomography; IOP intraocular pressure; BRVO Branch retinal vein occlusion; CRVO central retinal vein occlusion; CRAO central retinal artery occlusion; BRAO branch retinal artery occlusion; RT retinal tear; SB scleral buckle; PPV pars plana vitrectomy; VH Vitreous hemorrhage; PRP panretinal laser photocoagulation; IVK intravitreal kenalog; VMT vitreomacular traction; MH Macular hole;  NVD neovascularization of the disc; NVE neovascularization elsewhere; AREDS age related eye disease study; ARMD age related macular degeneration; POAG primary open angle glaucoma; EBMD epithelial/anterior basement membrane dystrophy; ACIOL anterior chamber intraocular lens; IOL intraocular lens; PCIOL posterior chamber intraocular lens; Phaco/IOL phacoemulsification with intraocular lens placement; PRK photorefractive keratectomy; LASIK laser assisted in situ keratomileusis; HTN hypertension; DM diabetes mellitus; COPD chronic obstructive pulmonary disease

## 2023-11-02 ENCOUNTER — Encounter (INDEPENDENT_AMBULATORY_CARE_PROVIDER_SITE_OTHER): Payer: Self-pay | Admitting: Ophthalmology

## 2023-11-03 ENCOUNTER — Encounter (INDEPENDENT_AMBULATORY_CARE_PROVIDER_SITE_OTHER): Payer: Self-pay | Admitting: Ophthalmology

## 2023-11-03 ENCOUNTER — Ambulatory Visit (INDEPENDENT_AMBULATORY_CARE_PROVIDER_SITE_OTHER): Payer: Commercial Managed Care - PPO | Admitting: Ophthalmology

## 2023-11-03 DIAGNOSIS — H35033 Hypertensive retinopathy, bilateral: Secondary | ICD-10-CM

## 2023-11-03 DIAGNOSIS — H25813 Combined forms of age-related cataract, bilateral: Secondary | ICD-10-CM

## 2023-11-03 DIAGNOSIS — H3521 Other non-diabetic proliferative retinopathy, right eye: Secondary | ICD-10-CM | POA: Diagnosis not present

## 2023-11-03 DIAGNOSIS — H3341 Traction detachment of retina, right eye: Secondary | ICD-10-CM

## 2023-11-03 DIAGNOSIS — H33103 Unspecified retinoschisis, bilateral: Secondary | ICD-10-CM | POA: Diagnosis not present

## 2023-11-03 DIAGNOSIS — H353211 Exudative age-related macular degeneration, right eye, with active choroidal neovascularization: Secondary | ICD-10-CM

## 2023-11-03 DIAGNOSIS — H5789 Other specified disorders of eye and adnexa: Secondary | ICD-10-CM

## 2023-11-03 DIAGNOSIS — I10 Essential (primary) hypertension: Secondary | ICD-10-CM

## 2023-11-03 DIAGNOSIS — H35711 Central serous chorioretinopathy, right eye: Secondary | ICD-10-CM

## 2023-11-03 NOTE — Progress Notes (Signed)
[facility] - Clinic Note  11/03/2023     CHIEF COMPLAINT Patient presents for Retina Follow Up   HISTORY OF PRESENT ILLNESS: [name] is a 53 y.o. female who presents to the clinic today for:  HPI     Retina Follow Up   Patient presents with  Other.  In right eye.  Severity is moderate.  Duration of 2 days.  Since onset it is gradually improving.  I, the attending physician,  performed the HPI with the patient and updated documentation appropriately.        Comments   Pt here for 2 day ret f/u for ocular inflammation/IOP chk OD. Pt states OD feels a lot better, still slightly painful. No improvement in [place] OD. Pt taking PF q 1 hr OD      Last edited by [name], MD on 11/03/2023  4:52 PM.    Pt states when she is at work and someone cooks with a lot of steam, it hurts her eye, but she puts ice on it and it seems to help  Referring physician: [name], PA-C [address] [place],  [place] [address]  HISTORICAL INFORMATION:   Selected notes from the MEDICAL RECORD NUMBER Referred by Dr. [name] for concern of sub-macular fluid / retinoschisis   CURRENT MEDICATIONS: Current Outpatient Medications (Ophthalmic Drugs)  Medication Sig   bacitracin-polymyxin b (POLYSPORIN) ophthalmic ointment Place into the right eye 2 (two) times daily. Place a 1/2 inch ribbon of ointment into the lower eyelid.   prednisoLONE acetate (PRED FORTE) 1 % ophthalmic suspension Place 1 drop into the right eye 4 (four) times daily.   prednisoLONE acetate (PRED FORTE) 1 % ophthalmic suspension Place 1 drop into the right eye every 2 (two) hours.   Bromfenac Sodium 0.09 % SOLN Apply 1 drop to eye in the morning, at noon, in the evening, and at bedtime. 1 drop in the right eye four times a day (Patient not taking: Reported on 11/01/2023)   No current facility-administered medications for this visit. (Ophthalmic Drugs)   Current  Outpatient Medications (Other)  Medication Sig   diphenhydrAMINE HCl (BENADRYL ALLERGY PO) Take 25 mg by mouth daily as needed (allergies).   eplerenone (INSPRA) 50 MG tablet Take 50 mg by mouth at bedtime.   hydrochlorothiazide (HYDRODIURIL) 25 MG tablet Take 25 mg by mouth daily.   lisinopril (PRINIVIL,ZESTRIL) 20 MG tablet Take 1 tablet (20 mg total) by mouth daily.   progesterone (PROMETRIUM) 100 MG capsule Take 1 capsule (100 mg total) by mouth at bedtime.   predniSONE (DELTASONE) 20 MG tablet Take 2 tablets (40 mg total) by mouth daily with breakfast. (Patient not taking: Reported on 11/01/2023)   No current facility-administered medications for this visit. (Other)   REVIEW OF SYSTEMS:    ALLERGIES No Known Allergies  PAST MEDICAL HISTORY 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   followed by dr [name];  pt takes  inspra for excess fluid in retina,   Sciatic nerve injury    Seasonal allergies  Vaginal dysplasia    Wears glasses    Past Surgical History:  Procedure Laterality Date   CERVICAL CONIZATION W/BX N/A 06/30/2023   Procedure: CONIZATION CERVIX WITH BIOPSY, ENDOCERVICAL CURETTAGE;  Surgeon: Carver Fila, MD;  Location: [facility];  Service: Gynecology;  Laterality: N/A;   CO2 LASER APPLICATION N/A 06/30/2023   Procedure: CO2 LASER TO THE VAGINA;  Surgeon: Carver Fila, MD;  Location: [facility];  Service: Gynecology;  Laterality: N/A;   COLONOSCOPY  12/14/2022   HYSTEROSCOPY WITH RESECTOSCOPE  11/25/2006   @[facility]  by dr [name]. [name];   Resectoscopic myomectomy and polypectomy   INJECTION OF  SILICONE OIL Right 09/15/2023   Procedure: INJECTION OF SILICONE OIL;  Surgeon: [name], MD;  Location: [facility] OR;  Service: Ophthalmology;  Laterality: Right;   PARS PLANA VITRECTOMY Right 09/15/2023   Procedure: PARS PLANA VITRECTOMY WITH 25 GAUGE;  Surgeon: [name], MD;  Location: [facility] OR;  Service: Ophthalmology;  Laterality: Right;   VAGINECTOMY, PARTIAL N/A 06/30/2023   Procedure: SMALL VAGINECTOMY, PARTIAL BIOSPIES;  Surgeon: Carver Fila, MD;  Location: [facility];  Service: Gynecology;  Laterality: N/A;   FAMILY HISTORY Family History  Problem Relation Age of Onset   Hypertension Mother    Diabetes Father    Diabetes Maternal Grandmother    Hypertension Maternal Grandmother    Colon cancer Neg Hx    Colon polyps Neg Hx    Esophageal cancer Neg Hx    Rectal cancer Neg Hx    Stomach cancer Neg Hx    SOCIAL HISTORY Social History   Tobacco Use   Smoking status: Never   Smokeless tobacco: Never  Vaping Use   Vaping status: Never Used  Substance Use Topics   Alcohol use: No    Alcohol/week: 0.0 standard drinks of alcohol   Drug use: Never       OPHTHALMIC EXAM: Base Eye Exam     Visual Acuity (Snellen - Linear)       Right Left   Dist cc CF at 3' 20/20 -2   Dist ph cc NI     Correction: Glasses         Tonometry (Tonopen, 7:49 AM)       Right Left   Pressure 10 12         Pupils       Dark Light Shape React APD   Right 5 6 Round nr None   Left 5 6 Round nr None         Visual Fields (Counting fingers)       Left Right    Full    Restrictions  Total inferior nasal deficiency; Partial inner superior temporal, inferior temporal, superior nasal deficiencies         Extraocular Movement       Right Left    Full, Ortho Full, Ortho         Neuro/Psych     Oriented x3: Yes   Mood/Affect: Normal         Dilation     Both eyes: 1.0% Mydriacyl, 2.5% Phenylephrine @ 7:50 AM           Slit Lamp and  Fundus Exam     Slit Lamp Exam       Right Left   Lids/Lashes Dermatochalasis - upper lid Dermatochalasis - upper lid   Conjunctiva/Sclera Inflamed pterygium with 1+ injxn, sutures dissolving 1+ Injection, nasal Pinguecula  Cornea Nasal ptergyium extending 2.82mm onto nasal cornea, fine endo pigment 1+Punctate epithelial erosions   Anterior Chamber Shallow with closed angels, 2-3+cell/pigment deep, narrow temporal angle, 1+pigment   Iris Round and dilated Round and dilated   Lens 2-3+ Nuclear sclerosis, 2-3+ Cortical cataract, +pigment deposition on anterior capsule (broken synechia) 2+ Nuclear sclerosis, 2+ Cortical cataract   Anterior Vitreous post vitrectomy, 95% silicone oil fill Mild Vitreous syneresis         Fundus Exam       Right Left   Disc Pink and Sharp, +fibrosis Pink and Sharp, +cupping, shallow peripapillary SRF just nasal to disc   C/D Ratio 0.3 0.6   Macula tractional fibrosis temporal mac; Shallow SRF under oil, +macular dragging with striae Flat, good foveal reflex, Epiretinal membrane, mild RPE mottling and clumping superiorly, No heme or edema   Vessels Attenuated, tortuous; fibrosis Vascular attenuation, mild Tortuousity   Periphery 360 laser, +SRF temporal periphery with +fibrosis -- dense fibrosis temporal periphery (0900) Attached, temporal retinoschisis cavity from 0300-0430 peripherally -- stable from prior -- with good early laser changes posterior and surrounding, pigmented cystoid degeneration inferiorly           IMAGING AND PROCEDURES  Imaging and Procedures for @TODAY @  OCT, Retina - OU - Both Eyes       Right Eye Quality was good. Central Foveal Thickness: 424. Progression has been stable. Findings include no IRF, subretinal hyper-reflective material, epiretinal membrane, pigment epithelial detachment, subretinal fluid (Diffuse SRF -- detached under oil ).   Left Eye Quality was good. Central Foveal Thickness: 303. Progression has been stable.  Findings include normal foveal contour, no IRF, subretinal fluid, vitreomacular adhesion (Small pocket of SRF IN to disc caught on widefield, bullous schisis cavity IT periphery caught on widefield ).   Notes *Images captured and stored on drive  Diagnosis / Impression:  OD: Diffuse SRF -- detached under oil OS: Small pocket of SRF IN to disc caught on widefield, bullous schisis cavity IT periphery caught on widefield   Clinical management:  See below  Abbreviations: NFP - Normal foveal profile. CME - cystoid macular edema. PED - pigment epithelial detachment. IRF - intraretinal fluid. SRF - subretinal fluid. EZ - ellipsoid zone. ERM - epiretinal membrane. ORA - outer retinal atrophy. ORT - outer retinal tubulation. SRHM - subretinal hyper-reflective material            ASSESSMENT/PLAN:    ICD-10-CM   1. Ocular inflammation  H57.89     2. 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 Traction detachment of right retina  H33.41 OCT, Retina - OU - Both Eyes    4. 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 Essential hypertension  I10     8. Hypertensive retinopathy of both eyes  H35.033     9. Combined forms of age-related cataract of both eyes  H25.813      1-3. Ocular inflammation and PVR retinal detachment OD - 10.16.24 pt started seeing new floaters and "sparks" in vision - pt developed progressively worsening vision and vit haze - VA OD decreased to LP despite aggressive anti-inflammatory medical therapy - pt was on PredForte q1h OD, Prolensa QID OD, and 40 mg po Prednisone daily -  b-scan 12.04.24 shows significant mobile vit opacities, ?RD - POW6 s/p PPV/PFO/EL/FAX/SO OD, 12.05.2024  - undiluted vit samples obtained intra-op for gram stain and  culture -- no organisms, but +WBCs - intra-op - clearance of vitreous haze showed extensive fibrosis and PVR tractional detachment -- silicon oil added at end of case - IOP improved to 10 today -- continue brimonidine and Cosopt TID OD - AC shallow with angle closed -- will benefit from an inferior peripheral iridotomy - exam shows residual intraretinal fibrosis and persistent SRF             - cont   PF q2h OD                         PSO ung BID OD   - off po prednisone 40 mg daily -- okay             - avoid laying flat on back             - post op drop and positioning instructions reviewed              - tylenol/ibuprofen for pain  - discussed risk/benefit of repeat PPV to remove scar tissue and repair residual RD -- will monitor for now - f/u 1-2 weeks -- POV, DFE, IOP check - will send pt to Dr. Laruth Bouchard office for possible PI -- pt to go straight from here - pt went back to work on October 17, 2023  4,5. CSCR OD -- chronic  - pt reports decreased vision OD since December 2019  - reports significant stressors in life -- work  - denies steroid use - FA (09.30.20) shows focal hyperfluorescent leakage points nasal macula OD -- expansile dot phenotype? - started 50 mg po eplerenone since 09.30.20  -- pt self d/c in February, re-started in June 2021 - s/p IVA OD #1 (09.01.21), #2 (09.29.21), #3 (11.03.21), #4 (12.03.21) -- IVA resistance - s/p IVE OD #1 (01.20.22), #2 (02.18.22), #3 (04.05.22-sample), #4 (05.23.22), # 5 (06.27.22), #6 (07.28.22), #7 (08.26.22), #8 (09.23.22), #9 (10.21.22), #10 (11.18.22), #11 (12.16.22), #12 (01.20.23), #13 (03.02.23), #14 (04.07.23), #15 (05.12.23), #16 (06.09.23), #17 (07.14.23), #18 (08.08.23), #19 (09.08.23), #20 (10.06.23), #21 (11.06.23), #22 (12.04.23), #23 (01.08.24), #24 (02.12.24), #25 (03.18.24), #26 (04.22.24), #27 (05.20.24) -- IVE resistance ======================================================== - s/p IVV OD #1 (07.02.24), #2 (07.30.24), #3  (08.27.24), #4 (09.26.24), #5 (10.24.24) - OCT -- no images obtained today  - differential includes atypical exudative ARMD - IVV informed consent obtained and signed, 07.02.24  - stop po eplerenone 50 mg daily for now  - monitor for now  6. Retinoschisis OU (OD > OS)  - bullous retinoschisis cavity IT quadrant OD-- 4098-1191 oclock  - bullous retinoschisis cavity IT quadrant OS -- 4782-9562 oclock  - no associated retinal holes/RT/RD on scleral depression   - widefield Optos imaging (04.22.24) shows mild interval progression of schisis cavities' size and posterior extension in comparison to baseline images  - s/p laser retinopexy OS 08.12.24 -- good laser changes surrounding - s/p laser retinopexy OD (10.07.24) -- good early laser changes surrounding - new schisis cavity found on examination OD (10.28.24) - 1030 periphery -- no RT/RD on scleral depressed exam - s/p laser retinopexy OD to new schisis at 1030 on 10.28.24 - monitor  7,8. Hypertensive retinopathy OU  - discussed importance of tight BP control  - monitor  9. Mixed form age related cataracts OU - The symptoms of cataract,  surgical options, and treatments and risks were discussed with patient.  - discussed diagnosis and progression  - monitor  Ophthalmic Meds Ordered this visit:  No orders of the defined types were placed in this encounter.    Return for f/u 1-2 weeks, DFE, OCT.  There are no Patient Instructions on file for this visit.  This document serves as a record of services personally performed by Karie Chimera, MD, PhD. It was created on their behalf by Glee Arvin. Manson Passey, OA an ophthalmic technician. The creation of this record is the provider's dictation and/or activities during the visit.    Electronically signed by: Glee Arvin. Manson Passey, OA 11/03/23 5:01 PM  Karie Chimera, M.D., Ph.D. Diseases & Surgery of the Retina and Vitreous [facility]  I have reviewed the above documentation  for accuracy and completeness, and I agree with the above. Karie Chimera, M.D., Ph.D. 11/03/23 5:14 PM   Abbreviations: M myopia (nearsighted); A astigmatism; H hyperopia (farsighted); P presbyopia; Mrx spectacle prescription;  CTL contact lenses; OD right eye; OS left eye; OU both eyes  XT exotropia; ET esotropia; PEK punctate epithelial keratitis; PEE punctate epithelial erosions; DES dry eye syndrome; MGD meibomian gland dysfunction; ATs artificial tears; PFAT's preservative free artificial tears; NSC nuclear sclerotic cataract; PSC posterior subcapsular cataract; ERM epi-retinal membrane; PVD posterior vitreous detachment; RD retinal detachment; DM diabetes mellitus; DR diabetic retinopathy; NPDR non-proliferative diabetic retinopathy; PDR proliferative diabetic retinopathy; CSME clinically significant macular edema; DME diabetic macular edema; dbh dot blot hemorrhages; CWS cotton wool spot; POAG primary open angle glaucoma; C/D cup-to-disc ratio; HVF humphrey visual field; GVF goldmann visual field; OCT optical coherence tomography; IOP intraocular pressure; BRVO Branch retinal vein occlusion; CRVO central retinal vein occlusion; CRAO central retinal artery occlusion; BRAO branch retinal artery occlusion; RT retinal tear; SB scleral buckle; PPV pars plana vitrectomy; VH Vitreous hemorrhage; PRP panretinal laser photocoagulation; IVK intravitreal kenalog; VMT vitreomacular traction; MH Macular hole;  NVD neovascularization of the disc; NVE neovascularization elsewhere; AREDS age related eye disease study; ARMD age related macular degeneration; POAG primary open angle glaucoma; EBMD epithelial/anterior basement membrane dystrophy; ACIOL anterior chamber intraocular lens; IOL intraocular lens; PCIOL posterior chamber intraocular lens; Phaco/IOL phacoemulsification with intraocular lens placement; PRK photorefractive keratectomy; LASIK laser assisted in situ keratomileusis; HTN hypertension; DM diabetes  mellitus; COPD chronic obstructive pulmonary disease

## 2023-11-08 NOTE — Progress Notes (Signed)
[facility] - Clinic Note  11/10/2023     CHIEF COMPLAINT Patient presents for Retina Follow Up   HISTORY OF PRESENT ILLNESS: [name] is a 53 y.o. female who presents to the clinic today for:  HPI     Retina Follow Up   Patient presents with  Other.  In right eye.  Severity is moderate.  Duration of 2 days.  Since onset it is gradually improving.  I, the attending physician,  performed the HPI with the patient and updated documentation appropriately.        Comments   Patient states the vision is the same. She is using Pred OD q2h, Cosopt OD QID and Brimonidine OD QID.      Last edited by [name], MD on 11/10/2023 10:17 AM.     Pt has seen Dr. Dione Booze twice since she was here last, but her IOP was okay, so Dr. Dione Booze doesn't think she needs a PI at this time, she states she isn't feeling as much pressure in her eye, she is using brim and cosopt BID OD and PF q2h  Referring physician: [name], PA-C [address] [place],  [place] [address]  HISTORICAL INFORMATION:   Selected notes from the MEDICAL RECORD NUMBER Referred by Dr. [name] for concern of sub-macular fluid / retinoschisis   CURRENT MEDICATIONS: Current Outpatient Medications (Ophthalmic Drugs)  Medication Sig   bacitracin-polymyxin b (POLYSPORIN) ophthalmic ointment Place into the right eye 2 (two) times daily. Place a 1/2 inch ribbon of ointment into the lower eyelid.   Bromfenac Sodium 0.09 % SOLN Apply 1 drop to eye in the morning, at noon, in the evening, and at bedtime. 1 drop in the right eye four times a day (Patient not taking: Reported on 11/01/2023)   prednisoLONE acetate (PRED FORTE) 1 % ophthalmic suspension Place 1 drop into the right eye 4 (four) times daily.   prednisoLONE acetate (PRED FORTE) 1 % ophthalmic suspension Place 1 drop into the right eye every 2 (two) hours.   No current facility-administered medications for this  visit. (Ophthalmic Drugs)   Current Outpatient Medications (Other)  Medication Sig   diphenhydrAMINE HCl (BENADRYL ALLERGY PO) Take 25 mg by mouth daily as needed (allergies).   eplerenone (INSPRA) 50 MG tablet Take 50 mg by mouth at bedtime.   hydrochlorothiazide (HYDRODIURIL) 25 MG tablet Take 25 mg by mouth daily.   lisinopril (PRINIVIL,ZESTRIL) 20 MG tablet Take 1 tablet (20 mg total) by mouth daily.   predniSONE (DELTASONE) 20 MG tablet Take 2 tablets (40 mg total) by mouth daily with breakfast. (Patient not taking: Reported on 11/01/2023)   progesterone (PROMETRIUM) 100 MG capsule Take 1 capsule (100 mg total) by mouth at bedtime.   No current facility-administered medications for this visit. (Other)   REVIEW OF SYSTEMS: ROS   Positive for: Cardiovascular, Eyes Negative for: Constitutional, Gastrointestinal, Neurological, Skin, Genitourinary, Musculoskeletal, HENT, Endocrine, Respiratory, Psychiatric, Allergic/Imm, Heme/Lymph Last edited by Charlette Caffey, COT on 11/10/2023  7:36 AM.     ALLERGIES No Known Allergies  PAST MEDICAL HISTORY Past Medical History:  Diagnosis Date   Cervical dysplasia    CIN I (cervical intraepithelial neoplasia I)    High risk HPV infection    History of abnormal cervical Pap smear    12-28-21 lgsil HPV HR +   History of adenomatous polyp of colon    Hypertension    Hypertensive retinopathy  OU   Macular degeneration, right eye    Retinal defect, 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   followed by dr [name];  pt takes  inspra for excess fluid in retina,   Sciatic nerve injury    Seasonal allergies    Vaginal dysplasia    Wears glasses    Past Surgical History:  Procedure Laterality Date   CERVICAL CONIZATION W/BX N/A 06/30/2023   Procedure: CONIZATION CERVIX WITH BIOPSY, ENDOCERVICAL CURETTAGE;  Surgeon: Carver Fila, MD;  Location: [facility];  Service: Gynecology;  Laterality: N/A;   CO2 LASER APPLICATION N/A 06/30/2023    Procedure: CO2 LASER TO THE VAGINA;  Surgeon: Carver Fila, MD;  Location[facility];  Service: Gynecology;  Laterality: N/A;   COLONOSCOPY  12/14/2022   HYSTEROSCOPY WITH RESECTOSCOPE  11/25/2006   @[facility]  by dr [name]. [name];   Resectoscopic myomectomy and polypectomy   INJECTION OF SILICONE OIL Right 09/15/2023   Procedure: INJECTION OF SILICONE OIL;  Surgeon: [name], MD;  Location: [facility] OR;  Service: Ophthalmology;  Laterality: Right;   PARS PLANA VITRECTOMY Right 09/15/2023   Procedure: PARS PLANA VITRECTOMY WITH 25 GAUGE;  Surgeon: [name], MD;  Location: [facility] OR;  Service: Ophthalmology;  Laterality: Right;   VAGINECTOMY, PARTIAL N/A 06/30/2023   Procedure: SMALL VAGINECTOMY, PARTIAL BIOSPIES;  Surgeon: Carver Fila, MD;  Location: [facility];  Service: Gynecology;  Laterality: N/A;   FAMILY HISTORY Family History  Problem Relation Age of Onset   Hypertension Mother    Diabetes Father    Diabetes Maternal Grandmother    Hypertension Maternal Grandmother    Colon cancer Neg Hx    Colon polyps Neg Hx    Esophageal cancer Neg Hx    Rectal cancer Neg Hx    Stomach cancer Neg Hx    SOCIAL HISTORY Social History   Tobacco Use   Smoking status: Never   Smokeless tobacco: Never  Vaping Use   Vaping status: Never Used  Substance Use Topics   Alcohol use: No    Alcohol/week: 0.0 standard drinks of alcohol   Drug use: Never       OPHTHALMIC EXAM: Base Eye Exam     Visual Acuity (Snellen - Linear)       Right Left   Dist cc CF at 3' 20/20   Dist ph cc NI     Correction: Glasses         Tonometry (Tonopen, 7:39 AM)       Right Left   Pressure 13 14         Pupils       Dark Light Shape React APD   Right 5 5 Round NR None   Left 5 5 Round NR None         Visual Fields       Left Right    Full    Restrictions  Total inferior nasal deficiency; Partial inner superior temporal, inferior temporal, superior  nasal deficiencies         Extraocular Movement       Right Left    Full, Ortho Full, Ortho         Neuro/Psych     Oriented x3: Yes   Mood/Affect: Normal         Dilation     Both eyes: 1.0% Mydriacyl, 2.5% Phenylephrine @ 7:37 AM           Slit Lamp and Fundus Exam  Slit Lamp Exam       Right Left   Lids/Lashes Dermatochalasis - upper lid Dermatochalasis - upper lid   Conjunctiva/Sclera Inflamed pterygium with trace injxn, sutures dissolving 1+ Injection, nasal Pinguecula   Cornea Nasal ptergyium extending 2.36mm onto nasal cornea, fine endo pigment, 1+ Punctate epithelial erosions, tear film debris 1+Punctate epithelial erosions   Anterior Chamber Shallow with closed angels, 1-2+cell/pigment deep, narrow temporal angle, 1+pigment   Iris Round and dilated, IK touch 360 Round and dilated   Lens 2-3+ Nuclear sclerosis, 2-3+ Cortical cataract, +pigment deposition on anterior capsule (broken synechia) 2+ Nuclear sclerosis, 2+ Cortical cataract   Anterior Vitreous post vitrectomy, 95% silicone oil fill Mild Vitreous syneresis         Fundus Exam       Right Left   Disc mild pallor, sharp rim, +fibrosis Pink and Sharp, +cupping, shallow peripapillary SRF just nasal to disc   C/D Ratio 0.3 0.6   Macula tractional fibrosis temporal mac; Shallow SRF under oil, +macular dragging with striae Flat, good foveal reflex, Epiretinal membrane, mild RPE mottling and clumping superiorly, No heme or edema   Vessels Attenuated, tortuous; fibrosis Vascular attenuation, mild Tortuousity   Periphery 360 laser, +SRF temporal periphery with +fibrosis -- dense fibrosis temporal periphery (0900) Attached, temporal retinoschisis cavity from 0300-0430 peripherally -- stable from prior -- with good early laser changes posterior and surrounding, pigmented cystoid degeneration inferiorly           Refraction     Wearing Rx       Sphere Cylinder Axis   Right +1.25 +1.25 112   Left  +2.25 +1.00 101           IMAGING AND PROCEDURES  Imaging and Procedures for @TODAY @  OCT, Retina - OU - Both Eyes       Right Eye Quality was good. Central Foveal Thickness: 404. Progression has been stable. Findings include no IRF, subretinal hyper-reflective material, epiretinal membrane, pigment epithelial detachment, subretinal fluid (Poor image quality; diffuse SRF -- detached under oil ).   Left Eye Quality was good. Central Foveal Thickness: 300. Progression has been stable. Findings include normal foveal contour, no IRF, subretinal fluid, vitreomacular adhesion (Shallow sliver of SRF IN to disc caught on widefield -- improved, bullous schisis cavity IT periphery caught on widefield ).   Notes *Images captured and stored on drive  Diagnosis / Impression:  OD: Diffuse SRF -- detached under oil OS: shallow sliver of SRF IN to disc caught on widefield -- improved, bullous schisis cavity IT periphery caught on widefield -- not imaged today  Clinical management:  See below  Abbreviations: NFP - Normal foveal profile. CME - cystoid macular edema. PED - pigment epithelial detachment. IRF - intraretinal fluid. SRF - subretinal fluid. EZ - ellipsoid zone. ERM - epiretinal membrane. ORA - outer retinal atrophy. ORT - outer retinal tubulation. SRHM - subretinal hyper-reflective material            ASSESSMENT/PLAN:    ICD-10-CM   1. Ocular inflammation  H57.89     2. 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 Traction detachment of right retina  H33.41 OCT, Retina - OU - Both Eyes    4. 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 Exudative age-related macular degeneration of right eye with active choroidal neovascularization (HCC)  H35.3211     6. Bilateral retinoschisis  H33.103  7. Essential hypertension  I10     8. Hypertensive retinopathy of both eyes  H35.033     9. Combined forms of age-related  cataract of both eyes  H25.813      1-3. Ocular inflammation and PVR retinal detachment OD - 10.16.24 pt started seeing new floaters and "sparks" in vision - pt developed progressively worsening vision and vit haze - VA OD decreased to LP despite aggressive anti-inflammatory medical therapy - pt was on PredForte q1h OD, Prolensa QID OD, and 40 mg po Prednisone daily - b-scan 12.04.24 shows significant mobile vit opacities, ?RD - POW8 s/p PPV/PFO/EL/FAX/SO OD, 12.05.2024  - undiluted vit samples obtained intra-op for gram stain and culture -- no organisms, but +WBCs - intra-op - clearance of vitreous haze showed extensive fibrosis and PVR tractional detachment -- silicon oil added at end of case - IOP OD 13 -- continue brimonidine and Cosopt TID OD - AC shallow with angle closed -- will benefit from an inferior peripheral iridotomy, but AC too shallow to complete - exam shows residual intraretinal fibrosis and persistent SRF             - cont   PF q2h OD                         PSO ung BID OD   - cont brimondidine and Cosopt TID OD             - avoid laying flat on back             - post op drop and positioning instructions reviewed              - tylenol/ibuprofen for pain  - discussed potential repeat PPV to remove scar tissue, repair residual RD and create a peripheral iridotomy -- tentatively planning second surgery for Thursday, February 20th - f/u 1 wk -- POV, DFE, IOP check - pt went back to work on October 17, 2023 -- doing well  4,5. CSCR OD -- chronic  - pt reports decreased vision OD since December 2019  - reports significant stressors in life -- work  - denies steroid use - FA (09.30.20) shows focal hyperfluorescent leakage points nasal macula OD -- expansile dot phenotype? - started 50 mg po eplerenone since 09.30.20  -- pt self d/c in February, re-started in June 2021 - s/p IVA OD #1 (09.01.21), #2 (09.29.21), #3 (11.03.21), #4 (12.03.21) -- IVA resistance - s/p IVE OD  #1 (01.20.22), #2 (02.18.22), #3 (04.05.22-sample), #4 (05.23.22), # 5 (06.27.22), #6 (07.28.22), #7 (08.26.22), #8 (09.23.22), #9 (10.21.22), #10 (11.18.22), #11 (12.16.22), #12 (01.20.23), #13 (03.02.23), #14 (04.07.23), #15 (05.12.23), #16 (06.09.23), #17 (07.14.23), #18 (08.08.23), #19 (09.08.23), #20 (10.06.23), #21 (11.06.23), #22 (12.04.23), #23 (01.08.24), #24 (02.12.24), #25 (03.18.24), #26 (04.22.24), #27 (05.20.24) -- IVE resistance ======================================================== - s/p IVV OD #1 (07.02.24), #2 (07.30.24), #3 (08.27.24), #4 (09.26.24), #5 (10.24.24) - OCT -- no images obtained today  - differential includes atypical exudative ARMD - IVV informed consent obtained and signed, 07.02.24  - stop po eplerenone 50 mg daily for now  - monitor for now  6. Retinoschisis OU (OD > OS)  - bullous retinoschisis cavity IT quadrant OD-- 9604-5409 oclock  - bullous retinoschisis cavity IT quadrant OS -- 8119-1478 oclock  - no associated retinal holes/RT/RD on scleral depression   - widefield Optos imaging (04.22.24) shows mild interval progression of schisis cavities' size and posterior extension in comparison to baseline  images  - s/p laser retinopexy OS 08.12.24 -- good laser changes surrounding - s/p laser retinopexy OD (10.07.24) -- good early laser changes surrounding - new schisis cavity found on examination OD (10.28.24) - 1030 periphery -- no RT/RD on scleral depressed exam - s/p laser retinopexy OD to new schisis at 1030 on 10.28.24 - monitor  7,8. Hypertensive retinopathy OU  - discussed importance of tight BP control  - monitor  9. Mixed form age related cataracts OU - The symptoms of cataract, surgical options, and treatments and risks were discussed with patient.  - discussed diagnosis and progression  - monitor  Ophthalmic Meds Ordered this visit:  No orders of the defined types were placed in this encounter.    Return in about 1 week (around  11/17/2023) for f/u RD OD, DFE, OCT.  There are no Patient Instructions on file for this visit.  This document serves as a record of services personally performed by Karie Chimera, MD, PhD. It was created on their behalf by Annalee Genta, COMT. The creation of this record is the provider's dictation and/or activities during the visit.  Electronically signed by: Annalee Genta, COMT 11/10/23 10:17 AM  This document serves as a record of services personally performed by Karie Chimera, MD, PhD. It was created on their behalf by Glee Arvin. Manson Passey, OA an ophthalmic technician. The creation of this record is the provider's dictation and/or activities during the visit.    Electronically signed by: Glee Arvin. Manson Passey, OA 11/10/23 10:17 AM  Karie Chimera, M.D., Ph.D. Diseases & Surgery of the Retina and Vitreous [facility]  I have reviewed the above documentation for accuracy and completeness, and I agree with the above. Karie Chimera, M.D., Ph.D. 11/10/23 10:29 AM   Abbreviations: M myopia (nearsighted); A astigmatism; H hyperopia (farsighted); P presbyopia; Mrx spectacle prescription;  CTL contact lenses; OD right eye; OS left eye; OU both eyes  XT exotropia; ET esotropia; PEK punctate epithelial keratitis; PEE punctate epithelial erosions; DES dry eye syndrome; MGD meibomian gland dysfunction; ATs artificial tears; PFAT's preservative free artificial tears; NSC nuclear sclerotic cataract; PSC posterior subcapsular cataract; ERM epi-retinal membrane; PVD posterior vitreous detachment; RD retinal detachment; DM diabetes mellitus; DR diabetic retinopathy; NPDR non-proliferative diabetic retinopathy; PDR proliferative diabetic retinopathy; CSME clinically significant macular edema; DME diabetic macular edema; dbh dot blot hemorrhages; CWS cotton wool spot; POAG primary open angle glaucoma; C/D cup-to-disc ratio; HVF humphrey visual field; GVF goldmann visual field; OCT optical  coherence tomography; IOP intraocular pressure; BRVO Branch retinal vein occlusion; CRVO central retinal vein occlusion; CRAO central retinal artery occlusion; BRAO branch retinal artery occlusion; RT retinal tear; SB scleral buckle; PPV pars plana vitrectomy; VH Vitreous hemorrhage; PRP panretinal laser photocoagulation; IVK intravitreal kenalog; VMT vitreomacular traction; MH Macular hole;  NVD neovascularization of the disc; NVE neovascularization elsewhere; AREDS age related eye disease study; ARMD age related macular degeneration; POAG primary open angle glaucoma; EBMD epithelial/anterior basement membrane dystrophy; ACIOL anterior chamber intraocular lens; IOL intraocular lens; PCIOL posterior chamber intraocular lens; Phaco/IOL phacoemulsification with intraocular lens placement; PRK photorefractive keratectomy; LASIK laser assisted in situ keratomileusis; HTN hypertension; DM diabetes mellitus; COPD chronic obstructive pulmonary disease

## 2023-11-10 ENCOUNTER — Ambulatory Visit (INDEPENDENT_AMBULATORY_CARE_PROVIDER_SITE_OTHER): Payer: Commercial Managed Care - PPO | Admitting: Ophthalmology

## 2023-11-10 ENCOUNTER — Encounter (INDEPENDENT_AMBULATORY_CARE_PROVIDER_SITE_OTHER): Payer: Self-pay | Admitting: Ophthalmology

## 2023-11-10 DIAGNOSIS — H3521 Other non-diabetic proliferative retinopathy, right eye: Secondary | ICD-10-CM

## 2023-11-10 DIAGNOSIS — H25813 Combined forms of age-related cataract, bilateral: Secondary | ICD-10-CM

## 2023-11-10 DIAGNOSIS — H3341 Traction detachment of retina, right eye: Secondary | ICD-10-CM

## 2023-11-10 DIAGNOSIS — H35033 Hypertensive retinopathy, bilateral: Secondary | ICD-10-CM

## 2023-11-10 DIAGNOSIS — H35711 Central serous chorioretinopathy, right eye: Secondary | ICD-10-CM

## 2023-11-10 DIAGNOSIS — H5789 Other specified disorders of eye and adnexa: Secondary | ICD-10-CM

## 2023-11-10 DIAGNOSIS — H33103 Unspecified retinoschisis, bilateral: Secondary | ICD-10-CM

## 2023-11-10 DIAGNOSIS — H353211 Exudative age-related macular degeneration, right eye, with active choroidal neovascularization: Secondary | ICD-10-CM | POA: Diagnosis not present

## 2023-11-10 DIAGNOSIS — I10 Essential (primary) hypertension: Secondary | ICD-10-CM

## 2023-11-15 NOTE — Progress Notes (Signed)
 [facility] - Clinic Note  11/17/2023     CHIEF COMPLAINT Patient presents for Retina Follow Up   HISTORY OF PRESENT ILLNESS: [name] is a 52 y.o. female who presents to the clinic today for:  HPI     Retina Follow Up   Patient presents with  Other.  In right eye.  Severity is moderate.  Duration of 2 days.  Since onset it is gradually improving.  I, the attending physician,  performed the HPI with the patient and updated documentation appropriately.        Comments   Patient states the vision is the same. She is using PF q2h OD, PSO ung BID OD, brimonidine  and Cosopt  TID OD.       Last edited by Valdemar Rogue, MD on 11/17/2023 10:39 AM.    Pt states her eye feels okay, no pain   Referring physician: Mason Haywood LABOR, PA-C [address] [place],  [place] [address]  HISTORICAL INFORMATION:   Selected notes from the MEDICAL RECORD NUMBER Referred by Dr. Devere Kitty for concern of sub-macular fluid / retinoschisis   CURRENT MEDICATIONS: Current Outpatient Medications (Ophthalmic Drugs)  Medication Sig   bacitracin -polymyxin b  (POLYSPORIN ) ophthalmic ointment Place into the right eye 2 (two) times daily. Place a 1/2 inch ribbon of ointment into the lower eyelid.   Bromfenac  Sodium 0.09 % SOLN Apply 1 drop to eye in the morning, at noon, in the evening, and at bedtime. 1 drop in the right eye four times a day (Patient not taking: Reported on 11/01/2023)   prednisoLONE  acetate (PRED FORTE ) 1 % ophthalmic suspension Place 1 drop into the right eye 4 (four) times daily.   prednisoLONE  acetate (PRED FORTE ) 1 % ophthalmic suspension Place 1 drop into the right eye every 2 (two) hours.   No current facility-administered medications for this visit. (Ophthalmic Drugs)   Current Outpatient Medications (Other)  Medication Sig   diphenhydrAMINE HCl (BENADRYL ALLERGY PO) Take 25 mg by mouth daily as needed (allergies).   eplerenone   (INSPRA ) 50 MG tablet Take 50 mg by mouth at bedtime.   hydrochlorothiazide  (HYDRODIURIL ) 25 MG tablet Take 25 mg by mouth daily.   lisinopril  (PRINIVIL ,ZESTRIL ) 20 MG tablet Take 1 tablet (20 mg total) by mouth daily.   predniSONE  (DELTASONE ) 20 MG tablet Take 2 tablets (40 mg total) by mouth daily with breakfast. (Patient not taking: Reported on 11/01/2023)   progesterone  (PROMETRIUM ) 100 MG capsule Take 1 capsule (100 mg total) by mouth at bedtime.   No current facility-administered medications for this visit. (Other)   REVIEW OF SYSTEMS: ROS   Positive for: Cardiovascular, Eyes Negative for: Constitutional, Gastrointestinal, Neurological, Skin, Genitourinary, Musculoskeletal, HENT, Endocrine, Respiratory, Psychiatric, Allergic/Imm, Heme/Lymph Last edited by Myra Wanda SAILOR, COT on 11/17/2023  7:37 AM.     ALLERGIES No Known Allergies  PAST MEDICAL HISTORY 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   followed by dr Cniyah Sproull;  pt takes  inspra  for excess fluid in retina,   Sciatic nerve injury    Seasonal  allergies    Vaginal dysplasia    Wears glasses    Past Surgical History:  Procedure Laterality Date   CERVICAL CONIZATION W/BX N/A 06/30/2023   Procedure: CONIZATION CERVIX WITH BIOPSY, ENDOCERVICAL CURETTAGE;  Surgeon: Viktoria Comer SAUNDERS, MD;  Location: [facility];  Service: Gynecology;  Laterality: N/A;   CO2 LASER APPLICATION N/A 06/30/2023   Procedure: CO2 LASER TO THE VAGINA;  Surgeon: Viktoria Comer SAUNDERS, MD;  Location[facility];  Service: Gynecology;  Laterality: N/A;   COLONOSCOPY  12/14/2022   HYSTEROSCOPY WITH  RESECTOSCOPE  11/25/2006   @[facility]  by dr jinny. winfred;   Resectoscopic myomectomy and polypectomy   INJECTION OF SILICONE OIL Right 09/15/2023   Procedure: INJECTION OF SILICONE OIL;  Surgeon: Valdemar Rogue, MD;  Location: [facility] OR;  Service: Ophthalmology;  Laterality: Right;   PARS PLANA VITRECTOMY Right 09/15/2023   Procedure: PARS PLANA VITRECTOMY WITH 25 GAUGE;  Surgeon: Valdemar Rogue, MD;  Location: [facility] OR;  Service: Ophthalmology;  Laterality: Right;   VAGINECTOMY, PARTIAL N/A 06/30/2023   Procedure: SMALL VAGINECTOMY, PARTIAL BIOSPIES;  Surgeon: Viktoria Comer SAUNDERS, MD;  Location: [facility];  Service: Gynecology;  Laterality: N/A;   FAMILY HISTORY Family History  Problem Relation Age of Onset   Hypertension Mother    Diabetes Father    Diabetes Maternal Grandmother    Hypertension Maternal Grandmother    Colon cancer Neg Hx    Colon polyps Neg Hx    Esophageal cancer Neg Hx    Rectal cancer Neg Hx    Stomach cancer Neg Hx    SOCIAL HISTORY Social History   Tobacco Use   Smoking status: Never   Smokeless tobacco: Never  Vaping Use   Vaping status: Never Used  Substance Use Topics   Alcohol use: No    Alcohol/week: 0.0 standard drinks of alcohol   Drug use: Never       OPHTHALMIC EXAM: Base Eye Exam     Visual Acuity (Snellen - Linear)       Right Left   Dist cc CF at 3' 20/20   Dist ph cc NI     Correction: Glasses         Tonometry (Tonopen, 7:40 AM)       Right Left   Pressure 9 12         Pupils       Dark Light Shape React APD   Right 5 5 Round NR None   Left 4 4 Round NR None         Visual Fields       Left Right    Full    Restrictions  Partial outer superior temporal, inferior temporal deficiencies         Extraocular Movement       Right Left    Full, Ortho Full, Ortho         Neuro/Psych     Oriented x3: Yes   Mood/Affect: Normal         Dilation     Both eyes: 1.0% Mydriacyl , 2.5% Phenylephrine  @  7:38 AM           Slit Lamp and Fundus Exam     Slit Lamp Exam       Right Left   Lids/Lashes Dermatochalasis - upper lid Dermatochalasis - upper lid   Conjunctiva/Sclera nasal pinguecula 1+ Injection, nasal Pinguecula   Cornea Nasal ptergyium extending 2.75mm onto nasal cornea, fine endo  pigment, 1+ Punctate epithelial erosions, tear film debris 1+Punctate epithelial erosions   Anterior Chamber Shallow with closed angels, 1+cell/pigment deep, narrow temporal angle, 1+pigment   Iris Round and dilated, IK touch 360, +PS Round and dilated   Lens 3+ Nuclear sclerosis, 3+ Cortical cataract, +pigment deposition on anterior capsule (broken synechia) 2+ Nuclear sclerosis, 2+ Cortical cataract   Anterior Vitreous post vitrectomy, 95% silicone oil fill Mild Vitreous syneresis         Fundus Exam       Right Left   Disc mild pallor, sharp rim, +fibrosis Pink and Sharp, +cupping, shallow peripapillary SRF just nasal to disc   C/D Ratio 0.3 0.6   Macula tractional fibrosis temporal mac; Shallow SRF under oil, +macular dragging with striae Flat, good foveal reflex, Epiretinal membrane, mild RPE mottling and clumping superiorly, No heme or edema   Vessels Attenuated, tortuous; fibrosis Vascular attenuation, mild Tortuousity   Periphery 360 laser, +SRF temporal periphery with +fibrosis -- dense fibrosis temporal periphery (0900) Attached, temporal retinoschisis cavity from 0300-0430 peripherally -- stable from prior -- with good early laser changes posterior and surrounding, pigmented cystoid degeneration inferiorly           IMAGING AND PROCEDURES  Imaging and Procedures for @TODAY @  OCT, Retina - OU - Both Eyes       Right Eye Quality was poor. Central Foveal Thickness: 635. Progression has been stable. Findings include no IRF, subretinal hyper-reflective material, epiretinal membrane, pigment epithelial detachment, subretinal fluid (Poor image quality; diffuse SRF -- detached under oil  ).   Left Eye Quality was good. Central Foveal Thickness: 302. Progression has improved. Findings include normal foveal contour, no IRF, subretinal fluid, vitreomacular adhesion (Shallow sliver of SRF IN to disc caught on widefield -- improved, bullous schisis cavity IT periphery caught on widefield ).   Notes *Images captured and stored on drive  Diagnosis / Impression:  OD: Diffuse SRF, +intraretinal fibrosis -- detached under oil OS: shallow sliver of SRF IN to disc caught on widefield -- improved, bullous schisis cavity IT periphery caught on widefield -- not imaged today  Clinical management:  See below  Abbreviations: NFP - Normal foveal profile. CME - cystoid macular edema. PED - pigment epithelial detachment. IRF - intraretinal fluid. SRF - subretinal fluid. EZ - ellipsoid zone. ERM - epiretinal membrane. ORA - outer retinal atrophy. ORT - outer retinal tubulation. SRHM - subretinal hyper-reflective material            ASSESSMENT/PLAN:    ICD-10-CM   1. Ocular inflammation  H57.89     2. 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 Traction detachment of right retina  H33.41     4. 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 Essential hypertension  I10     8. Hypertensive retinopathy of both eyes  H35.033     9. Combined forms of age-related cataract of both eyes  H25.813      1-3. Ocular inflammation and PVR retinal detachment OD - 10.16.24 pt started seeing new floaters and sparks in vision - pt developed progressively worsening vision and vit haze - VA OD decreased to LP despite aggressive anti-inflammatory medical therapy - pt was on PredForte q1h OD, Prolensa  QID OD,  and 40 mg po Prednisone  daily - b-scan 12.04.24 shows significant  mobile vit opacities, ?RD - s/p PPV/PFO/EL/FAX/SO OD, 12.05.2024  - undiluted vit samples obtained intra-op for gram stain and culture -- no organisms, but +WBCs - intra-op - clearance of vitreous haze showed extensive fibrosis and PVR tractional detachment -- silicon oil added at end of case - IOP OD 9 -- continue brimonidine  and Cosopt  TID OD - AC shallow with angle closed -- will benefit from an inferior peripheral iridotomy, but AC too shallow to complete - exam shows residual intraretinal fibrosis and persistent SRF             - cont   PF q2h OD                         PSO ung BID OD   - cont brimondidine and Cosopt  TID OD             - avoid laying flat on back             - post op drop and positioning instructions reviewed              - tylenol /ibuprofen  for pain  - discussed potential repeat PPV to remove scar tissue, repair residual RD and create a peripheral iridotomy -- will hold off on surgery for now -- will discuss possible cataract sx with Dr. Octavia first  - f/u 2 wk -- POV, DFE, IOP check - pt went back to work on October 17, 2023 -- doing well  4,5. CSCR OD -- chronic  - pt reports decreased vision OD since December 2019  - reports significant stressors in life -- work  - denies steroid use - FA (09.30.20) shows focal hyperfluorescent leakage points nasal macula OD -- expansile dot phenotype? - started 50 mg po eplerenone  since 09.30.20  -- pt self d/c in February, re-started in June 2021 - s/p IVA OD #1 (09.01.21), #2 (09.29.21), #3 (11.03.21), #4 (12.03.21) -- IVA resistance - s/p IVE OD #1 (01.20.22), #2 (02.18.22), #3 (04.05.22-sample), #4 (05.23.22), # 5 (06.27.22), #6 (07.28.22), #7 (08.26.22), #8 (09.23.22), #9 (10.21.22), #10 (11.18.22), #11 (12.16.22), #12 (01.20.23), #13 (03.02.23), #14 (04.07.23), #15 (05.12.23), #16 (06.09.23), #17 (07.14.23), #18 (08.08.23), #19 (09.08.23), #20 (10.06.23), #21 (11.06.23), #22 (12.04.23), #23 (01.08.24), #24 (02.12.24), #25  (03.18.24), #26 (04.22.24), #27 (05.20.24) -- IVE resistance ======================================================== - s/p IVV OD #1 (07.02.24), #2 (07.30.24), #3 (08.27.24), #4 (09.26.24), #5 (10.24.24) - OCT -- no images obtained today  - differential includes atypical exudative ARMD - IVV informed consent obtained and signed, 07.02.24  - stop po eplerenone  50 mg daily for now  - monitor for now  6. Retinoschisis OU (OD > OS)  - bullous retinoschisis cavity IT quadrant OD-- 9299-9169 oclock  - bullous retinoschisis cavity IT quadrant OS -- 9669-9569 oclock  - no associated retinal holes/RT/RD on scleral depression   - widefield Optos imaging (04.22.24) shows mild interval progression of schisis cavities' size and posterior extension in comparison to baseline images  - s/p laser retinopexy OS 08.12.24 -- good laser changes surrounding - s/p laser retinopexy OD (10.07.24) -- good early laser changes surrounding - new schisis cavity found on examination OD (10.28.24) - 1030 periphery -- no RT/RD on scleral depressed exam - s/p laser retinopexy OD to new schisis at 1030 on 10.28.24 - monitor  7,8. Hypertensive retinopathy OU  - discussed importance of tight BP control  - monitor  9. Mixed  form age related cataracts OU - The symptoms of cataract, surgical options, and treatments and risks were discussed with patient.  - discussed diagnosis and progression, specifically post-vit progression  - discussed case with Dr. Octavia -- will refer back for cataract surgery OD after next follow up  Ophthalmic Meds Ordered this visit:  No orders of the defined types were placed in this encounter.    Return in about 2 weeks (around 12/01/2023) for f/u RD OD, DFE, OCT.  There are no Patient Instructions on file for this visit.  This document serves as a record of services personally performed by Redell JUDITHANN Hans, MD, PhD. It was created on their behalf by Auston Muzzy, COMT. The creation of this  record is the provider's dictation and/or activities during the visit.  Electronically signed by: Auston Muzzy, COMT 11/17/23 10:39 AM  This document serves as a record of services personally performed by Redell JUDITHANN Hans, MD, PhD. It was created on their behalf by Alan PARAS. Delores, OA an ophthalmic technician. The creation of this record is the provider's dictation and/or activities during the visit.    Electronically signed by: Alan PARAS. Delores, OA 11/17/23 10:39 AM  Redell JUDITHANN Hans, M.D., Ph.D. Diseases & Surgery of the Retina and Vitreous [facility]  I have reviewed the above documentation for accuracy and completeness, and I agree with the above. Redell JUDITHANN Hans, M.D., Ph.D. 11/17/23 10:43 AM   Abbreviations: M myopia (nearsighted); A astigmatism; H hyperopia (farsighted); P presbyopia; Mrx spectacle prescription;  CTL contact lenses; OD right eye; OS left eye; OU both eyes  XT exotropia; ET esotropia; PEK punctate epithelial keratitis; PEE punctate epithelial erosions; DES dry eye syndrome; MGD meibomian gland dysfunction; ATs artificial tears; PFAT's preservative free artificial tears; NSC nuclear sclerotic cataract; PSC posterior subcapsular cataract; ERM epi-retinal membrane; PVD posterior vitreous detachment; RD retinal detachment; DM diabetes mellitus; DR diabetic retinopathy; NPDR non-proliferative diabetic retinopathy; PDR proliferative diabetic retinopathy; CSME clinically significant macular edema; DME diabetic macular edema; dbh dot blot hemorrhages; CWS cotton wool spot; POAG primary open angle glaucoma; C/D cup-to-disc ratio; HVF humphrey visual field; GVF goldmann visual field; OCT optical coherence tomography; IOP intraocular pressure; BRVO Branch retinal vein occlusion; CRVO central retinal vein occlusion; CRAO central retinal artery occlusion; BRAO branch retinal artery occlusion; RT retinal tear; SB scleral buckle; PPV pars plana vitrectomy; VH Vitreous  hemorrhage; PRP panretinal laser photocoagulation; IVK intravitreal kenalog ; VMT vitreomacular traction; MH Macular hole;  NVD neovascularization of the disc; NVE neovascularization elsewhere; AREDS age related eye disease study; ARMD age related macular degeneration; POAG primary open angle glaucoma; EBMD epithelial/anterior basement membrane dystrophy; ACIOL anterior chamber intraocular lens; IOL intraocular lens; PCIOL posterior chamber intraocular lens; Phaco/IOL phacoemulsification with intraocular lens placement; PRK photorefractive keratectomy; LASIK laser assisted in situ keratomileusis; HTN hypertension; DM diabetes mellitus; COPD chronic obstructive pulmonary disease

## 2023-11-17 ENCOUNTER — Encounter (INDEPENDENT_AMBULATORY_CARE_PROVIDER_SITE_OTHER): Payer: Self-pay | Admitting: Ophthalmology

## 2023-11-17 ENCOUNTER — Ambulatory Visit (INDEPENDENT_AMBULATORY_CARE_PROVIDER_SITE_OTHER): Payer: Commercial Managed Care - PPO | Admitting: Ophthalmology

## 2023-11-17 DIAGNOSIS — H353211 Exudative age-related macular degeneration, right eye, with active choroidal neovascularization: Secondary | ICD-10-CM | POA: Diagnosis not present

## 2023-11-17 DIAGNOSIS — H3341 Traction detachment of retina, right eye: Secondary | ICD-10-CM

## 2023-11-17 DIAGNOSIS — H3521 Other non-diabetic proliferative retinopathy, right eye: Secondary | ICD-10-CM | POA: Diagnosis not present

## 2023-11-17 DIAGNOSIS — I10 Essential (primary) hypertension: Secondary | ICD-10-CM

## 2023-11-17 DIAGNOSIS — H35711 Central serous chorioretinopathy, right eye: Secondary | ICD-10-CM | POA: Diagnosis not present

## 2023-11-17 DIAGNOSIS — H25813 Combined forms of age-related cataract, bilateral: Secondary | ICD-10-CM

## 2023-11-17 DIAGNOSIS — H33103 Unspecified retinoschisis, bilateral: Secondary | ICD-10-CM

## 2023-11-17 DIAGNOSIS — H35033 Hypertensive retinopathy, bilateral: Secondary | ICD-10-CM

## 2023-11-17 DIAGNOSIS — H5789 Other specified disorders of eye and adnexa: Secondary | ICD-10-CM

## 2023-12-01 ENCOUNTER — Encounter (INDEPENDENT_AMBULATORY_CARE_PROVIDER_SITE_OTHER): Payer: Commercial Managed Care - PPO | Admitting: Ophthalmology

## 2023-12-05 NOTE — Progress Notes (Signed)
 [facility] - Clinic Note  12/07/2023     CHIEF COMPLAINT Patient presents for Retina Follow Up   HISTORY OF PRESENT ILLNESS: [name] is a 53 y.o. female who presents to the clinic today for:  HPI     Retina Follow Up   Patient presents with  Retinal Break/Detachment.  In right eye.  This started 2 weeks ago.  Duration of 2 weeks.  Since onset it is stable.  I, the attending physician,  performed the HPI with the patient and updated documentation appropriately.        Comments   2 week retina follow up RD OD pt is reporting vision maybe little better she denies any flashes or floaters she is using PF q2h OD PSO ung BID OD brimondidine and Cosopt TID OD       Last edited by [name], MD on 12/08/2023  8:13 AM.    Pt states she has been working and it has not hurt her eye, she states her family is coming in May and she would like to have cataract sx then so she is not by herself   Referring physician: [name], PA-C [address] [place],  [place] [address]  HISTORICAL INFORMATION:   Selected notes from the MEDICAL RECORD NUMBER Referred by Dr. [name] for concern of sub-macular fluid / retinoschisis   CURRENT MEDICATIONS: Current Outpatient Medications (Ophthalmic Drugs)  Medication Sig   brimonidine (ALPHAGAN) 0.2 % ophthalmic solution Place 1 drop into the right eye 2 (two) times daily.   bacitracin-polymyxin b (POLYSPORIN) ophthalmic ointment Place into the right eye 2 (two) times daily. Place a 1/2 inch ribbon of ointment into the lower eyelid.   Bromfenac Sodium 0.09 % SOLN Apply 1 drop to eye in the morning, at noon, in the evening, and at bedtime. 1 drop in the right eye four times a day (Patient not taking: Reported on 11/01/2023)   prednisoLONE acetate (PRED FORTE) 1 % ophthalmic suspension Place 1 drop into the right eye 4 (four) times daily.   prednisoLONE acetate (PRED FORTE) 1 % ophthalmic  suspension Place 1 drop into the right eye every 2 (two) hours.   No current facility-administered medications for this visit. (Ophthalmic Drugs)   Current Outpatient Medications (Other)  Medication Sig   diphenhydrAMINE HCl (BENADRYL ALLERGY PO) Take 25 mg by mouth daily as needed (allergies).   eplerenone (INSPRA) 50 MG tablet Take 50 mg by mouth at bedtime.   hydrochlorothiazide (HYDRODIURIL) 25 MG tablet Take 25 mg by mouth daily.   lisinopril (PRINIVIL,ZESTRIL) 20 MG tablet Take 1 tablet (20 mg total) by mouth daily.   predniSONE (DELTASONE) 20 MG tablet Take 2 tablets (40 mg total) by mouth daily with breakfast. (Patient not taking: Reported on 11/01/2023)   progesterone (PROMETRIUM) 100 MG capsule Take 1 capsule (100 mg total) by mouth at bedtime.   No current facility-administered medications for this visit. (Other)   REVIEW OF SYSTEMS: ROS   Positive for: Cardiovascular, Eyes Negative for: Constitutional, Gastrointestinal, Neurological, Skin, Genitourinary, Musculoskeletal, HENT, Endocrine, Respiratory, Psychiatric, Allergic/Imm, Heme/Lymph Last edited by Etheleen Mayhew, COT on 12/07/2023  2:50 PM.     ALLERGIES No Known Allergies  PAST MEDICAL HISTORY Past Medical History:  Diagnosis Date   Cervical dysplasia    CIN I (cervical intraepithelial neoplasia I)    High risk HPV infection    History of abnormal cervical Pap smear    12-28-21  lgsil HPV HR +   History of adenomatous polyp of colon    Hypertension    Hypertensive retinopathy    OU   Macular degeneration, right eye    Retinal defect, 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   followed by dr [name];  pt takes  inspra for excess fluid in retina,   Sciatic nerve injury    Seasonal allergies    Vaginal dysplasia    Wears glasses    Past Surgical History:  Procedure Laterality Date   CERVICAL CONIZATION W/BX N/A 06/30/2023   Procedure: CONIZATION CERVIX WITH BIOPSY, ENDOCERVICAL CURETTAGE;  Surgeon: Carver Fila,  MD;  Location: [facility];  Service: Gynecology;  Laterality: N/A;   CO2 LASER APPLICATION N/A 06/30/2023   Procedure: CO2 LASER TO THE VAGINA;  Surgeon: Carver Fila, MD;  Location: [facility];  Service: Gynecology;  Laterality: N/A;   COLONOSCOPY  12/14/2022   HYSTEROSCOPY WITH RESECTOSCOPE  11/25/2006   @[email or web address]  by dr [name]. [name];   Resectoscopic myomectomy and polypectomy   INJECTION OF SILICONE OIL Right 09/15/2023   Procedure: INJECTION OF SILICONE OIL;  Surgeon: [name], MD;  Location[facility];  Service: Ophthalmology;  Laterality: Right;   PARS PLANA VITRECTOMY Right 09/15/2023   Procedure: PARS PLANA VITRECTOMY WITH 25 GAUGE;  Surgeon: [name], MD;  Location: [facility] OR;  Service: Ophthalmology;  Laterality: Right;   VAGINECTOMY, PARTIAL N/A 06/30/2023   Procedure: SMALL VAGINECTOMY, PARTIAL BIOSPIES;  Surgeon: Carver Fila, MD;  Location: [facility];  Service: Gynecology;  Laterality: N/A;   FAMILY HISTORY Family History  Problem Relation Age of Onset   Hypertension Mother    Diabetes Father    Diabetes Maternal Grandmother    Hypertension Maternal Grandmother    Colon cancer Neg Hx    Colon polyps Neg Hx    Esophageal cancer Neg Hx    Rectal cancer Neg Hx    Stomach cancer Neg Hx    SOCIAL HISTORY Social History   Tobacco Use   Smoking status: Never   Smokeless tobacco: Never  Vaping Use   Vaping status: Never Used  Substance Use Topics   Alcohol use: No    Alcohol/week: 0.0 standard drinks of alcohol   Drug use: Never       OPHTHALMIC EXAM: Base Eye Exam     Visual Acuity (Snellen - Linear)       Right Left   Dist cc HM 20/20 -3   Dist ph cc  NI         Tonometry (Tonopen, 2:56 PM)       Right Left   Pressure 12 16         Pupils       Pupils Dark Light Shape React   Right PERRL 5 5 Round NR   Left PERRL 4 4 Round NR         Visual Fields       Left Right    Full     Restrictions  Partial outer superior temporal, inferior temporal deficiencies         Extraocular Movement       Right Left    Full, Ortho Full, Ortho         Neuro/Psych     Oriented x3: Yes   Mood/Affect: Normal         Dilation     Both eyes: 2.5% Phenylephrine @ 2:56 PM  Slit Lamp and Fundus Exam     Slit Lamp Exam       Right Left   Lids/Lashes Dermatochalasis - upper lid Dermatochalasis - upper lid   Conjunctiva/Sclera nasal pinguecula, 1+ Injection nasally 1+ Injection, nasal Pinguecula   Cornea Nasal ptergyium extending 2.24mm onto nasal cornea, fine endo pigment, trace Punctate epithelial erosions, tear film debris 1+Punctate epithelial erosions   Anterior Chamber Shallow with closed angles, 0.5+cell/pigment deep, narrow temporal angle, 1+pigment   Iris Round and dilated, IK touch 360, +PS Round and dilated   Lens 3+ Nuclear sclerosis, 3+ Cortical cataract, +pigment deposition on anterior capsule (broken synechia) 2+ Nuclear sclerosis, 2+ Cortical cataract   Anterior Vitreous post vitrectomy, 95% silicone oil fill Mild Vitreous syneresis         Fundus Exam       Right Left   Disc mild pallor, sharp rim, +fibrosis Pink and Sharp, +cupping, shallow peripapillary SRF just nasal to disc   C/D Ratio 0.3 0.6   Macula tractional fibrosis temporal mac; Shallow SRF under oil, +macular dragging with striae Flat, good foveal reflex, Epiretinal membrane, mild RPE mottling and clumping superiorly, No heme or edema   Vessels Attenuated, mild tortuosity; fibrosis Vascular attenuation, mild Tortuousity   Periphery 360 laser, +SRF temporal periphery with +fibrosis -- dense fibrosis temporal periphery (0900) Attached, temporal retinoschisis cavity from 0300-0430 peripherally -- stable from prior -- with good early laser changes posterior and surrounding, pigmented cystoid degeneration inferiorly           Refraction     Wearing Rx       Sphere Cylinder  Axis   Right +1.25 +1.25 112   Left +2.25 +1.00 101           IMAGING AND PROCEDURES  Imaging and Procedures for @TODAY @  OCT, Retina - OU - Both Eyes       Right Eye Quality was poor. Central Foveal Thickness: 686. Progression has been stable. Findings include no IRF, subretinal hyper-reflective material, epiretinal membrane, pigment epithelial detachment, subretinal fluid (Poor image quality; diffuse SRF -- detached under oil ).   Left Eye Quality was good. Central Foveal Thickness: 303. Progression has been stable. Findings include normal foveal contour, no IRF, subretinal fluid, vitreomacular adhesion (Shallow sliver of SRF IN to disc caught on widefield -- improved, bullous schisis cavity IT periphery caught on widefield ).   Notes *Images captured and stored on drive  Diagnosis / Impression:  OD: Diffuse SRF, +intraretinal fibrosis -- detached under oil OS: shallow sliver of SRF IN to disc caught on widefield -- improved, bullous schisis cavity IT periphery caught on widefield -- not imaged today  Clinical management:  See below  Abbreviations: NFP - Normal foveal profile. CME - cystoid macular edema. PED - pigment epithelial detachment. IRF - intraretinal fluid. SRF - subretinal fluid. EZ - ellipsoid zone. ERM - epiretinal membrane. ORA - outer retinal atrophy. ORT - outer retinal tubulation. SRHM - subretinal hyper-reflective material            ASSESSMENT/PLAN:    ICD-10-CM   1. Ocular inflammation  H57.89     2. 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 Traction detachment of right retina  H33.41     4. 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 Exudative age-related macular degeneration of right eye with active choroidal neovascularization (HCC)  H35.3211  6. 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 Essential hypertension  I10     8. Hypertensive retinopathy of both eyes  H35.033      9. Combined forms of age-related cataract of both eyes  H25.813      1-3. Ocular inflammation and PVR retinal detachment OD - 10.16.24 pt started seeing new floaters and "sparks" in vision - pt developed progressively worsening vision and vit haze - VA OD decreased to LP despite aggressive anti-inflammatory medical therapy - pt was on PredForte q1h OD, Prolensa QID OD, and 40 mg po Prednisone daily - b-scan 12.04.24 shows significant mobile vit opacities, ?RD - s/p PPV/PFO/EL/FAX/SO OD, 12.05.2024  - undiluted vit samples obtained intra-op for gram stain and culture -- no organisms, but +WBCs - intra-op - clearance of vitreous haze showed extensive fibrosis and PVR tractional detachment -- silicon oil added at end of case - IOP OD 12 -- continue brimonidine and Cosopt TID OD - AC shallow with angle closed -- will benefit from an inferior peripheral iridotomy, but AC too shallow to complete - exam shows residual intraretinal fibrosis and persistent SRF             - cont   PF q2h OD -- decrease to QID                         PSO ung BID OD   - cont  brimondidine and Cosopt BID OD -- okay to stop Cosopt             - avoid laying flat on back             - post op drop and positioning instructions reviewed              - tylenol/ibuprofen for pain  - discussed potential repeat PPV to remove scar tissue, repair residual RD and create a peripheral iridotomy -- will hold off on surgery for now --  - recommend possible cataract sx first to improve posterior view - f/u 3-4 weeks -- POV, DFE, IOP check - pt went back to work on October 17, 2023 -- doing well  4,5. CSCR OD -- chronic  - pt reports decreased vision OD since December 2019  - reports significant stressors in life -- work  - denies steroid use - FA (09.30.20) shows focal hyperfluorescent leakage points nasal macula OD -- expansile dot phenotype? - started 50 mg po eplerenone since 09.30.20  -- pt self d/c in February, re-started  in June 2021 - s/p IVA OD #1 (09.01.21), #2 (09.29.21), #3 (11.03.21), #4 (12.03.21) -- IVA resistance - s/p IVE OD #1 (01.20.22), #2 (02.18.22), #3 (04.05.22-sample), #4 (05.23.22), # 5 (06.27.22), #6 (07.28.22), #7 (08.26.22), #8 (09.23.22), #9 (10.21.22), #10 (11.18.22), #11 (12.16.22), #12 (01.20.23), #13 (03.02.23), #14 (04.07.23), #15 (05.12.23), #16 (06.09.23), #17 (07.14.23), #18 (08.08.23), #19 (09.08.23), #20 (10.06.23), #21 (11.06.23), #22 (12.04.23), #23 (01.08.24), #24 (02.12.24), #25 (03.18.24), #26 (04.22.24), #27 (05.20.24) -- IVE resistance ======================================================== - s/p IVV OD #1 (07.02.24), #2 (07.30.24), #3 (08.27.24), #4 (09.26.24), #5 (10.24.24) - OCT -- no images obtained today  - differential includes atypical exudative ARMD - IVV informed consent obtained and signed, 07.02.24  - stop po eplerenone 50 mg daily for now  - monitor for now  6. Retinoschisis OU (OD > OS)  - bullous retinoschisis cavity IT quadrant OD-- 2956-2130 oclock  - bullous retinoschisis cavity IT quadrant OS -- 8657-8469 oclock  -  no associated retinal holes/RT/RD on scleral depression   - widefield Optos imaging (04.22.24) shows mild interval progression of schisis cavities' size and posterior extension in comparison to baseline images  - s/p laser retinopexy OS 08.12.24 -- good laser changes surrounding - s/p laser retinopexy OD (10.07.24) -- good early laser changes surrounding - new schisis cavity found on examination OD (10.28.24) - 1030 periphery -- no RT/RD on scleral depressed exam - s/p laser retinopexy OD to new schisis at 1030 on 10.28.24 - monitor  7,8. Hypertensive retinopathy OU  - discussed importance of tight BP control  - monitor  9. Mixed form age related cataracts OU - The symptoms of cataract, surgical options, and treatments and risks were discussed with patient.  - discussed diagnosis and progression, specifically post-vit progression  -  discussed case with Dr. Dione Booze -- will refer back to him for possible cataract surgery OD   Ophthalmic Meds Ordered this visit:  Meds ordered this encounter  Medications   brimonidine (ALPHAGAN) 0.2 % ophthalmic solution    Sig: Place 1 drop into the right eye 2 (two) times daily.    Dispense:  15 mL    Refill:  1     Return for f/u 3-4 weeks, RD OD, DFE, OCT.  There are no Patient Instructions on file for this visit.  This document serves as a record of services personally performed by Karie Chimera, MD, PhD. It was created on their behalf by Glee Arvin. Manson Passey, OA an ophthalmic technician. The creation of this record is the provider's dictation and/or activities during the visit.    Electronically signed by: Glee Arvin. Manson Passey, OA 12/08/23 8:14 AM   Karie Chimera, M.D., Ph.D. Diseases & Surgery of the Retina and Vitreous [facility]  I have reviewed the above documentation for accuracy and completeness, and I agree with the above. Karie Chimera, M.D., Ph.D. 12/08/23 8:16 AM   Abbreviations: M myopia (nearsighted); A astigmatism; H hyperopia (farsighted); P presbyopia; Mrx spectacle prescription;  CTL contact lenses; OD right eye; OS left eye; OU both eyes  XT exotropia; ET esotropia; PEK punctate epithelial keratitis; PEE punctate epithelial erosions; DES dry eye syndrome; MGD meibomian gland dysfunction; ATs artificial tears; PFAT's preservative free artificial tears; NSC nuclear sclerotic cataract; PSC posterior subcapsular cataract; ERM epi-retinal membrane; PVD posterior vitreous detachment; RD retinal detachment; DM diabetes mellitus; DR diabetic retinopathy; NPDR non-proliferative diabetic retinopathy; PDR proliferative diabetic retinopathy; CSME clinically significant macular edema; DME diabetic macular edema; dbh dot blot hemorrhages; CWS cotton wool spot; POAG primary open angle glaucoma; C/D cup-to-disc ratio; HVF humphrey visual field; GVF goldmann  visual field; OCT optical coherence tomography; IOP intraocular pressure; BRVO Branch retinal vein occlusion; CRVO central retinal vein occlusion; CRAO central retinal artery occlusion; BRAO branch retinal artery occlusion; RT retinal tear; SB scleral buckle; PPV pars plana vitrectomy; VH Vitreous hemorrhage; PRP panretinal laser photocoagulation; IVK intravitreal kenalog; VMT vitreomacular traction; MH Macular hole;  NVD neovascularization of the disc; NVE neovascularization elsewhere; AREDS age related eye disease study; ARMD age related macular degeneration; POAG primary open angle glaucoma; EBMD epithelial/anterior basement membrane dystrophy; ACIOL anterior chamber intraocular lens; IOL intraocular lens; PCIOL posterior chamber intraocular lens; Phaco/IOL phacoemulsification with intraocular lens placement; PRK photorefractive keratectomy; LASIK laser assisted in situ keratomileusis; HTN hypertension; DM diabetes mellitus; COPD chronic obstructive pulmonary disease

## 2023-12-07 ENCOUNTER — Ambulatory Visit (INDEPENDENT_AMBULATORY_CARE_PROVIDER_SITE_OTHER): Payer: Commercial Managed Care - PPO | Admitting: Ophthalmology

## 2023-12-07 ENCOUNTER — Encounter (INDEPENDENT_AMBULATORY_CARE_PROVIDER_SITE_OTHER): Payer: Self-pay | Admitting: Ophthalmology

## 2023-12-07 DIAGNOSIS — H25813 Combined forms of age-related cataract, bilateral: Secondary | ICD-10-CM

## 2023-12-07 DIAGNOSIS — H35711 Central serous chorioretinopathy, right eye: Secondary | ICD-10-CM | POA: Diagnosis not present

## 2023-12-07 DIAGNOSIS — H353211 Exudative age-related macular degeneration, right eye, with active choroidal neovascularization: Secondary | ICD-10-CM

## 2023-12-07 DIAGNOSIS — I10 Essential (primary) hypertension: Secondary | ICD-10-CM

## 2023-12-07 DIAGNOSIS — H35033 Hypertensive retinopathy, bilateral: Secondary | ICD-10-CM

## 2023-12-07 DIAGNOSIS — H33103 Unspecified retinoschisis, bilateral: Secondary | ICD-10-CM

## 2023-12-07 DIAGNOSIS — H3341 Traction detachment of retina, right eye: Secondary | ICD-10-CM

## 2023-12-07 DIAGNOSIS — H5789 Other specified disorders of eye and adnexa: Secondary | ICD-10-CM

## 2023-12-07 DIAGNOSIS — H3521 Other non-diabetic proliferative retinopathy, right eye: Secondary | ICD-10-CM | POA: Diagnosis not present

## 2023-12-07 MED ORDER — BRIMONIDINE TARTRATE 0.2 % OP SOLN: 1.0000 [drp] | Freq: Two times a day (BID) | OPHTHALMIC | 1 refills | Status: DC

## 2023-12-08 ENCOUNTER — Encounter (INDEPENDENT_AMBULATORY_CARE_PROVIDER_SITE_OTHER): Payer: Self-pay | Admitting: Ophthalmology

## 2023-12-08 ENCOUNTER — Encounter (INDEPENDENT_AMBULATORY_CARE_PROVIDER_SITE_OTHER): Payer: Commercial Managed Care - PPO | Admitting: Ophthalmology

## 2023-12-15 NOTE — Progress Notes (Signed)
 [facility] - Clinic Note  12/28/2023     CHIEF COMPLAINT Patient presents for Retina Follow Up   HISTORY OF PRESENT ILLNESS: [name] is a 53 y.o. female who presents to the clinic today for:  HPI     Retina Follow Up   Patient presents with  Other.  In right eye.  This started 3 weeks ago.  I, the attending physician,  performed the HPI with the patient and updated documentation appropriately.        Comments   Patient here for 3 weeks retina follow up for ocular inflammation OD. Patient states vision doing ok. About the same. No eye pain.       Last edited by [name], MD on 12/31/2023 12:34 AM.     Pt has cataract sx scheduled on April 4th with Dr. Dione Booze, pt states her vision is the same, her eyes are very itchy right now   Referring physician: [name], PA-C [address] [place],  [place] [address]  HISTORICAL INFORMATION:   Selected notes from the MEDICAL RECORD NUMBER Referred by Dr. [name] for concern of sub-macular fluid / retinoschisis   CURRENT MEDICATIONS: Current Outpatient Medications (Ophthalmic Drugs)  Medication Sig   bacitracin-polymyxin b (POLYSPORIN) ophthalmic ointment Place into the right eye 2 (two) times daily. Place a 1/2 inch ribbon of ointment into the lower eyelid.   brimonidine (ALPHAGAN) 0.2 % ophthalmic solution Place 1 drop into the right eye 2 (two) times daily.   prednisoLONE acetate (PRED FORTE) 1 % ophthalmic suspension Place 1 drop into the right eye 4 (four) times daily.   prednisoLONE acetate (PRED FORTE) 1 % ophthalmic suspension Place 1 drop into the right eye every 2 (two) hours.   Bromfenac Sodium 0.09 % SOLN Apply 1 drop to eye in the morning, at noon, in the evening, and at bedtime. 1 drop in the right eye four times a day (Patient not taking: Reported on 11/01/2023)   No current facility-administered medications for this visit. (Ophthalmic Drugs)    Current Outpatient Medications (Other)  Medication Sig   diphenhydrAMINE HCl (BENADRYL ALLERGY PO) Take 25 mg by mouth daily as needed (allergies).   eplerenone (INSPRA) 50 MG tablet Take 50 mg by mouth at bedtime.   hydrochlorothiazide (HYDRODIURIL) 25 MG tablet Take 25 mg by mouth daily.   lisinopril (PRINIVIL,ZESTRIL) 20 MG tablet Take 1 tablet (20 mg total) by mouth daily.   progesterone (PROMETRIUM) 100 MG capsule Take 1 capsule (100 mg total) by mouth at bedtime.   predniSONE (DELTASONE) 20 MG tablet Take 2 tablets (40 mg total) by mouth daily with breakfast. (Patient not taking: Reported on 11/01/2023)   No current facility-administered medications for this visit. (Other)   REVIEW OF SYSTEMS: ROS   Positive for: Cardiovascular, Eyes Negative for: Constitutional, Gastrointestinal, Neurological, Skin, Genitourinary, Musculoskeletal, HENT, Endocrine, Respiratory, Psychiatric, Allergic/Imm, Heme/Lymph Last edited by Laddie Aquas, COA on 12/28/2023 12:52 PM.     ALLERGIES No Known Allergies  PAST MEDICAL HISTORY Past Medical History:  Diagnosis Date   Cervical dysplasia    CIN I (cervical intraepithelial neoplasia I)    High risk HPV infection    History of abnormal cervical Pap smear    12-28-21 lgsil HPV HR +   History of adenomatous polyp of colon    Hypertension    Hypertensive retinopathy    OU   Macular degeneration, right eye    Retinal defect,  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   followed by dr [name];  pt takes  inspra for excess fluid in retina,   Sciatic nerve injury    Seasonal allergies    Vaginal dysplasia    Wears glasses    Past Surgical History:  Procedure Laterality Date   CERVICAL CONIZATION W/BX N/A 06/30/2023   Procedure: CONIZATION CERVIX WITH BIOPSY, ENDOCERVICAL CURETTAGE;  Surgeon: Carver Fila, MD;  Location: [facility];  Service: Gynecology;  Laterality: N/A;   CO2 LASER APPLICATION N/A 06/30/2023   Procedure: CO2 LASER TO THE  VAGINA;  Surgeon: Carver Fila, MD;  Location: [facility];  Service: Gynecology;  Laterality: N/A;   COLONOSCOPY  12/14/2022   HYSTEROSCOPY WITH RESECTOSCOPE  11/25/2006   @[facility]  by dr [name]. [name];   Resectoscopic myomectomy and polypectomy   INJECTION OF SILICONE OIL Right 09/15/2023   Procedure: INJECTION OF SILICONE OIL;  Surgeon: [name], MD;  Location: [facility] OR;  Service: Ophthalmology;  Laterality: Right;   PARS PLANA VITRECTOMY Right 09/15/2023   Procedure: PARS PLANA VITRECTOMY WITH 25 GAUGE;  Surgeon: [name], MD;  Location: [facility] OR;  Service: Ophthalmology;  Laterality: Right;   VAGINECTOMY, PARTIAL N/A 06/30/2023   Procedure: SMALL VAGINECTOMY, PARTIAL BIOSPIES;  Surgeon: Carver Fila, MD;  Location: [facility];  Service: Gynecology;  Laterality: N/A;   FAMILY HISTORY Family History  Problem Relation Age of Onset   Hypertension Mother    Diabetes Father    Diabetes Maternal Grandmother    Hypertension Maternal Grandmother    Colon cancer Neg Hx    Colon polyps Neg Hx    Esophageal cancer Neg Hx    Rectal cancer Neg Hx    Stomach cancer Neg Hx    SOCIAL HISTORY Social History   Tobacco Use   Smoking status: Never   Smokeless tobacco: Never  Vaping Use   Vaping status: Never Used  Substance Use Topics   Alcohol use: No    Alcohol/week: 0.0 standard drinks of alcohol   Drug use: Never       OPHTHALMIC EXAM: Base Eye Exam     Visual Acuity (Snellen - Linear)       Right Left   Dist cc CF at 3'    Dist ph cc NI 20/20 -2    Correction: Glasses         Tonometry (Tonopen, 12:47 PM)       Right Left   Pressure 19 12         Pupils       Dark Light Shape React APD   Right 5 5 Round NR None   Left 4 4 Round NR None         Visual Fields (Counting fingers)       Left Right    Full    Restrictions  Total inferior nasal deficiency; Partial inner superior temporal, inferior temporal, superior  nasal deficiencies         Extraocular Movement       Right Left    Full, Ortho Full, Ortho         Neuro/Psych     Oriented x3: Yes   Mood/Affect: Normal         Dilation     Both eyes: 1.0% Mydriacyl, 2.5% Phenylephrine @ 12:47 PM           Slit Lamp and Fundus Exam     Slit Lamp Exam  Right Left   Lids/Lashes Dermatochalasis - upper lid Dermatochalasis - upper lid   Conjunctiva/Sclera nasal pinguecula, 1+ Injection nasally 1+ Injection, nasal Pinguecula   Cornea Nasal ptergyium extending 2.68mm onto nasal cornea, fine endo pigment, trace Punctate epithelial erosions, tear film debris 1+Punctate epithelial erosions   Anterior Chamber Shallow with closed angles, 0.5+cell/pigment deep, narrow temporal angle, 1+pigment   Iris Round and dilated, IK touch 360, +PS Round and dilated   Lens 3+ Nuclear sclerosis, 3+ Cortical cataract, +pigment deposition on anterior capsule (broken synechia) 2+ Nuclear sclerosis, 2+ Cortical cataract   Anterior Vitreous post vitrectomy, 95% silicone oil fill Mild Vitreous syneresis         Fundus Exam       Right Left   Disc mild pallor, sharp rim, +fibrosis Pink and Sharp, +cupping, shallow peripapillary SRF just nasal to disc   C/D Ratio 0.3 0.6   Macula tractional fibrosis temporal mac; Shallow SRF under oil, +macular dragging with striae Flat, good foveal reflex, Epiretinal membrane, mild RPE mottling and clumping superiorly, No heme or edema   Vessels Attenuated, mild tortuosity; fibrosis Vascular attenuation, mild Tortuousity   Periphery 360 laser, +SRF temporal periphery with +fibrosis -- dense fibrosis temporal periphery (0900) Attached, temporal retinoschisis cavity from 0300-0430 peripherally -- stable from prior -- with good early laser changes posterior and surrounding, pigmented cystoid degeneration inferiorly           IMAGING AND PROCEDURES  Imaging and Procedures for @TODAY @  OCT, Retina - OU - Both Eyes        Right Eye Quality was good. Central Foveal Thickness: 416. Progression has been stable. Findings include no IRF, abnormal foveal contour, subretinal hyper-reflective material, epiretinal membrane, pigment epithelial detachment, subretinal fluid, outer retinal atrophy (persistent SRF inferior and temporal quads -- detached under oil ).   Left Eye Quality was good. Central Foveal Thickness: 296. Progression has been stable. Findings include normal foveal contour, no IRF, subretinal fluid, vitreomacular adhesion (Shallow sliver of SRF IN to disc caught on widefield -- improved, bullous schisis cavity IT periphery caught on widefield ).   Notes *Images captured and stored on drive  Diagnosis / Impression:  OD: persistent SRF inferior and temporal quads -- detached under oil  OS: shallow sliver of SRF IN to disc caught on widefield -- improved, bullous schisis cavity IT periphery caught on widefield -- not imaged today  Clinical management:  See below  Abbreviations: NFP - Normal foveal profile. CME - cystoid macular edema. PED - pigment epithelial detachment. IRF - intraretinal fluid. SRF - subretinal fluid. EZ - ellipsoid zone. ERM - epiretinal membrane. ORA - outer retinal atrophy. ORT - outer retinal tubulation. SRHM - subretinal hyper-reflective material            ASSESSMENT/PLAN:    ICD-10-CM   1. Ocular inflammation  H57.89     2. 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 Traction detachment of right retina  H33.41 OCT, Retina - OU - Both Eyes    4. 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 Essential hypertension  I10     8. Hypertensive retinopathy of both eyes  H35.033     9. Combined forms of  age-related  cataract of both eyes  H25.813      1-3. Ocular inflammation and PVR retinal detachment OD - 10.16.24 pt started seeing new floaters and "sparks" in vision - pt developed progressively worsening vision and vit haze - VA OD decreased to LP despite aggressive anti-inflammatory medical therapy - pt was on PredForte q1h OD, Prolensa QID OD, and 40 mg po Prednisone daily - b-scan 12.04.24 shows significant mobile vit opacities, ?RD - s/p PPV/PFO/EL/FAX/SO OD, 12.05.2024  - undiluted vit samples obtained intra-op for gram stain and culture -- no organisms, but +WBCs - intra-op - clearance of vitreous haze showed extensive fibrosis and PVR tractional detachment -- silicon oil added at end of case - IOP OD 12 -- continue brimonidine and Cosopt TID OD - AC shallow with angle closed -- will benefit from an inferior peripheral iridotomy, but AC too shallow to complete - exam shows residual intraretinal fibrosis and persistent SRF             - cont   PF QID OD                          PSO ung BID OD   - cont  brimondidine OD - discussed potential repeat PPV to remove scar tissue, repair residual RD and create a peripheral iridotomy -- will hold off on surgery for now   - recommend possible cataract sx first to improve posterior view -- scheduled for cataract sx with Dr. Dione Booze on April 4 - f/u April 25th or later -- POV, DFE  4,5. CSCR OD -- chronic  - pt reports decreased vision OD since December 2019  - reports significant stressors in life -- work  - denies steroid use - FA (09.30.20) shows focal hyperfluorescent leakage points nasal macula OD -- expansile dot phenotype? - started 50 mg po eplerenone since 09.30.20  -- pt self d/c in February, re-started in June 2021 - s/p IVA OD #1 (09.01.21), #2 (09.29.21), #3 (11.03.21), #4 (12.03.21) -- IVA resistance - s/p IVE OD #1 (01.20.22), #2 (02.18.22), #3 (04.05.22-sample), #4 (05.23.22), # 5 (06.27.22), #6 (07.28.22), #7 (08.26.22), #8 (09.23.22), #9  (10.21.22), #10 (11.18.22), #11 (12.16.22), #12 (01.20.23), #13 (03.02.23), #14 (04.07.23), #15 (05.12.23), #16 (06.09.23), #17 (07.14.23), #18 (08.08.23), #19 (09.08.23), #20 (10.06.23), #21 (11.06.23), #22 (12.04.23), #23 (01.08.24), #24 (02.12.24), #25 (03.18.24), #26 (04.22.24), #27 (05.20.24) -- IVE resistance ======================================================== - s/p IVV OD #1 (07.02.24), #2 (07.30.24), #3 (08.27.24), #4 (09.26.24), #5 (10.24.24)  - differential includes atypical exudative ARMD - IVV informed consent obtained and signed, 07.02.24  - stop po eplerenone 50 mg daily for now  - monitor for now  6. Retinoschisis OU (OD > OS)  - bullous retinoschisis cavity IT quadrant OD-- 8119-1478 oclock  - bullous retinoschisis cavity IT quadrant OS -- 2956-2130 oclock  - no associated retinal holes/RT/RD on scleral depression   - widefield Opt[employer] imaging (04.22.24) shows mild interval progression of schisis cavities' size and posterior extension in comparison to baseline images  - s/p laser retinopexy OS 08.12.24 -- good laser changes surrounding - s/p laser retinopexy OD (10.07.24) -- good early laser changes surrounding - new schisis cavity found on examination OD (10.28.24) - 1030 periphery -- no RT/RD on scleral depressed exam - s/p laser retinopexy OD to new schisis at 1030 on 10.28.24 - monitor  7,8. Hypertensive retinopathy OU  - discussed importance of tight BP control  - monitor  9. Mixed form age related cataracts  OU - The symptoms of cataract, surgical options, and treatments and risks were discussed with patient.  - discussed diagnosis and progression, specifically post-vit progression  - surgery schedule for April 4th with Dr. Dione Booze  Ophthalmic Meds Ordered this visit:  No orders of the defined types were placed in this encounter.    Return for f/u April 24th or later, RD OD, DFE, OCT.  There are no Patient Instructions on file for this visit.  This document  serves as a record of services personally performed by Karie Chimera, MD, PhD. It was created on their behalf by Glee Arvin. Manson Passey, OA an ophthalmic technician. The creation of this record is the provider's dictation and/or activities during the visit.    Electronically signed by: Glee Arvin. Manson Passey, OA 12/31/23 12:55 AM  Karie Chimera, M.D., Ph.D. Diseases & Surgery of the Retina and Vitreous [facility]  I have reviewed the above documentation for accuracy and completeness, and I agree with the above. Karie Chimera, M.D., Ph.D. 12/31/23 12:58 AM    Abbreviations: M myopia (nearsighted); A astigmatism; H hyperopia (farsighted); P presbyopia; Mrx spectacle prescription;  CTL contact lenses; OD right eye; OS left eye; OU both eyes  XT exotropia; ET esotropia; PEK punctate epithelial keratitis; PEE punctate epithelial erosions; DES dry eye syndrome; MGD meibomian gland dysfunction; ATs artificial tears; PFAT's preservative free artificial tears; NSC nuclear sclerotic cataract; PSC posterior subcapsular cataract; ERM epi-retinal membrane; PVD posterior vitreous detachment; RD retinal detachment; DM diabetes mellitus; DR diabetic retinopathy; NPDR non-proliferative diabetic retinopathy; PDR proliferative diabetic retinopathy; CSME clinically significant macular edema; DME diabetic macular edema; dbh dot blot hemorrhages; CWS cotton wool spot; POAG primary open angle glaucoma; C/D cup-to-disc ratio; HVF humphrey visual field; GVF goldmann visual field; OCT optical coherence tomography; IOP intraocular pressure; BRVO Branch retinal vein occlusion; CRVO central retinal vein occlusion; CRAO central retinal artery occlusion; BRAO branch retinal artery occlusion; RT retinal tear; SB scleral buckle; PPV pars plana vitrectomy; VH Vitreous hemorrhage; PRP panretinal laser photocoagulation; IVK intravitreal kenalog; VMT vitreomacular traction; MH Macular hole;  NVD neovascularization of the  disc; NVE neovascularization elsewhere; AREDS age related eye disease study; ARMD age related macular degeneration; POAG primary open angle glaucoma; EBMD epithelial/anterior basement membrane dystrophy; ACIOL anterior chamber intraocular lens; IOL intraocular lens; PCIOL posterior chamber intraocular lens; Phaco/IOL phacoemulsification with intraocular lens placement; PRK photorefractive keratectomy; LASIK laser assisted in situ keratomileusis; HTN hypertension; DM diabetes mellitus; COPD chronic obstructive pulmonary disease

## 2023-12-21 ENCOUNTER — Other Ambulatory Visit: Payer: Self-pay | Admitting: Obstetrics and Gynecology

## 2023-12-21 DIAGNOSIS — Z1231 Encounter for screening mammogram for malignant neoplasm of breast: Secondary | ICD-10-CM

## 2023-12-28 ENCOUNTER — Encounter (INDEPENDENT_AMBULATORY_CARE_PROVIDER_SITE_OTHER): Payer: Self-pay | Admitting: Ophthalmology

## 2023-12-28 ENCOUNTER — Ambulatory Visit (INDEPENDENT_AMBULATORY_CARE_PROVIDER_SITE_OTHER): Payer: Commercial Managed Care - PPO | Admitting: Ophthalmology

## 2023-12-28 DIAGNOSIS — H35711 Central serous chorioretinopathy, right eye: Secondary | ICD-10-CM | POA: Diagnosis not present

## 2023-12-28 DIAGNOSIS — H5789 Other specified disorders of eye and adnexa: Secondary | ICD-10-CM | POA: Diagnosis not present

## 2023-12-28 DIAGNOSIS — H25813 Combined forms of age-related cataract, bilateral: Secondary | ICD-10-CM

## 2023-12-28 DIAGNOSIS — H33103 Unspecified retinoschisis, bilateral: Secondary | ICD-10-CM | POA: Diagnosis not present

## 2023-12-28 DIAGNOSIS — H3521 Other non-diabetic proliferative retinopathy, right eye: Secondary | ICD-10-CM | POA: Diagnosis not present

## 2023-12-28 DIAGNOSIS — H353211 Exudative age-related macular degeneration, right eye, with active choroidal neovascularization: Secondary | ICD-10-CM | POA: Diagnosis not present

## 2023-12-28 DIAGNOSIS — H35033 Hypertensive retinopathy, bilateral: Secondary | ICD-10-CM

## 2023-12-28 DIAGNOSIS — I10 Essential (primary) hypertension: Secondary | ICD-10-CM

## 2023-12-28 DIAGNOSIS — H3341 Traction detachment of retina, right eye: Secondary | ICD-10-CM

## 2023-12-31 ENCOUNTER — Encounter (INDEPENDENT_AMBULATORY_CARE_PROVIDER_SITE_OTHER): Payer: Self-pay | Admitting: Ophthalmology

## 2024-02-02 NOTE — Progress Notes (Signed)
 [facility] - Clinic Note  02/03/2024     CHIEF COMPLAINT Patient presents for Retina Follow Up   HISTORY OF PRESENT ILLNESS: [name] is a 53 y.o. female who presents to the clinic today for:  HPI     Retina Follow Up   Patient presents with  Retinal Break/Detachment.  In right eye.  This started 5 weeks ago.  Duration of 5 weeks.  Since onset it is gradually worsening.  I, the attending physician,  performed the HPI with the patient and updated documentation appropriately.        Comments   Retina follow up RD OD pt is reporting since having cataract surgery on April 4th with Dr Candi Chafe her side vision has worsen and she is not seeing like she could before she denies any flashes or floaters she is having some pain in OD since her surgery a pressure feeling that comes and goes       Last edited by Ronelle Coffee, MD on 02/05/2024  2:40 AM.    Pt had cataract sx OD with Dr. Candi Chafe on April 4th, she states her eye feels the same, but she feels like her temporal peripheral vision is worse than before the sx, she states Dr. Candi Chafe said that her eye is "quite compromised", he decreased the PF to QID OD, pt has a follow up on May 28th, but states if Dr, Karyl Paget operates before then, she does not need to go to that appt, pt states her eye hurts, she feels like there is pressure on it  Referring physician: Gabino Joe, PA-C [address] [place],  [place] [address]  HISTORICAL INFORMATION:   Selected notes from the MEDICAL RECORD NUMBER Referred by Dr. Cleone Dad for concern of sub-macular fluid / retinoschisis   CURRENT MEDICATIONS: Current Outpatient Medications (Ophthalmic Drugs)  Medication Sig   bacitracin -polymyxin b  (POLYSPORIN ) ophthalmic ointment Place into the right eye 2 (two) times daily. Place a 1/2 inch ribbon of ointment into the lower eyelid.   brimonidine  (ALPHAGAN ) 0.2 % ophthalmic solution Place 1 drop into the  right eye 2 (two) times daily.   Bromfenac  Sodium 0.09 % SOLN Apply 1 drop to eye in the morning, at noon, in the evening, and at bedtime. 1 drop in the right eye four times a day (Patient not taking: Reported on 11/01/2023)   prednisoLONE  acetate (PRED FORTE ) 1 % ophthalmic suspension Place 1 drop into the right eye 4 (four) times daily.   prednisoLONE  acetate (PRED FORTE ) 1 % ophthalmic suspension Place 1 drop into the right eye every 2 (two) hours.   No current facility-administered medications for this visit. (Ophthalmic Drugs)   Current Outpatient Medications (Other)  Medication Sig   diphenhydrAMINE HCl (BENADRYL ALLERGY PO) Take 25 mg by mouth daily as needed (allergies).   eplerenone  (INSPRA ) 50 MG tablet Take 50 mg by mouth at bedtime.   hydrochlorothiazide (HYDRODIURIL) 25 MG tablet Take 25 mg by mouth daily.   lisinopril  (PRINIVIL ,ZESTRIL ) 20 MG tablet Take 1 tablet (20 mg total) by mouth daily.   predniSONE  (DELTASONE ) 20 MG tablet Take 2 tablets (40 mg total) by mouth daily with breakfast. (Patient not taking: Reported on 11/01/2023)   progesterone  (PROMETRIUM ) 100 MG capsule Take 1 capsule (100 mg total) by mouth at bedtime.   No current facility-administered medications for this visit. (Other)   REVIEW OF SYSTEMS: ROS   Positive for: Cardiovascular, Eyes Negative for: Constitutional, Gastrointestinal, Neurological,  Skin, Genitourinary, Musculoskeletal, HENT, Endocrine, Respiratory, Psychiatric, Allergic/Imm, Heme/Lymph Last edited by Alise Appl, COT on 02/03/2024  9:41 AM.     ALLERGIES No Known Allergies  PAST MEDICAL HISTORY Past Medical History:  Diagnosis Date   Cervical dysplasia    CIN I (cervical intraepithelial neoplasia I)    High risk HPV infection    History of abnormal cervical Pap smear    12-28-21 lgsil HPV HR +   History of adenomatous polyp of colon    Hypertension    Hypertensive retinopathy    OU   Macular degeneration, right eye     Retinal defect, 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   followed by dr Plummer Matich;  pt takes  inspra  for excess fluid in retina,   Sciatic nerve injury    Seasonal allergies    Vaginal dysplasia    Wears glasses    Past Surgical History:  Procedure Laterality Date   CERVICAL CONIZATION W/BX N/A 06/30/2023   Procedure: CONIZATION CERVIX WITH BIOPSY, ENDOCERVICAL CURETTAGE;  Surgeon: Suzi Essex, MD;  Location: [facility];  Service: Gynecology;  Laterality: N/A;   CO2 LASER APPLICATION N/A 06/30/2023   Procedure: CO2 LASER TO THE VAGINA;  Surgeon: Suzi Essex, MD;  Location: [facility];  Service: Gynecology;  Laterality: N/A;   COLONOSCOPY  12/14/2022   HYSTEROSCOPY WITH RESECTOSCOPE  11/25/2006   @[facility]  by dr Kirk Peper. [name];   Resectoscopic myomectomy and polypectomy   INJECTION OF SILICONE OIL Right 09/15/2023   Procedure: INJECTION OF SILICONE OIL;  Surgeon: Ronelle Coffee, MD;  Location[facility] OR;  Service: Ophthalmology;  Laterality: Right;   PARS PLANA VITRECTOMY Right 09/15/2023   Procedure: PARS PLANA VITRECTOMY WITH 25 GAUGE;  Surgeon: Ronelle Coffee, MD;  Location: [facility] OR;  Service: Ophthalmology;  Laterality: Right;   VAGINECTOMY, PARTIAL N/A 06/30/2023   Procedure: SMALL VAGINECTOMY, PARTIAL BIOSPIES;  Surgeon: Suzi Essex, MD;  Location: [facility];  Service: Gynecology;  Laterality: N/A;   FAMILY HISTORY Family History  Problem Relation Age of Onset   Hypertension Mother    Diabetes Father    Diabetes Maternal Grandmother    Hypertension Maternal Grandmother    Colon cancer Neg Hx    Colon polyps Neg Hx    Esophageal cancer Neg Hx    Rectal cancer Neg Hx    Stomach cancer Neg Hx    SOCIAL HISTORY Social History   Tobacco Use   Smoking status: Never   Smokeless tobacco: Never  Vaping Use   Vaping status: Never Used  Substance Use Topics   Alcohol use: No    Alcohol/week: 0.0 standard drinks of alcohol   Drug use: Never        OPHTHALMIC EXAM: Base Eye Exam     Visual Acuity (Snellen - Linear)       Right Left   Dist [place] HM 20/25         Tonometry (Tonopen, 9:46 AM)       Right Left   Pressure 9 14         Pupils       Pupils Dark Light Shape React   Right PERRL 4 4 Round NR   Left PERRL 4 4 Round NR         Visual Fields       Left Right    Full    Restrictions  Total superior nasal deficiency         Extraocular Movement  Right Left    Full, Ortho Full, Ortho         Neuro/Psych     Oriented x3: Yes   Mood/Affect: Normal         Dilation     Both eyes: 2.5% Phenylephrine  @ 9:45 AM           Slit Lamp and Fundus Exam     Slit Lamp Exam       Right Left   Lids/Lashes Dermatochalasis - upper lid Dermatochalasis - upper lid   Conjunctiva/Sclera nasal pinguecula, trace Injection nasally 1+ Injection, nasal Pinguecula   Cornea Nasal ptergyium extending 2.77mm onto nasal cornea, peripheral haze, endo pigment, 1+ Guttata, IK touch resolved, well healed cataract wound 1+Punctate epithelial erosions   Anterior Chamber Deep, 30 % silicone oil bubble deep, narrow temporal angle, 1+pigment   Iris Round and dilated, IK touch 360 -- resolved, +PS greatest temporally Round and dilated   Lens 3 piece sulcus IOL with mild nasal displacement and rupture PC 2+ Nuclear sclerosis, 2+ Cortical cataract   Anterior Vitreous post vitrectomy, 95% silicone oil fill, lens remnant settled inferiorly Mild Vitreous syneresis         Fundus Exam       Right Left   Disc mild pallor, sharp rim, +fibrosis Pink and Sharp, +cupping, shallow peripapillary SRF just nasal to disc   C/D Ratio 0.4 0.6   Macula tractional fibrosis temporal mac; Shallow SRF under oil, +macular dragging with striae Flat, good foveal reflex, Epiretinal membrane, mild RPE mottling and clumping superiorly, No heme or edema   Vessels Attenuated, mild tortuosity; fibrosis Vascular attenuation, mild Tortuousity    Periphery 360 laser, dense fibrosis temporal periphery (0900) Attached, temporal retinoschisis cavity from 0300-0430 peripherally -- stable from prior -- with good early laser changes posterior and surrounding, pigmented cystoid degeneration inferiorly           Refraction     Wearing Rx       Sphere Cylinder Axis   Right +1.25 +1.25 112   Left +2.25 +1.00 101           IMAGING AND PROCEDURES  Imaging and Procedures for @TODAY @  OCT, Retina - OU - Both Eyes       Right Eye Quality was poor. Progression has been stable. Findings include no IRF, abnormal foveal contour, subretinal hyper-reflective material, epiretinal membrane, pigment epithelial detachment, subretinal fluid, outer retinal atrophy (Very poor image, superior macula attached, poor visualization of inferior macula).   Left Eye Quality was good. Central Foveal Thickness: 300. Progression has been stable. Findings include normal foveal contour, no IRF, subretinal fluid, vitreomacular adhesion (Shallow sliver of SRF IN to disc caught on widefield -- improved, bullous schisis cavity IT periphery caught on widefield ).   Notes *Images captured and stored on drive  Diagnosis / Impression:  OD: Very poor image, superior macula attached, poor visualization of inferior macula OS: shallow sliver of SRF IN to disc caught on widefield -- improved, bullous schisis cavity IT periphery caught on widefield -- not imaged today  Clinical management:  See below  Abbreviations: NFP - Normal foveal profile. CME - cystoid macular edema. PED - pigment epithelial detachment. IRF - intraretinal fluid. SRF - subretinal fluid. EZ - ellipsoid zone. ERM - epiretinal membrane. ORA - outer retinal atrophy. ORT - outer retinal tubulation. SRHM - subretinal hyper-reflective material            ASSESSMENT/PLAN:    ICD-10-CM   1. Ocular  inflammation  H57.89 OCT, Retina - OU - Both Eyes    2. Proliferative vitreoretinopathy of right  eye  H35.21 OCT, Retina - OU - Both Eyes    3. Traction detachment of right retina  H33.41     4. 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 Essential hypertension  I10     8. Hypertensive retinopathy of both eyes  H35.033     9. Combined forms of age-related cataract of both eyes  H25.813     10. Pseudophakia  Z96.1     11. Retained lens matter of right eye  H59.021       1-3. Ocular inflammation and PVR retinal detachment OD - 10.16.24 pt started seeing new floaters and "sparks" in vision - pt developed progressively worsening vision and vit haze - VA OD decreased to LP despite aggressive anti-inflammatory medical therapy - pt was on PredForte q1h OD, Prolensa  QID OD, and 40 mg po Prednisone  daily - b-scan 12.04.24 shows significant mobile vit opacities, ?RD - s/p PPV/PFO/EL/FAX/SO OD, 12.05.2024  - undiluted vit samples obtained intra-op for gram stain and culture -- no organisms, but +WBCs - intra-op - clearance of vitreous haze showed extensive fibrosis and PVR tractional detachment -- silicon oil added at end of case - IOP OD 09 -- continue brimonidine  and Cosopt  TID OD - AC was shallow with angle closed -- but now deep s/p CEIOL - exam shows residual intraretinal fibrosis and persistent SRF             - cont   PF QID OD                          PSO ung BID OD   - cont  brimondidine OD - discussed repeat PPV to remove scar tissue, repair residual RD -- also needs lensectomy (see below) - pt wishes to proceed with surgery on May 15tj - f/u May 16 for POV1, DFE  4,5. CSCR OD -- chronic  - pt reports decreased vision OD since December 2019  - reports significant stressors in life -- work  - denies steroid use - FA (09.30.20) shows focal hyperfluorescent leakage points nasal macula OD --  expansile dot phenotype? - started 50 mg po eplerenone  since 09.30.20  -- pt self d/c in February, re-started in June 2021 - s/p IVA OD #1 (09.01.21), #2 (09.29.21), #3 (11.03.21), #4 (12.03.21) -- IVA resistance - s/p IVE OD #1 (01.20.22), #2 (02.18.22), #3 (04.05.22-sample), #4 (05.23.22), # 5 (06.27.22), #6 (07.28.22), #7 (08.26.22), #8 (09.23.22), #9 (10.21.22), #10 (11.18.22), #11 (12.16.22), #12 (01.20.23), #13 (03.02.23), #14 (04.07.23), #15 (05.12.23), #16 (06.09.23), #17 (07.14.23), #18 (08.08.23), #19 (09.08.23), #20 (10.06.23), #21 (11.06.23), #22 (12.04.23), #23 (01.08.24), #24 (02.12.24), #25 (03.18.24), #26 (04.22.24), #27 (05.20.24) -- IVE resistance ======================================================== - s/p IVV OD #1 (07.02.24), #2 (07.30.24), #3 (08.27.24), #4 (09.26.24), #5 (10.24.24)  - differential includes atypical exudative ARMD - IVV informed consent obtained and signed, 07.02.24  - stop po eplerenone  50 mg daily for now  - monitor for now  6. Retinoschisis OU (OD > OS)  - bullous retinoschisis cavity IT quadrant OD-- 1610-9604 oclock  - bullous retinoschisis cavity IT quadrant OS -- 5409-8119 oclock  - no associated retinal holes/RT/RD on scleral depression   -  widefield [employer] imaging (04.22.24) shows mild interval progression of schisis cavities' size and posterior extension in comparison to baseline images  - s/p laser retinopexy OS 08.12.24 -- good laser changes surrounding - s/p laser retinopexy OD (10.07.24) - new schisis cavity found on examination OD (10.28.24) - 1030 periphery -- no RT/RD on scleral depressed exam - s/p laser retinopexy OD to new schisis at 1030 on 10.28.24 - monitor  7,8. Hypertensive retinopathy OU  - discussed importance of tight BP control  - monitor  9. Mixed Cataract OS - The symptoms of cataract, surgical options, and treatments and risks were discussed with patient. - discussed diagnosis and progression - not yet visually  significant - monitor for now  10,11. Pseudophakia OD  - s/p CE/IOL OD (Dr. Marvin Slot, 04.04.25)  - complicated PC rupture and retained lens material in vitreous  - IOL with mild nasal displacement but appears stably scarred in place  - monitor   Ophthalmic Meds Ordered this visit:  No orders of the defined types were placed in this encounter.    Return in about 3 weeks (around 02/24/2024) for POV1 -- PPV w/ lensectomy, SOX OD.  There are no Patient Instructions on file for this visit.  This document serves as a record of services personally performed by Jeanice Millard, MD, PhD. It was created on their behalf by Morley Arabia. Bevin Bucks, OA an ophthalmic technician. The creation of this record is the provider's dictation and/or activities during the visit.    Electronically signed by: Morley Arabia. Bevin Bucks, OA 02/05/24 2:41 AM  Jeanice Millard, M.D., Ph.D. Diseases & Surgery of the Retina and Vitreous [facility]  I have reviewed the above documentation for accuracy and completeness, and I agree with the above. Jeanice Millard, M.D., Ph.D. 02/05/24 2:46 AM    Abbreviations: M myopia (nearsighted); A astigmatism; H hyperopia (farsighted); P presbyopia; Mrx spectacle prescription;  CTL contact lenses; OD right eye; OS left eye; OU both eyes  XT exotropia; ET esotropia; PEK punctate epithelial keratitis; PEE punctate epithelial erosions; DES dry eye syndrome; MGD meibomian gland dysfunction; ATs artificial tears; PFAT's preservative free artificial tears; NSC nuclear sclerotic cataract; PSC posterior subcapsular cataract; ERM epi-retinal membrane; PVD posterior vitreous detachment; RD retinal detachment; DM diabetes mellitus; DR diabetic retinopathy; NPDR non-proliferative diabetic retinopathy; PDR proliferative diabetic retinopathy; CSME clinically significant macular edema; DME diabetic macular edema; dbh dot blot hemorrhages; CWS cotton wool spot; POAG primary open angle glaucoma;  C/D cup-to-disc ratio; HVF humphrey visual field; GVF goldmann visual field; OCT optical coherence tomography; IOP intraocular pressure; BRVO Branch retinal vein occlusion; CRVO central retinal vein occlusion; CRAO central retinal artery occlusion; BRAO branch retinal artery occlusion; RT retinal tear; SB scleral buckle; PPV pars plana vitrectomy; VH Vitreous hemorrhage; PRP panretinal laser photocoagulation; IVK intravitreal kenalog ; VMT vitreomacular traction; MH Macular hole;  NVD neovascularization of the disc; NVE neovascularization elsewhere; AREDS age related eye disease study; ARMD age related macular degeneration; POAG primary open angle glaucoma; EBMD epithelial/anterior basement membrane dystrophy; ACIOL anterior chamber intraocular lens; IOL intraocular lens; PCIOL posterior chamber intraocular lens; Phaco/IOL phacoemulsification with intraocular lens placement; PRK photorefractive keratectomy; LASIK laser assisted in situ keratomileusis; HTN hypertension; DM diabetes mellitus; COPD chronic obstructive pulmonary disease

## 2024-02-03 ENCOUNTER — Ambulatory Visit
Admission: RE | Admit: 2024-02-03 | Discharge: 2024-02-03 | Disposition: A | Discharge status: Home / Self Care | Source: Ambulatory Visit | Attending: Obstetrics and Gynecology | Admitting: Obstetrics and Gynecology

## 2024-02-03 ENCOUNTER — Ambulatory Visit (INDEPENDENT_AMBULATORY_CARE_PROVIDER_SITE_OTHER): Admitting: Ophthalmology

## 2024-02-03 ENCOUNTER — Encounter (INDEPENDENT_AMBULATORY_CARE_PROVIDER_SITE_OTHER): Payer: Self-pay | Admitting: Ophthalmology

## 2024-02-03 DIAGNOSIS — H3341 Traction detachment of retina, right eye: Secondary | ICD-10-CM | POA: Diagnosis not present

## 2024-02-03 DIAGNOSIS — H3521 Other non-diabetic proliferative retinopathy, right eye: Secondary | ICD-10-CM

## 2024-02-03 DIAGNOSIS — H5789 Other specified disorders of eye and adnexa: Secondary | ICD-10-CM | POA: Diagnosis not present

## 2024-02-03 DIAGNOSIS — H353211 Exudative age-related macular degeneration, right eye, with active choroidal neovascularization: Secondary | ICD-10-CM

## 2024-02-03 DIAGNOSIS — H33103 Unspecified retinoschisis, bilateral: Secondary | ICD-10-CM

## 2024-02-03 DIAGNOSIS — H59021 Cataract (lens) fragments in eye following cataract surgery, right eye: Secondary | ICD-10-CM

## 2024-02-03 DIAGNOSIS — Z1231 Encounter for screening mammogram for malignant neoplasm of breast: Secondary | ICD-10-CM

## 2024-02-03 DIAGNOSIS — I10 Essential (primary) hypertension: Secondary | ICD-10-CM

## 2024-02-03 DIAGNOSIS — H35711 Central serous chorioretinopathy, right eye: Secondary | ICD-10-CM | POA: Diagnosis not present

## 2024-02-03 DIAGNOSIS — Z961 Presence of intraocular lens: Secondary | ICD-10-CM

## 2024-02-03 DIAGNOSIS — H35033 Hypertensive retinopathy, bilateral: Secondary | ICD-10-CM

## 2024-02-03 DIAGNOSIS — H25813 Combined forms of age-related cataract, bilateral: Secondary | ICD-10-CM

## 2024-02-05 ENCOUNTER — Encounter (INDEPENDENT_AMBULATORY_CARE_PROVIDER_SITE_OTHER): Payer: Self-pay | Admitting: Ophthalmology

## 2024-02-07 ENCOUNTER — Encounter: Payer: Self-pay | Admitting: Obstetrics and Gynecology

## 2024-02-14 NOTE — Progress Notes (Signed)
 [facility] - Clinic Note  02/24/2024     CHIEF COMPLAINT Patient presents for Post-op Follow-up   HISTORY OF PRESENT ILLNESS: [name] is a 53 y.o. female who presents to the clinic today for:  HPI     Post-op Follow-up   In right eye.  Discomfort includes pain and tearing.  Negative for itching, foreign body sensation, discharge and floaters.  I, the attending physician,  performed the HPI with the patient and updated documentation appropriately.        Comments   Pt present for 1 day post op check. Pt states she is in a lot of sharp pain in the left eye. Pt has kept eye covered since surgery. Pt feels like the bandage is very tight. Pt is not having sensitivity to light.      Last edited by Ronelle Coffee, MD on 02/28/2024 12:00 AM.    Pt states last night went well, her eye hurts this morning especially when she tries to close her eyes  Referring physician: Gabino Joe, PA-C [address] [place],  [place] [address]  HISTORICAL INFORMATION:   Selected notes from the MEDICAL RECORD NUMBER Referred by Dr. Cleone Dad for concern of sub-macular fluid / retinoschisis   CURRENT MEDICATIONS: Current Outpatient Medications (Ophthalmic Drugs)  Medication Sig   Brinzolamide-Brimonidine  (SIMBRINZA) 1-0.2 % SUSP Place 1 drop into the right eye in the morning, at noon, and at bedtime.   prednisoLONE  acetate (PRED FORTE ) 1 % ophthalmic suspension Place 1 drop into the right eye 4 (four) times daily.   No current facility-administered medications for this visit. (Ophthalmic Drugs)   Current Outpatient Medications (Other)  Medication Sig   acetaminophen  (TYLENOL ) 500 MG tablet Take 500-1,000 mg by mouth every 6 (six) hours as needed (pain.).   hydrochlorothiazide (HYDRODIURIL) 25 MG tablet Take 25 mg by mouth daily.   ibuprofen  (ADVIL ) 200 MG tablet Take 200-400 mg by mouth every 8 (eight) hours as needed (pain.).    lisinopril  (PRINIVIL ,ZESTRIL ) 20 MG tablet Take 1 tablet (20 mg total) by mouth daily.   No current facility-administered medications for this visit. (Other)   REVIEW OF SYSTEMS: ROS   Positive for: Cardiovascular, Eyes Negative for: Constitutional, Gastrointestinal, Neurological, Skin, Genitourinary, Musculoskeletal, HENT, Endocrine, Respiratory, Psychiatric, Allergic/Imm, Heme/Lymph Last edited by Carrington Clack, COT on 02/24/2024  8:14 AM.      ALLERGIES No Known Allergies  PAST MEDICAL HISTORY 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   followed by dr Geneveive Furness;  pt takes  inspra  for excess fluid in retina,   Sciatic nerve injury    Seasonal allergies    Vaginal dysplasia    Wears glasses    Past Surgical History:  Procedure Laterality Date   CERVICAL CONIZATION W/BX N/A 06/30/2023   Procedure: CONIZATION CERVIX WITH BIOPSY, ENDOCERVICAL CURETTAGE;  Surgeon: Suzi Essex, MD;  Location: [facility];  Service: Gynecology;  Laterality: N/A;   CO2 LASER APPLICATION N/A 06/30/2023   Procedure: CO2 LASER TO THE VAGINA;  Surgeon: Wiley Hanger  R, MD;  Location: [facility];  Service: Gynecology;  Laterality: N/A;   COLONOSCOPY  12/14/2022   HYSTEROSCOPY WITH RESECTOSCOPE  11/25/2006   @[facility]  by dr Kirk Peper. [name];   Resectoscopic myomectomy and polypectomy   INJECTION OF SILICONE OIL Right 09/15/2023   Procedure: INJECTION OF SILICONE OIL;  Surgeon: Ronelle Coffee, MD;  Location: [facility] OR;  Service: Ophthalmology;  Laterality: Right;   INJECTION OF SILICONE OIL Right 02/23/2024   Procedure: INJECTION, SILICONE OIL EXCHANGE;   Surgeon: Ronelle Coffee, MD;  Location: [facility] OR;  Service: Ophthalmology;  Laterality: Right;   PARS PLANA VITRECTOMY Right 09/15/2023   Procedure: PARS PLANA VITRECTOMY WITH 25 GAUGE;  Surgeon: Ronelle Coffee, MD;  Location: [facility] OR;  Service: Ophthalmology;  Laterality: Right;   PARS PLANA VITRECTOMY W/ FOCAL ENDOLASER PHOTOCOAGULATION Right 02/23/2024   Procedure: 25 GAUGE PARS PLANA VITRECTOMY  WITH LENSECTOMY, MEMBRANE PEEL, AND ENDOLASER;  Surgeon: Ronelle Coffee, MD;  Location: [facility] OR;  Service: Ophthalmology;  Laterality: Right;   REMOVAL RETAINED LENS Right 02/23/2024   Procedure: REMOVAL, RETAINED LENS MATTER;  Surgeon: Ronelle Coffee, MD;  Location: [facility] OR;  Service: Ophthalmology;  Laterality: Right;   REPAIR OF COMPLEX TRACTION RETINAL DETACHMENT Right 02/23/2024   Procedure: REPAIR, RETINAL DETACHMENT, COMPLEX;  Surgeon: Ronelle Coffee, MD;  Location: [facility] OR;  Service: Ophthalmology;  Laterality: Right;   VAGINECTOMY, PARTIAL N/A 06/30/2023   Procedure: SMALL VAGINECTOMY, PARTIAL BIOSPIES;  Surgeon: Suzi Essex, MD;  Location: [facility];  Service: Gynecology;  Laterality: N/A;   FAMILY HISTORY Family History  Problem Relation Age of Onset   Hypertension Mother    Diabetes Father    Diabetes Maternal Grandmother    Hypertension Maternal Grandmother    Colon cancer Neg Hx    Colon polyps Neg Hx    Esophageal cancer Neg Hx    Rectal cancer Neg Hx    Stomach cancer Neg Hx    SOCIAL HISTORY Social History   Tobacco Use   Smoking status: Never   Smokeless tobacco: Never  Vaping Use   Vaping status: Never Used  Substance Use Topics   Alcohol use: No    Alcohol/week: 0.0 standard drinks of alcohol   Drug use: Never       OPHTHALMIC EXAM: Base Eye Exam     Visual Acuity (Snellen - Linear)       Right Left   Dist [place] LP 20/25   Dist ph Lee NI          Tonometry (Tonopen, 8:09 AM)       Right Left   Pressure 12 14         Pupils       Dark Light  Shape React APD   Right 6 6 Round NR None   Left 3 2 Round Brisk NR         Visual Fields       Left Right    Full    Restrictions  Total superior temporal, inferior temporal, superior nasal, inferior nasal deficiencies         Extraocular Movement       Right Left    Full, Ortho Full, Ortho         Neuro/Psych     Oriented x3: Yes   Mood/Affect: Normal         Dilation     Right eye: still dilated @ 8:11 AM  Slit Lamp and Fundus Exam     Slit Lamp Exam       Right Left   Lids/Lashes Dermatochalasis - upper lid Dermatochalasis - upper lid   Conjunctiva/Sclera nasal pinguecula, 3+ Injection, sutures intact 1+ Injection, nasal Pinguecula   Cornea Nasal ptergyium extending 2.74mm onto nasal cornea, peripheral haze, well healed cataract wound, 2+ Punctate epithelial erosions 1+Punctate epithelial erosions   Anterior Chamber Deep, silicone oil bubbles deep, narrow temporal angle, 1+pigment   Iris Irregular dilation, +PS temporally Round and dilated   Lens 3 piece sulcus IOL with mild nasal displacement and open/ruptured PC 2+ Nuclear sclerosis, 2+ Cortical cataract   Anterior Vitreous post vitrectomy, good silicone oil fill, lens remnant gone Mild Vitreous syneresis         Fundus Exam       Right Left   Disc hazy view, perfused    C/D Ratio 0.4 0.6   Macula Grossly attached under oil    Vessels Attenuated, mild tortuosity; fibrosis    Periphery 360 laser, dense fibrosis temporal periphery (0900)            IMAGING AND PROCEDURES  Imaging and Procedures for @TODAY @          ASSESSMENT/PLAN:    ICD-10-CM   1. Ocular inflammation  H57.89     2. 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 Traction detachment of right retina  H33.41     4. 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 Exudative age-related macular degeneration of right eye with active choroidal neovascularization (HCC)  H35.3211      6. 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 Essential hypertension  I10     8. Hypertensive retinopathy of both eyes  H35.033     9. Combined forms of age-related cataract of both eyes  H25.813     10. Pseudophakia  Z96.1     11. Retained lens matter of right eye  H59.021       1-3. Ocular inflammation and PVR retinal detachment OD - 10.16.24 pt started seeing new floaters and "sparks" in vision - pt developed progressively worsening vision and vit haze - VA OD decreased to LP despite aggressive anti-inflammatory medical therapy - pt was on PredForte q1h OD, Prolensa  QID OD, and 40 mg po Prednisone  daily - b-scan 12.04.24 shows significant mobile vit opacities, ?RD - s/p PPV/PFO/EL/FAX/SO OD, 12.05.2024  - undiluted vit samples obtained intra-op for gram stain and culture -- no organisms, but +WBCs - intra-op - clearance of vitreous haze showed extensive fibrosis and PVR tractional detachment -- silicon oil added at end of case - AC was shallow with angle closed -- but now deep s/p CEIOL - exam shows residual intraretinal fibrosis and persistent SRF - s/p POD1 s/p PPV/lensectomy/membrane peel/relaxing retinotomy/silicon oil exchange OD, 05.15.2025             - doing well this morning; lens material gone; good oil fill             - IOP okay at 12             - start   PF 6x/day OD                          zymaxid  QID OD  ketorolac  QID OD   Atropine  BID OD                         PSO ung QID OD              - cont face down positioning x3 days; avoid laying flat on back              - eye shield when sleeping              - post op drop and positioning instructions reviewed              - tylenol /ibuprofen  for pain  - f/u May 22 for POW1, DFE  4,5. CSCR OD -- chronic  - pt reports decreased vision OD since December 2019  - reports significant stressors in life -- work  - denies steroid use - FA (09.30.20) shows focal hyperfluorescent leakage  points nasal macula OD -- expansile dot phenotype? - started 50 mg po eplerenone  since 09.30.20  -- pt self d/c in February, re-started in June 2021 - s/p IVA OD #1 (09.01.21), #2 (09.29.21), #3 (11.03.21), #4 (12.03.21) -- IVA resistance - s/p IVE OD #1 (01.20.22), #2 (02.18.22), #3 (04.05.22-sample), #4 (05.23.22), # 5 (06.27.22), #6 (07.28.22), #7 (08.26.22), #8 (09.23.22), #9 (10.21.22), #10 (11.18.22), #11 (12.16.22), #12 (01.20.23), #13 (03.02.23), #14 (04.07.23), #15 (05.12.23), #16 (06.09.23), #17 (07.14.23), #18 (08.08.23), #19 (09.08.23), #20 (10.06.23), #21 (11.06.23), #22 (12.04.23), #23 (01.08.24), #24 (02.12.24), #25 (03.18.24), #26 (04.22.24), #27 (05.20.24) -- IVE resistance ======================================================== - s/p IVV OD #1 (07.02.24), #2 (07.30.24), #3 (08.27.24), #4 (09.26.24), #5 (10.24.24)  - differential includes atypical exudative ARMD - IVV informed consent obtained and signed, 07.02.24  - stop po eplerenone  50 mg daily for now  - monitor for now  6. Retinoschisis OU (OD > OS)  - bullous retinoschisis cavity IT quadrant OD-- 5621-3086 oclock  - bullous retinoschisis cavity IT quadrant OS -- 5784-6962 oclock  - no associated retinal holes/RT/RD on scleral depression   - widefield Optos imaging (04.22.24) shows mild interval progression of schisis cavities' size and posterior extension in comparison to baseline images  - s/p laser retinopexy OS 08.12.24 -- good laser changes surrounding - s/p laser retinopexy OD (10.07.24) - new schisis cavity found on examination OD (10.28.24) - 1030 periphery -- no RT/RD on scleral depressed exam - s/p laser retinopexy OD to new schisis at 1030 on 10.28.24 - monitor  7,8. Hypertensive retinopathy OU  - discussed importance of tight BP control  - monitor  9. Mixed Cataract OS - The symptoms of cataract, surgical options, and treatments and risks were discussed with patient. - discussed diagnosis and  progression - not yet visually significant - monitor for now  10,11. Pseudophakia OD  - s/p CE/IOL OD (Dr. Marvin Slot, 04.04.25)  - complicated PC rupture and retained lens material in vitreous  - 3-piece IOL with mild nasal displacement but appears stably scarred in place  - monitor   Ophthalmic Meds Ordered this visit:  No orders of the defined types were placed in this encounter.    Return in about 6 days (around 03/01/2024) for f/u RD OD, DFE, OCT.  There are no Patient Instructions on file for this visit.  This document serves as a record of services personally performed by Jeanice Millard, MD, PhD. It was created on their behalf by Morley Arabia. Bevin Bucks, OA an ophthalmic technician. The creation of this record is the provider's dictation  and/or activities during the visit.    Electronically signed by: Morley Arabia. Bevin Bucks, OA 02/28/24 12:02 AM  Jeanice Millard, M.D., Ph.D. Diseases & Surgery of the Retina and Vitreous [facility]  I have reviewed the above documentation for accuracy and completeness, and I agree with the above. Jeanice Millard, M.D., Ph.D. 02/28/24 12:06 AM   Abbreviations: M myopia (nearsighted); A astigmatism; H hyperopia (farsighted); P presbyopia; Mrx spectacle prescription;  CTL contact lenses; OD right eye; OS left eye; OU both eyes  XT exotropia; ET esotropia; PEK punctate epithelial keratitis; PEE punctate epithelial erosions; DES dry eye syndrome; MGD meibomian gland dysfunction; ATs artificial tears; PFAT's preservative free artificial tears; NSC nuclear sclerotic cataract; PSC posterior subcapsular cataract; ERM epi-retinal membrane; PVD posterior vitreous detachment; RD retinal detachment; DM diabetes mellitus; DR diabetic retinopathy; NPDR non-proliferative diabetic retinopathy; PDR proliferative diabetic retinopathy; CSME clinically significant macular edema; DME diabetic macular edema; dbh dot blot hemorrhages; CWS cotton wool spot; POAG  primary open angle glaucoma; C/D cup-to-disc ratio; HVF humphrey visual field; GVF goldmann visual field; OCT optical coherence tomography; IOP intraocular pressure; BRVO Branch retinal vein occlusion; CRVO central retinal vein occlusion; CRAO central retinal artery occlusion; BRAO branch retinal artery occlusion; RT retinal tear; SB scleral buckle; PPV pars plana vitrectomy; VH Vitreous hemorrhage; PRP panretinal laser photocoagulation; IVK intravitreal kenalog ; VMT vitreomacular traction; MH Macular hole;  NVD neovascularization of the disc; NVE neovascularization elsewhere; AREDS age related eye disease study; ARMD age related macular degeneration; POAG primary open angle glaucoma; EBMD epithelial/anterior basement membrane dystrophy; ACIOL anterior chamber intraocular lens; IOL intraocular lens; PCIOL posterior chamber intraocular lens; Phaco/IOL phacoemulsification with intraocular lens placement; PRK photorefractive keratectomy; LASIK laser assisted in situ keratomileusis; HTN hypertension; DM diabetes mellitus; COPD chronic obstructive pulmonary disease

## 2024-02-21 ENCOUNTER — Other Ambulatory Visit: Payer: Self-pay

## 2024-02-21 ENCOUNTER — Encounter (HOSPITAL_COMMUNITY): Payer: Self-pay | Admitting: Ophthalmology

## 2024-02-21 NOTE — H&P (Signed)
 [name] is an 53 y.o. female.    Chief Complaint: retinal detachment and retained lens material, RIGHT EYE  HPI: Pt with history of a tractional retinal detachment s/p PPV w/ silicon oil OD on 12.05.2024. Post-operatively pt had a persistent tractional retinal detachment and developed a visually significant cataract. Pt underwent cataract extraction OD on 04.04.25 that was complicated by PC rupture and retained lens material in vitreous. After a discussion of the risks benefits and alternatives to surgery, the patient has elected to proceed with surgery to remove the retained lens material and reattach the retina via 25g PPV w/ lensectomy, membrane peel, possible retinotomy/retinectomy, endolaser and silicon oil exchange under general anesthesia.   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   followed by dr [name];  pt takes  inspra  for excess fluid in retina,   Sciatic nerve injury    Seasonal allergies    Vaginal dysplasia    Wears glasses     Past Surgical History:  Procedure Laterality Date   CERVICAL CONIZATION W/BX N/A 06/30/2023   Procedure: CONIZATION CERVIX WITH BIOPSY, ENDOCERVICAL CURETTAGE;  Surgeon: Connie Essex, MD;  Location: [facility] [place];  Service: Gynecology;  Laterality: N/A;   CO2 LASER APPLICATION N/A 06/30/2023   Procedure: CO2 LASER TO THE VAGINA;  Surgeon: Connie Essex, MD;  Location: [facility];  Service: Gynecology;  Laterality: N/A;   COLONOSCOPY  12/14/2022   HYSTEROSCOPY WITH RESECTOSCOPE  11/25/2006   @[facility]  by dr [name];   Resectoscopic myomectomy and polypectomy    INJECTION OF SILICONE OIL Right 09/15/2023   Procedure: INJECTION OF SILICONE OIL;  Surgeon: Connie Coffee, MD;  Location: [facility] OR;  Service: Ophthalmology;  Laterality: Right;   PARS PLANA VITRECTOMY Right 09/15/2023   Procedure: PARS PLANA VITRECTOMY WITH 25 GAUGE;  Surgeon: Connie Coffee, MD;  Location: [facility] OR;  Service: Ophthalmology;  Laterality: Right;   VAGINECTOMY, PARTIAL N/A 06/30/2023   Procedure: SMALL VAGINECTOMY, PARTIAL BIOSPIES;  Surgeon: Connie Essex, MD;  Location: [facility];  Service: Gynecology;  Laterality: N/A;    Family History  Problem Relation Age of Onset   Hypertension Mother    Diabetes Father    Diabetes Maternal Grandmother    Hypertension Maternal Grandmother    Colon cancer Neg Hx    Colon polyps Neg Hx    Esophageal cancer Neg Hx    Rectal cancer Neg Hx    Stomach cancer Neg Hx    Social History:  reports that she has never smoked. She has never used smokeless tobacco. She reports that she does not drink alcohol and does not use drugs.  Allergies: No Known Allergies  No medications prior to admission.    Review of systems otherwise negative  Height 5\' 4"  (1.626 m), weight 78.5 kg, last menstrual period 02/18/2022.  Physical exam: Mental status: oriented x3. Eyes: See eye exam associated with this date of surgery Ears, Nose, Throat: within normal limits Neck: Within Normal limits General: within normal limits Chest: Within normal limits Breast: deferred Heart: Within normal  limits Abdomen: Within normal limits GU: deferred Extremities: within normal limits Skin: within normal limits  Assessment/Plan 1. Retained lens material, RIGHT EYE 2. Tractional retinal detachment, RIGHT EYE  Plan: To [facility] for 25g PPV w/ lensectomy, membrane peel, possible retinotomy/retinectomy, endolaser and silicon oil exchange under general anesthesia  [name], M.D., Ph.D. Vitreoretinal Surgeon [facility]

## 2024-02-21 NOTE — Progress Notes (Signed)
 Used [employer] ID [id number] for information and instructions for DOS-Spanish.  PCP/GYN - Dr Jonel Nephew Cardiologist - none  Chest x-ray - n/a EKG - 06/30/23 Stress Test - n/a ECHO - n/a Cardiac Cath - n/a  ICD Pacemaker/Loop - n/a  Sleep Study -  n/a  Diabetes - n/a  Aspirin & Blood Thinner Instructions:  n/a  ERAS - clear liquids til 8:30 AM DOS  Anesthesia review: no  STOP now taking any Aspirin (unless otherwise instructed by your surgeon), Aleve, Naproxen, Ibuprofen , Motrin , Advil , Goody's, BC's, all herbal medications, fish oil, and all vitamins.   Coronavirus Screening Do you have any of the following symptoms:  Cough yes/no: No Fever (>100.38F)  yes/no: No Runny nose yes/no: No Sore throat yes/no: No Difficulty breathing/shortness of breath  yes/no: No  Have you traveled in the last 14 days and where? yes/no: No  Patient verbalized understanding of instructions that were given via phone via Spanish Interpreter ID # [id number].

## 2024-02-22 NOTE — Anesthesia Preprocedure Evaluation (Signed)
 Anesthesia Evaluation  Patient identified by MRN, date of birth, ID band Patient awake    Reviewed: Allergy & Precautions, NPO status , Patient's Chart, lab work & pertinent test results  History of Anesthesia Complications Negative for: history of anesthetic complications  Airway Mallampati: III  TM Distance: >3 FB Neck ROM: Full   Comment: Previous grade I view with Miller 2, easy mask Dental  (+) Dental Advisory Given   Pulmonary neg pulmonary ROS   Pulmonary exam normal breath sounds clear to auscultation       Cardiovascular hypertension (HCTZ, lisinopril ), Pt. on medications (-) angina (-) Past MI, (-) Cardiac Stents and (-) CABG (-) dysrhythmias  Rhythm:Regular Rate:Normal     Neuro/Psych  Headaches, neg Seizures PSYCHIATRIC DISORDERS Anxiety Depression     Neuromuscular disease (sciatica)    GI/Hepatic negative GI ROS, Neg liver ROS,,,  Endo/Other  negative endocrine ROS    Renal/GU negative Renal ROS     Musculoskeletal   Abdominal   Peds  Hematology negative hematology ROS (+) Lab Results      Component                Value               Date                      WBC                      8.7                 09/15/2023                HGB                      14.5                09/15/2023                HCT                      45.1                09/15/2023                MCV                      87.2                09/15/2023                PLT                      404 (H)             09/15/2023              Anesthesia Other Findings   Reproductive/Obstetrics                             Anesthesia Physical Anesthesia Plan  ASA: 2  Anesthesia Plan: General   Post-op Pain Management: Tylenol  PO (pre-op)*   Induction: Intravenous  PONV Risk Score and Plan: 3 and Ondansetron , Dexamethasone , Midazolam , Scopolamine  patch - Pre-op and Treatment may vary due to age or medical  condition  Airway Management Planned: Oral ETT  Additional Equipment:   Intra-op  Plan:   Post-operative Plan: Extubation in OR  Informed Consent: I have reviewed the patients History and Physical, chart, labs and discussed the procedure including the risks, benefits and alternatives for the proposed anesthesia with the patient or authorized representative who has indicated his/her understanding and acceptance.     Dental advisory given and Interpreter used for interview  Plan Discussed with: CRNA and Anesthesiologist  Anesthesia Plan Comments: (Risks of general anesthesia discussed including, but not limited to, sore throat, hoarse voice, chipped/damaged teeth, injury to vocal cords, nausea and vomiting, allergic reactions, lung infection, heart attack, stroke, and death. All questions answered. )       Anesthesia Quick Evaluation

## 2024-02-23 ENCOUNTER — Ambulatory Visit (HOSPITAL_COMMUNITY): Admitting: Anesthesiology

## 2024-02-23 ENCOUNTER — Other Ambulatory Visit: Payer: Self-pay

## 2024-02-23 ENCOUNTER — Ambulatory Visit (HOSPITAL_BASED_OUTPATIENT_CLINIC_OR_DEPARTMENT_OTHER): Admitting: Anesthesiology

## 2024-02-23 ENCOUNTER — Encounter (HOSPITAL_COMMUNITY): Payer: Self-pay | Admitting: Ophthalmology

## 2024-02-23 ENCOUNTER — Encounter (HOSPITAL_COMMUNITY)
Admission: RE | Disposition: A | Discharge status: Home / Self Care | Payer: Self-pay | Source: Home / Self Care | Attending: Ophthalmology

## 2024-02-23 ENCOUNTER — Ambulatory Visit (HOSPITAL_COMMUNITY)
Admission: RE | Admit: 2024-02-23 | Discharge: 2024-02-23 | Disposition: A | Discharge status: Home / Self Care | Attending: Ophthalmology | Admitting: Ophthalmology

## 2024-02-23 DIAGNOSIS — H3341 Traction detachment of retina, right eye: Secondary | ICD-10-CM

## 2024-02-23 DIAGNOSIS — F419 Anxiety disorder, unspecified: Secondary | ICD-10-CM | POA: Insufficient documentation

## 2024-02-23 DIAGNOSIS — H59021 Cataract (lens) fragments in eye following cataract surgery, right eye: Secondary | ICD-10-CM | POA: Diagnosis present

## 2024-02-23 DIAGNOSIS — F418 Other specified anxiety disorders: Secondary | ICD-10-CM | POA: Diagnosis not present

## 2024-02-23 DIAGNOSIS — Z79899 Other long term (current) drug therapy: Secondary | ICD-10-CM | POA: Insufficient documentation

## 2024-02-23 DIAGNOSIS — I10 Essential (primary) hypertension: Secondary | ICD-10-CM

## 2024-02-23 DIAGNOSIS — H59023 Cataract (lens) fragments in eye following cataract surgery, bilateral: Secondary | ICD-10-CM | POA: Diagnosis not present

## 2024-02-23 DIAGNOSIS — F32A Depression, unspecified: Secondary | ICD-10-CM | POA: Insufficient documentation

## 2024-02-23 DIAGNOSIS — H3321 Serous retinal detachment, right eye: Secondary | ICD-10-CM | POA: Diagnosis present

## 2024-02-23 HISTORY — PX: INJECTION OF SILICONE OIL: SHX6422

## 2024-02-23 HISTORY — PX: REMOVAL RETAINED LENS: SHX6464

## 2024-02-23 HISTORY — PX: PARS PLANA VITRECTOMY W/ FOCAL ENDOLASER PHOTOCOAGULATION: SHX7339

## 2024-02-23 HISTORY — PX: REPAIR OF COMPLEX TRACTION RETINAL DETACHMENT: SHX6217

## 2024-02-23 LAB — CBC
HCT: 39.4 % (ref 36.0–46.0)
Hemoglobin: 12.8 g/dL (ref 12.0–15.0)
MCH: 28.3 pg (ref 26.0–34.0)
MCHC: 32.5 g/dL (ref 30.0–36.0)
MCV: 87 fL (ref 80.0–100.0)
Platelets: 561 10*3/uL — ABNORMAL HIGH (ref 150–400)
RBC: 4.53 MIL/uL (ref 3.87–5.11)
RDW: 12.5 % (ref 11.5–15.5)
WBC: 9.2 10*3/uL (ref 4.0–10.5)
nRBC: 0 % (ref 0.0–0.2)

## 2024-02-23 LAB — BASIC METABOLIC PANEL WITH GFR
Anion gap: 12 (ref 5–15)
BUN: 11 mg/dL (ref 6–20)
CO2: 22 mmol/L (ref 22–32)
Calcium: 9.2 mg/dL (ref 8.9–10.3)
Chloride: 103 mmol/L (ref 98–111)
Creatinine, Ser: 0.56 mg/dL (ref 0.44–1.00)
GFR, Estimated: >60 mL/min (ref >60)
Glucose, Bld: 92 mg/dL (ref 70–99)
Potassium: 3.4 mmol/L — ABNORMAL LOW (ref 3.5–5.1)
Sodium: 137 mmol/L (ref 135–145)

## 2024-02-23 SURGERY — REMOVAL, RETAINED LENS MATTER
Anesthesia: General | Site: Eye | Laterality: Right

## 2024-02-23 MED ORDER — FENTANYL CITRATE (PF) 250 MCG/5ML IJ SOLN
INTRAMUSCULAR | Status: AC
  Filled 2024-02-23: qty 5

## 2024-02-23 MED ORDER — BSS PLUS IO SOLN
INTRAOCULAR | Status: DC | PRN
  Administered 2024-02-23 (×2): 1 via INTRAOCULAR

## 2024-02-23 MED ORDER — DEXAMETHASONE SODIUM PHOSPHATE 10 MG/ML IJ SOLN
INTRAMUSCULAR | Status: AC
  Filled 2024-02-23: qty 1

## 2024-02-23 MED ORDER — PHENYLEPHRINE 80 MCG/ML (10ML) SYRINGE FOR IV PUSH (FOR BLOOD PRESSURE SUPPORT)
PREFILLED_SYRINGE | INTRAVENOUS | Status: DC | PRN
  Administered 2024-02-23: 40 ug via INTRAVENOUS

## 2024-02-23 MED ORDER — EPINEPHRINE PF 1 MG/ML IJ SOLN
INTRAMUSCULAR | Status: AC
  Filled 2024-02-23: qty 1

## 2024-02-23 MED ORDER — ROCURONIUM BROMIDE 10 MG/ML (PF) SYRINGE
PREFILLED_SYRINGE | INTRAVENOUS | Status: AC
Start: 2024-02-23 — End: ?
  Filled 2024-02-23: qty 10

## 2024-02-23 MED ORDER — EPINEPHRINE PF 1 MG/ML IJ SOLN
INTRAMUSCULAR | Status: DC | PRN
  Administered 2024-02-23 (×2): .3 mg

## 2024-02-23 MED ORDER — BACITRACIN-POLYMYXIN B 500-10000 UNIT/GM OP OINT
TOPICAL_OINTMENT | OPHTHALMIC | Status: AC
  Filled 2024-02-23: qty 3.5

## 2024-02-23 MED ORDER — PROPOFOL 10 MG/ML IV BOLUS
INTRAVENOUS | Status: DC | PRN
  Administered 2024-02-23: 150 mg via INTRAVENOUS

## 2024-02-23 MED ORDER — ORAL CARE MOUTH RINSE: 15.0000 mL | Freq: Once | OROMUCOSAL | Status: AC

## 2024-02-23 MED ORDER — 0.9 % SODIUM CHLORIDE (POUR BTL) OPTIME
TOPICAL | Status: DC | PRN
  Administered 2024-02-23: 1000 mL

## 2024-02-23 MED ORDER — NA CHONDROIT SULF-NA HYALURON 40-30 MG/ML IO SOSY
INTRAOCULAR | Status: AC
  Filled 2024-02-23: qty 1

## 2024-02-23 MED ORDER — OXYCODONE HCL 5 MG PO TABS
5.0000 mg | ORAL_TABLET | Freq: Once | ORAL | Status: AC | PRN
  Administered 2024-02-23: 5 mg via ORAL

## 2024-02-23 MED ORDER — STERILE WATER FOR INJECTION IJ SOLN
INTRAMUSCULAR | Status: AC
  Filled 2024-02-23: qty 10

## 2024-02-23 MED ORDER — ACETAMINOPHEN 500 MG PO TABS
1000.0000 mg | ORAL_TABLET | Freq: Once | ORAL | Status: AC
Start: 2024-02-23 — End: 2024-02-23
  Administered 2024-02-23: 1000 mg via ORAL
  Filled 2024-02-23: qty 2

## 2024-02-23 MED ORDER — DORZOLAMIDE HCL-TIMOLOL MAL 2-0.5 % OP SOLN
OPHTHALMIC | Status: DC | PRN
  Administered 2024-02-23: 1 [drp] via OPHTHALMIC

## 2024-02-23 MED ORDER — PREDNISOLONE ACETATE 1 % OP SUSP
OPHTHALMIC | Status: AC
  Filled 2024-02-23: qty 5

## 2024-02-23 MED ORDER — ONDANSETRON HCL 4 MG/2ML IJ SOLN
INTRAMUSCULAR | Status: AC
  Filled 2024-02-23: qty 2

## 2024-02-23 MED ORDER — LIDOCAINE 2% (20 MG/ML) 5 ML SYRINGE
INTRAMUSCULAR | Status: AC
  Filled 2024-02-23: qty 5

## 2024-02-23 MED ORDER — PROPOFOL 500 MG/50ML IV EMUL
INTRAVENOUS | Status: DC | PRN
  Administered 2024-02-23: 150 ug/kg/min via INTRAVENOUS

## 2024-02-23 MED ORDER — BRIMONIDINE TARTRATE 0.2 % OP SOLN
OPHTHALMIC | Status: DC | PRN
  Administered 2024-02-23: 1 [drp] via OPHTHALMIC

## 2024-02-23 MED ORDER — SUGAMMADEX SODIUM 200 MG/2ML IV SOLN
INTRAVENOUS | Status: DC | PRN
  Administered 2024-02-23: 200 mg via INTRAVENOUS

## 2024-02-23 MED ORDER — BRILLIANT BLUE G 0.025 % IO SOSY
0.5000 mL | PREFILLED_SYRINGE | INTRAOCULAR | Status: DC
  Filled 2024-02-23: qty 0.5

## 2024-02-23 MED ORDER — PREDNISOLONE ACETATE 1 % OP SUSP
OPHTHALMIC | Status: DC | PRN
  Administered 2024-02-23: 1 [drp] via OPHTHALMIC

## 2024-02-23 MED ORDER — SODIUM CHLORIDE 0.9 % IV SOLN: INTRAVENOUS | Status: DC

## 2024-02-23 MED ORDER — GATIFLOXACIN 0.5 % OP SOLN
OPHTHALMIC | Status: AC
  Filled 2024-02-23: qty 2.5

## 2024-02-23 MED ORDER — MIDAZOLAM HCL 2 MG/2ML IJ SOLN
INTRAMUSCULAR | Status: AC
Start: 2024-02-23 — End: ?
  Filled 2024-02-23: qty 2

## 2024-02-23 MED ORDER — SCOPOLAMINE 1 MG/3DAYS TD PT72
1.0000 | MEDICATED_PATCH | TRANSDERMAL | Status: DC
  Administered 2024-02-23: 1.5 mg via TRANSDERMAL
  Filled 2024-02-23: qty 1

## 2024-02-23 MED ORDER — FENTANYL CITRATE (PF) 100 MCG/2ML IJ SOLN
INTRAMUSCULAR | Status: AC
  Filled 2024-02-23: qty 2

## 2024-02-23 MED ORDER — CHLORHEXIDINE GLUCONATE 0.12 % MT SOLN
15.0000 mL | Freq: Once | OROMUCOSAL | Status: AC
  Administered 2024-02-23: 15 mL via OROMUCOSAL
  Filled 2024-02-23: qty 15

## 2024-02-23 MED ORDER — PROPARACAINE HCL 0.5 % OP SOLN
1.0000 [drp] | OPHTHALMIC | Status: AC | PRN
  Administered 2024-02-23 (×3): 1 [drp] via OPHTHALMIC
  Filled 2024-02-23: qty 15

## 2024-02-23 MED ORDER — LIDOCAINE 2% (20 MG/ML) 5 ML SYRINGE
INTRAMUSCULAR | Status: DC | PRN
  Administered 2024-02-23: 80 mg via INTRAVENOUS

## 2024-02-23 MED ORDER — GATIFLOXACIN 0.5 % OP SOLN OPTIME - NO CHARGE
OPHTHALMIC | Status: DC | PRN
  Administered 2024-02-23: 1 [drp] via OPHTHALMIC

## 2024-02-23 MED ORDER — GLYCOPYRROLATE 0.2 MG/ML IJ SOLN
INTRAMUSCULAR | Status: DC | PRN
  Administered 2024-02-23: .1 mg via INTRAVENOUS

## 2024-02-23 MED ORDER — BSS IO SOLN
INTRAOCULAR | Status: DC | PRN
  Administered 2024-02-23: 15 mL via INTRAOCULAR

## 2024-02-23 MED ORDER — FENTANYL CITRATE (PF) 250 MCG/5ML IJ SOLN
INTRAMUSCULAR | Status: DC | PRN
  Administered 2024-02-23: 50 ug via INTRAVENOUS

## 2024-02-23 MED ORDER — POLYMYXIN B SULFATE 500000 UNITS IJ SOLR
INTRAMUSCULAR | Status: AC
  Filled 2024-02-23: qty 10

## 2024-02-23 MED ORDER — ONDANSETRON HCL 4 MG/2ML IJ SOLN
INTRAMUSCULAR | Status: DC | PRN
  Administered 2024-02-23: 4 mg via INTRAVENOUS

## 2024-02-23 MED ORDER — ATROPINE SULFATE 1 % OP SOLN
1.0000 [drp] | OPHTHALMIC | Status: AC | PRN
  Administered 2024-02-23 (×3): 1 [drp] via OPHTHALMIC
  Filled 2024-02-23 (×2): qty 2

## 2024-02-23 MED ORDER — TRIAMCINOLONE ACETONIDE 40 MG/ML IJ SUSP
INTRAMUSCULAR | Status: AC
  Filled 2024-02-23: qty 5

## 2024-02-23 MED ORDER — LIDOCAINE HCL 1 % IJ SOLN
INTRAMUSCULAR | Status: AC
  Filled 2024-02-23: qty 20

## 2024-02-23 MED ORDER — SODIUM CHLORIDE (PF) 0.9 % IJ SOLN
INTRAMUSCULAR | Status: AC
  Filled 2024-02-23: qty 10

## 2024-02-23 MED ORDER — BSS PLUS IO SOLN
INTRAOCULAR | Status: AC
  Filled 2024-02-23: qty 500

## 2024-02-23 MED ORDER — BSS IO SOLN
INTRAOCULAR | Status: AC
  Filled 2024-02-23: qty 15

## 2024-02-23 MED ORDER — BACITRACIN-POLYMYXIN B 500-10000 UNIT/GM OP OINT
TOPICAL_OINTMENT | OPHTHALMIC | Status: DC | PRN
  Administered 2024-02-23: 1 via OPHTHALMIC

## 2024-02-23 MED ORDER — BRIMONIDINE TARTRATE 0.2 % OP SOLN
OPHTHALMIC | Status: AC
  Filled 2024-02-23: qty 5

## 2024-02-23 MED ORDER — ROCURONIUM BROMIDE 10 MG/ML (PF) SYRINGE
PREFILLED_SYRINGE | INTRAVENOUS | Status: DC | PRN
  Administered 2024-02-23: 50 mg via INTRAVENOUS
  Administered 2024-02-23: 30 mg via INTRAVENOUS
  Administered 2024-02-23: 20 mg via INTRAVENOUS
  Administered 2024-02-23 (×2): 30 mg via INTRAVENOUS

## 2024-02-23 MED ORDER — FENTANYL CITRATE (PF) 100 MCG/2ML IJ SOLN
25.0000 ug | INTRAMUSCULAR | Status: DC | PRN
  Administered 2024-02-23: 25 ug via INTRAVENOUS

## 2024-02-23 MED ORDER — CARBACHOL 0.01 % IO SOLN
INTRAOCULAR | Status: AC
  Filled 2024-02-23: qty 1.5

## 2024-02-23 MED ORDER — STERILE WATER FOR INJECTION IJ SOLN
INTRAMUSCULAR | Status: DC | PRN
  Administered 2024-02-23: .5 mL

## 2024-02-23 MED ORDER — NA CHONDROIT SULF-NA HYALURON 40-30 MG/ML IO SOSY
INTRAOCULAR | Status: DC | PRN
  Administered 2024-02-23: .5 mL via INTRAOCULAR

## 2024-02-23 MED ORDER — PROPOFOL 10 MG/ML IV BOLUS
INTRAVENOUS | Status: AC
  Filled 2024-02-23: qty 20

## 2024-02-23 MED ORDER — ACETAZOLAMIDE SODIUM 500 MG IJ SOLR
INTRAMUSCULAR | Status: AC
  Filled 2024-02-23: qty 500

## 2024-02-23 MED ORDER — CYCLOPENTOLATE HCL 1 % OP SOLN
1.0000 [drp] | OPHTHALMIC | Status: AC | PRN
  Administered 2024-02-23 (×3): 1 [drp] via OPHTHALMIC
  Filled 2024-02-23: qty 2

## 2024-02-23 MED ORDER — BUPIVACAINE HCL (PF) 0.75 % IJ SOLN
INTRAMUSCULAR | Status: AC
  Filled 2024-02-23: qty 10

## 2024-02-23 MED ORDER — TRIAMCINOLONE ACETONIDE 40 MG/ML IJ SUSP
INTRAMUSCULAR | Status: DC | PRN
  Administered 2024-02-23: 1 mL via INTRAMUSCULAR

## 2024-02-23 MED ORDER — DEXAMETHASONE SODIUM PHOSPHATE 10 MG/ML IJ SOLN
INTRAMUSCULAR | Status: DC | PRN
  Administered 2024-02-23: 10 mg via INTRAVENOUS

## 2024-02-23 MED ORDER — AMISULPRIDE (ANTIEMETIC) 5 MG/2ML IV SOLN: 10.0000 mg | Freq: Once | INTRAVENOUS | Status: DC | PRN

## 2024-02-23 MED ORDER — OXYCODONE HCL 5 MG/5ML PO SOLN: 5.0000 mg | Freq: Once | ORAL | Status: AC | PRN

## 2024-02-23 MED ORDER — PHENYLEPHRINE 80 MCG/ML (10ML) SYRINGE FOR IV PUSH (FOR BLOOD PRESSURE SUPPORT)
PREFILLED_SYRINGE | INTRAVENOUS | Status: AC
  Filled 2024-02-23: qty 10

## 2024-02-23 MED ORDER — KETOROLAC TROMETHAMINE 0.5 % OP SOLN
1.0000 [drp] | OPHTHALMIC | Status: AC
  Administered 2024-02-23: 1 [drp] via OPHTHALMIC
  Filled 2024-02-23: qty 3

## 2024-02-23 MED ORDER — PHENYLEPHRINE HCL 10 % OP SOLN
1.0000 [drp] | OPHTHALMIC | Status: AC | PRN
  Administered 2024-02-23 (×3): 1 [drp] via OPHTHALMIC
  Filled 2024-02-23: qty 5

## 2024-02-23 MED ORDER — DORZOLAMIDE HCL-TIMOLOL MAL 2-0.5 % OP SOLN
OPHTHALMIC | Status: AC
  Filled 2024-02-23: qty 10

## 2024-02-23 MED ORDER — MIDAZOLAM HCL 2 MG/2ML IJ SOLN
INTRAMUSCULAR | Status: DC | PRN
  Administered 2024-02-23: 1 mg via INTRAVENOUS

## 2024-02-23 MED ORDER — ATROPINE SULFATE 1 % OP SOLN
OPHTHALMIC | Status: AC
  Filled 2024-02-23: qty 5

## 2024-02-23 MED ORDER — ATROPINE SULFATE 1 % OP SOLN
OPHTHALMIC | Status: DC | PRN
  Administered 2024-02-23: 1 [drp] via OPHTHALMIC

## 2024-02-23 MED ORDER — TROPICAMIDE 1 % OP SOLN
1.0000 [drp] | OPHTHALMIC | Status: AC | PRN
  Administered 2024-02-23 (×3): 1 [drp] via OPHTHALMIC
  Filled 2024-02-23: qty 15

## 2024-02-23 MED ORDER — OXYCODONE HCL 5 MG PO TABS
ORAL_TABLET | ORAL | Status: AC
  Filled 2024-02-23: qty 1

## 2024-02-23 MED ORDER — CEFTAZIDIME 1 G IJ SOLR
INTRAMUSCULAR | Status: AC
  Filled 2024-02-23: qty 1

## 2024-02-23 SURGICAL SUPPLY — 75 items
APPLICATOR COTTON TIP 6 STRL (MISCELLANEOUS) ×10 IMPLANT
APPLICATOR COTTON TIP 6IN STRL (MISCELLANEOUS) ×10 IMPLANT
BAG COUNTER SPONGE SURGICOUNT (BAG) ×1 IMPLANT
BAND WRIST GAS GREEN (MISCELLANEOUS) IMPLANT
BETADINE 5% OPHTHALMIC (OPHTHALMIC) ×2 IMPLANT
BLADE EYE CATARACT 19 1.4 BEAV (BLADE) IMPLANT
BLADE MVR KNIFE 20G (BLADE) ×1 IMPLANT
BNDG EYE OVAL 2 1/8 X 2 5/8 (GAUZE/BANDAGES/DRESSINGS) ×1 IMPLANT
CABLE BIPOLOR RESECTION CORD (MISCELLANEOUS) ×1 IMPLANT
CANNULA ANT CHAM MAIN (OPHTHALMIC RELATED) IMPLANT
CANNULA ANT/CHMB 27G (MISCELLANEOUS) IMPLANT
CANNULA ANT/CHMB 27GA (MISCELLANEOUS) ×1 IMPLANT
CANNULA DUALBORE 25G (CANNULA) IMPLANT
CANNULA FLEX TIP 25G (CANNULA) ×1 IMPLANT
CANNULA TROCAR 23 GA VLV (OPHTHALMIC) IMPLANT
CANNULA TROCAR 23G VLV (OPHTHALMIC) IMPLANT
CANNULA VLV SOFT TIP 25G (OPHTHALMIC) IMPLANT
CANNULA VLV SOFT TIP 25GA (OPHTHALMIC) IMPLANT
CLSR STERI-STRIP ANTIMIC 1/2X4 (GAUZE/BANDAGES/DRESSINGS) ×1 IMPLANT
DRAPE INCISE 51X51 W/FILM STRL (DRAPES) ×1 IMPLANT
DRAPE MICROSCOPE LEICA 46X105 (MISCELLANEOUS) ×1 IMPLANT
DRAPE OPHTHALMIC 77X100 STRL (CUSTOM PROCEDURE TRAY) ×1 IMPLANT
ERASER HMR WETFIELD 23G BP (MISCELLANEOUS) IMPLANT
FILTER STRAW FLUID ASPIR (MISCELLANEOUS) IMPLANT
FORCEPS GRIESHABER ILM 25G A (INSTRUMENTS) IMPLANT
FORCEPS GRIESHABER MAX 25G (MISCELLANEOUS) IMPLANT
GAS AUTO FILL CONSTELLATION (OPHTHALMIC) IMPLANT
GLOVE BIO SURGEON STRL SZ7.5 (GLOVE) ×2 IMPLANT
GLOVE BIOGEL M 7.0 STRL (GLOVE) ×1 IMPLANT
GOWN STRL REUS W/ TWL LRG LVL3 (GOWN DISPOSABLE) ×2 IMPLANT
GOWN STRL REUS W/ TWL XL LVL3 (GOWN DISPOSABLE) ×1 IMPLANT
KIT BASIN OR (CUSTOM PROCEDURE TRAY) ×1 IMPLANT
KIT PERFLUORON PROCEDURE 5ML (MISCELLANEOUS) IMPLANT
LENS BIOM SUPER VIEW SET DISP (MISCELLANEOUS) IMPLANT
LENS VITRECTOMY FLAT OCLR DISP (MISCELLANEOUS) IMPLANT
NDL 18GX1X1/2 (RX/OR ONLY) (NEEDLE) ×1 IMPLANT
NDL 25GX 5/8IN NON SAFETY (NEEDLE) ×5 IMPLANT
NDL HYPO 30X.5 LL (NEEDLE) ×2 IMPLANT
NDL PRECISIONGLIDE 27X1.5 (NEEDLE) IMPLANT
NEEDLE 18GX1X1/2 (RX/OR ONLY) (NEEDLE) ×1 IMPLANT
NEEDLE 25GX 5/8IN NON SAFETY (NEEDLE) ×5 IMPLANT
NEEDLE HYPO 30X.5 LL (NEEDLE) ×2 IMPLANT
NEEDLE PRECISIONGLIDE 27X1.5 (NEEDLE) IMPLANT
NS IRRIG 1000ML POUR BTL (IV SOLUTION) ×1 IMPLANT
OIL SILICONE OPHTHALMIC 1000 (Ophthalmic Related) IMPLANT
PACK VITRECTOMY CUSTOM (CUSTOM PROCEDURE TRAY) ×1 IMPLANT
PAD ARMBOARD POSITIONER FOAM (MISCELLANEOUS) ×2 IMPLANT
PAK PIK VITRECTOMY CVS 25GA (OPHTHALMIC) ×1 IMPLANT
PENCIL BIPOLAR 25GA STR DISP (OPHTHALMIC RELATED) ×1 IMPLANT
PROBE DIATHERMY DSP 27GA (MISCELLANEOUS) IMPLANT
PROBE ENDO DIATHERMY 25G (MISCELLANEOUS) ×1 IMPLANT
PROBE LASER ILLUM FLEX CVD 25G (OPHTHALMIC) IMPLANT
REPL STRA BRUSH NDL (NEEDLE) IMPLANT
REPL STRA BRUSH NEEDLE (NEEDLE) IMPLANT
RESERVOIR BACK FLUSH (MISCELLANEOUS) IMPLANT
RETRACTOR IRIS FLEX 25G GRIESH (INSTRUMENTS) IMPLANT
SHIELD EYE LENSE ONLY DISP (GAUZE/BANDAGES/DRESSINGS) IMPLANT
SPONGE SURGIFOAM ABS GEL 12-7 (HEMOSTASIS) IMPLANT
STOPCOCK 4 WAY LG BORE MALE ST (IV SETS) IMPLANT
SUT CHROMIC 7 0 TG140 8 (SUTURE) ×1 IMPLANT
SUT ETHILON 5.0 S-24 (SUTURE) ×1 IMPLANT
SUT ETHILON 9 0 TG140 8 (SUTURE) ×1 IMPLANT
SUT MERSILENE 4 0 RV 2 (SUTURE) ×1 IMPLANT
SUT SILK 2-0 18XBRD TIE 12 (SUTURE) ×1 IMPLANT
SUT SILK 2-0 18XBRD TIE BLK (SUTURE) ×1 IMPLANT
SUT SILK 4 0 RB 1 (SUTURE) ×1 IMPLANT
SUT VICRYL 7 0 TG140 8 (SUTURE) ×1 IMPLANT
SYR 10ML LL (SYRINGE) ×1 IMPLANT
SYR 20ML LL LF (SYRINGE) ×1 IMPLANT
SYR 5ML LL (SYRINGE) ×1 IMPLANT
SYR BULB EAR ULCER 3OZ GRN STR (SYRINGE) ×1 IMPLANT
SYR TB 1ML LUER SLIP (SYRINGE) IMPLANT
TOWEL GREEN STERILE FF (TOWEL DISPOSABLE) ×1 IMPLANT
TUBING HIGH PRESS EXTEN 6IN (TUBING) ×1 IMPLANT
WATER STERILE IRR 1000ML POUR (IV SOLUTION) ×1 IMPLANT

## 2024-02-23 NOTE — Anesthesia Postprocedure Evaluation (Signed)
 Anesthesia Post Note  Patient: [name]  Procedure(s) Performed: REMOVAL, RETAINED LENS MATTER (Right: Eye) REPAIR, RETINAL DETACHMENT, COMPLEX (Right: Eye) 25 GAUGE PARS PLANA VITRECTOMY  WITH LENSECTOMY, MEMBRANE PEEL, AND ENDOLASER (Right: Eye) INJECTION, SILICONE OIL EXCHANGE (Right: Eye)     Patient location during evaluation: PACU Anesthesia Type: General Level of consciousness: awake and alert Pain management: pain level controlled Vital Signs Assessment: post-procedure vital signs reviewed and stable Respiratory status: spontaneous breathing, nonlabored ventilation, respiratory function stable and patient connected to nasal cannula oxygen Cardiovascular status: blood pressure returned to baseline and stable Postop Assessment: no apparent nausea or vomiting Anesthetic complications: no   No notable events documented.  Last Vitals:  Vitals:   02/23/24 0907 02/23/24 1722  BP: 134/85 132/67  Pulse: 95 (!) 101  Resp: 20 13  Temp: 36.7 C 37.5 C  SpO2: 99% 95%    Last Pain:  Vitals:   02/23/24 1722  PainSc: Asleep   Pain Goal:                   [name]

## 2024-02-23 NOTE — Anesthesia Procedure Notes (Addendum)
 Procedure Name: Intubation Date/Time: 02/23/2024 1:37 PM  Performed by: Chanda Combes, CRNAPre-anesthesia Checklist: Patient identified, Emergency Drugs available, Suction available and Patient being monitored Patient Re-evaluated:Patient Re-evaluated prior to induction Oxygen Delivery Method: Circle system utilized Preoxygenation: Pre-oxygenation with 100% oxygen Induction Type: IV induction Ventilation: Mask ventilation without difficulty Laryngoscope Size: Miller and 2 Grade View: Grade I Tube type: Oral Tube size: 7.0 mm Number of attempts: 1 Airway Equipment and Method: Stylet and Oral airway Placement Confirmation: ETT inserted through vocal cords under direct vision, positive ETCO2 and breath sounds checked- equal and bilateral Secured at: 22 cm Tube secured with: Tape Dental Injury: Teeth and Oropharynx as per pre-operative assessment

## 2024-02-23 NOTE — Interval H&P Note (Signed)
 History and Physical Interval Note:  02/23/2024 11:58 AM  [name]  has presented today for surgery, with the diagnosis of retained lens material, right eye; retinal detachment, right eye.  The various methods of treatment have been discussed with the patient and family. After consideration of risks, benefits and other options for treatment, the patient has consented to  Procedure(s): REMOVAL, RETAINED LENS MATTER (Right) REPAIR, RETINAL DETACHMENT, COMPLEX (Right) as a surgical intervention.  The patient's history has been reviewed, patient examined, no change in status, stable for surgery.  I have reviewed the patient's chart and labs.  Questions were answered to the patient's satisfaction.     Ronelle Coffee

## 2024-02-23 NOTE — Brief Op Note (Signed)
 02/23/2024  5:36 PM  PATIENT:  [name]  53 y.o. female  PRE-OPERATIVE DIAGNOSIS:  retained lens material, right eye; retinal detachment, right eye  POST-OPERATIVE DIAGNOSIS:  retained lens material, right eye; retinal detachment, right eye  PROCEDURE:  Procedure(s): REMOVAL, RETAINED LENS MATTER (Right) REPAIR, RETINAL DETACHMENT, COMPLEX (Right) 25 GAUGE PARS PLANA VITRECTOMY  WITH LENSECTOMY, MEMBRANE PEEL, AND ENDOLASER (Right) INJECTION, SILICONE OIL EXCHANGE (Right)  SURGEON:  Surgeons and Role:    Ronelle Coffee, MD - Primary  ASSISTANTS: Argyle Began, Ophthalmic Assistant    ANESTHESIA:   general  EBL:  10 mL   BLOOD ADMINISTERED:none  DRAINS: none   LOCAL MEDICATIONS USED:  NONE  SPECIMEN:  No Specimen  DISPOSITION OF SPECIMEN:  N/A  COUNTS:  YES  TOURNIQUET:  * No tourniquets in log *  DICTATION: .Note written in EPIC  PLAN OF CARE: Discharge to home after PACU  PATIENT DISPOSITION:  PACU - hemodynamically stable.   Delay start of Pharmacological VTE agent (>24hrs) due to surgical blood loss or risk of bleeding: not applicable

## 2024-02-23 NOTE — Discharge Instructions (Addendum)
POSTOPERATIVE INSTRUCTIONS  Your doctor has performed vitreoretinal surgery on you at [facility]. [facility].  - Keep eye patched and shielded until seen by Dr. [name] 8 AM tomorrow in clinic - Do not use drops until return - FACE DOWN POSITIONING WHILE AWAKE - Sleep with belly down or on left side, avoid laying flat on back.    - No strenuous bending, stooping or lifting.  - You may not drive until further notice.  - Tylenol or any other over-the-counter pain reliever can be used according to your doctor. If more pain medicine is required, your doctor will have a prescription for you.  - You may read, go up and down stairs, and watch television.     [name], M.D., Ph.D.

## 2024-02-23 NOTE — Progress Notes (Signed)
 Interpreter: Charla Condon Phone Number: [phone]

## 2024-02-23 NOTE — Op Note (Signed)
 Date of procedure: 05.08.2025   Surgeon: Ronelle Coffee, M.D., Ph.D    Assistant: Argyle Began, OA   Pre-operative Diagnosis:  1. Retained lens material, Right eye 2. 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 , Right eye   Post-operative diagnosis:  Same   Anesthesia: General   Procedure: 1)     25 gauge pars plana vitrectomy, Right Eye 2)     Pars plana lensectomy, Right Eye 3)     Membrane Peel, Right Eye 4)     Relaxing retinotomy, Right Eye  5)     Perfluoron injection 6)     Endolaser, Right Eye 7)     Silicon oil exchange [1000cs], Right Eye     Complications: none Estimated blood loss: minimal Specimens: none   Brief history:   Pt with history of a tractional retinal detachment w/ proliferative vitreoretinopathy of the right eye s/p 25g PPV w/ endolaser and oil on 12.05.24, underwent cataract extraction complicated by posterior capsular rupture and dislocation of crystalline lens into the vitreous on 04.04.25. The retained lens material along with progression of her tractional retinal detachment has impaired vision and affected activities of daily living.  The risks, benefits, and alternatives were explained to the patient, including pain, bleeding, infection, loss of vision, double vision, droopy eyelids, and need for more surgeries.  Informed consent was obtained from the patient and placed in the chart.     Procedure: The patient was brought to the preoperative holding area where the correct eye was confirmed and marked.  The patient was then brought to the operating room where general endotracheal anesthesia was induced by the Anesthesia team without complication. A time-out was performed to identify the correct patient, eye, procedure, and any allergies. The eye was prepped and draped in the usual sterile ophthalmic fashion followed by placement of a lid speculum.    A 25 gauge trocar was placed in the inferotemporal quadrant 3.5 mm posterior to  the limbus in a beveled fashion. A 4 mm infusion cannula was placed through this trocar, and the infusion cannula was confirmed in the vitreous cavity with no incarceration of retina or choroid prior to turning it on. Two additional 25 gauge trocars were placed in the superonasal and superotemporal quadrants in a similar beveled fashion. The light pipe and cutter were introduced into the eye. Direct vitrectomy was was performed to remove some residual capsule. During the patient's cataract surgery OD, some silicon oil was dislocated into the anterior chamber and was notably distorting our visualization of the posterior pole. An anterior chamber paracentesis was made at 1200 oclock with a 20g MVR blade. A 27g cannula on a 5cc syringe was used to evacuate the silicon oil in the [facility] without complication.   At this time, the silicon oil was completely extracted from the vitreous in exchange for BSS using the viscous fluid syringe. Then, a standard three-port pars plana vitrectomy was performed using the light pipe, vitrectomy probe, and the BIOM viewing system. Again, the pt's vitreous cavity was already devoid of vitreous from her prior retinal detachment surgery. A large lenticular remnant composed of nuclear and cortical material were settled on the macula. These were carefully removed using the light pipe and vitrectomy probe without complication. After complete removal all the lens material, we were able to visualize the retina. Inspection of the posterior pole showed proliferation of her PVR with extensive preretinal and intraretinal scarring and fibrosis in the macula and temporal peripheral quadrant creating a low tractional detachment  with macular dragging.  End-grasping ILM forceps were used to dissect and remove the preretinal membranes from the surface of the retina. Manipulation of other areas of traction revealed extensive intraretinal fibrosis and scarring that could not be peeled safely. Of note, we  found a circumferential break in the retina along the most prominent band of fibrosis in the temporal midzone. In an attempt to relieve some traction and loosen the retina, we created a relaxing retinotomy by removing some of the scar tissue in the temporal midzone using the 25g fine point diathermy and the vitrectomy probe. This loosened the retina enough that the macula was able to flatten, however significant traction remained peripherally. Attempts to relieve the peripheral traction were aborted due to poor visualization.   Next, perfluoron was used to flatten the macula and push the subretinal fluid anteriorly.  Endolaser was applied around the relaxing retinotomy and anteriorly. Of note, laser uptake was poor on the anterior side of the retinotomy. Next, A complete fluid-air exchange was performed with a soft tip extrusion cannula over the retinotomy, then posteriorly to remove the perfluoron. After completion of these maneuvers, the retina was flat over the macula. 1000cs silicon oil was injected to the level of the iris plane as we vented the intraocular air through the superonasal trocar and maintained physiologic eye pressure.   The trocars were then removed and sutured with 7-0 vicryl in an interrupted fashion. Subconjunctival injections of antibiotic and Kenalog  were administered. The lid speculum and drapes were removed. Drops of an antibiotic and steroid were given. The eye was patched and shielded. The patient tolerated the procedure well without any intraoperative or immediate postoperative complications. The patient was taken to the recovery room in good condition. The patient was instructed to follow-up with Dr. Karyl Paget in clinic on the following morning.

## 2024-02-23 NOTE — Transfer of Care (Signed)
 Immediate Anesthesia Transfer of Care Note  Patient: [name]  Procedure(s) Performed: REMOVAL, RETAINED LENS MATTER (Right: Eye) REPAIR, RETINAL DETACHMENT, COMPLEX (Right: Eye) 25 GAUGE PARS PLANA VITRECTOMY  WITH LENSECTOMY, MEMBRANE PEEL, AND ENDOLASER (Right: Eye) INJECTION, SILICONE OIL EXCHANGE (Right: Eye)  Patient Location: PACU  Anesthesia Type:General  Level of Consciousness: awake, alert , and oriented  Airway & Oxygen Therapy: Patient Spontanous Breathing and Patient connected to nasal cannula oxygen  Post-op Assessment: Report given to RN and Post -op Vital signs reviewed and stable  Post vital signs: Reviewed and stable  Last Vitals:  Vitals Value Taken Time  BP 132/67 02/23/24 1722  Temp 37.5 C 02/23/24 1722  Pulse 101 02/23/24 1723  Resp 21 02/23/24 1723  SpO2 94 % 02/23/24 1723  Vitals shown include unfiled device data.  Last Pain:  Vitals:   02/23/24 0943  PainSc: 0-No pain         Complications: No notable events documented.

## 2024-02-24 ENCOUNTER — Ambulatory Visit (INDEPENDENT_AMBULATORY_CARE_PROVIDER_SITE_OTHER): Admitting: Ophthalmology

## 2024-02-24 ENCOUNTER — Encounter (INDEPENDENT_AMBULATORY_CARE_PROVIDER_SITE_OTHER): Payer: Self-pay | Admitting: Ophthalmology

## 2024-02-24 DIAGNOSIS — H5789 Other specified disorders of eye and adnexa: Secondary | ICD-10-CM

## 2024-02-24 DIAGNOSIS — H59021 Cataract (lens) fragments in eye following cataract surgery, right eye: Secondary | ICD-10-CM

## 2024-02-24 DIAGNOSIS — H353211 Exudative age-related macular degeneration, right eye, with active choroidal neovascularization: Secondary | ICD-10-CM

## 2024-02-24 DIAGNOSIS — I10 Essential (primary) hypertension: Secondary | ICD-10-CM

## 2024-02-24 DIAGNOSIS — H3341 Traction detachment of retina, right eye: Secondary | ICD-10-CM

## 2024-02-24 DIAGNOSIS — H25813 Combined forms of age-related cataract, bilateral: Secondary | ICD-10-CM

## 2024-02-24 DIAGNOSIS — H35033 Hypertensive retinopathy, bilateral: Secondary | ICD-10-CM

## 2024-02-24 DIAGNOSIS — H35711 Central serous chorioretinopathy, right eye: Secondary | ICD-10-CM

## 2024-02-24 DIAGNOSIS — Z961 Presence of intraocular lens: Secondary | ICD-10-CM

## 2024-02-24 DIAGNOSIS — H3521 Other non-diabetic proliferative retinopathy, right eye: Secondary | ICD-10-CM

## 2024-02-24 DIAGNOSIS — H33103 Unspecified retinoschisis, bilateral: Secondary | ICD-10-CM

## 2024-02-28 ENCOUNTER — Encounter (INDEPENDENT_AMBULATORY_CARE_PROVIDER_SITE_OTHER): Payer: Self-pay | Admitting: Ophthalmology

## 2024-02-29 NOTE — Progress Notes (Signed)
 [facility] - Clinic Note  03/01/2024     CHIEF COMPLAINT Patient presents for Post-op Follow-up   HISTORY OF PRESENT ILLNESS: [name] is a 53 y.o. female who presents to the clinic today for:  HPI     Post-op Follow-up   In right eye.  Discomfort includes pain and tearing.  Negative for itching, foreign body sensation, discharge and floaters.  I, the attending physician,  performed the HPI with the patient and updated documentation appropriately.        Comments   Pt presents for 6 day s/p f/u OD/ocular inflammation and PVR retinal detachment OD. Pt states no improvement in vision. Pt states she has a poking sensation in her eye and it is itching a lot. Pt denies FOL/floaters. Pt states she has been consistent with her drops: PF 6x/day OD, zymaxid  QID OD, ketorolac  QID OD, Atropine  BID OD, PSO ung QID OD.       Last edited by Ronelle Coffee, MD on 03/01/2024  7:14 PM.     Pt states this week went well, she has been keeping her face down, she states some of the drops are burning her    Referring physician: Gabino Joe, PA-C [address] [place],  [place] [address]  HISTORICAL INFORMATION:   Selected notes from the MEDICAL RECORD NUMBER Referred by Dr. Cleone Dad for concern of sub-macular fluid / retinoschisis   CURRENT MEDICATIONS: Current Outpatient Medications (Ophthalmic Drugs)  Medication Sig   Brinzolamide-Brimonidine  (SIMBRINZA) 1-0.2 % SUSP Place 1 drop into the right eye in the morning, at noon, and at bedtime.   ketorolac  (ACULAR ) 0.5 % ophthalmic solution Place 1 drop into the left eye 4 (four) times daily.   bacitracin -polymyxin b  (POLYSPORIN ) ophthalmic ointment Place into the right eye 4 (four) times daily.   Bromfenac  Sodium 0.09 % SOLN INSTILL 1 DROP IN THE MORNING, AT NOON, IN THE EVENING, AND AT BEDTIME. 1 DROP INTO RIGHT EYE 4 TIMES DAILY   prednisoLONE  acetate (PRED FORTE ) 1 % ophthalmic  suspension Place 1 drop into the right eye 6 (six) times daily.   No current facility-administered medications for this visit. (Ophthalmic Drugs)   Current Outpatient Medications (Other)  Medication Sig   acetaminophen  (TYLENOL ) 500 MG tablet Take 500-1,000 mg by mouth every 6 (six) hours as needed (pain.).   hydrochlorothiazide (HYDRODIURIL) 25 MG tablet Take 25 mg by mouth daily.   ibuprofen  (ADVIL ) 200 MG tablet Take 200-400 mg by mouth every 8 (eight) hours as needed (pain.).   lisinopril  (PRINIVIL ,ZESTRIL ) 20 MG tablet Take 1 tablet (20 mg total) by mouth daily.   nitrofurantoin , macrocrystal-monohydrate, (MACROBID ) 100 MG capsule Take 1 capsule by mouth 2 (two) times daily.   predniSONE  (DELTASONE ) 20 MG tablet TAKE 2 TABLETS BY MOUTH ONCE DAILY WITH BREAKFAST   traMADol  (ULTRAM ) 50 MG tablet TAKE 1 TABLET BY MOUTH EVERY 6 HOURS AS NEEDED FOR SEVER PAIN. FOR AFTER SURGERY, DO NOT TAKE AND DRIVE   No current facility-administered medications for this visit. (Other)   REVIEW OF SYSTEMS: ROS   Positive for: Cardiovascular, Eyes Negative for: Constitutional, Gastrointestinal, Neurological, Skin, Genitourinary, Musculoskeletal, HENT, Endocrine, Respiratory, Psychiatric, Allergic/Imm, Heme/Lymph Last edited by Carrington Clack, COT on 03/01/2024  8:18 AM.     ALLERGIES No Known Allergies  PAST MEDICAL HISTORY Past Medical History:  Diagnosis Date   Cervical dysplasia    CIN I (cervical intraepithelial neoplasia I)    High risk  HPV infection    History of abnormal cervical Pap smear    12-28-21 lgsil HPV HR +   History of adenomatous polyp of colon    Hypertension    Hypertensive retinopathy    OU   Macular degeneration, right eye    Retinal defect, 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   followed by dr Rosalynd Mcwright;  pt takes  inspra  for excess fluid in retina,   Sciatic nerve injury    Seasonal allergies    Vaginal dysplasia    Wears glasses    Past Surgical History:  Procedure Laterality Date    CERVICAL CONIZATION W/BX N/A 06/30/2023   Procedure: CONIZATION CERVIX WITH BIOPSY, ENDOCERVICAL CURETTAGE;  Surgeon: Suzi Essex, MD;  Location: [facility];  Service: Gynecology;  Laterality: N/A;   CO2 LASER APPLICATION N/A 06/30/2023   Procedure: CO2 LASER TO THE VAGINA;  Surgeon: Suzi Essex, MD;  Location: [facility];  Service: Gynecology;  Laterality: N/A;   COLONOSCOPY  12/14/2022   HYSTEROSCOPY WITH RESECTOSCOPE  11/25/2006   @[facility]  by dr Kirk Peper. [name];   Resectoscopic myomectomy and polypectomy   INJECTION OF SILICONE OIL Right 09/15/2023   Procedure: INJECTION OF SILICONE OIL;  Surgeon: Ronelle Coffee, MD;  Location: [facility] OR;  Service: Ophthalmology;  Laterality: Right;   INJECTION OF SILICONE OIL Right 02/23/2024   Procedure: INJECTION, SILICONE OIL EXCHANGE;  Surgeon: Ronelle Coffee, MD;  Location: [facility] OR;  Service: Ophthalmology;  Laterality: Right;   PARS PLANA VITRECTOMY Right 09/15/2023   Procedure: PARS PLANA VITRECTOMY WITH 25 GAUGE;  Surgeon: Ronelle Coffee, MD;  Location: [facility] OR;  Service: Ophthalmology;  Laterality: Right;   PARS PLANA VITRECTOMY W/ FOCAL ENDOLASER PHOTOCOAGULATION Right 02/23/2024   Procedure: 25 GAUGE PARS PLANA VITRECTOMY  WITH LENSECTOMY, MEMBRANE PEEL, AND ENDOLASER;  Surgeon: Ronelle Coffee, MD;  Location: [facility] OR;  Service: Ophthalmology;  Laterality: Right;   REMOVAL RETAINED LENS Right 02/23/2024   Procedure: REMOVAL, RETAINED LENS MATTER;  Surgeon: Ronelle Coffee, MD;  Location: [facility] OR;  Service: Ophthalmology;  Laterality: Right;   REPAIR OF COMPLEX TRACTION RETINAL DETACHMENT Right 02/23/2024   Procedure: REPAIR, RETINAL DETACHMENT, COMPLEX;  Surgeon: Ronelle Coffee, MD;  Location: [facility] OR;  Service: Ophthalmology;  Laterality: Right;   VAGINECTOMY, PARTIAL N/A 06/30/2023   Procedure: SMALL VAGINECTOMY, PARTIAL BIOSPIES;  Surgeon: Suzi Essex, MD;  Location: [facility];  Service: Gynecology;   Laterality: N/A;   FAMILY HISTORY Family History  Problem Relation Age of Onset   Hypertension Mother    Diabetes Father    Diabetes Maternal Grandmother    Hypertension Maternal Grandmother    Colon cancer Neg Hx    Colon polyps Neg Hx    Esophageal cancer Neg Hx    Rectal cancer Neg Hx    Stomach cancer Neg Hx    SOCIAL HISTORY Social History   Tobacco Use   Smoking status: Never   Smokeless tobacco: Never  Vaping Use   Vaping status: Never Used  Substance Use Topics   Alcohol use: No    Alcohol/week: 0.0 standard drinks of alcohol   Drug use: Never       OPHTHALMIC EXAM: Base Eye Exam     Visual Acuity (Snellen - Linear)       Right Left   Dist [place] LP 20/25 +2   Dist ph [place] NI 20/20 -2    Correction: Glasses         Tonometry (Tonopen, 8:31 AM)  Right Left   Pressure 13 18         Pupils       Dark Light Shape React APD   Right 6 6 Round None None   Left 4 3 Round Brisk unable to assess         Visual Fields       Left Right    Full    Restrictions  Total superior temporal, inferior temporal, superior nasal, inferior nasal deficiencies         Extraocular Movement       Right Left    Full, Ortho Full, Ortho         Neuro/Psych     Oriented x3: Yes   Mood/Affect: Normal         Dilation     Both eyes: none @ 8:28 AM           Slit Lamp and Fundus Exam     Slit Lamp Exam       Right Left   Lids/Lashes Dermatochalasis - upper lid Dermatochalasis - upper lid   Conjunctiva/Sclera nasal pinguecula, 1+ Injection, sutures intact, Subconjunctival hemorrhage -- improved 1+ Injection, nasal Pinguecula   Cornea Nasal ptergyium extending 2.37mm onto nasal cornea -- less inflamed, peripheral haze, well healed cataract wound, 2+ Punctate epithelial erosions, 1+ Guttata 1+Punctate epithelial erosions   Anterior Chamber Deep, silicone oil bubbles deep, narrow temporal angle, 1+pigment   Iris Irregular dilation, +PS temporally  Round and dilated   Lens 3 piece sulcus IOL with mild nasal displacement and open PC 2+ Nuclear sclerosis, 2+ Cortical cataract   Anterior Vitreous post vitrectomy, good silicone oil fill, lens remnant gone Mild Vitreous syneresis         Fundus Exam       Right Left   Disc hazy view, 2-3+pallor    C/D Ratio 0.4 0.6   Macula hazy view, flat under oil    Vessels Attenuated, mild tortuosity; fibrosis improved    Periphery 360 laser, flat under oil, relaxing retinotomy temporally, dense fibrosis temporal periphery improved            Refraction     Wearing Rx       Sphere Cylinder Axis   Right +1.25 +1.25 112   Left +2.25 +1.00 101           IMAGING AND PROCEDURES  Imaging and Procedures for @TODAY @  OCT, Retina - OU - Both Eyes        Right Eye Quality was poor. Findings include no IRF, no SRF, abnormal foveal contour, subretinal hyper-reflective material, epiretinal membrane, pigment epithelial detachment, outer retinal atrophy (Very poor image, retina attached, atrophic).   Left Eye Quality was good. Central Foveal Thickness: 298. Progression has been stable. Findings include normal foveal contour, no IRF, subretinal fluid, vitreomacular adhesion (Shallow sliver of SRF IN to disc caught on widefield, bullous schisis cavity IT periphery caught on widefield -- not imaged today).   Notes  *Images captured and stored on drive  Diagnosis / Impression:  OD: Very poor image, retina attached, atrophic OS: Shallow sliver of SRF IN to disc caught on widefield, bullous schisis cavity IT periphery caught on widefield -- not imaged today  Clinical management:  See below  Abbreviations: NFP - Normal foveal profile. CME - cystoid macular edema. PED - pigment epithelial detachment. IRF - intraretinal fluid. SRF - subretinal fluid. EZ - ellipsoid zone. ERM - epiretinal membrane. ORA - outer retinal atrophy. ORT -  outer retinal tubulation. SRHM - subretinal hyper-reflective  material            ASSESSMENT/PLAN:    ICD-10-CM   1. Ocular inflammation  H57.89     2. 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 Traction detachment of right retina  H33.41 OCT, Retina - OU - Both Eyes    4. 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 Essential hypertension  I10     8. Hypertensive retinopathy of both eyes  H35.033     9. Combined forms of age-related cataract of both eyes  H25.813     10. Pseudophakia  Z96.1     11. Retained lens matter of right eye  H59.021      1-3. Ocular inflammation and PVR retinal detachment OD - 10.16.24 pt started seeing new floaters and "sparks" in vision - pt developed progressively worsening vision and vit haze - VA OD decreased to LP despite aggressive anti-inflammatory medical therapy - pt was on PredForte q1h OD, Prolensa  QID OD, and 40 mg po Prednisone  daily - b-scan 12.04.24 shows significant mobile vit opacities, ?RD - s/p PPV/PFO/EL/FAX/SO OD, 12.05.2024  - undiluted vit samples obtained intra-op for gram stain and culture -- no organisms, but +WBCs - intra-op - clearance of vitreous haze showed extensive fibrosis and PVR tractional detachment -- silicon oil added at end of case - AC was shallow with angle closed -- but now deep s/p CEIOL - exam showed residual intraretinal fibrosis and persistent SRF - s/p POW1 s/p PPV/lensectomy/membrane peel/relaxing retinotomy/silicon oil exchange OD, 05.15.2025             - doing well - lens material gone; good oil fill             - IOP okay at 13             - cont   PF 6x/day OD                          zymaxid  QID OD  -- stop on Sunday                         ketorolac  QID OD   Atropine  BID OD -- stop now                         PSO ung QID OD               - cont face down positioning 30 min/hr; avoid laying flat on back              - eye shield when sleeping x1 more wk             - post op drop and positioning instructions reviewed              - tylenol /ibuprofen  for pain  - f/u 2-3 wks  4,5. CSCR OD -- chronic  - pt reports decreased vision OD since December 2019  - reports significant stressors in life -- work  - denies steroid use - FA (09.30.20) shows focal hyperfluorescent leakage points nasal macula OD -- expansile dot phenotype? -  started 50 mg po eplerenone  since 09.30.20  -- pt self d/c in February, re-started in June 2021 - s/p IVA OD #1 (09.01.21), #2 (09.29.21), #3 (11.03.21), #4 (12.03.21) -- IVA resistance - s/p IVE OD #1 (01.20.22), #2 (02.18.22), #3 (04.05.22-sample), #4 (05.23.22), # 5 (06.27.22), #6 (07.28.22), #7 (08.26.22), #8 (09.23.22), #9 (10.21.22), #10 (11.18.22), #11 (12.16.22), #12 (01.20.23), #13 (03.02.23), #14 (04.07.23), #15 (05.12.23), #16 (06.09.23), #17 (07.14.23), #18 (08.08.23), #19 (09.08.23), #20 (10.06.23), #21 (11.06.23), #22 (12.04.23), #23 (01.08.24), #24 (02.12.24), #25 (03.18.24), #26 (04.22.24), #27 (05.20.24) -- IVE resistance ======================================================== - s/p IVV OD #1 (07.02.24), #2 (07.30.24), #3 (08.27.24), #4 (09.26.24), #5 (10.24.24)  - differential includes atypical exudative ARMD - IVV informed consent obtained and signed, 07.02.24  - stop po eplerenone  50 mg daily for now  - monitor for now  6. Retinoschisis OU (OD > OS)  - bullous retinoschisis cavity IT quadrant OD-- 4098-1191 oclock  - bullous retinoschisis cavity IT quadrant OS -- 4782-9562 oclock  - no associated retinal holes/RT/RD on scleral depression   - widefield [employer] imaging (04.22.24) shows mild interval progression of schisis cavities' size and posterior extension in comparison to baseline images  - s/p laser retinopexy OS 08.12.24 -- good laser changes surrounding - s/p laser  retinopexy OD (10.07.24) - new schisis cavity found on examination OD (10.28.24) - 1030 periphery -- no RT/RD on scleral depressed exam - s/p laser retinopexy OD to new schisis at 1030 on 10.28.24 - monitor  7,8. Hypertensive retinopathy OU  - discussed importance of tight BP control  - monitor  9. Mixed Cataract OS - The symptoms of cataract, surgical options, and treatments and risks were discussed with patient. - discussed diagnosis and progression - not yet visually significant - monitor for now  10,11. Pseudophakia OD  - s/p CE/IOL OD (Dr. Marvin Slot, 04.04.25)  - complicated PC rupture and retained lens material in vitreous  - 3-piece IOL with mild nasal displacement but appears stably scarred in place  - monitor   Ophthalmic Meds Ordered this visit:  Meds ordered this encounter  Medications   prednisoLONE  acetate (PRED FORTE ) 1 % ophthalmic suspension    Sig: Place 1 drop into the right eye 6 (six) times daily.    Dispense:  15 mL    Refill:  0   bacitracin -polymyxin b  (POLYSPORIN ) ophthalmic ointment    Sig: Place into the right eye 4 (four) times daily.    Dispense:  3.5 g    Refill:  5   ketorolac  (ACULAR ) 0.5 % ophthalmic solution    Sig: Place 1 drop into the left eye 4 (four) times daily.    Dispense:  10 mL    Refill:  0     Return for 2-3 wks - POV OD.  There are no Patient Instructions on file for this visit.  This document serves as a record of services personally performed by Jeanice Millard, MD, PhD. It was created on their behalf by Diona Franklin, COMT. The creation of this record is the provider's dictation and/or activities during the visit.  Electronically signed by: Diona Franklin, COMT 03/01/24 7:17 PM  This document serves as a record of services personally performed by Jeanice Millard, MD, PhD. It was created on their behalf by Morley Arabia. Bevin Bucks, OA an ophthalmic technician. The creation of this record is the provider's dictation and/or activities  during the visit.    Electronically signed by: Morley Arabia. Bevin Bucks, OA 03/01/24 7:17 PM   Aqueelah Cotrell G. Misheel Gowans,  M.D., Ph.D. Diseases & Surgery of the Retina and Vitreous [facility]  I have reviewed the above documentation for accuracy and completeness, and I agree with the above. Jeanice Millard, M.D., Ph.D. 03/01/24 7:21 PM   Abbreviations: M myopia (nearsighted); A astigmatism; H hyperopia (farsighted); P presbyopia; Mrx spectacle prescription;  CTL contact lenses; OD right eye; OS left eye; OU both eyes  XT exotropia; ET esotropia; PEK punctate epithelial keratitis; PEE punctate epithelial erosions; DES dry eye syndrome; MGD meibomian gland dysfunction; ATs artificial tears; PFAT's preservative free artificial tears; NSC nuclear sclerotic cataract; PSC posterior subcapsular cataract; ERM epi-retinal membrane; PVD posterior vitreous detachment; RD retinal detachment; DM diabetes mellitus; DR diabetic retinopathy; NPDR non-proliferative diabetic retinopathy; PDR proliferative diabetic retinopathy; CSME clinically significant macular edema; DME diabetic macular edema; dbh dot blot hemorrhages; CWS cotton wool spot; POAG primary open angle glaucoma; C/D cup-to-disc ratio; HVF humphrey visual field; GVF goldmann visual field; OCT optical coherence tomography; IOP intraocular pressure; BRVO Branch retinal vein occlusion; CRVO central retinal vein occlusion; CRAO central retinal artery occlusion; BRAO branch retinal artery occlusion; RT retinal tear; SB scleral buckle; PPV pars plana vitrectomy; VH Vitreous hemorrhage; PRP panretinal laser photocoagulation; IVK intravitreal kenalog ; VMT vitreomacular traction; MH Macular hole;  NVD neovascularization of the disc; NVE neovascularization elsewhere; AREDS age related eye disease study; ARMD age related macular degeneration; POAG primary open angle glaucoma; EBMD epithelial/anterior basement membrane dystrophy; ACIOL anterior chamber intraocular  lens; IOL intraocular lens; PCIOL posterior chamber intraocular lens; Phaco/IOL phacoemulsification with intraocular lens placement; PRK photorefractive keratectomy; LASIK laser assisted in situ keratomileusis; HTN hypertension; DM diabetes mellitus; COPD chronic obstructive pulmonary disease

## 2024-03-01 ENCOUNTER — Ambulatory Visit (INDEPENDENT_AMBULATORY_CARE_PROVIDER_SITE_OTHER): Admitting: Ophthalmology

## 2024-03-01 ENCOUNTER — Encounter (INDEPENDENT_AMBULATORY_CARE_PROVIDER_SITE_OTHER): Payer: Self-pay | Admitting: Ophthalmology

## 2024-03-01 DIAGNOSIS — H353211 Exudative age-related macular degeneration, right eye, with active choroidal neovascularization: Secondary | ICD-10-CM

## 2024-03-01 DIAGNOSIS — I10 Essential (primary) hypertension: Secondary | ICD-10-CM

## 2024-03-01 DIAGNOSIS — H3521 Other non-diabetic proliferative retinopathy, right eye: Secondary | ICD-10-CM | POA: Diagnosis not present

## 2024-03-01 DIAGNOSIS — H35033 Hypertensive retinopathy, bilateral: Secondary | ICD-10-CM

## 2024-03-01 DIAGNOSIS — H5789 Other specified disorders of eye and adnexa: Secondary | ICD-10-CM

## 2024-03-01 DIAGNOSIS — H33103 Unspecified retinoschisis, bilateral: Secondary | ICD-10-CM

## 2024-03-01 DIAGNOSIS — H35711 Central serous chorioretinopathy, right eye: Secondary | ICD-10-CM

## 2024-03-01 DIAGNOSIS — H59021 Cataract (lens) fragments in eye following cataract surgery, right eye: Secondary | ICD-10-CM

## 2024-03-01 DIAGNOSIS — H25813 Combined forms of age-related cataract, bilateral: Secondary | ICD-10-CM

## 2024-03-01 DIAGNOSIS — Z961 Presence of intraocular lens: Secondary | ICD-10-CM

## 2024-03-01 DIAGNOSIS — H3341 Traction detachment of retina, right eye: Secondary | ICD-10-CM

## 2024-03-01 MED ORDER — PREDNISOLONE ACETATE 1 % OP SUSP: 1.0000 [drp] | Freq: Every day | OPHTHALMIC | 0 refills | Status: DC

## 2024-03-01 MED ORDER — BACITRACIN-POLYMYXIN B 500-10000 UNIT/GM OP OINT: TOPICAL_OINTMENT | Freq: Four times a day (QID) | OPHTHALMIC | 5 refills | Status: DC

## 2024-03-01 MED ORDER — KETOROLAC TROMETHAMINE 0.5 % OP SOLN: 1.0000 [drp] | Freq: Four times a day (QID) | OPHTHALMIC | 0 refills | Status: DC

## 2024-03-08 NOTE — Progress Notes (Signed)
 [facility] - Clinic Note  03/15/2024     CHIEF COMPLAINT Patient presents for Retina Follow Up   HISTORY OF PRESENT ILLNESS: [name] is a 53 y.o. female who presents to the clinic today for:  HPI     Retina Follow Up   Patient presents with  Retinal Break/Detachment.  In right eye.  This started 2 weeks ago.  Duration of 2 weeks.  Since onset it is stable.  I, the attending physician,  performed the HPI with the patient and updated documentation appropriately.        Comments   2 week retina follow up RD OD pt is reporting that she is still not really seeing she denies any flashes or floaters she is using PF 6 x per day  ketorolac  QID  PSO ung QID OD        Last edited by Ronelle Coffee, MD on 03/23/2024  1:56 AM.     Pt states she is feeling a lot of sharp pain in her eye, she is using the ointment to try and help, she states the ketorolac  stings when she uses it, vision is the same  Referring physician: Gabino Joe, PA-C [address] [place],  [place] [address]  HISTORICAL INFORMATION:   Selected notes from the MEDICAL RECORD NUMBER Referred by Dr. Cleone Dad for concern of sub-macular fluid / retinoschisis   CURRENT MEDICATIONS: Current Outpatient Medications (Ophthalmic Drugs)  Medication Sig   bacitracin -polymyxin b  (POLYSPORIN ) ophthalmic ointment Place into the right eye 4 (four) times daily.   Brinzolamide-Brimonidine  (SIMBRINZA) 1-0.2 % SUSP Place 1 drop into the right eye in the morning, at noon, and at bedtime.   Bromfenac  Sodium 0.09 % SOLN INSTILL 1 DROP IN THE MORNING, AT NOON, IN THE EVENING, AND AT BEDTIME. 1 DROP INTO RIGHT EYE 4 TIMES DAILY   ketorolac  (ACULAR ) 0.5 % ophthalmic solution Place 1 drop into the left eye 4 (four) times daily.   prednisoLONE  acetate (PRED FORTE ) 1 % ophthalmic suspension Place 1 drop into the right eye 6 (six) times daily.   No current facility-administered  medications for this visit. (Ophthalmic Drugs)   Current Outpatient Medications (Other)  Medication Sig   acetaminophen  (TYLENOL ) 500 MG tablet Take 500-1,000 mg by mouth every 6 (six) hours as needed (pain.).   Estradiol  (VAGIFEM ) 10 MCG TABS vaginal tablet Place 1 tablet (10 mcg total) vaginally 2 (two) times a week. Use every night before bed for two weeks when you first begin this medicine, then after the first two weeks, begin using it twice a week.   hydrochlorothiazide (HYDRODIURIL) 25 MG tablet Take 25 mg by mouth daily.   ibuprofen  (ADVIL ) 200 MG tablet Take 200-400 mg by mouth every 8 (eight) hours as needed (pain.).   lisinopril  (PRINIVIL ,ZESTRIL ) 20 MG tablet Take 1 tablet (20 mg total) by mouth daily.   nitrofurantoin , macrocrystal-monohydrate, (MACROBID ) 100 MG capsule Take 1 capsule by mouth 2 (two) times daily. (Patient not taking: Reported on 03/12/2024)   predniSONE  (DELTASONE ) 20 MG tablet TAKE 2 TABLETS BY MOUTH ONCE DAILY WITH BREAKFAST (Patient not taking: Reported on 03/12/2024)   traMADol  (ULTRAM ) 50 MG tablet TAKE 1 TABLET BY MOUTH EVERY 6 HOURS AS NEEDED FOR SEVER PAIN. FOR AFTER SURGERY, DO NOT TAKE AND DRIVE (Patient not taking: Reported on 03/12/2024)   No current facility-administered medications for this visit. (Other)   REVIEW OF SYSTEMS: ROS   Positive for: Cardiovascular, Eyes  Negative for: Constitutional, Gastrointestinal, Neurological, Skin, Genitourinary, Musculoskeletal, HENT, Endocrine, Respiratory, Psychiatric, Allergic/Imm, Heme/Lymph Last edited by Alise Appl, COT on 03/15/2024  9:14 AM.      ALLERGIES Allergies  Allergen Reactions   Pollen Extract Itching    POLLEN EXTRACTS    PAST MEDICAL HISTORY Past Medical History:  Diagnosis Date   Cervical dysplasia    CIN I (cervical intraepithelial neoplasia I)    High risk HPV infection    History of abnormal cervical Pap smear    12-28-21 lgsil HPV HR +   History of adenomatous polyp of  colon    Hypertension    Hypertensive retinopathy    OU   Macular degeneration, right eye    Retinal defect, 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   followed by dr Karyl Paget;  pt takes  inspra  for excess fluid in retina,   Sciatic nerve injury    Seasonal allergies    Vaginal dysplasia    Wears glasses    Past Surgical History:  Procedure Laterality Date   CERVICAL CONIZATION W/BX N/A 06/30/2023   Procedure: CONIZATION CERVIX WITH BIOPSY, ENDOCERVICAL CURETTAGE;  Surgeon: Suzi Essex, MD;  Location: [facility];  Service: Gynecology;  Laterality: N/A;   CO2 LASER APPLICATION N/A 06/30/2023   Procedure: CO2 LASER TO THE VAGINA;  Surgeon: Suzi Essex, MD;  Location: [facility];  Service: Gynecology;  Laterality: N/A;   COLONOSCOPY  12/14/2022   HYSTEROSCOPY WITH RESECTOSCOPE  11/25/2006   @[facility]  by dr Kirk Peper. [name];   Resectoscopic myomectomy and polypectomy   INJECTION OF SILICONE OIL Right 09/15/2023   Procedure: INJECTION OF SILICONE OIL;  Surgeon: Ronelle Coffee, MD;  Location: [facility] OR;  Service: Ophthalmology;  Laterality: Right;   INJECTION OF SILICONE OIL Right 02/23/2024   Procedure: INJECTION, SILICONE OIL EXCHANGE;  Surgeon: Ronelle Coffee, MD;  Location: [facility] OR;  Service: Ophthalmology;  Laterality: Right;   PARS PLANA VITRECTOMY Right 09/15/2023   Procedure: PARS PLANA VITRECTOMY WITH 25 GAUGE;  Surgeon: Ronelle Coffee, MD;  Location: [facility] OR;  Service: Ophthalmology;  Laterality: Right;   PARS PLANA VITRECTOMY W/ FOCAL ENDOLASER PHOTOCOAGULATION Right 02/23/2024   Procedure: 25 GAUGE PARS PLANA VITRECTOMY  WITH LENSECTOMY, MEMBRANE PEEL, AND ENDOLASER;  Surgeon: Ronelle Coffee, MD;  Location: [facility] OR;  Service: Ophthalmology;  Laterality: Right;   REMOVAL RETAINED LENS Right 02/23/2024   Procedure: REMOVAL, RETAINED LENS MATTER;  Surgeon: Ronelle Coffee, MD;  Location: [facility] OR;  Service: Ophthalmology;  Laterality: Right;   REPAIR OF COMPLEX TRACTION RETINAL DETACHMENT  Right 02/23/2024   Procedure: REPAIR, RETINAL DETACHMENT, COMPLEX;  Surgeon: Ronelle Coffee, MD;  Location: [facility] OR;  Service: Ophthalmology;  Laterality: Right;   VAGINECTOMY, PARTIAL N/A 06/30/2023   Procedure: SMALL VAGINECTOMY, PARTIAL BIOSPIES;  Surgeon: Suzi Essex, MD;  Location: [facility];  Service: Gynecology;  Laterality: N/A;   FAMILY HISTORY Family History  Problem Relation Age of Onset   Hypertension Mother    Diabetes Father    Diabetes Maternal Grandmother    Hypertension Maternal Grandmother    Colon cancer Neg Hx    Colon polyps Neg Hx    Esophageal cancer Neg Hx    Rectal cancer Neg Hx    Stomach cancer Neg Hx    SOCIAL HISTORY Social History   Tobacco Use   Smoking status: Never   Smokeless tobacco: Never  Vaping Use   Vaping status: Never Used  Substance Use Topics   Alcohol use: No  Alcohol/week: 0.0 standard drinks of alcohol   Drug use: Never       OPHTHALMIC EXAM: Base Eye Exam     Visual Acuity (Snellen - Linear)       Right Left   Dist cc LP 20/25   Dist ph cc NI NI         Tonometry (Tonopen, 9:18 AM)       Right Left   Pressure 14 14         Pupils       Pupils Dark Light Shape React APD   Right PERRL 5 5 Round     Left PERRL 3 2 Round Brisk None         Visual Fields       Left Right    Full    Restrictions  Total superior temporal, inferior temporal, superior nasal, inferior nasal deficiencies         Extraocular Movement       Right Left    Full, Ortho Full, Ortho         Neuro/Psych     Oriented x3: Yes   Mood/Affect: Normal         Dilation     Right eye: 2.5% Phenylephrine  @ 9:18 AM           Slit Lamp and Fundus Exam     Slit Lamp Exam       Right Left   Lids/Lashes Dermatochalasis - upper lid Dermatochalasis - upper lid   Conjunctiva/Sclera White and quiet temporally, nasal pinguecula 1+ Injection, nasal Pinguecula   Cornea Nasal ptergyium extending 2.88mm  onto nasal cornea -- less inflamed, peripheral haze, well healed cataract wound, 1+ Punctate epithelial erosions, 1+ Guttata 1+Punctate epithelial erosions   Anterior Chamber Deep, silicone oil bubbles deep, narrow temporal angle, 1+pigment   Iris Irregular dilation, +PS temporally Round and dilated   Lens 3 piece sulcus IOL with mild nasal displacement and open PC 2+ Nuclear sclerosis, 2+ Cortical cataract   Anterior Vitreous post vitrectomy, good silicone oil fill, lens remnant gone Mild Vitreous syneresis         Fundus Exam       Right Left   Disc hazy view, 3+pallor, Sharp rim    C/D Ratio 0.4 0.6   Macula hazy view, flat under oil    Vessels Attenuated, mild tortuosity; fibrosis improved    Periphery 360 laser, flat under oil, relaxing retinotomy temporally, dense fibrosis temporal periphery improved            Refraction     Wearing Rx       Sphere Cylinder Axis   Right +1.25 +1.25 112   Left +2.25 +1.00 101           IMAGING AND PROCEDURES  Imaging and Procedures for @TODAY @  OCT, Retina - OU - Both Eyes       Right Eye Quality was poor. Progression has been stable. Findings include no IRF, no SRF, abnormal foveal contour, subretinal hyper-reflective material, epiretinal membrane, pigment epithelial detachment, outer retinal atrophy (Very poor image, retina attached, atrophic).   Left Eye Quality was good. Central Foveal Thickness: 298. Progression has been stable. Findings include normal foveal contour, no IRF, subretinal fluid, vitreomacular adhesion (Shallow sliver of SRF IN to disc caught on widefield, bullous schisis cavity IT periphery caught on widefield -- not imaged today).   Notes *Images captured and stored on drive  Diagnosis / Impression:  OD: Very poor image,  retina attached, atrophic OS: Shallow sliver of SRF IN to disc caught on widefield, bullous schisis cavity IT periphery caught on widefield -- not imaged today  Clinical management:   See below  Abbreviations: NFP - Normal foveal profile. CME - cystoid macular edema. PED - pigment epithelial detachment. IRF - intraretinal fluid. SRF - subretinal fluid. EZ - ellipsoid zone. ERM - epiretinal membrane. ORA - outer retinal atrophy. ORT - outer retinal tubulation. SRHM - subretinal hyper-reflective material            ASSESSMENT/PLAN:    ICD-10-CM   1. Ocular inflammation  H57.89     2. 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 Traction detachment of right retina  H33.41     4. 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 Essential hypertension  I10     8. Hypertensive retinopathy of both eyes  H35.033     9. Combined forms of age-related cataract of both eyes  H25.813     10. Pseudophakia  Z96.1     11. Retained lens matter of right eye  H59.021      1-3. Ocular inflammation and PVR retinal detachment OD - 10.16.24 pt started seeing new floaters and sparks in vision - pt developed progressively worsening vision and vit haze - VA OD decreased to LP despite aggressive anti-inflammatory medical therapy - pt was on PredForte q1h OD, Prolensa  QID OD, and 40 mg po Prednisone  daily - b-scan 12.04.24 shows significant mobile vit opacities, ?RD - s/p PPV/PFO/EL/FAX/SO OD, 12.05.2024  - undiluted vit samples obtained intra-op for gram stain and culture -- no organisms, but +WBCs - intra-op - clearance of vitreous haze showed extensive fibrosis and PVR tractional detachment -- silicon oil added at end of case - AC was shallow with angle closed -- but now deep s/p CEIOL - exam showed residual intraretinal fibrosis and persistent SRF - s/p PPV/lensectomy/membrane peel/relaxing retinotomy/silicon oil exchange OD, 05.15.2025             - doing  well - lens material gone; good oil fill             - IOP okay at 13             - cont   PF 6x/day OD -- decrease to QID                         ketorolac  QID OD                         PSO ung QID OD -- decrease to bedtime / PRN             - cont face down positioning 30 min/hr; avoid laying flat on back              - eye shield when sleeping x1 more wk             - post op drop and positioning instructions reviewed              - tylenol /ibuprofen  for pain   - pt can return to work on Monday, March 19, 2024 -- no straining, no lifting  more than 10-15lbs, no pushing or pulling - f/u 4 wks  4,5. CSCR OD -- chronic  - pt reports decreased vision OD since December 2019  - reports significant stressors in life -- work  - denies steroid use - FA (09.30.20) shows focal hyperfluorescent leakage points nasal macula OD -- expansile dot phenotype? - started 50 mg po eplerenone  since 09.30.20  -- pt self d/c in February, re-started in June 2021 - s/p IVA OD #1 (09.01.21), #2 (09.29.21), #3 (11.03.21), #4 (12.03.21) -- IVA resistance - s/p IVE OD #1 (01.20.22), #2 (02.18.22), #3 (04.05.22-sample), #4 (05.23.22), # 5 (06.27.22), #6 (07.28.22), #7 (08.26.22), #8 (09.23.22), #9 (10.21.22), #10 (11.18.22), #11 (12.16.22), #12 (01.20.23), #13 (03.02.23), #14 (04.07.23), #15 (05.12.23), #16 (06.09.23), #17 (07.14.23), #18 (08.08.23), #19 (09.08.23), #20 (10.06.23), #21 (11.06.23), #22 (12.04.23), #23 (01.08.24), #24 (02.12.24), #25 (03.18.24), #26 (04.22.24), #27 (05.20.24) -- IVE resistance ======================================================== - s/p IVV OD #1 (07.02.24), #2 (07.30.24), #3 (08.27.24), #4 (09.26.24), #5 (10.24.24)  - differential includes atypical exudative ARMD - IVV informed consent obtained and signed, 07.02.24  - stop po eplerenone  50 mg daily for now  - monitor for now  6. Retinoschisis OU (OD > OS)  - bullous retinoschisis cavity IT quadrant OD-- 1610-9604 oclock  - bullous  retinoschisis cavity IT quadrant OS -- 5409-8119 oclock  - no associated retinal holes/RT/RD on scleral depression   - widefield Optos imaging (04.22.24) shows mild interval progression of schisis cavities' size and posterior extension in comparison to baseline images  - s/p laser retinopexy OS 08.12.24 -- good laser changes surrounding - s/p laser retinopexy OD (10.07.24) - new schisis cavity found on examination OD (10.28.24) - 1030 periphery -- no RT/RD on scleral depressed exam - s/p laser retinopexy OD to new schisis at 1030 on 10.28.24 - monitor  7,8. Hypertensive retinopathy OU  - discussed importance of tight BP control  - monitor  9. Mixed Cataract OS - The symptoms of cataract, surgical options, and treatments and risks were discussed with patient. - discussed diagnosis and progression - not yet visually significant - monitor for now  10,11. Pseudophakia OD  - s/p CE/IOL OD (Dr. Marvin Slot, 04.04.25)  - complicated PC rupture and retained lens material in vitreous  - 3-piece IOL with mild nasal displacement but appears stably scarred in place  - monitor   Ophthalmic Meds Ordered this visit:  No orders of the defined types were placed in this encounter.    Return in about 4 weeks (around 04/12/2024) for POV - s/p PPV/lensectomy/membrane peel/relaxing retinotomy/silicon oil exchange OD, 05.15.2025.  There are no Patient Instructions on file for this visit.  This document serves as a record of services personally performed by Jeanice Millard, MD, PhD. It was created on their behalf by Morley Arabia. Bevin Bucks, OA an ophthalmic technician. The creation of this record is the provider's dictation and/or activities during the visit.    Electronically signed by: Morley Arabia. Bevin Bucks, OA 03/23/24 4:38 PM  Jeanice Millard, M.D., Ph.D. Diseases & Surgery of the Retina and Vitreous [facility]  I have reviewed the above documentation for accuracy and completeness, and I  agree with the above. Jeanice Millard, M.D., Ph.D. 03/23/24 4:38 PM   Abbreviations: M myopia (nearsighted); A astigmatism; H hyperopia (farsighted); P presbyopia; Mrx spectacle prescription;  CTL contact lenses; OD right eye; OS left eye; OU both eyes  XT exotropia; ET esotropia; PEK punctate epithelial keratitis; PEE punctate epithelial erosions; DES dry eye syndrome;  MGD meibomian gland dysfunction; ATs artificial tears; PFAT's preservative free artificial tears; NSC nuclear sclerotic cataract; PSC posterior subcapsular cataract; ERM epi-retinal membrane; PVD posterior vitreous detachment; RD retinal detachment; DM diabetes mellitus; DR diabetic retinopathy; NPDR non-proliferative diabetic retinopathy; PDR proliferative diabetic retinopathy; CSME clinically significant macular edema; DME diabetic macular edema; dbh dot blot hemorrhages; CWS cotton wool spot; POAG primary open angle glaucoma; C/D cup-to-disc ratio; HVF humphrey visual field; GVF goldmann visual field; OCT optical coherence tomography; IOP intraocular pressure; BRVO Branch retinal vein occlusion; CRVO central retinal vein occlusion; CRAO central retinal artery occlusion; BRAO branch retinal artery occlusion; RT retinal tear; SB scleral buckle; PPV pars plana vitrectomy; VH Vitreous hemorrhage; PRP panretinal laser photocoagulation; IVK intravitreal kenalog ; VMT vitreomacular traction; MH Macular hole;  NVD neovascularization of the disc; NVE neovascularization elsewhere; AREDS age related eye disease study; ARMD age related macular degeneration; POAG primary open angle glaucoma; EBMD epithelial/anterior basement membrane dystrophy; ACIOL anterior chamber intraocular lens; IOL intraocular lens; PCIOL posterior chamber intraocular lens; Phaco/IOL phacoemulsification with intraocular lens placement; PRK photorefractive keratectomy; LASIK laser assisted in situ keratomileusis; HTN hypertension; DM diabetes mellitus; COPD chronic obstructive  pulmonary disease

## 2024-03-09 NOTE — Progress Notes (Signed)
 52 y.o. [id number] Single Hispanic female here for annual exam.  Interpretor present today.   Denies vaginal bleeding or pain.   Has vaginal dryness.   Has hot flashes which are improving.   Not seeking treatment.   PCP: Gabino Joe, PA-C   Patient's last menstrual period was 02/18/2022.           Sexually active: No.  The current method of family planning is condoms.    Menopausal hormone therapy:  n/a Exercising: No.    Smoker:  no  OB History  Gravida Para Term Preterm AB Living  1 0   1 0  SAB IAB Ectopic Multiple Live Births  1        # Outcome Date GA Lbr Len/2nd Weight Sex Type Anes PTL Lv  1 SAB              HEALTH MAINTENANCE: Last 2 paps:  03/11/23 LSIL, HR HPV +, 12/28/21 LSIL, HR HPV + History of abnormal Pap or positive HPV:  yes Mammogram:   02/03/24 Breast density Cat B< BIRADS Cat 1 neg  Colonoscopy:  12/14/22 Bone Density:  n/a  Result  n/a   Immunization History  Administered Date(s) Administered   Influenza,inj,Quad PF,6+ Mos 06/24/2014, 06/27/2015   Tdap 06/16/2012      reports that she has never smoked. She has never used smokeless tobacco. She reports that she does not drink alcohol and does not use drugs.  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   followed by dr zamora;  pt takes  inspra  for excess fluid in retina,   Sciatic nerve injury    Seasonal allergies    Vaginal dysplasia    Wears glasses     Past Surgical History:  Procedure Laterality Date   CERVICAL CONIZATION W/BX N/A 06/30/2023   Procedure: CONIZATION CERVIX WITH BIOPSY, ENDOCERVICAL CURETTAGE;  Surgeon: Suzi Essex, MD;  Location:  [facility] ;  Service: Gynecology;  Laterality: N/A;   CO2 LASER APPLICATION N/A 06/30/2023   Procedure: CO2 LASER TO THE VAGINA;  Surgeon: Suzi Essex, MD;  Location: [facility];  Service: Gynecology;  Laterality: N/A;   COLONOSCOPY  12/14/2022   HYSTEROSCOPY WITH RESECTOSCOPE  11/25/2006   @[facility]  by dr Kirk Peper. [name];   Resectoscopic myomectomy and polypectomy   INJECTION OF SILICONE OIL Right 09/15/2023   Procedure: INJECTION OF SILICONE OIL;  Surgeon: Ronelle Coffee, MD;  Location: [facility] OR;  Service: Ophthalmology;  Laterality: Right;   INJECTION OF SILICONE OIL Right 02/23/2024   Procedure: INJECTION, SILICONE OIL EXCHANGE;  Surgeon: Ronelle Coffee, MD;  Location: [facility] OR;  Service: Ophthalmology;  Laterality: Right;   PARS PLANA VITRECTOMY Right 09/15/2023   Procedure: PARS PLANA VITRECTOMY WITH 25 GAUGE;  Surgeon: Ronelle Coffee, MD;  Location: [facility] OR;  Service: Ophthalmology;  Laterality: Right;   PARS PLANA VITRECTOMY W/ FOCAL ENDOLASER PHOTOCOAGULATION Right 02/23/2024   Procedure: 25 GAUGE PARS PLANA VITRECTOMY  WITH LENSECTOMY, MEMBRANE PEEL, AND ENDOLASER;  Surgeon: Ronelle Coffee, MD;  Location: [facility] OR;  Service: Ophthalmology;  Laterality: Right;   REMOVAL RETAINED LENS Right 02/23/2024   Procedure: REMOVAL, RETAINED LENS MATTER;  Surgeon: Ronelle Coffee, MD;  Location[facility] OR;  Service: Ophthalmology;  Laterality: Right;   REPAIR OF COMPLEX TRACTION RETINAL DETACHMENT Right 02/23/2024   Procedure: REPAIR, RETINAL DETACHMENT, COMPLEX;  Surgeon: Ronelle Coffee, MD;  Location: [facility] OR;  Service: Ophthalmology;  Laterality: Right;   VAGINECTOMY, PARTIAL N/A 06/30/2023   Procedure: SMALL VAGINECTOMY, PARTIAL BIOSPIES;  Surgeon: Suzi Essex, MD;  Location: [facility];  Service: Gynecology;  Laterality: N/A;    Current Outpatient Medications  Medication Sig Dispense Refill   acetaminophen  (TYLENOL ) 500 MG tablet Take 500-1,000 mg by mouth every 6  (six) hours as needed (pain.).     bacitracin -polymyxin b  (POLYSPORIN ) ophthalmic ointment Place into the right eye 4 (four) times daily. 3.5 g 5   Brinzolamide-Brimonidine  (SIMBRINZA) 1-0.2 % SUSP Place 1 drop into the right eye in the morning, at noon, and at bedtime.     Bromfenac  Sodium 0.09 % SOLN INSTILL 1 DROP IN THE MORNING, AT NOON, IN THE EVENING, AND AT BEDTIME. 1 DROP INTO RIGHT EYE 4 TIMES DAILY     hydrochlorothiazide (HYDRODIURIL) 25 MG tablet Take 25 mg by mouth daily.     ibuprofen  (ADVIL ) 200 MG tablet Take 200-400 mg by mouth every 8 (eight) hours as needed (pain.).     ketorolac  (ACULAR ) 0.5 % ophthalmic solution Place 1 drop into the left eye 4 (four) times daily. 10 mL 0   lisinopril  (PRINIVIL ,ZESTRIL ) 20 MG tablet Take 1 tablet (20 mg total) by mouth daily. 30 tablet 12   prednisoLONE  acetate (PRED FORTE ) 1 % ophthalmic suspension Place 1 drop into the right eye 6 (six) times daily. 15 mL 0   nitrofurantoin , macrocrystal-monohydrate, (MACROBID ) 100 MG capsule Take 1 capsule by mouth 2 (two) times daily. (Patient not taking: Reported on 03/12/2024)     predniSONE  (DELTASONE ) 20 MG tablet TAKE 2 TABLETS BY MOUTH ONCE DAILY WITH BREAKFAST (Patient not taking: Reported on 03/12/2024)     traMADol  (ULTRAM ) 50 MG tablet TAKE 1 TABLET BY MOUTH EVERY 6 HOURS AS NEEDED FOR SEVER PAIN. FOR AFTER SURGERY, DO NOT TAKE AND DRIVE (Patient not taking: Reported on 03/12/2024)     No current facility-administered medications for this visit.    ALLERGIES: Pollen extract  Family History  Problem Relation Age of Onset   Hypertension Mother    Diabetes Father    Diabetes Maternal Grandmother    Hypertension Maternal Grandmother    Colon cancer Neg Hx    Colon polyps Neg Hx    Esophageal cancer Neg Hx    Rectal cancer Neg Hx    Stomach cancer Neg Hx     Review of Systems  All other systems reviewed and are negative.   PHYSICAL EXAM:  BP 128/84 (BP Location: Right Arm, Patient Position:  Sitting)   Pulse (!) 101   Ht 5\' 5"  (1.651 m)   Wt 159 lb (72.1 kg)   LMP 02/18/2022 Comment: sexually active, condoms  SpO2 97%   BMI 26.46 kg/m     General appearance: alert, cooperative and appears stated age Head: normocephalic, without obvious abnormality, atraumatic Neck: no adenopathy, supple, symmetrical, trachea midline and thyroid  normal to inspection and palpation Lungs: clear to auscultation bilaterally Breasts: normal appearance, no masses or tenderness, No nipple retraction or dimpling, No nipple discharge or bleeding, No axillary adenopathy Heart: regular rate and rhythm Abdomen: soft, non-tender; no masses, no  organomegaly Extremities: extremities normal, atraumatic, no cyanosis or edema Skin: skin color, texture, turgor normal. No rashes or lesions Lymph nodes: cervical, supraclavicular, and axillary nodes normal. Neurologic: grossly normal  Pelvic: External genitalia:  no lesions              No abnormal inguinal nodes palpated.              Urethra:  normal appearing urethra with no masses, tenderness or lesions              Bartholins and Skenes: normal                 Vagina: normal appearing vagina with normal color and discharge, small 3 x 5 mm soft tissue right vaginal sidewall - appearance consistent with granulation tissue - removed with Q-tip and sent to pathology.               Cervix: no lesions              Pap taken: yes Bimanual Exam:  Uterus:  normal size, contour, position, consistency, mobility, non-tender              Adnexa: no mass, fullness, tenderness              Rectal exam: yes.  Confirms.              Anus:  normal sphincter tone, no lesions  Chaperone was present for exam:  Cottie Diss, CMA  ASSESSMENT: Well woman visit with gynecologic exam. Status post cervical conization 06/30/23 with Dr. Orvil Bland.  Final pathology:  LGSIL. Status post excision and laser of VAIN I 06/30/23 with Dr. Orvil Bland.  Vaginal lesion today.  Probable granulation  tissue.  Vaginal atrophy. PHQ-9: 0  PLAN: Mammogram screening discussed. Self breast awareness reviewed. Pap and HRV collected:  no.  Due in 08/2024. Guidelines for Calcium, Vitamin D , regular exercise program including cardiovascular and weight bearing exercise. Medication refills:  Vagifem .  We discussed effect on breast tissue.  Vaginal biopsy to pathology. Follow up:  6 months or pelvic exam and then 1 year for annual exam and anticipated cervical cancer screening.   Final plan to be determined after vaginal biopsy material and pap/Hr HPV test results from today are final.

## 2024-03-12 ENCOUNTER — Encounter: Payer: Self-pay | Admitting: Obstetrics and Gynecology

## 2024-03-12 ENCOUNTER — Ambulatory Visit (INDEPENDENT_AMBULATORY_CARE_PROVIDER_SITE_OTHER): Payer: Commercial Managed Care - PPO | Admitting: Obstetrics and Gynecology

## 2024-03-12 ENCOUNTER — Other Ambulatory Visit (HOSPITAL_COMMUNITY)
Admission: RE | Admit: 2024-03-12 | Discharge: 2024-03-12 | Disposition: A | Discharge status: Home / Self Care | Source: Ambulatory Visit | Attending: Obstetrics and Gynecology | Admitting: Obstetrics and Gynecology

## 2024-03-12 VITALS — BP 128/84 | HR 101 | Ht 65.0 in | Wt 159.0 lb

## 2024-03-12 DIAGNOSIS — Z87411 Personal history of vaginal dysplasia: Secondary | ICD-10-CM | POA: Insufficient documentation

## 2024-03-12 DIAGNOSIS — Z124 Encounter for screening for malignant neoplasm of cervix: Secondary | ICD-10-CM

## 2024-03-12 DIAGNOSIS — Z01419 Encounter for gynecological examination (general) (routine) without abnormal findings: Secondary | ICD-10-CM

## 2024-03-12 DIAGNOSIS — Z87410 Personal history of cervical dysplasia: Secondary | ICD-10-CM | POA: Diagnosis not present

## 2024-03-12 DIAGNOSIS — Z1331 Encounter for screening for depression: Secondary | ICD-10-CM | POA: Diagnosis not present

## 2024-03-12 DIAGNOSIS — N898 Other specified noninflammatory disorders of vagina: Secondary | ICD-10-CM | POA: Insufficient documentation

## 2024-03-12 MED ORDER — ESTRADIOL 10 MCG VA TABS: 1.0000 | ORAL_TABLET | VAGINAL | 3 refills | Status: DC

## 2024-03-12 NOTE — Patient Instructions (Addendum)
 Consider [place] Jelly, Astroglide, or cooking oil for vaginal dryness.   EXERCISE AND DIET:  We recommended that you start or continue a regular exercise program for good health. Regular exercise means any activity that makes your heart beat faster and makes you sweat.  We recommend exercising at least 30 minutes per day at least 3 days a week, preferably 4 or 5.  We also recommend a diet low in fat and sugar.  Inactivity, poor dietary choices and obesity can cause diabetes, heart attack, stroke, and kidney damage, among others.    ALCOHOL AND SMOKING:  Women should limit their alcohol intake to no more than 7 drinks/beers/glasses of wine (combined, not each!) per week. Moderation of alcohol intake to this level decreases your risk of breast cancer and liver damage. And of course, no recreational drugs are part of a healthy lifestyle.  And absolutely no smoking or even second hand smoke. Most people know smoking can cause heart and lung diseases, but did you know it also contributes to weakening of your bones? Aging of your skin?  Yellowing of your teeth and nails?  CALCIUM AND VITAMIN D :  Adequate intake of calcium and Vitamin D  are recommended.  The recommendations for exact amounts of these supplements seem to change often, but generally speaking 600 mg of calcium (either carbonate or citrate) and 800 units of Vitamin D  per day seems prudent. Certain women may benefit from higher intake of Vitamin D .  If you are among these women, your doctor will have told you during your visit.    PAP SMEARS:  Pap smears, to check for cervical cancer or precancers,  have traditionally been done yearly, although recent scientific advances have shown that most women can have pap smears less often.  However, every woman still should have a physical exam from her gynecologist every year. It will include a breast check, inspection of the vulva and vagina to check for abnormal growths or skin changes, a visual exam of the cervix,  and then an exam to evaluate the size and shape of the uterus and ovaries.  And after 55 years of age, a rectal exam is indicated to check for rectal cancers. We will also provide age appropriate advice regarding health maintenance, like when you should have certain vaccines, screening for sexually transmitted diseases, bone density testing, colonoscopy, mammograms, etc.   MAMMOGRAMS:  All women over 56 years old should have a yearly mammogram. Many facilities now offer a "3D" mammogram, which may cost around $50 extra out of pocket. If possible,  we recommend you accept the option to have the 3D mammogram performed.  It both reduces the number of women who will be called back for extra views which then turn out to be normal, and it is better than the routine mammogram at detecting truly abnormal areas.    COLONOSCOPY:  Colonoscopy to screen for colon cancer is recommended for all women at age 46.  We know, you hate the idea of the prep.  We agree, BUT, having colon cancer and not knowing it is worse!!  Colon cancer so often starts as a polyp that can be seen and removed at colonscopy, which can quite literally save your life!  And if your first colonoscopy is normal and you have no family history of colon cancer, most women don't have to have it again for 10 years.  Once every ten years, you can do something that may end up saving your life, right?  We will  be happy to help you get it scheduled when you are ready.  Be sure to check your insurance coverage so you understand how much it will cost.  It may be covered as a preventative service at no cost, but you should check your particular policy.    El calcio en los alimentos Calcium in Foods El calcio es un mineral del organismo. Intel del cuerpo, el calcio es el que predomina. La mayor parte del suministro de calcio del cuerpo se almacena en los huesos y los dientes. El calcio ayuda a muchas partes del cuerpo a funcionar, como las  siguientes: La sangre y los vasos sanguneos. Los nervios. Las hormonas. Los msculos. Los [employer] y los dientes. Cuando las reservas de calcio son [place], [place] correr riesgo de tener poca masa sea, prdida sea y [place] rotos. Obtener una cantidad suficiente de calcio ayuda a [employer] y los dientes fuertes a lo largo de la vida. El calcio es importante especialmente para: Los nios [employer] picos de crecimiento. Las Forensic scientist. Las mujeres embarazadas o que estn [place]. Las mujeres despus de que se detiene el ciclo menstrual (posmenopusicas). Las mujeres cuyo ciclo menstrual se detiene debido a un trastorno alimentario o a la actividad fsica intensa y regular. Las personas que no pueden comer ni digerir productos lcteos. Las personas que siguen una alimentacin vegana. Cantidades de calcio diarias recomendadas: Mujeres (entre 19 y 55 aos): 1000 mg por Futures trader. Mujeres (a partir de 51 aos): 1200 mg por Futures trader. Hombres (entre 19 y 57 aos): 1000 mg por da. Hombres (a partir de 65 aos): 1200 mg por da. Mujeres (entre 9 y 52 aos): 1300 mg por Futures trader. Hombres (entre 9 y 43 aos): 1300 mg por Futures trader. Informacin general Consuma alimentos ricos en calcio. Intente obtener la mayor parte del calcio a travs de los alimentos. Algunas personas pueden beneficiarse de tomar suplementos de calcio. Consulte al mdico o a un profesional en alimentacin saludable, denominado nutricionista, antes de empezar a tomar suplementos de calcio. Los suplementos de calcio pueden interactuar con determinados medicamentos. Demasiado calcio puede ocasionar otros problemas de salud, como problemas para defecar y clculos renales. Para que el cuerpo absorba el calcio, necesita vitamina D. Las fuentes de vitamina D incluyen: Exposicin directa de la piel a la luz del sol. Alimentos, como yema de [place], [place], [place], pescados de agua salada y [place] fortificada. Suplementos de  vitamina D. Consulte al mdico o al nutricionista antes de empezar a tomar un suplemento de vitamina D. La cantidad de calcio que absorbe el organismo depende del tipo de alimento. Hable con un nutricionista sobre qu alimentos son los mejores para usted, sobre todo si tiene una alimentacin vegana o no consume lcteos. Qu alimentos son ricos en calcio?  Los alimentos ricos en calcio contienen ms de 100 miligramos por porcin. Frutas Jugo de naranja u otra fruta fortificado, 300 mg por porcin de 8 onzas (237 ml). Verduras Col berza, 260 mg por porcin de 1 taza (130 g), cocida. Col rizada, 180 mg por porcin de 1 taza (118 g), cocida. Bok choy, 180 mg por porcin de 1 taza (170 g), cocida. Cereales Waffles fortificados congelados, 200 mg en 2 waffles. Avena, 180 mg por porcin de 1 taza (234 g), cocida. Pan blanco enriquecido, 175 mg por rebanada. Carnes y otras protenas [place] con espinas en lata, 350 mg por porcin de 3.75 onzas (92 g). Salmn con espinas en lata, 168 mg por porcin  de 3 onzas (85 g). Camarones en lata, 125 mg por porcin de 3 onzas (85 g). Frijoles cocidos, 120 mg por porcin de 1 taza (266 g). Tofu firme y hecho con sulfato de calcio, 861 mg por porcin de  taza (126 g). Lcteos Yogur natural bajo en grasas, 448 mg por porcin de 1 taza (245 g). Leche descremada, 300 mg por porcin de 1 taza (245 g). Queso americano, 145 mg por porcin de 1 onza (21 g) o 1 feta. Queso cheddar, 200 mg por porcin de 1 onza (28 g) o 1 feta. Queso cottage 2%, 125 mg por porcin de  taza (113 g). Leche enriquecida de soja, arroz o almendras, 300 mg por porcin de 1 taza (237 ml). Mozzarella parcialmente descremada, 210 mg por porcin de 1 onza (21 g). Es posible que los productos enumerados anteriormente no sean la lista completa de los alimentos ricos en calcio. Las cantidades reales de calcio pueden ser diferentes segn el procesamiento. Consulte a un nutricionista para  obtener ms informacin. Qu alimentos tienen bajo contenido de calcio? Los alimentos bajos en calcio contienen 50 mg de calcio o menos por porcin. Frutas Manzana, 1 mediana, aproximadamente 6 mg. Banana, 1 mediana, aproximadamente 12 mg. Verduras Lechuga, 19 mg por porcin de 1 taza (35 g). Tomate, 1 pequeo, aproximadamente 11 mg. Cereales Arroz blanco, 8 mg por porcin de  taza (79 g). Papas cocidas, 14 mg por porcin de 1 taza (160 g). Pan blanco, 6 mg por rebanada. Carnes y otras protenas [place], 24 mg por 1 huevo (50 g). Carne roja, 7 mg por porcin de 4 onzas (80 mg). Pollo, 17 mg por porcin de 4 onzas (113 g). Pescado, bacalao o trucha, 20 mg por porcin de 4 onzas (140 g). Lcteos Queso crema normal, 14 mg por porcin de 1 cucharada (15 g). Queso Brie, 50 mg por porcin de 1 onza (32 mg). Es posible que los productos enumerados anteriormente no sean la lista completa de los alimentos con bajo contenido de calcio. Las cantidades reales de calcio pueden ser diferentes segn el procesamiento. Consulte a un nutricionista para obtener ms informacin. Esta informacin no tiene Theme park manager el consejo del mdico. Asegrese de hacerle al mdico cualquier pregunta que tenga. Document Revised: 07/14/2023 Document Reviewed: 07/14/2023 Elsevier Patient Education  2024 [employer].

## 2024-03-14 ENCOUNTER — Ambulatory Visit: Payer: Self-pay | Admitting: Obstetrics and Gynecology

## 2024-03-14 LAB — SURGICAL PATHOLOGY

## 2024-03-15 ENCOUNTER — Encounter (INDEPENDENT_AMBULATORY_CARE_PROVIDER_SITE_OTHER): Payer: Self-pay | Admitting: Ophthalmology

## 2024-03-15 ENCOUNTER — Ambulatory Visit (INDEPENDENT_AMBULATORY_CARE_PROVIDER_SITE_OTHER): Admitting: Ophthalmology

## 2024-03-15 DIAGNOSIS — H353211 Exudative age-related macular degeneration, right eye, with active choroidal neovascularization: Secondary | ICD-10-CM

## 2024-03-15 DIAGNOSIS — H35711 Central serous chorioretinopathy, right eye: Secondary | ICD-10-CM | POA: Diagnosis not present

## 2024-03-15 DIAGNOSIS — I10 Essential (primary) hypertension: Secondary | ICD-10-CM

## 2024-03-15 DIAGNOSIS — H59021 Cataract (lens) fragments in eye following cataract surgery, right eye: Secondary | ICD-10-CM

## 2024-03-15 DIAGNOSIS — H3341 Traction detachment of retina, right eye: Secondary | ICD-10-CM

## 2024-03-15 DIAGNOSIS — H5789 Other specified disorders of eye and adnexa: Secondary | ICD-10-CM

## 2024-03-15 DIAGNOSIS — H25813 Combined forms of age-related cataract, bilateral: Secondary | ICD-10-CM

## 2024-03-15 DIAGNOSIS — H35033 Hypertensive retinopathy, bilateral: Secondary | ICD-10-CM

## 2024-03-15 DIAGNOSIS — H3521 Other non-diabetic proliferative retinopathy, right eye: Secondary | ICD-10-CM

## 2024-03-15 DIAGNOSIS — Z961 Presence of intraocular lens: Secondary | ICD-10-CM

## 2024-03-15 DIAGNOSIS — H33103 Unspecified retinoschisis, bilateral: Secondary | ICD-10-CM

## 2024-03-20 LAB — CYTOLOGY - PAP
Adequacy: ABSENT
Comment: NEGATIVE
Diagnosis: NEGATIVE
High risk HPV: NEGATIVE

## 2024-03-23 ENCOUNTER — Encounter (INDEPENDENT_AMBULATORY_CARE_PROVIDER_SITE_OTHER): Payer: Self-pay | Admitting: Ophthalmology

## 2024-03-29 NOTE — Progress Notes (Signed)
 [facility] - Clinic Note  04/12/2024     CHIEF COMPLAINT Patient presents for Retina Follow Up   HISTORY OF PRESENT ILLNESS: [name] is a 53 y.o. female who presents to the clinic today for:  HPI     Retina Follow Up   Patient presents with  Retinal Break/Detachment.  In right eye.  This started 4 weeks ago.  Duration of 4 weeks.  Since onset it is stable.  I, the attending physician,  performed the HPI with the patient and updated documentation appropriately.        Comments   4 week retina follow up RD OD pt is reporting no vision changes noticed she denies any flashes or floaters       Last edited by Valdemar Rogue, MD on 04/12/2024 11:50 PM.      Patient states vomiting yesterday due to intestinal problems. States no vision changes.  Referring physician: Mason Haywood LABOR, PA-C [address] [place],  [place] [address]  HISTORICAL INFORMATION:   Selected notes from the MEDICAL RECORD NUMBER Referred by Dr. Devere Kitty for concern of sub-macular fluid / retinoschisis   CURRENT MEDICATIONS: Current Outpatient Medications (Ophthalmic Drugs)  Medication Sig   bacitracin -polymyxin b  (POLYSPORIN ) ophthalmic ointment Place into the right eye 4 (four) times daily.   Brinzolamide-Brimonidine  (SIMBRINZA) 1-0.2 % SUSP Place 1 drop into the right eye in the morning, at noon, and at bedtime.   Bromfenac  Sodium 0.09 % SOLN INSTILL 1 DROP IN THE MORNING, AT NOON, IN THE EVENING, AND AT BEDTIME. 1 DROP INTO RIGHT EYE 4 TIMES DAILY   ketorolac  (ACULAR ) 0.5 % ophthalmic solution Place 1 drop into the left eye 4 (four) times daily.   prednisoLONE  acetate (PRED FORTE ) 1 % ophthalmic suspension Place 1 drop into the right eye 6 (six) times daily.   No current facility-administered medications for this visit. (Ophthalmic Drugs)   Current Outpatient Medications (Other)  Medication Sig   acetaminophen  (TYLENOL ) 500 MG tablet Take  500-1,000 mg by mouth every 6 (six) hours as needed (pain.).   Estradiol  (VAGIFEM ) 10 MCG TABS vaginal tablet Place 1 tablet (10 mcg total) vaginally 2 (two) times a week. Use every night before bed for two weeks when you first begin this medicine, then after the first two weeks, begin using it twice a week.   hydrochlorothiazide (HYDRODIURIL) 25 MG tablet Take 25 mg by mouth daily.   ibuprofen  (ADVIL ) 200 MG tablet Take 200-400 mg by mouth every 8 (eight) hours as needed (pain.).   lisinopril  (PRINIVIL ,ZESTRIL ) 20 MG tablet Take 1 tablet (20 mg total) by mouth daily.   nitrofurantoin , macrocrystal-monohydrate, (MACROBID ) 100 MG capsule Take 1 capsule by mouth 2 (two) times daily. (Patient not taking: Reported on 03/12/2024)   predniSONE  (DELTASONE ) 20 MG tablet TAKE 2 TABLETS BY MOUTH ONCE DAILY WITH BREAKFAST (Patient not taking: Reported on 03/12/2024)   traMADol  (ULTRAM ) 50 MG tablet TAKE 1 TABLET BY MOUTH EVERY 6 HOURS AS NEEDED FOR SEVER PAIN. FOR AFTER SURGERY, DO NOT TAKE AND DRIVE (Patient not taking: Reported on 03/12/2024)   No current facility-administered medications for this visit. (Other)   REVIEW OF SYSTEMS: ROS   Positive for: Cardiovascular, Eyes Negative for: Constitutional, Gastrointestinal, Neurological, Skin, Genitourinary, Musculoskeletal, HENT, Endocrine, Respiratory, Psychiatric, Allergic/Imm, Heme/Lymph Last edited by Resa Delon ORN, COT on 04/12/2024  8:52 AM.       ALLERGIES Allergies  Allergen Reactions   Pollen Extract Itching  POLLEN EXTRACTS    PAST MEDICAL HISTORY 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   followed by dr  Aldona Bryner;  pt takes  inspra  for excess fluid in retina,   Sciatic nerve injury    Seasonal allergies    Vaginal dysplasia    Wears glasses    Past Surgical History:  Procedure Laterality Date   CERVICAL CONIZATION W/BX N/A 06/30/2023   Procedure: CONIZATION CERVIX WITH BIOPSY, ENDOCERVICAL CURETTAGE;  Surgeon: Viktoria Comer SAUNDERS, MD;  Location: [facility];  Service: Gynecology;  Laterality: N/A;   CO2 LASER APPLICATION N/A 06/30/2023   Procedure: CO2 LASER TO THE VAGINA;  Surgeon: Viktoria Comer SAUNDERS, MD;  Location: [facility];  Service: Gynecology;  Laterality: N/A;   COLONOSCOPY  12/14/2022   HYSTEROSCOPY WITH RESECTOSCOPE  11/25/2006   @[facility]  by dr jinny. winfred;   Resectoscopic myomectomy and polypectomy   INJECTION OF SILICONE OIL Right 09/15/2023   Procedure: INJECTION OF SILICONE OIL;  Surgeon: Valdemar Rogue, MD;  Location: [facility] OR;  Service: Ophthalmology;  Laterality: Right;   INJECTION OF SILICONE OIL Right 02/23/2024   Procedure: INJECTION, SILICONE OIL EXCHANGE;  Surgeon: Valdemar Rogue, MD;  Location: [facility] OR;  Service: Ophthalmology;  Laterality: Right;   PARS PLANA VITRECTOMY Right 09/15/2023   Procedure: PARS PLANA VITRECTOMY WITH 25 GAUGE;  Surgeon: Valdemar Rogue, MD;  Location: [facility] OR;  Service: Ophthalmology;  Laterality: Right;   PARS PLANA VITRECTOMY W/ FOCAL ENDOLASER PHOTOCOAGULATION Right 02/23/2024   Procedure: 25 GAUGE PARS PLANA VITRECTOMY  WITH LENSECTOMY, MEMBRANE PEEL, AND ENDOLASER;  Surgeon: Valdemar Rogue, MD;  Location[facility] OR;  Service: Ophthalmology;  Laterality: Right;   REMOVAL RETAINED LENS Right 02/23/2024   Procedure: REMOVAL, RETAINED LENS MATTER;  Surgeon: Valdemar Rogue, MD;  Location: [facility] OR;  Service: Ophthalmology;  Laterality: Right;   REPAIR OF COMPLEX TRACTION RETINAL DETACHMENT Right 02/23/2024   Procedure: REPAIR, RETINAL DETACHMENT, COMPLEX;  Surgeon: Valdemar Rogue, MD;  Location: [facility] OR;  Service: Ophthalmology;  Laterality:  Right;   VAGINECTOMY, PARTIAL N/A 06/30/2023   Procedure: SMALL VAGINECTOMY, PARTIAL BIOSPIES;  Surgeon: Viktoria Comer SAUNDERS, MD;  Location: [facility];  Service: Gynecology;  Laterality: N/A;   FAMILY HISTORY Family History  Problem Relation Age of Onset   Hypertension Mother    Diabetes Father    Diabetes Maternal Grandmother    Hypertension Maternal Grandmother    Colon cancer Neg Hx    Colon polyps Neg Hx    Esophageal cancer Neg Hx    Rectal cancer Neg Hx    Stomach cancer Neg Hx    SOCIAL HISTORY Social History   Tobacco Use   Smoking status: Never   Smokeless tobacco: Never  Vaping Use   Vaping status: Never Used  Substance Use Topics   Alcohol use: No    Alcohol/week: 0.0 standard drinks of alcohol   Drug use: Never       OPHTHALMIC EXAM: Base Eye Exam     Visual Acuity (Snellen - Linear)       Right Left  Dist cc CF at 3' 20/25   Dist ph cc NI NI         Tonometry (Applanation, 8:55 AM)       Right Left   Pressure 9 11         Pupils       Pupils Dark Light Shape React APD   Right PERRL 5 5 Round NR    Left PERRL 3 2 Round Brisk None         Visual Fields       Left Right   Restrictions  Total superior temporal, inferior temporal, superior nasal, inferior nasal deficiencies         Neuro/Psych     Oriented x3: Yes   Mood/Affect: Normal         Dilation     Both eyes: 2.5% Phenylephrine  @ 8:55 AM           Slit Lamp and Fundus Exam     Slit Lamp Exam       Right Left   Lids/Lashes Dermatochalasis - upper lid Dermatochalasis - upper lid   Conjunctiva/Sclera White and quiet temporally, nasal pinguecula 1+ Injection, nasal Pinguecula   Cornea Nasal ptergyium extending 2.44mm onto nasal cornea -- less inflamed, peripheral haze, well healed cataract wound, trace Punctate epithelial erosions, 1+ Guttata 1+Punctate epithelial erosions   Anterior Chamber Deep, silicone oil bubbles deep, narrow temporal angle,  1+pigment   Iris Irregular dilation, +PS temporally Round and dilated   Lens 3 piece sulcus IOL with mild nasal displacement and open PC 2+ Nuclear sclerosis, 2+ Cortical cataract   Anterior Vitreous post vitrectomy, good silicone oil fill, lens remnant gone Mild Vitreous syneresis         Fundus Exam       Right Left   Disc hazy view, 3+pallor, Sharp rim    C/D Ratio 0.4 0.6   Macula hazy view, flat under oil    Vessels Attenuated, mild tortuosity; fibrosis improved    Periphery 360 laser, flat under oil, relaxing retinotomy temporally, dense fibrosis temporal periphery improved            Refraction     Wearing Rx       Sphere Cylinder Axis   Right +1.25 +1.25 112   Left +2.25 +1.00 101           IMAGING AND PROCEDURES  Imaging and Procedures for @TODAY @  OCT, Retina - OU - Both Eyes       Right Eye Quality was borderline. Central Foveal Thickness: 238. Progression has improved. Findings include no IRF, no SRF, abnormal foveal contour, subretinal hyper-reflective material, epiretinal membrane, pigment epithelial detachment, outer retinal atrophy (Better image quality, retina stably reattached, diffuse atrophy peripherally).   Left Eye Quality was good. Central Foveal Thickness: 299. Progression has improved. Findings include normal foveal contour, no IRF, no SRF, vitreomacular adhesion (Shallow sliver of SRF IN to disc caught on widefield--improved, bullous schisis cavity IT periphery caught on widefield -- not imaged today).   Notes *Images captured and stored on drive  Diagnosis / Impression:  OD: Better image quality, retina stably reattached, diffuse atrophy peripherally OS: Shallow sliver of SRF IN to disc caught on widefield--improved, bullous schisis cavity IT periphery caught on widefield -- not imaged today  Clinical management:  See below  Abbreviations: NFP - Normal foveal profile. CME - cystoid macular edema. PED - pigment epithelial detachment.  IRF - intraretinal fluid. SRF - subretinal fluid. EZ - ellipsoid  zone. ERM - epiretinal membrane. ORA - outer retinal atrophy. ORT - outer retinal tubulation. SRHM - subretinal hyper-reflective material             ASSESSMENT/PLAN:    ICD-10-CM   1. Ocular inflammation  H57.89     2. 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 Traction detachment of right retina  H33.41     4. 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 Essential hypertension  I10     8. Hypertensive retinopathy of both eyes  H35.033     9. Combined forms of age-related cataract of both eyes  H25.813     10. Pseudophakia  Z96.1      1-3. Ocular inflammation and PVR retinal detachment OD - 10.16.24 pt started seeing new floaters and sparks in vision - pt developed progressively worsening vision and vit haze - VA OD improved from LP to CF - pt was on PredForte q1h OD, Prolensa  QID OD, and 40 mg po Prednisone  daily - b-scan 12.04.24 shows significant mobile vit opacities, ?RD - s/p PPV/PFO/EL/FAX/SO OD, 12.05.2024  - undiluted vit samples obtained intra-op for gram stain and culture -- no organisms, but +WBCs - intra-op - clearance of vitreous haze showed extensive fibrosis and PVR tractional detachment -- silicon oil added at end of case - AC was shallow with angle closed -- but now deep s/p CEIOL - exam showed residual intraretinal fibrosis and persistent SRF - s/p PPV/lensectomy/membrane peel/relaxing retinotomy/silicon oil exchange OD, 05.15.2025             - doing well - lens material gone; good oil fill             - IOP okay at 09             - cont   PF QID OD                          ketorolac  QID OD                         PSO ung bedtime / PRN OD             - post op  drop and positioning instructions reviewed              - tylenol /ibuprofen  for pain   - returned to work on Monday, March 19, 2024 -- no straining, no lifting more than 10-15lbs, no pushing or pulling - f/u 4-6 wks  4,5. CSCR OD -- chronic  - pt reports decreased vision OD since December 2019  - reports significant stressors in life -- work  - denies steroid use - FA (09.30.20) shows focal hyperfluorescent leakage points nasal macula OD -- expansile dot phenotype? - started 50 mg po eplerenone  since 09.30.20  -- pt self d/c in February, re-started in June 2021 - s/p IVA OD #1 (09.01.21), #2 (09.29.21), #3 (11.03.21), #4 (12.03.21) -- IVA resistance - s/p IVE OD #1 (01.20.22), #2 (02.18.22), #3 (04.05.22-sample), #4 (05.23.22), # 5 (06.27.22), #6 (07.28.22), #7 (08.26.22), #8 (09.23.22), #9 (10.21.22), #10 (11.18.22), #11 (12.16.22), #12 (01.20.23), #13 (03.02.23), #14 (04.07.23), #15 (05.12.23), #16 (06.09.23), #17 (07.14.23), #18 (  08.08.23), #19 (09.08.23), #20 (10.06.23), #21 (11.06.23), #22 (12.04.23), #23 (01.08.24), #24 (02.12.24), #25 (03.18.24), #26 (04.22.24), #27 (05.20.24) -- IVE resistance ======================================================== - s/p IVV OD #1 (07.02.24), #2 (07.30.24), #3 (08.27.24), #4 (09.26.24), #5 (10.24.24)  - differential includes atypical exudative ARMD - IVV informed consent obtained and signed, 07.02.24  - stop po eplerenone  50 mg daily for now  - monitor for now  6. Retinoschisis OU (OD > OS)  - bullous retinoschisis cavity IT quadrant OD-- 9299-9169 oclock  - bullous retinoschisis cavity IT quadrant OS -- 9669-9569 oclock  - no associated retinal holes/RT/RD on scleral depression   - widefield Optos imaging (04.22.24) shows mild interval progression of schisis cavities' size and posterior extension in comparison to baseline images  - s/p laser retinopexy OS 08.12.24 -- good laser changes surrounding - s/p laser retinopexy OD (10.07.24) - new schisis  cavity found on examination OD (10.28.24) - 1030 periphery -- no RT/RD on scleral depressed exam - s/p laser retinopexy OD to new schisis at 1030 on 10.28.24 - monitor  7,8. Hypertensive retinopathy OU  - discussed importance of tight BP control  - monitor  9. Mixed Cataract OS - The symptoms of cataract, surgical options, and treatments and risks were discussed with patient. - discussed diagnosis and progression - not yet visually significant - monitor for now  10,11. Pseudophakia OD  - s/p CE/IOL OD (Dr. Cleopatra, 04.04.25)  - complicated PC rupture and retained lens material in vitreous  - 3-piece IOL with mild nasal displacement but appears stably scarred in place  - monitor   Ophthalmic Meds Ordered this visit:  No orders of the defined types were placed in this encounter.    Return for 4-6 wks - f/u PVR RD OD - DFE, OCT.  There are no Patient Instructions on file for this visit.  This document serves as a record of services personally performed by Redell JUDITHANN Hans, MD, PhD. It was created on their behalf by Alan PARAS. Delores, OA an ophthalmic technician. The creation of this record is the provider's dictation and/or activities during the visit.    Electronically signed by: Alan PARAS. Delores, OA 04/12/24 11:52 PM   Electronically signed by: Alan PARAS. Delores, OA 04/12/24 11:52 PM  Redell JUDITHANN Hans, M.D., Ph.D. Diseases & Surgery of the Retina and Vitreous [facility]  I have reviewed the above documentation for accuracy and completeness, and I agree with the above. Redell JUDITHANN Hans, M.D., Ph.D. 04/12/24 11:53 PM   Abbreviations: M myopia (nearsighted); A astigmatism; H hyperopia (farsighted); P presbyopia; Mrx spectacle prescription;  CTL contact lenses; OD right eye; OS left eye; OU both eyes  XT exotropia; ET esotropia; PEK punctate epithelial keratitis; PEE punctate epithelial erosions; DES dry eye syndrome; MGD meibomian gland dysfunction; ATs  artificial tears; PFAT's preservative free artificial tears; NSC nuclear sclerotic cataract; PSC posterior subcapsular cataract; ERM epi-retinal membrane; PVD posterior vitreous detachment; RD retinal detachment; DM diabetes mellitus; DR diabetic retinopathy; NPDR non-proliferative diabetic retinopathy; PDR proliferative diabetic retinopathy; CSME clinically significant macular edema; DME diabetic macular edema; dbh dot blot hemorrhages; CWS cotton wool spot; POAG primary open angle glaucoma; C/D cup-to-disc ratio; HVF humphrey visual field; GVF goldmann visual field; OCT optical coherence tomography; IOP intraocular pressure; BRVO Branch retinal vein occlusion; CRVO central retinal vein occlusion; CRAO central retinal artery occlusion; BRAO branch retinal artery occlusion; RT retinal tear; SB scleral buckle; PPV pars plana vitrectomy; VH Vitreous hemorrhage; PRP panretinal laser photocoagulation; IVK intravitreal kenalog ;  VMT vitreomacular traction; MH Macular hole;  NVD neovascularization of the disc; NVE neovascularization elsewhere; AREDS age related eye disease study; ARMD age related macular degeneration; POAG primary open angle glaucoma; EBMD epithelial/anterior basement membrane dystrophy; ACIOL anterior chamber intraocular lens; IOL intraocular lens; PCIOL posterior chamber intraocular lens; Phaco/IOL phacoemulsification with intraocular lens placement; PRK photorefractive keratectomy; LASIK laser assisted in situ keratomileusis; HTN hypertension; DM diabetes mellitus; COPD chronic obstructive pulmonary disease

## 2024-04-12 ENCOUNTER — Ambulatory Visit (INDEPENDENT_AMBULATORY_CARE_PROVIDER_SITE_OTHER): Admitting: Ophthalmology

## 2024-04-12 ENCOUNTER — Encounter (INDEPENDENT_AMBULATORY_CARE_PROVIDER_SITE_OTHER): Payer: Self-pay | Admitting: Ophthalmology

## 2024-04-12 DIAGNOSIS — H353211 Exudative age-related macular degeneration, right eye, with active choroidal neovascularization: Secondary | ICD-10-CM

## 2024-04-12 DIAGNOSIS — H3521 Other non-diabetic proliferative retinopathy, right eye: Secondary | ICD-10-CM

## 2024-04-12 DIAGNOSIS — H33103 Unspecified retinoschisis, bilateral: Secondary | ICD-10-CM

## 2024-04-12 DIAGNOSIS — Z961 Presence of intraocular lens: Secondary | ICD-10-CM

## 2024-04-12 DIAGNOSIS — I10 Essential (primary) hypertension: Secondary | ICD-10-CM

## 2024-04-12 DIAGNOSIS — H35711 Central serous chorioretinopathy, right eye: Secondary | ICD-10-CM

## 2024-04-12 DIAGNOSIS — H5789 Other specified disorders of eye and adnexa: Secondary | ICD-10-CM

## 2024-04-12 DIAGNOSIS — H35033 Hypertensive retinopathy, bilateral: Secondary | ICD-10-CM

## 2024-04-12 DIAGNOSIS — H25813 Combined forms of age-related cataract, bilateral: Secondary | ICD-10-CM

## 2024-04-12 DIAGNOSIS — H3341 Traction detachment of retina, right eye: Secondary | ICD-10-CM

## 2024-04-26 ENCOUNTER — Ambulatory Visit: Payer: Self-pay | Admitting: Gastroenterology

## 2024-04-26 ENCOUNTER — Encounter: Payer: Self-pay | Admitting: Gastroenterology

## 2024-04-26 ENCOUNTER — Other Ambulatory Visit

## 2024-04-26 ENCOUNTER — Ambulatory Visit: Admitting: Gastroenterology

## 2024-04-26 VITALS — BP 126/80 | HR 84 | Ht 65.0 in | Wt 156.0 lb

## 2024-04-26 DIAGNOSIS — R7989 Other specified abnormal findings of blood chemistry: Secondary | ICD-10-CM

## 2024-04-26 LAB — HEPATIC FUNCTION PANEL
ALT: 20 U/L (ref 0–35)
AST: 14 U/L (ref 0–37)
Albumin: 4.3 g/dL (ref 3.5–5.2)
Alkaline Phosphatase: 118 U/L — ABNORMAL HIGH (ref 39–117)
Bilirubin, Direct: 0 mg/dL (ref 0.0–0.3)
Total Bilirubin: 0.4 mg/dL (ref 0.2–1.2)
Total Protein: 8.3 g/dL (ref 6.0–8.3)

## 2024-04-26 NOTE — Patient Instructions (Signed)
 You have been scheduled for an abdominal ultrasound at [facility] (1st floor of hospital) on 04/30/24 at 8:00am. Please arrive 30 minutes prior to your appointment for registration. Make certain not to have anything to eat or drink 6 hours prior to your appointment. Should you need to reschedule your appointment, please contact radiology at [phone]. This test typically takes about 30 minutes to perform.  Your provider has requested that you go to the basement level for lab work before leaving today. Press B on the elevator. The lab is located at the first door on the left as you exit the elevator.  _______________________________________________________  If your blood pressure at your visit was 140/90 or greater, please contact your primary care physician to follow up on this.  _______________________________________________________  If you are age 30 or older, your body mass index should be between 23-30. Your Body mass index is 25.96 kg/m. If this is out of the aforementioned range listed, please consider follow up with your Primary Care Provider.  If you are age 65 or younger, your body mass index should be between 19-25. Your Body mass index is 25.96 kg/m. If this is out of the aformentioned range listed, please consider follow up with your Primary Care Provider.   ________________________________________________________  The [facility] providers would like to encourage you to use MYCHART to communicate with providers for non-urgent requests or questions.  Due to long hold times on the telephone, sending your provider a message by [facility] may be a faster and more efficient way to get a response.  Please allow 48 business hours for a response.  Please remember that this is for non-urgent requests.  _______________________________________________________  Thank you for trusting me with your gastrointestinal care. Deanna May, RNP

## 2024-04-26 NOTE — Progress Notes (Signed)
 Chief Complaint:elevated liver enzymes Primary GI Doctor:Dr. San  HPI:  Patient is a  53  year old female patient with past medical history of hypertension who was referred to me by Mason Haywood LABOR, PA-C on 03/13/24 for a complaint of elevated liver enzymes.  *Due to language barrier, an interpreter was present during the history-taking and subsequent discussion (and for part of the physical exam) with this patient.    Interval History     Patient presents for evaluation elevated liver enzymes. No history of liver disease.  No new medications. No herbal supplements. Patient reports hepatitis as a child, unsure what kind.  Patient reports she was instructed to go to her pharmacy to receive hepatitis vaccination per PCP.  Unsure which vaccination she received. Patient is currently on steroids for shoulder pain, she started 10 days ago. Patient denies alcohol use. No IV drug use. No history of blood transfusion.  Patient uses OTC Tylenol  500 mg 2 tablets prn, last time she took it was last week. Reports she does not take it regularly.   Patient has lost 20lbs in last 2 months. She reports she has been eating less.   Patient denies family history of liver disease.   Patient never had EGD.  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   followed by dr zamora;  pt takes  inspra  for excess fluid in retina,   Sciatic nerve injury    Seasonal allergies    Vaginal dysplasia    Wears glasses     Past Surgical  History:  Procedure Laterality Date   CERVICAL CONIZATION W/BX N/A 06/30/2023   Procedure: CONIZATION CERVIX WITH BIOPSY, ENDOCERVICAL CURETTAGE;  Surgeon: Viktoria Comer SAUNDERS, MD;  Location: [facility] ;  Service: Gynecology;  Laterality: N/A;   CO2 LASER APPLICATION N/A 06/30/2023   Procedure: CO2 LASER TO THE VAGINA;  Surgeon: Viktoria Comer SAUNDERS, MD;  Location: [facility];  Service: Gynecology;  Laterality: N/A;   COLONOSCOPY  12/14/2022   HYSTEROSCOPY WITH RESECTOSCOPE  11/25/2006   @[facility]  by dr jinny. winfred;   Resectoscopic myomectomy and polypectomy   INJECTION OF SILICONE OIL Right 09/15/2023   Procedure: INJECTION OF SILICONE OIL;  Surgeon: Valdemar Rogue, MD;  Location: [facility] OR;  Service: Ophthalmology;  Laterality: Right;   INJECTION OF SILICONE OIL Right 02/23/2024   Procedure: INJECTION, SILICONE OIL EXCHANGE;  Surgeon: Valdemar Rogue, MD;  Location: [facility] OR;  Service: Ophthalmology;  Laterality: Right;   PARS PLANA VITRECTOMY Right 09/15/2023   Procedure: PARS PLANA VITRECTOMY WITH 25 GAUGE;  Surgeon: Valdemar Rogue, MD;  Location: [facility] OR;  Service: Ophthalmology;  Laterality: Right;   PARS PLANA VITRECTOMY W/ FOCAL ENDOLASER PHOTOCOAGULATION Right 02/23/2024   Procedure: 25 GAUGE PARS PLANA VITRECTOMY  WITH LENSECTOMY, MEMBRANE PEEL, AND ENDOLASER;  Surgeon: Valdemar Rogue, MD;  Location: [facility] OR;  Service: Ophthalmology;  Laterality: Right;   REMOVAL  RETAINED LENS Right 02/23/2024   Procedure: REMOVAL, RETAINED LENS MATTER;  Surgeon: Valdemar Rogue, MD;  Location: [facility] OR;  Service: Ophthalmology;  Laterality: Right;   REPAIR OF COMPLEX TRACTION RETINAL DETACHMENT Right 02/23/2024   Procedure: REPAIR, RETINAL DETACHMENT, COMPLEX;  Surgeon: Valdemar Rogue, MD;  Location: [facility] OR;  Service: Ophthalmology;  Laterality: Right;   VAGINECTOMY, PARTIAL N/A 06/30/2023   Procedure: SMALL VAGINECTOMY, PARTIAL BIOSPIES;  Surgeon: Viktoria Comer SAUNDERS, MD;  Location: [facility];  Service: Gynecology;  Laterality: N/A;    Current Outpatient Medications  Medication Sig Dispense Refill   acetaminophen  (TYLENOL ) 500 MG tablet Take 500-1,000 mg by mouth every 6 (six) hours as needed (pain.).     bacitracin -polymyxin b  (POLYSPORIN ) ophthalmic ointment Place into the right eye 4 (four) times daily. 3.5 g 5   hydrochlorothiazide (HYDRODIURIL) 25 MG tablet Take 25 mg by mouth daily.     ibuprofen  (ADVIL ) 200 MG tablet Take 200-400 mg by mouth every 8 (eight) hours as needed (pain.).     ketorolac  (ACULAR ) 0.5 % ophthalmic solution Place 1 drop into the left eye 4 (four) times daily. 10 mL 0   lisinopril  (PRINIVIL ,ZESTRIL ) 20 MG tablet Take 1 tablet (20 mg total) by mouth daily. 30 tablet 12   Brinzolamide-Brimonidine  (SIMBRINZA) 1-0.2 % SUSP Place 1 drop into the right eye in the morning, at noon, and at bedtime. (Patient not taking: Reported on 04/26/2024)     Bromfenac  Sodium 0.09 % SOLN INSTILL 1 DROP IN THE MORNING, AT NOON, IN THE EVENING, AND AT BEDTIME. 1 DROP INTO RIGHT EYE 4 TIMES DAILY (Patient not taking: Reported on 04/26/2024)     Estradiol  (VAGIFEM ) 10 MCG TABS vaginal tablet Place 1 tablet (10 mcg total) vaginally 2 (two) times a week. Use every night before bed for two weeks when you first begin this medicine, then after the first two weeks, begin using it twice a week. (Patient not taking: Reported on 04/26/2024) 34 tablet 3   nitrofurantoin , macrocrystal-monohydrate, (MACROBID ) 100 MG capsule Take 1 capsule by mouth 2 (two) times daily. (Patient not taking: Reported on 04/26/2024)     prednisoLONE  acetate (PRED FORTE ) 1 % ophthalmic suspension Place 1 drop into the right eye 6 (six) times daily. (Patient not taking: Reported on 04/26/2024) 15 mL 0   predniSONE  (DELTASONE ) 20 MG tablet TAKE 2 TABLETS BY MOUTH ONCE DAILY WITH BREAKFAST (Patient not taking: Reported on 04/26/2024)     traMADol  (ULTRAM ) 50 MG tablet TAKE 1 TABLET BY MOUTH EVERY 6 HOURS AS NEEDED FOR  SEVER PAIN. FOR AFTER SURGERY, DO NOT TAKE AND DRIVE (Patient not taking: Reported on 03/12/2024)     No current facility-administered medications for this visit.    Allergies as of 04/26/2024 - Review Complete 04/12/2024  Allergen Reaction Noted   Pollen extract Itching 12/28/2021    Family History  Problem Relation Age of Onset   Hypertension Mother    Diabetes Father    Diabetes Maternal Grandmother    Hypertension Maternal Grandmother    Colon cancer Neg Hx    Colon polyps Neg Hx    Esophageal cancer Neg Hx    Rectal cancer Neg Hx    Stomach cancer Neg Hx     Review of Systems:    Constitutional: No weight loss, fever, chills, weakness or fatigue HEENT: Eyes: No change in vision               Ears, Nose, Throat:  No change in  hearing or congestion Skin: No rash or itching Cardiovascular: No chest pain, chest pressure or palpitations   Respiratory: No SOB or cough Gastrointestinal: See HPI and otherwise negative Genitourinary: No dysuria or change in urinary frequency Neurological: No headache, dizziness or syncope Musculoskeletal: No new muscle or joint pain Hematologic: No bleeding or bruising Psychiatric: No history of depression or anxiety    Physical Exam:  Vital signs: BP 126/80   Pulse 84   Ht 5' 5 (1.651 m)   Wt 156 lb (70.8 kg)   LMP 02/18/2022 Comment: sexually active, condoms  SpO2 95%   BMI 25.96 kg/m   Constitutional:   Pleasant Hispanic female appears to be in NAD, Well developed, Well nourished, alert and cooperative Throat: Oral cavity and pharynx without inflammation, swelling or lesion.  Respiratory: Respirations even and unlabored. Lungs clear to auscultation bilaterally.   No wheezes, crackles, or rhonchi.  Cardiovascular: Normal S1, S2. Regular rate and rhythm. No peripheral edema, cyanosis or pallor.  Gastrointestinal:  Soft, nondistended, nontender. No rebound or guarding. Normal bowel sounds. No appreciable masses or  hepatomegaly. Rectal:  Not performed.  Msk:  Symmetrical without gross deformities. Without edema, no deformity or joint abnormality.  Neurologic:  Alert and  oriented x4;  grossly normal neurologically.  Skin:   Dry and intact without significant lesions or rashes. Psychiatric: Oriented to person, place and time. Demonstrates good judgement and reason without abnormal affect or behaviors.  RELEVANT LABS AND IMAGING: CBC    Latest Ref Rng & Units 02/23/2024    9:09 AM 09/15/2023    9:25 AM 06/30/2023    7:01 AM  CBC  WBC 4.0 - 10.5 K/uL 9.2  8.7    Hemoglobin 12.0 - 15.0 g/dL 87.1  85.4  85.3   Hematocrit 36.0 - 46.0 % 39.4  45.1  43.0   Platelets 150 - 400 K/uL 561  404       CMP     Latest Ref Rng & Units 02/23/2024    9:09 AM 09/15/2023    9:25 AM 06/30/2023    7:01 AM  CMP  Glucose 70 - 99 mg/dL 92  896  899   BUN 6 - 20 mg/dL 11  14  11    Creatinine 0.44 - 1.00 mg/dL 9.43  9.25  9.49   Sodium 135 - 145 mmol/L 137  136  139   Potassium 3.5 - 5.1 mmol/L 3.4  3.2  3.0   Chloride 98 - 111 mmol/L 103  102  101   CO2 22 - 32 mmol/L 22  22    Calcium 8.9 - 10.3 mg/dL 9.2  9.3       Lab Results  Component Value Date   TSH 0.79 08/03/2017  03/09/2024 labs show-WBC 9.1, hemoglobin 13.2, platelets 537, BUN 25, creatinine 0.74, alk phos 192, AST 61, ALT 84 02/27/2024 labs show: BUN 16, creatinine 0.63, alk phos 129, AST 19, ALT 29, bilirubin 0.2, platelets 569, hemoglobin 12.5, WBC 7.9  Imaging: 02/16/2023 abdominal ultrasound- The visualized liver has a normal echo texture. There are 2 adjacent probable cyst in in the upper right hepatic lobe. Otherwise normal RUQ ultrasound  GI procedures 12/14/2022 colonoscopy with Dr. San - Hemorrhoids found on perianal exam. - One 5 mm polyp in the sigmoid colon, removed with a cold snare. Resected and retrieved. - Two 2 to 4 mm polyps in the ascending colon and in the cecum, removed with a cold snare. Resected and retrieved.-  Diverticulosis in the  ascending colon. - Non- bleeding internal hemorrhoids. Path: Diagnosis 1. Surgical [P], colon, sigmoid, polyp (1) - COLONIC MUCOSA WITH HYPERPLASTIC CHANGE AND SMALL LYMPHOID AGGREGATE. 2. Surgical [P], colon, cecum, ascending, polyp (2) - TUBULAR ADENOMA. - SEPARATE FRAGMENTS OF COLONIC MUCOSA WITH MILD SUPERFICIAL HYPERPLASTIC CHANGE AND LYMPHOID AGGREGATES.  Last colonoscopy was 10/2019 and notable for a 12 mm semipedunculated adenoma resected from transverse colon, 2 mm sigmoid adenoma, single diverticulum in the ascending colon, and small internal hemorrhoids. Recommended repeat in 3 years.    Assessment: Encounter Diagnosis  Name Primary?   Elevated LFTs Yes     53 year-old female Hispanic patient that presents for elevated liver enzymes.  Patient denies history of alcohol use.  Patient does report history of hepatitis unsure type.  Will try to obtain information on vaccinations received.  ALT 29 284, AST 19-61, alk phos 129-192.  Normal bilirubin.  Elevated platelets.  Will go ahead and order complete ultrasound to evaluate liver and spleen.  Will also recheck hepatic panel.  If abnormal we will proceed with full liver workup to rule out autoimmune hepatitis, genetic disorders, and full hepatitis panel.  Called patient's pharmacy and she received hepatitis A and B vaccination.  She still is due 1 more round.  Plan: -Order Abd ultrasound complete  - recheck hepatic panel  Thank you for the courtesy of this consult. Please call me with any questions or concerns.   Jonanthan Bolender, FNP-C [facility] 04/26/2024, 3:16 PM  Cc: Mason Haywood LABOR, PA-C

## 2024-04-30 ENCOUNTER — Ambulatory Visit (HOSPITAL_BASED_OUTPATIENT_CLINIC_OR_DEPARTMENT_OTHER)
Admission: RE | Admit: 2024-04-30 | Discharge: 2024-04-30 | Disposition: A | Discharge status: Home / Self Care | Source: Ambulatory Visit | Attending: Gastroenterology | Admitting: Gastroenterology

## 2024-04-30 DIAGNOSIS — R7989 Other specified abnormal findings of blood chemistry: Secondary | ICD-10-CM | POA: Insufficient documentation

## 2024-05-10 NOTE — Progress Notes (Signed)
 [facility] - Clinic Note  05/24/2024     CHIEF COMPLAINT Patient presents for Retina Follow Up   HISTORY OF PRESENT ILLNESS: [name] is a 53 y.o. 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  Severity is severe.  Duration of 6 weeks.  Since onset it is stable.  I, the attending physician,  performed the HPI with the patient and updated documentation appropriately.        Comments   6 week Retina eval. S/P Ppv lensectomy od. Patient states no changes in vision      Last edited by Valdemar Rogue, MD on 05/24/2024  9:59 AM.    Patient states vision seems stable. 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   Referring physician: Mason Haywood LABOR, PA-C [address] [place],  [place] [address]  HISTORICAL INFORMATION:   Selected notes from the MEDICAL RECORD NUMBER Referred by Dr. Devere Kitty for concern of sub-macular fluid / retinoschisis   CURRENT MEDICATIONS: No current facility-administered medications for this visit. (Ophthalmic Drugs)   No current outpatient medications on file. (Ophthalmic Drugs)   No current facility-administered medications for this visit. (Other)   No current outpatient medications on file. (Other)   Facility-Administered Medications Ordered in Other Visits (Other)  Medication Route   acetaminophen  (TYLENOL ) tablet 650 mg Oral   Or   acetaminophen  (TYLENOL ) suppository 650 mg Rectal   cefTRIAXone  (ROCEPHIN ) 2 g in sodium chloride  0.9 % 100 mL IVPB Intravenous   hydrochlorothiazide  (HYDRODIURIL ) tablet 25 mg Oral   HYDROcodone -acetaminophen  (NORCO/VICODIN) 5-325 MG per tablet 1-2 tablet Oral   HYDROmorphone  (DILAUDID ) injection 0.5-1 mg Intravenous   lisinopril  (ZESTRIL ) tablet 20 mg Oral   ondansetron  (ZOFRAN -ODT) disintegrating tablet 4 mg  Oral   polyethylene glycol (MIRALAX  / GLYCOLAX ) packet 17 g Oral   sodium chloride  flush (NS) 0.9 % injection 3 mL Intravenous   REVIEW OF SYSTEMS: ROS   Positive for: Cardiovascular, Eyes Negative for: Constitutional, Gastrointestinal, Neurological, Skin, Genitourinary, Musculoskeletal, HENT, Endocrine, Respiratory, Psychiatric, Allergic/Imm, Heme/Lymph Last edited by German Olam BRAVO, COT on 05/24/2024  7:51 AM.     ALLERGIES Allergies  Allergen Reactions   Pollen Extract Itching    POLLEN EXTRACTS    PAST MEDICAL HISTORY Past Medical History:  Diagnosis Date   Abnormal colonoscopy 10/30/2019   Impression: - One 12 mm polyp in the transverse colon, removed with a cold snare. Resected and retrieved. One 2 mm polyp in the sigmoid colon, removed with a cold snare. Resected and retrieved. Diverticulosis in the ascending colon. Non-bleeding internal hemorrhoids. Return to GI PRN. Repeat in 3 years. May be amenable to hermorrhoid band ligation.     Benign neoplasm of ascending colon 01/28/2023   12/14/2022     Benign neoplasm of transverse colon 10/30/2019   Cervical dysplasia    CIN I (cervical intraepithelial neoplasia I)    Grade II hemorrhoids 01/28/2023   12/14/2022     High risk HPV infection    History of abnormal cervical Pap smear    12-28-21 lgsil HPV HR +   History of adenomatous polyp of colon    History of infection due to human papilloma virus (HPV) 11/20/2019   Present again 06/19/2020 Not present 03/11/2023     Hypertension  Hypertensive retinopathy    OU   Macular degeneration, right eye    Missed abortion 05/04/2023   05/16/2013     Polyp of sigmoid colon 10/30/2019   Polyp of transverse colon 10/30/2019   Retinal defect, 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   followed by dr valdemar;  pt takes  inspra  for excess fluid in retina,   Sciatic nerve injury    Seasonal allergies    Vaginal dysplasia    Wears glasses    Past Surgical History:  Procedure Laterality Date    CERVICAL CONIZATION W/BX N/A 06/30/2023   Procedure: CONIZATION CERVIX WITH BIOPSY, ENDOCERVICAL CURETTAGE;  Surgeon: Viktoria Comer SAUNDERS, MD;  Location: [facility];  Service: Gynecology;  Laterality: N/A;   CO2 LASER APPLICATION N/A 06/30/2023   Procedure: CO2 LASER TO THE VAGINA;  Surgeon: Viktoria Comer SAUNDERS, MD;  Location: [facility];  Service: Gynecology;  Laterality: N/A;   COLONOSCOPY  12/14/2022   HYSTEROSCOPY WITH RESECTOSCOPE  11/25/2006   @[facility]  by dr jinny. winfred;   Resectoscopic myomectomy and polypectomy   INJECTION OF SILICONE OIL Right 09/15/2023   Procedure: INJECTION OF SILICONE OIL;  Surgeon: valdemar Rogue, MD;  Location: [facility] OR;  Service: Ophthalmology;  Laterality: Right;   INJECTION OF SILICONE OIL Right 02/23/2024   Procedure: INJECTION, SILICONE OIL EXCHANGE;  Surgeon: valdemar Rogue, MD;  Location: [facility] OR;  Service: Ophthalmology;  Laterality: Right;   PARS PLANA VITRECTOMY Right 09/15/2023   Procedure: PARS PLANA VITRECTOMY WITH 25 GAUGE;  Surgeon: valdemar Rogue, MD;  Location: [facility] OR;  Service: Ophthalmology;  Laterality: Right;   PARS PLANA VITRECTOMY W/ FOCAL ENDOLASER PHOTOCOAGULATION Right 02/23/2024   Procedure: 25 GAUGE PARS PLANA VITRECTOMY  WITH LENSECTOMY, MEMBRANE PEEL, AND ENDOLASER;  Surgeon: valdemar Rogue, MD;  Location: [facility] OR;  Service: Ophthalmology;  Laterality: Right;   REMOVAL RETAINED LENS Right 02/23/2024   Procedure: REMOVAL, RETAINED LENS MATTER;  Surgeon: valdemar Rogue, MD;  Location: [facility] OR;  Service: Ophthalmology;  Laterality: Right;   REPAIR OF COMPLEX TRACTION RETINAL DETACHMENT Right 02/23/2024   Procedure: REPAIR, RETINAL DETACHMENT, COMPLEX;  Surgeon: valdemar Rogue, MD;  Location: [facility] OR;  Service: Ophthalmology;  Laterality: Right;   VAGINECTOMY, PARTIAL N/A 06/30/2023   Procedure: SMALL VAGINECTOMY, PARTIAL BIOSPIES;  Surgeon: Viktoria Comer SAUNDERS, MD;  Location: [facility];  Service: Gynecology;   Laterality: N/A;   FAMILY HISTORY Family History  Problem Relation Age of Onset   Hypertension Mother    Diabetes Father    Diabetes Maternal Grandmother    Hypertension Maternal Grandmother    Colon cancer Neg Hx    Colon polyps Neg Hx    Esophageal cancer Neg Hx    Rectal cancer Neg Hx    Stomach cancer Neg Hx    SOCIAL HISTORY Social History   Tobacco Use   Smoking status: Never   Smokeless tobacco: Never  Vaping Use   Vaping status: Never Used  Substance Use Topics   Alcohol use: No    Alcohol/week: 0.0 standard drinks of alcohol   Drug use: Never       OPHTHALMIC EXAM: Base Eye Exam     Visual Acuity (Snellen - Linear)       Right Left   Dist cc 20/HM 20/20 -3    Correction: Glasses         Tonometry (Tonopen, 8:01 AM)       Right Left   Pressure 8 10  Pupils       Dark Light Shape React APD   Right 4 4 Irregular Minimal    Left 3 2 Round Brisk None         Visual Fields (Counting fingers)       Left Right   Restrictions  Total superior temporal, inferior temporal deficiencies         Extraocular Movement       Right Left    Full, Ortho Full, Ortho         Neuro/Psych     Oriented x3: Yes   Mood/Affect: Normal         Dilation     Both eyes: 1.0% Mydriacyl , 2.5% Phenylephrine  @ 8:01 AM           Slit Lamp and Fundus Exam     Slit Lamp Exam       Right Left   Lids/Lashes Dermatochalasis Dermatochalasis - upper lid   Conjunctiva/Sclera White and quiet temporally, nasal pinguecula 1+ Injection, nasal Pinguecula   Cornea Nasal ptergyium extending 2.75mm onto nasal cornea -- less inflamed, peripheral haze, well healed cataract wound, trace Punctate epithelial erosions, 1+ Guttata 1+Punctate epithelial erosions   Anterior Chamber Deep, silicone oil bubbles deep, narrow temporal angle, 1+pigment   Iris Irregular dilation, +PS temporally Round and dilated   Lens 3 piece sulcus IOL with mild nasal displacement  and open PC 2+ Nuclear sclerosis, 2+ Cortical cataract   Anterior Vitreous post vitrectomy, good silicone oil fill, lens remnant gone Mild Vitreous syneresis         Fundus Exam       Right Left   Disc hazy view, 3+pallor, Sharp rim Pink and Sharp   C/D Ratio 0.4 0.6   Macula hazy view, flat under oil Flat, Good foveal reflex, No heme or edema   Vessels Attenuated, mild tortuosity; fibrosis improved Attenuated, Tortuous   Periphery 360 laser, flat under oil, relaxing retinotomy temporally, dense fibrosis temporal periphery improved Bullous schisis cavity IT periphery (9699-9569), good laser changes w/ new outer retinal holes w/ +SRF extending posterior to laser--focal RD; smaller schisis cavity ST periphery from 0100-0200           Refraction     Wearing Rx       Sphere Cylinder Axis   Right +1.25 +1.25 112   Left +2.25 +1.00 101           IMAGING AND PROCEDURES  Imaging and Procedures for @TODAY @  OCT, Retina - OU - Both Eyes       Right Eye Quality was borderline. Central Foveal Thickness: 242. Progression has improved. Findings include no IRF, no SRF, abnormal foveal contour, subretinal hyper-reflective material, epiretinal membrane, pigment epithelial detachment, outer retinal atrophy (Better image quality, retina stably reattached, diffuse atrophy ).   Left Eye Quality was good. Central Foveal Thickness: 297. Progression has worsened. Findings include normal foveal contour, no IRF, subretinal fluid, vitreomacular adhesion (bullous schisis cavity IT periphery w/ new SRF caught on widefield).   Notes *Images captured and stored on drive  Diagnosis / Impression:  OD: Better image quality, retina stably reattached, diffuse atrophy  OS: bullous schisis cavity IT periphery w/ new SRF caught on widefield   Clinical management:  See below  Abbreviations: NFP - Normal foveal profile. CME - cystoid macular edema. PED - pigment epithelial detachment. IRF -  intraretinal fluid. SRF - subretinal fluid. EZ - ellipsoid zone. ERM - epiretinal membrane. ORA - outer retinal atrophy. ORT -  outer retinal tubulation. SRHM - subretinal hyper-reflective material      Repair Retinal Detach, Photocoag - OS - Left Eye       LASER PROCEDURE NOTE  Procedure:  Barrier laser retinopexy using slit lamp laser, LEFT eye   Diagnosis:   Retinoschisis w/ outer retinal holes and +SRF / focal RD, LEFT eye                     New retinal holes and focal RD at 0430 -- at posterior edge of IT schisis cavity                        Secondary retinoschisis cavity in ST quad - 0100-0200  Surgeon: Redell Hans, MD, PhD  Anesthesia: Topical  Informed consent obtained, operative eye marked, and time out performed prior to initiation of laser.   Laser settings:  Lumenis Smart532 laser, slit lamp Lens: Mainster PRP 165 Power: 260 mW Spot size: 400 microns Duration: 30 msec  # spots: 719  Placement of laser: Using a Mainster PRP 165 contact lens at the slit lamp, laser was placed in three+ confluent rows around focal SRF at 0400 and posterior to ST schisis cavity from 0100-0200.  Complications: None.  Patient tolerated the procedure well and received written and verbal post-procedure care information/education.           ASSESSMENT/PLAN:    ICD-10-CM   1. Ocular inflammation  H57.89     2. 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 Traction detachment of right retina  H33.41     4. 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 Left retinal detachment  H33.22 OCT, Retina - OU - Both Eyes    Repair Retinal Detach, Photocoag - OS - Left Eye    8. Retinal hole of left eye  H33.322  Repair Retinal Detach, Photocoag - OS - Left Eye    9. Essential hypertension  I10     10. Hypertensive retinopathy of both eyes  H35.033     11. Combined forms of age-related cataract of both eyes  H25.813     12. Pseudophakia  Z96.1     13. Retained lens matter of right eye  H59.021       1-3. Ocular inflammation and PVR retinal detachment OD - 10.16.24 pt started seeing new floaters and sparks in vision - pt developed progressively worsening vision and vit haze - VA OD improved from LP to CF - pt was on PredForte q1h OD, Prolensa  QID OD, and 40 mg po Prednisone  daily - b-scan 12.04.24 shows significant mobile vit opacities, ?RD - s/p PPV/PFO/EL/FAX/SO OD, 12.05.2024  - undiluted vit samples obtained intra-op for gram stain and culture -- no organisms, but +WBCs - intra-op - clearance of vitreous haze showed extensive fibrosis and PVR tractional detachment -- silicon oil added at end of case - AC was shallow with angle closed -- but now deep s/p CEIOL - exam showed residual intraretinal fibrosis and persistent SRF - POM3 s/p PPV/lensectomy/membrane peel/relaxing retinotomy/silicon oil exchange OD, 05.15.2025             - doing  well - lens material gone; good oil fill             - IOP okay at 08             - cont   PF QID OD- decrease to TID                         ketorolac  QID OD- decrease to TID                         PSO ung bedtime / PRN OD             - post op drop and positioning instructions reviewed              - tylenol /ibuprofen  for pain   - returned to work on Monday, March 19, 2024 -- no straining, no lifting more than 10-15lbs, no pushing or pulling - f/u 4-6 weeks  4,5. CSCR OD -- chronic  - pt reports decreased vision OD since December 2019  - reports significant stressors in life -- work  - denies steroid use - FA (09.30.20) shows focal hyperfluorescent leakage points nasal macula OD -- expansile dot phenotype? - started 50 mg po eplerenone  since  09.30.20  -- pt self d/c in February, re-started in June 2021 - s/p IVA OD #1 (09.01.21), #2 (09.29.21), #3 (11.03.21), #4 (12.03.21) -- IVA resistance - s/p IVE OD #1 (01.20.22), #2 (02.18.22), #3 (04.05.22-sample), #4 (05.23.22), # 5 (06.27.22), #6 (07.28.22), #7 (08.26.22), #8 (09.23.22), #9 (10.21.22), #10 (11.18.22), #11 (12.16.22), #12 (01.20.23), #13 (03.02.23), #14 (04.07.23), #15 (05.12.23), #16 (06.09.23), #17 (07.14.23), #18 (08.08.23), #19 (09.08.23), #20 (10.06.23), #21 (11.06.23), #22 (12.04.23), #23 (01.08.24), #24 (02.12.24), #25 (03.18.24), #26 (04.22.24), #27 (05.20.24) -- IVE resistance ======================================================== - s/p IVV OD #1 (07.02.24), #2 (07.30.24), #3 (08.27.24), #4 (09.26.24), #5 (10.24.24)  - differential includes atypical exudative ARMD - IVV informed consent obtained and signed, 07.02.24  - stop po eplerenone  50 mg daily for now  - monitor  6-8. Retinoschisis OU (OD > OS)  - bullous retinoschisis cavity IT quadrant OD-- 9299-9169 oclock  - bullous retinoschisis cavity IT quadrant OS -- 9669-9569 oclock - widefield Optos imaging (04.22.24) shows mild interval progression of schisis cavities' size and posterior extension in comparison to baseline images -- widefield Optos imaging (08.13.25) OS showed Bullous schisis cavity IT periphery, good laser changes w/ new Outer retinal holes w/ +SRF extending posterior to laser--focal RD; schisis cavity ST periphery  - s/p laser retinopexy OS 08.12.24 -- good laser changes surrounding - s/p laser retinopexy OD (10.07.24) - new schisis cavity found on examination OD (10.28.24) - 1030 periphery -- no RT/RD on scleral depressed exam - s/p laser retinopexy OD to new schisis at 1030 on 10.28.24 - new schisis cavity found on exam and imaging OS 08.15.25 -- ST periphery - also, new outer retinal holes w/ +SRF along posterior edge of IT schisis cavity OS - recommend laser retinopexy OS today, 08.13.25 for  new focal RD/schisis OS - RBA of procedure discussed, questions answered - informed consent obtained and signed - see procedure note  - f/u in 1 wk  9,10. Hypertensive retinopathy OU  - discussed importance of tight BP control  - monitor  11. Mixed Cataract OS - The symptoms of cataract, surgical options, and treatments and risks were discussed with patient. - discussed diagnosis and progression - not yet visually significant - monitor  12,13. Pseudophakia OD  - s/p CE/IOL OD (Dr. Cleopatra, 04.04.25)  - complicated PC rupture and retained lens material in vitreous  - s/p PPV/ lensectomy -- see above  - 3-piece IOL with mild nasal displacement but appears stably scarred in place  - monitor  Ophthalmic Meds Ordered this visit:  Meds ordered this encounter  Medications   prednisoLONE  acetate (PRED FORTE ) 1 % ophthalmic suspension    Sig: Place 1 drop into the left eye 4 (four) times daily.    Dispense:  15 mL    Refill:  0     Return in about 1 week (around 05/31/2024) for POV s/p laser retinopexy OS.  There are no Patient Instructions on file for this visit.  This document serves as a record of services personally performed by Redell JUDITHANN Hans, MD, PhD. It was created on their behalf by Avelina Pereyra, COA an ophthalmic technician. The creation of this record is the provider's dictation and/or activities during the visit.   Electronically signed by: Avelina GORMAN Pereyra, COT  05/27/24  3:30 PM    Redell JUDITHANN Hans, M.D., Ph.D. Diseases & Surgery of the Retina and Vitreous [facility]  I have reviewed the above documentation for accuracy and completeness, and I agree with the above. Redell JUDITHANN Hans, M.D., Ph.D. 05/27/24 3:30 PM   Abbreviations: M myopia (nearsighted); A astigmatism; H hyperopia (farsighted); P presbyopia; Mrx spectacle prescription;  CTL contact lenses; OD right eye; OS left eye; OU both eyes  XT exotropia; ET esotropia; PEK punctate  epithelial keratitis; PEE punctate epithelial erosions; DES dry eye syndrome; MGD meibomian gland dysfunction; ATs artificial tears; PFAT's preservative free artificial tears; NSC nuclear sclerotic cataract; PSC posterior subcapsular cataract; ERM epi-retinal membrane; PVD posterior vitreous detachment; RD retinal detachment; DM diabetes mellitus; DR diabetic retinopathy; NPDR non-proliferative diabetic retinopathy; PDR proliferative diabetic retinopathy; CSME clinically significant macular edema; DME diabetic macular edema; dbh dot blot hemorrhages; CWS cotton wool spot; POAG primary open angle glaucoma; C/D cup-to-disc ratio; HVF humphrey visual field; GVF goldmann visual field; OCT optical coherence tomography; IOP intraocular pressure; BRVO Branch retinal vein occlusion; CRVO central retinal vein occlusion; CRAO central retinal artery occlusion; BRAO branch retinal artery occlusion; RT retinal tear; SB scleral buckle; PPV pars plana vitrectomy; VH Vitreous hemorrhage; PRP panretinal laser photocoagulation; IVK intravitreal kenalog ; VMT vitreomacular traction; MH Macular hole;  NVD neovascularization of the disc; NVE neovascularization elsewhere; AREDS age related eye disease study; ARMD age related macular degeneration; POAG primary open angle glaucoma; EBMD epithelial/anterior basement membrane dystrophy; ACIOL anterior chamber intraocular lens; IOL intraocular lens; PCIOL posterior chamber intraocular lens; Phaco/IOL phacoemulsification with intraocular lens placement; PRK photorefractive keratectomy; LASIK laser assisted in situ keratomileusis; HTN hypertension; DM diabetes mellitus; COPD chronic obstructive pulmonary disease

## 2024-05-24 ENCOUNTER — Ambulatory Visit (INDEPENDENT_AMBULATORY_CARE_PROVIDER_SITE_OTHER): Admitting: Ophthalmology

## 2024-05-24 ENCOUNTER — Encounter (INDEPENDENT_AMBULATORY_CARE_PROVIDER_SITE_OTHER): Payer: Self-pay | Admitting: Ophthalmology

## 2024-05-24 DIAGNOSIS — H33103 Unspecified retinoschisis, bilateral: Secondary | ICD-10-CM | POA: Diagnosis not present

## 2024-05-24 DIAGNOSIS — H3341 Traction detachment of retina, right eye: Secondary | ICD-10-CM

## 2024-05-24 DIAGNOSIS — H59021 Cataract (lens) fragments in eye following cataract surgery, right eye: Secondary | ICD-10-CM

## 2024-05-24 DIAGNOSIS — H35033 Hypertensive retinopathy, bilateral: Secondary | ICD-10-CM

## 2024-05-24 DIAGNOSIS — H5789 Other specified disorders of eye and adnexa: Secondary | ICD-10-CM

## 2024-05-24 DIAGNOSIS — H353211 Exudative age-related macular degeneration, right eye, with active choroidal neovascularization: Secondary | ICD-10-CM

## 2024-05-24 DIAGNOSIS — H3521 Other non-diabetic proliferative retinopathy, right eye: Secondary | ICD-10-CM

## 2024-05-24 DIAGNOSIS — H33322 Round hole, left eye: Secondary | ICD-10-CM

## 2024-05-24 DIAGNOSIS — H25813 Combined forms of age-related cataract, bilateral: Secondary | ICD-10-CM

## 2024-05-24 DIAGNOSIS — Z961 Presence of intraocular lens: Secondary | ICD-10-CM

## 2024-05-24 DIAGNOSIS — I10 Essential (primary) hypertension: Secondary | ICD-10-CM

## 2024-05-24 DIAGNOSIS — H3322 Serous retinal detachment, left eye: Secondary | ICD-10-CM

## 2024-05-24 DIAGNOSIS — H35711 Central serous chorioretinopathy, right eye: Secondary | ICD-10-CM | POA: Diagnosis not present

## 2024-05-24 MED ORDER — PREDNISOLONE ACETATE 1 % OP SUSP: 1.0000 [drp] | Freq: Four times a day (QID) | OPHTHALMIC | 0 refills | Status: DC

## 2024-05-26 ENCOUNTER — Emergency Department (HOSPITAL_COMMUNITY)

## 2024-05-26 ENCOUNTER — Encounter (HOSPITAL_COMMUNITY): Payer: Self-pay | Admitting: *Deleted

## 2024-05-26 ENCOUNTER — Inpatient Hospital Stay (HOSPITAL_COMMUNITY)
Admission: EM | Admit: 2024-05-26 | Discharge: 2024-05-29 | DRG: 854 | Disposition: A | Discharge status: Home / Self Care | Attending: Family Medicine | Admitting: Family Medicine

## 2024-05-26 ENCOUNTER — Other Ambulatory Visit: Payer: Self-pay

## 2024-05-26 ENCOUNTER — Observation Stay (HOSPITAL_COMMUNITY): Admitting: Anesthesiology

## 2024-05-26 ENCOUNTER — Observation Stay (HOSPITAL_COMMUNITY)

## 2024-05-26 ENCOUNTER — Encounter (HOSPITAL_COMMUNITY)
Admission: EM | Disposition: A | Discharge status: Home / Self Care | Payer: Self-pay | Source: Home / Self Care | Attending: Family Medicine

## 2024-05-26 DIAGNOSIS — Z833 Family history of diabetes mellitus: Secondary | ICD-10-CM

## 2024-05-26 DIAGNOSIS — N200 Calculus of kidney: Secondary | ICD-10-CM | POA: Diagnosis not present

## 2024-05-26 DIAGNOSIS — R6520 Severe sepsis without septic shock: Secondary | ICD-10-CM | POA: Diagnosis present

## 2024-05-26 DIAGNOSIS — N390 Urinary tract infection, site not specified: Secondary | ICD-10-CM | POA: Diagnosis present

## 2024-05-26 DIAGNOSIS — N202 Calculus of kidney with calculus of ureter: Secondary | ICD-10-CM | POA: Diagnosis present

## 2024-05-26 DIAGNOSIS — N139 Obstructive and reflux uropathy, unspecified: Secondary | ICD-10-CM | POA: Insufficient documentation

## 2024-05-26 DIAGNOSIS — F32A Depression, unspecified: Secondary | ICD-10-CM | POA: Diagnosis present

## 2024-05-26 DIAGNOSIS — Z79899 Other long term (current) drug therapy: Secondary | ICD-10-CM

## 2024-05-26 DIAGNOSIS — Z87410 Personal history of cervical dysplasia: Secondary | ICD-10-CM

## 2024-05-26 DIAGNOSIS — A4151 Sepsis due to Escherichia coli [E. coli]: Principal | ICD-10-CM | POA: Diagnosis present

## 2024-05-26 DIAGNOSIS — N201 Calculus of ureter: Secondary | ICD-10-CM

## 2024-05-26 DIAGNOSIS — D259 Leiomyoma of uterus, unspecified: Secondary | ICD-10-CM | POA: Diagnosis present

## 2024-05-26 DIAGNOSIS — E785 Hyperlipidemia, unspecified: Secondary | ICD-10-CM | POA: Diagnosis present

## 2024-05-26 DIAGNOSIS — Z860101 Personal history of adenomatous and serrated colon polyps: Secondary | ICD-10-CM

## 2024-05-26 DIAGNOSIS — H269 Unspecified cataract: Secondary | ICD-10-CM | POA: Diagnosis present

## 2024-05-26 DIAGNOSIS — E876 Hypokalemia: Secondary | ICD-10-CM | POA: Diagnosis present

## 2024-05-26 DIAGNOSIS — R109 Unspecified abdominal pain: Secondary | ICD-10-CM | POA: Diagnosis not present

## 2024-05-26 DIAGNOSIS — I10 Essential (primary) hypertension: Secondary | ICD-10-CM | POA: Diagnosis not present

## 2024-05-26 DIAGNOSIS — R112 Nausea with vomiting, unspecified: Secondary | ICD-10-CM

## 2024-05-26 DIAGNOSIS — H353290 Exudative age-related macular degeneration, unspecified eye, stage unspecified: Secondary | ICD-10-CM | POA: Diagnosis present

## 2024-05-26 DIAGNOSIS — K5730 Diverticulosis of large intestine without perforation or abscess without bleeding: Secondary | ICD-10-CM | POA: Diagnosis present

## 2024-05-26 DIAGNOSIS — N136 Pyonephrosis: Secondary | ICD-10-CM | POA: Diagnosis present

## 2024-05-26 DIAGNOSIS — H35033 Hypertensive retinopathy, bilateral: Secondary | ICD-10-CM | POA: Diagnosis present

## 2024-05-26 DIAGNOSIS — R509 Fever, unspecified: Secondary | ICD-10-CM

## 2024-05-26 DIAGNOSIS — Z8249 Family history of ischemic heart disease and other diseases of the circulatory system: Secondary | ICD-10-CM

## 2024-05-26 DIAGNOSIS — F419 Anxiety disorder, unspecified: Secondary | ICD-10-CM | POA: Diagnosis present

## 2024-05-26 DIAGNOSIS — E860 Dehydration: Secondary | ICD-10-CM | POA: Diagnosis present

## 2024-05-26 HISTORY — PX: CYSTOSCOPY W/ URETERAL STENT PLACEMENT: SHX1429

## 2024-05-26 LAB — I-STAT CG4 LACTIC ACID, ED: Lactic Acid, Venous: 4.9 mmol/L (ref 0.5–1.9)

## 2024-05-26 LAB — CBC
HCT: 39.7 % (ref 36.0–46.0)
Hemoglobin: 12.7 g/dL (ref 12.0–15.0)
MCH: 28.4 pg (ref 26.0–34.0)
MCHC: 32 g/dL (ref 30.0–36.0)
MCV: 88.8 fL (ref 80.0–100.0)
Platelets: 551 K/uL — ABNORMAL HIGH (ref 150–400)
RBC: 4.47 MIL/uL (ref 3.87–5.11)
RDW: 13.5 % (ref 11.5–15.5)
WBC: 10.8 K/uL — ABNORMAL HIGH (ref 4.0–10.5)
nRBC: 0 % (ref 0.0–0.2)

## 2024-05-26 LAB — COMPREHENSIVE METABOLIC PANEL WITH GFR
ALT: 35 U/L (ref 0–44)
AST: 31 U/L (ref 15–41)
Albumin: 3.5 g/dL (ref 3.5–5.0)
Alkaline Phosphatase: 108 U/L (ref 38–126)
Anion gap: 13 (ref 5–15)
BUN: 21 mg/dL — ABNORMAL HIGH (ref 6–20)
CO2: 20 mmol/L — ABNORMAL LOW (ref 22–32)
Calcium: 9.5 mg/dL (ref 8.9–10.3)
Chloride: 104 mmol/L (ref 98–111)
Creatinine, Ser: 0.99 mg/dL (ref 0.44–1.00)
GFR, Estimated: >60 mL/min (ref >60)
Glucose, Bld: 148 mg/dL — ABNORMAL HIGH (ref 70–99)
Potassium: 3.1 mmol/L — ABNORMAL LOW (ref 3.5–5.1)
Sodium: 137 mmol/L (ref 135–145)
Total Bilirubin: 1 mg/dL (ref 0.0–1.2)
Total Protein: 7.7 g/dL (ref 6.5–8.1)

## 2024-05-26 LAB — URINALYSIS, ROUTINE W REFLEX MICROSCOPIC
Bilirubin Urine: NEGATIVE
Glucose, UA: NEGATIVE mg/dL
Ketones, ur: NEGATIVE mg/dL
Nitrite: NEGATIVE
Protein, ur: 100 mg/dL — AB
RBC / HPF: >50 RBC/hpf (ref 0–5)
Specific Gravity, Urine: 1.027 (ref 1.005–1.030)
WBC, UA: >50 WBC/hpf (ref 0–5)
pH: 6 (ref 5.0–8.0)

## 2024-05-26 LAB — HIV ANTIBODY (ROUTINE TESTING W REFLEX): HIV Screen 4th Generation wRfx: NONREACTIVE

## 2024-05-26 LAB — LIPASE, BLOOD: Lipase: 32 U/L (ref 11–51)

## 2024-05-26 LAB — MAGNESIUM: Magnesium: 1.2 mg/dL — ABNORMAL LOW (ref 1.7–2.4)

## 2024-05-26 SURGERY — CYSTOSCOPY, WITH RETROGRADE PYELOGRAM AND URETERAL STENT INSERTION
Anesthesia: General | Laterality: Left

## 2024-05-26 MED ORDER — SODIUM CHLORIDE 0.9 % IR SOLN
Status: DC | PRN
  Administered 2024-05-26: 3000 mL via INTRAVESICAL

## 2024-05-26 MED ORDER — HYDROCODONE-ACETAMINOPHEN 5-325 MG PO TABS
1.0000 | ORAL_TABLET | Freq: Four times a day (QID) | ORAL | Status: DC | PRN
  Administered 2024-05-27 – 2024-05-28 (×2): 1 via ORAL
  Filled 2024-05-26 (×2): qty 1

## 2024-05-26 MED ORDER — HYDROMORPHONE HCL 1 MG/ML IJ SOLN
0.5000 mg | INTRAMUSCULAR | Status: DC | PRN
  Administered 2024-05-27: 1 mg via INTRAVENOUS
  Filled 2024-05-26: qty 1

## 2024-05-26 MED ORDER — LACTATED RINGERS IV BOLUS
1000.0000 mL | Freq: Once | INTRAVENOUS | Status: AC
  Administered 2024-05-26: 1000 mL via INTRAVENOUS

## 2024-05-26 MED ORDER — LIDOCAINE 2% (20 MG/ML) 5 ML SYRINGE
INTRAMUSCULAR | Status: DC | PRN
  Administered 2024-05-26: 40 mg via INTRAVENOUS

## 2024-05-26 MED ORDER — MIDAZOLAM HCL 2 MG/2ML IJ SOLN
INTRAMUSCULAR | Status: AC
  Filled 2024-05-26: qty 2

## 2024-05-26 MED ORDER — MIDAZOLAM HCL 2 MG/2ML IJ SOLN
INTRAMUSCULAR | Status: DC | PRN
  Administered 2024-05-26: 2 mg via INTRAVENOUS

## 2024-05-26 MED ORDER — SODIUM CHLORIDE 0.9% FLUSH
3.0000 mL | Freq: Two times a day (BID) | INTRAVENOUS | Status: DC
  Administered 2024-05-26 – 2024-05-29 (×5): 3 mL via INTRAVENOUS

## 2024-05-26 MED ORDER — PROPOFOL 10 MG/ML IV BOLUS
INTRAVENOUS | Status: DC | PRN
  Administered 2024-05-26: 150 mg via INTRAVENOUS

## 2024-05-26 MED ORDER — OXYCODONE HCL 5 MG PO TABS: 5.0000 mg | ORAL_TABLET | Freq: Once | ORAL | Status: DC | PRN

## 2024-05-26 MED ORDER — SODIUM CHLORIDE 0.9 % IV SOLN
2.0000 g | INTRAVENOUS | Status: DC
  Administered 2024-05-27 – 2024-05-29 (×3): 2 g via INTRAVENOUS
  Filled 2024-05-26 (×3): qty 20

## 2024-05-26 MED ORDER — ACETAMINOPHEN 325 MG PO TABS
650.0000 mg | ORAL_TABLET | Freq: Four times a day (QID) | ORAL | Status: DC | PRN
  Administered 2024-05-29: 650 mg via ORAL
  Filled 2024-05-26: qty 2

## 2024-05-26 MED ORDER — HYDROCHLOROTHIAZIDE 25 MG PO TABS
25.0000 mg | ORAL_TABLET | Freq: Every day | ORAL | Status: DC
  Administered 2024-05-27: 25 mg via ORAL
  Filled 2024-05-26: qty 1

## 2024-05-26 MED ORDER — IOHEXOL 350 MG/ML SOLN
75.0000 mL | Freq: Once | INTRAVENOUS | Status: AC | PRN
  Administered 2024-05-26: 75 mL via INTRAVENOUS

## 2024-05-26 MED ORDER — ONDANSETRON 4 MG PO TBDP: 4.0000 mg | ORAL_TABLET | Freq: Once | ORAL | Status: DC | PRN

## 2024-05-26 MED ORDER — FENTANYL CITRATE (PF) 100 MCG/2ML IJ SOLN: 25.0000 ug | INTRAMUSCULAR | Status: DC | PRN

## 2024-05-26 MED ORDER — ACETAMINOPHEN 10 MG/ML IV SOLN
1000.0000 mg | Freq: Once | INTRAVENOUS | Status: DC | PRN
Start: 2024-05-26 — End: 2024-05-26
  Administered 2024-05-26: 1000 mg via INTRAVENOUS

## 2024-05-26 MED ORDER — FENTANYL CITRATE (PF) 250 MCG/5ML IJ SOLN
INTRAMUSCULAR | Status: AC
  Filled 2024-05-26: qty 5

## 2024-05-26 MED ORDER — MORPHINE SULFATE (PF) 4 MG/ML IV SOLN
4.0000 mg | Freq: Once | INTRAVENOUS | Status: AC
  Administered 2024-05-26: 4 mg via INTRAVENOUS
  Filled 2024-05-26: qty 1

## 2024-05-26 MED ORDER — DEXAMETHASONE SODIUM PHOSPHATE 10 MG/ML IJ SOLN
INTRAMUSCULAR | Status: AC
  Filled 2024-05-26: qty 1

## 2024-05-26 MED ORDER — LIDOCAINE 2% (20 MG/ML) 5 ML SYRINGE
INTRAMUSCULAR | Status: AC
  Filled 2024-05-26: qty 5

## 2024-05-26 MED ORDER — VASOPRESSIN 20 UNIT/ML IV SOLN
INTRAVENOUS | Status: AC
  Filled 2024-05-26: qty 1

## 2024-05-26 MED ORDER — ONDANSETRON HCL 4 MG/2ML IJ SOLN
4.0000 mg | Freq: Once | INTRAMUSCULAR | Status: AC
  Administered 2024-05-26: 4 mg via INTRAVENOUS
  Filled 2024-05-26: qty 2

## 2024-05-26 MED ORDER — SODIUM CHLORIDE 0.9 % IV SOLN: INTRAVENOUS | Status: AC

## 2024-05-26 MED ORDER — IOHEXOL 300 MG/ML  SOLN
INTRAMUSCULAR | Status: DC | PRN
  Administered 2024-05-26: 20 mL via URETHRAL

## 2024-05-26 MED ORDER — ACETAMINOPHEN 10 MG/ML IV SOLN
INTRAVENOUS | Status: AC
  Filled 2024-05-26: qty 100

## 2024-05-26 MED ORDER — ONDANSETRON HCL 4 MG/2ML IJ SOLN: 4.0000 mg | Freq: Four times a day (QID) | INTRAMUSCULAR | Status: DC | PRN

## 2024-05-26 MED ORDER — PHENYLEPHRINE 80 MCG/ML (10ML) SYRINGE FOR IV PUSH (FOR BLOOD PRESSURE SUPPORT)
PREFILLED_SYRINGE | INTRAVENOUS | Status: DC | PRN
  Administered 2024-05-26 (×3): 160 ug via INTRAVENOUS
  Administered 2024-05-26: 80 ug via INTRAVENOUS

## 2024-05-26 MED ORDER — LISINOPRIL 20 MG PO TABS
20.0000 mg | ORAL_TABLET | Freq: Every day | ORAL | Status: DC
  Administered 2024-05-27: 20 mg via ORAL
  Filled 2024-05-26: qty 1

## 2024-05-26 MED ORDER — ONDANSETRON HCL 4 MG/2ML IJ SOLN
INTRAMUSCULAR | Status: AC
  Filled 2024-05-26: qty 2

## 2024-05-26 MED ORDER — POLYETHYLENE GLYCOL 3350 17 G PO PACK: 17.0000 g | PACK | Freq: Every day | ORAL | Status: DC | PRN

## 2024-05-26 MED ORDER — ACETAMINOPHEN 650 MG RE SUPP: 650.0000 mg | Freq: Four times a day (QID) | RECTAL | Status: DC | PRN

## 2024-05-26 MED ORDER — DEXAMETHASONE SODIUM PHOSPHATE 10 MG/ML IJ SOLN
INTRAMUSCULAR | Status: DC | PRN
  Administered 2024-05-26: 10 mg via INTRAVENOUS

## 2024-05-26 MED ORDER — PHENYLEPHRINE 80 MCG/ML (10ML) SYRINGE FOR IV PUSH (FOR BLOOD PRESSURE SUPPORT)
PREFILLED_SYRINGE | INTRAVENOUS | Status: AC
  Filled 2024-05-26: qty 10

## 2024-05-26 MED ORDER — SODIUM CHLORIDE 0.9 % IV SOLN
2.0000 g | Freq: Once | INTRAVENOUS | Status: AC
  Administered 2024-05-26: 2 g via INTRAVENOUS
  Filled 2024-05-26: qty 20

## 2024-05-26 MED ORDER — POTASSIUM CHLORIDE 10 MEQ/100ML IV SOLN
10.0000 meq | INTRAVENOUS | Status: AC
  Administered 2024-05-26 (×2): 10 meq via INTRAVENOUS
  Filled 2024-05-26 (×2): qty 100

## 2024-05-26 MED ORDER — PROPOFOL 10 MG/ML IV BOLUS
INTRAVENOUS | Status: AC
  Filled 2024-05-26: qty 20

## 2024-05-26 MED ORDER — ONDANSETRON HCL 4 MG/2ML IJ SOLN
INTRAMUSCULAR | Status: DC | PRN
  Administered 2024-05-26: 4 mg via INTRAVENOUS

## 2024-05-26 MED ORDER — OXYCODONE HCL 5 MG/5ML PO SOLN: 5.0000 mg | Freq: Once | ORAL | Status: DC | PRN

## 2024-05-26 MED ORDER — FENTANYL CITRATE (PF) 250 MCG/5ML IJ SOLN
INTRAMUSCULAR | Status: DC | PRN
  Administered 2024-05-26: 50 ug via INTRAVENOUS
  Administered 2024-05-26: 100 ug via INTRAVENOUS
  Administered 2024-05-26: 50 ug via INTRAVENOUS

## 2024-05-26 SURGICAL SUPPLY — 22 items
BAG DRAIN URO-CYSTO SKYTR STRL (DRAIN) ×1 IMPLANT
BASKET ZERO TIP NITINOL 2.4FR (BASKET) IMPLANT
BENZOIN TINCTURE PRP APPL 2/3 (GAUZE/BANDAGES/DRESSINGS) IMPLANT
CATH URETL OPEN 5X70 (CATHETERS) IMPLANT
EXTRACTOR STONE 1.7FRX115CM (UROLOGICAL SUPPLIES) IMPLANT
GLOVE BIO SURGEON STRL SZ8 (GLOVE) ×1 IMPLANT
GOWN STRL SURGICAL XL XLNG (GOWN DISPOSABLE) ×1 IMPLANT
GUIDEWIRE STR DUAL SENSOR (WIRE) IMPLANT
GUIDEWIRE ZIPWRE .038 STRAIGHT (WIRE) ×1 IMPLANT
KIT TURNOVER KIT B (KITS) ×1 IMPLANT
LASER FIB FLEXIVA PULSE ID 365 (Laser) IMPLANT
MANIFOLD NEPTUNE II (INSTRUMENTS) ×1 IMPLANT
NS IRRIG 500ML POUR BTL (IV SOLUTION) ×1 IMPLANT
PACK CYSTO (CUSTOM PROCEDURE TRAY) ×1 IMPLANT
SHEATH NAVIGATOR HD 11/13X28 (SHEATH) IMPLANT
SHEATH NAVIGATOR HD 11/13X36 (SHEATH) IMPLANT
SHEATH NAVIGATOR HD 12/14X46 (SHEATH) IMPLANT
SLEEVE SCD COMPRESS KNEE MED (STOCKING) ×1 IMPLANT
STRIP CLOSURE SKIN 1/2X4 (GAUZE/BANDAGES/DRESSINGS) IMPLANT
SYR 10ML LL (SYRINGE) ×1 IMPLANT
TUBE CONNECTING 12X1/4 (SUCTIONS) ×1 IMPLANT
TUBING UROLOGY SET (TUBING) ×1 IMPLANT

## 2024-05-26 NOTE — Anesthesia Preprocedure Evaluation (Signed)
 Anesthesia Evaluation  Patient identified by MRN, date of birth, ID band Patient awake    Reviewed: Allergy & Precautions, H&P , NPO status , Patient's Chart, lab work & pertinent test results  Airway Mallampati: II   Neck ROM: full    Dental   Pulmonary neg pulmonary ROS   breath sounds clear to auscultation       Cardiovascular hypertension,  Rhythm:regular Rate:Normal     Neuro/Psych  Headaches PSYCHIATRIC DISORDERS Anxiety Depression       GI/Hepatic   Endo/Other    Renal/GU      Musculoskeletal   Abdominal   Peds  Hematology   Anesthesia Other Findings   Reproductive/Obstetrics                             Anesthesia Physical Anesthesia Plan  ASA: 2  Anesthesia Plan: General   Post-op Pain Management:    Induction: Intravenous  PONV Risk Score and Plan: 3 and Ondansetron , Dexamethasone , Midazolam  and Treatment may vary due to age or medical condition  Airway Management Planned: LMA  Additional Equipment:   Intra-op Plan:   Post-operative Plan: Extubation in OR  Informed Consent: I have reviewed the patients History and Physical, chart, labs and discussed the procedure including the risks, benefits and alternatives for the proposed anesthesia with the patient or authorized representative who has indicated his/her understanding and acceptance.     Dental advisory given  Plan Discussed with: CRNA, Anesthesiologist and Surgeon  Anesthesia Plan Comments:        Anesthesia Quick Evaluation

## 2024-05-26 NOTE — Transfer of Care (Signed)
 Immediate Anesthesia Transfer of Care Note  Patient: [name]  Procedure(s) Performed: CYSTOSCOPY, WITH RETROGRADE PYELOGRAM AND URETERAL STENT INSERTION (Left)  Patient Location: PACU  Anesthesia Type:General  Level of Consciousness: awake and alert   Airway & Oxygen Therapy: Patient Spontanous Breathing  Post-op Assessment: Report given to RN, Post -op Vital signs reviewed and stable, Patient moving all extremities, and Patient moving all extremities X 4  Post vital signs: Reviewed and stable  Last Vitals:  Vitals Value Taken Time  BP 118/64 05/26/24 16:50  Temp 98.5   Pulse 123 05/26/24 16:52  Resp 24 05/26/24 16:52  SpO2 91 % 05/26/24 16:52  Vitals shown include unfiled device data.  Last Pain:  Vitals:   05/26/24 1323  TempSrc: Oral  PainSc:          Complications: There were no known notable events for this encounter.

## 2024-05-26 NOTE — Consult Note (Signed)
 Urology Consult   Physician requesting consult: Franky Gaul, MD  Reason for consult: left ureteral stone with fever  History of Present Illness: [name] Lillionna Nabi is a 53 y.o. female evaluated in the emergency room with a 24-hour history of worsening left-sided flank pain associated with nausea/vomiting and subjective fevers.  CT stone study from today revealed a 6 mm left proximal ureteral stone associated with moderate hydronephrosis.  She has a temperature of 100.66 F in the emergency room.  Spanish is her first language and she has family at bedside to interpret.  The patient denies a history of voiding or storage urinary symptoms, hematuria, UTIs, STDs, urolithiasis, GU malignancy/trauma/surgery.  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   followed by dr zamora;  pt takes  inspra  for excess fluid in retina,   Sciatic nerve injury    Seasonal allergies    Vaginal dysplasia    Wears glasses     Past Surgical History:  Procedure Laterality Date   CERVICAL CONIZATION W/BX N/A 06/30/2023   Procedure: CONIZATION CERVIX WITH BIOPSY, ENDOCERVICAL CURETTAGE;  Surgeon: Viktoria Comer SAUNDERS, MD;  Location: [facility] [place];  Service: Gynecology;  Laterality: N/A;   CO2 LASER APPLICATION N/A 06/30/2023   Procedure: CO2 LASER TO THE VAGINA;  Surgeon: Viktoria Comer SAUNDERS, MD;  Location: [facility];  Service: Gynecology;  Laterality: N/A;   COLONOSCOPY  12/14/2022   HYSTEROSCOPY WITH RESECTOSCOPE  11/25/2006   @[facility]  by dr jinny. winfred;   Resectoscopic myomectomy and polypectomy   INJECTION OF SILICONE OIL Right 09/15/2023    Procedure: INJECTION OF SILICONE OIL;  Surgeon: Valdemar Rogue, MD;  Location: [facility] OR;  Service: Ophthalmology;  Laterality: Right;   INJECTION OF SILICONE OIL Right 02/23/2024   Procedure: INJECTION, SILICONE OIL EXCHANGE;  Surgeon: Valdemar Rogue, MD;  Location: [facility] OR;  Service: Ophthalmology;  Laterality: Right;   PARS PLANA VITRECTOMY Right 09/15/2023   Procedure: PARS PLANA VITRECTOMY WITH 25 GAUGE;  Surgeon: Valdemar Rogue, MD;  Location: [facility] OR;  Service: Ophthalmology;  Laterality: Right;   PARS PLANA VITRECTOMY W/ FOCAL ENDOLASER PHOTOCOAGULATION Right 02/23/2024   Procedure: 25 GAUGE PARS PLANA VITRECTOMY  WITH LENSECTOMY, MEMBRANE PEEL, AND ENDOLASER;  Surgeon: Valdemar Rogue, MD;  Location: [facility] OR;  Service: Ophthalmology;  Laterality: Right;   REMOVAL RETAINED LENS Right 02/23/2024   Procedure: REMOVAL, RETAINED LENS MATTER;  Surgeon: Valdemar Rogue, MD;  Location: [facility] OR;  Service: Ophthalmology;  Laterality: Right;   REPAIR OF COMPLEX TRACTION RETINAL DETACHMENT Right 02/23/2024   Procedure: REPAIR, RETINAL DETACHMENT, COMPLEX;  Surgeon: Valdemar Rogue, MD;  Location: [facility] OR;  Service: Ophthalmology;  Laterality: Right;   VAGINECTOMY, PARTIAL N/A 06/30/2023   Procedure: SMALL VAGINECTOMY, PARTIAL BIOSPIES;  Surgeon: Viktoria Comer SAUNDERS, MD;  Location: [facility];  Service: Gynecology;  Laterality: N/A;    Current Hospital Medications:  Home Meds:  No outpatient medications have been marked as taking for the 05/26/24 encounter [facility] Encounter).    Scheduled Meds: Continuous Infusions:  cefTRIAXone  (ROCEPHIN )  IV     potassium chloride  10  mEq (05/26/24 1322)   PRN Meds:.ondansetron   Allergies:  Allergies  Allergen Reactions   Pollen Extract Itching    POLLEN EXTRACTS    Family History  Problem Relation Age of Onset   Hypertension Mother    Diabetes Father    Diabetes Maternal Grandmother    Hypertension Maternal Grandmother    Colon cancer Neg Hx    Colon  polyps Neg Hx    Esophageal cancer Neg Hx    Rectal cancer Neg Hx    Stomach cancer Neg Hx     Social History:  reports that she has never smoked. She has never used smokeless tobacco. She reports that she does not drink alcohol and does not use drugs.  ROS: A complete review of systems was performed.  All systems are negative except for pertinent findings as noted.  Physical Exam:  Vital signs in last 24 hours: Temp:  [99.9 F (37.7 C)-100.6 F (38.1 C)] 100.6 F (38.1 C) (08/16 1323) Pulse Rate:  [109-118] 118 (08/16 1330) Resp:  [20-23] 21 (08/16 1330) BP: (108-115)/(62-68) 108/68 (08/16 1330) SpO2:  [98 %-100 %] 98 % (08/16 1330) Weight:  [70.3 kg] 70.3 kg (08/16 0910) Constitutional:  Alert and oriented, No acute distress Psychiatric: Normal mood and affect  Laboratory Data:  Recent Labs    05/26/24 0913  WBC 10.8*  HGB 12.7  HCT 39.7  PLT 551*    Recent Labs    05/26/24 0913  NA 137  K 3.1*  CL 104  GLUCOSE 148*  BUN 21*  CALCIUM 9.5  CREATININE 0.99     Results for orders placed or performed during the hospital encounter of 05/26/24 (from the past 24 hours)  Lipase, blood     Status: None   Collection Time: 05/26/24  9:13 AM  Result Value Ref Range   Lipase 32 11 - 51 U/L  Comprehensive metabolic panel     Status: Abnormal   Collection Time: 05/26/24  9:13 AM  Result Value Ref Range   Sodium 137 135 - 145 mmol/L   Potassium 3.1 (L) 3.5 - 5.1 mmol/L   Chloride 104 98 - 111 mmol/L   CO2 20 (L) 22 - 32 mmol/L   Glucose, Bld 148 (H) 70 - 99 mg/dL   BUN 21 (H) 6 - 20 mg/dL   Creatinine, Ser 9.00 0.44 - 1.00 mg/dL   Calcium 9.5 8.9 - 89.6 mg/dL   Total Protein 7.7 6.5 - 8.1 g/dL   Albumin 3.5 3.5 - 5.0 g/dL   AST 31 15 - 41 U/L   ALT 35 0 - 44 U/L   Alkaline Phosphatase 108 38 - 126 U/L   Total Bilirubin 1.0 0.0 - 1.2 mg/dL   GFR, Estimated >39 >39 mL/min   Anion gap 13 5 - 15  CBC     Status: Abnormal   Collection Time: 05/26/24  9:13 AM   Result Value Ref Range   WBC 10.8 (H) 4.0 - 10.5 K/uL   RBC 4.47 3.87 - 5.11 MIL/uL   Hemoglobin 12.7 12.0 - 15.0 g/dL   HCT 60.2 63.9 - 53.9 %   MCV 88.8 80.0 - 100.0 fL   MCH 28.4 26.0 - 34.0 pg   MCHC 32.0 30.0 - 36.0 g/dL   RDW 86.4 88.4 - 84.4 %   Platelets 551 (H) 150 - 400 K/uL   nRBC 0.0 0.0 - 0.2 %  Urinalysis, Routine w reflex microscopic -Urine, Clean Catch     Status: Abnormal  Collection Time: 05/26/24  9:13 AM  Result Value Ref Range   Color, Urine AMBER (A) YELLOW   APPearance CLOUDY (A) CLEAR   Specific Gravity, Urine 1.027 1.005 - 1.030   pH 6.0 5.0 - 8.0   Glucose, UA NEGATIVE NEGATIVE mg/dL   Hgb urine dipstick MODERATE (A) NEGATIVE   Bilirubin Urine NEGATIVE NEGATIVE   Ketones, ur NEGATIVE NEGATIVE mg/dL   Protein, ur 899 (A) NEGATIVE mg/dL   Nitrite NEGATIVE NEGATIVE   Leukocytes,Ua MODERATE (A) NEGATIVE   RBC / HPF >50 0 - 5 RBC/hpf   WBC, UA >50 0 - 5 WBC/hpf   Bacteria, UA RARE (A) NONE SEEN   Squamous Epithelial / HPF 11-20 0 - 5 /HPF   Mucus PRESENT    No results found for this or any previous visit (from the past 240 hours).  Renal Function: Recent Labs    05/26/24 0913  CREATININE 0.99   Estimated Creatinine Clearance: 61.7 mL/min (by C-G formula based on SCr of 0.99 mg/dL).  Radiologic Imaging: CT ABDOMEN PELVIS W CONTRAST Result Date: 05/26/2024 CLINICAL DATA:  Lower abdominal pain, vomiting, diarrhea, chills EXAM: CT ABDOMEN AND PELVIS WITH CONTRAST TECHNIQUE: Multidetector CT imaging of the abdomen and pelvis was performed using the standard protocol following bolus administration of intravenous contrast. RADIATION DOSE REDUCTION: This exam was performed according to the departmental dose-optimization program which includes automated exposure control, adjustment of the mA and/or kV according to patient size and/or use of iterative reconstruction technique. CONTRAST:  75mL OMNIPAQUE  IOHEXOL  350 MG/ML SOLN COMPARISON:  None Available.  FINDINGS: Lower chest: No acute pleural or parenchymal lung disease. Hepatobiliary: Multiple simple appearing hepatic cysts. Liver is otherwise unremarkable. No evidence of cholelithiasis or cholecystitis. No biliary duct dilation. Pancreas: Unremarkable. No pancreatic ductal dilatation or surrounding inflammatory changes. Spleen: Normal in size without focal abnormality. Adrenals/Urinary Tract: There is an obstructing 6 mm calculus within the proximal left ureter at the UPJ, reference image 42/3. There is moderate left hydronephrosis with severe left renal edema and perinephric fat stranding compatible with high-grade obstruction. Superimposed infection cannot be excluded. The right kidney is unremarkable. No right-sided calculi or obstructive uropathy. The adrenals and bladder appear unremarkable. Stomach/Bowel: No bowel obstruction or ileus. Normal appendix right lower quadrant. Scattered colonic diverticulosis without diverticulitis. Small hiatal hernia. No bowel wall thickening or inflammatory change. Vascular/Lymphatic: No significant vascular findings are present. No enlarged abdominal or pelvic lymph nodes. Reproductive: Heterogeneous appearance of the uterus consistent with multiple fibroids. No adnexal masses. Other: No free fluid or free intraperitoneal gas. No abdominal wall hernia. Musculoskeletal: No acute or destructive bony abnormalities. Reconstructed images demonstrate no additional findings. IMPRESSION: 1. Obstructing 6 mm left UPJ calculus, with high-grade left-sided obstructive uropathy. Superimposed infection cannot be excluded. 2. Scattered colonic diverticulosis without diverticulitis. 3. Fibroid uterus. 4. Small hiatal hernia. Electronically Signed   By: Ozell Daring M.D.   On: 05/26/2024 12:59    I independently reviewed the above imaging studies.  Impression/Recommendation 53 year old female with an obstructing 6 mm left proximal ureteral stone with concomitant UTI  -The risks,  benefits and alternatives of emergent cystoscopy with LEFT JJ stent placement was discussed with the patient.  Risks include, but are not limited to: bleeding, urinary tract infection, ureteral injury, ureteral stricture disease, chronic pain, urinary symptoms, bladder injury, stent migration, the need for nephrostomy tube placement, MI, CVA, DVT, PE and the inherent risks with general anesthesia.  The patient voices understanding and wishes to proceed.  - Blood  and urine cultures pending.  Continue empiric Rocephin   Lonni Han, MD [facility] 05/26/2024, 2:12 PM

## 2024-05-26 NOTE — Op Note (Signed)
 Operative Note  Preoperative diagnosis:  1.  Left ureteral stone  Postoperative diagnosis: 1.  Left ureteral stone  Procedure(s): 1.  Cystoscopy with left ureteral stent placement 2.  Left retrograde pyelogram with intraoperative interpretation fluoroscopic imaging  Surgeon: Lonni Han, MD  Assistants:  None  Anesthesia:  General  Complications:  None  EBL: Less than 5 mL  Specimens: 1.  None  Drains/Catheters: 1.  Left 6 French, 24 cm JJ stent without tether 2.  16 French Foley catheter  Intraoperative findings:   Solitary left collecting system with no filling defects or dilation involving the left ureter or left renal pelvis seen on retrograde pyelogram No intravesical or urethral abnormalities were seen  Indication:  [name] is a 53 y.o. female with sepsis secondary to an obstructing left proximal ureteral stone.  She has been consented for the above procedures, voices understanding and wishes to proceed.  Description of procedure:  After informed consent was obtained, the patient was brought to the operating room and general LMA anesthesia was administered. The patient was then placed in the dorsolithotomy position and prepped and draped in the usual sterile fashion. A timeout was performed. A 21 French rigid cystoscope was then inserted into the urethral meatus and advanced into the bladder under direct vision. A complete bladder survey revealed no intravesical pathology.  A 5 French ureteral catheter was then inserted into the left ureteral orifice and a retrograde pyelogram was obtained, with the findings listed above.  A Glidewire was then used to intubate the lumen of the ureteral catheter and was advanced up to the left renal pelvis, under fluoroscopic guidance.  The catheter was then removed, leaving the wire in place.  A 6 French, 24 cm JJ stent was then advanced over the Glidewire and into good position within the left collecting  system, confirming placement via fluoroscopy.  A 16 French Foley catheter was then inserted and placed to gravity drainage.  She tolerated the procedure well and was transferred to the postanesthesia in stable condition.  Plan: Okay to remove Foley catheter if she remains afebrile for 24 hours.  Plan for outpatient ureteroscopy to address her left proximal ureteral stone

## 2024-05-26 NOTE — H&P (Addendum)
 History and Physical   [name] FMW:[id number] DOB: 1971/01/17 DOA: 05/26/2024  PCP: Mason Haywood LABOR, PA-C   Patient coming from: Home  Chief Complaint: Abdominal pain, nausea, diarrhea  HPI: [name] is a 53 y.o. female with medical history significant of hypertension, hyperlipidemia, depression, anxiety, cataract, macular degeneration presenting with abdominal pain, nausea, diarrhea.  History obtained with assistance of chart review and translation; family member assisted with translation per patient preference.  Patient has had left-sided abdominal pain for the past day or so.  Pain described as dull and constant.  Pain does not radiate.  Had several episodes of diarrhea today as well as an episode of nausea and vomiting.  No blood in either.  Denies any fevers, dysuria, hematuria at home.  Further denies chest pain, shortness of breath.  ED Course: Vital signs in the ED notable for temperature to 100.6, blood pressure in the 100s-110 systolic, respiratory rate in the 20s, heart rate in the 100s-110s.  Lab workup included CMP with potassium 3.1, bicarb 20, glucose 148, BUN 21.  CBC with leukocytosis 10.8 and platelets 557.  Lactic acid pending.  Lipase negative.  Urinalysis with hemoglobin, protein, leukocytes, bacteria.  Urine culture and blood culture pending.  CT on pelvis showed obstructing left UPJ calculus with high-grade obstructive uropathy.  Cannot exclude superimposed infection.  Also noted were fibroids, diverticulosis without diverticulitis and a small hiatal hernia.  Patient received ceftriaxone , morphine , 1 L IV fluids, 20 McCue IV potassium, Zofran  in the ED.  Urology was consulted and are seeing the patient, likely stent placement.  Review of Systems: As per HPI otherwise all other systems reviewed and are negative.  Past Medical History:  Diagnosis Date   Abnormal colonoscopy 10/30/2019   Impression: - One 12 mm polyp in the  transverse colon, removed with a cold snare. Resected and retrieved. One 2 mm polyp in the sigmoid colon, removed with a cold snare. Resected and retrieved. Diverticulosis in the ascending colon. Non-bleeding internal hemorrhoids. Return to GI PRN. Repeat in 3 years. May be amenable to hermorrhoid band ligation.     Benign neoplasm of ascending colon 01/28/2023   12/14/2022     Benign neoplasm of transverse colon 10/30/2019   Cervical dysplasia    CIN I (cervical intraepithelial neoplasia I)    Grade II hemorrhoids 01/28/2023   12/14/2022     High risk HPV infection    History of abnormal cervical Pap smear    12-28-21 lgsil HPV HR +   History of adenomatous polyp of colon    History of infection due to human papilloma virus (HPV) 11/20/2019   Present again 06/19/2020 Not present 03/11/2023     Hypertension    Hypertensive retinopathy    OU   Macular degeneration, right eye    Missed abortion 05/04/2023   05/16/2013     Polyp of sigmoid colon 10/30/2019   Polyp of transverse colon 10/30/2019   Retinal defect, 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   followed by dr zamora;  pt takes  inspra  for excess fluid in retina,   Sciatic nerve injury    Seasonal allergies    Vaginal dysplasia    Wears glasses     Past Surgical History:  Procedure Laterality Date   CERVICAL CONIZATION W/BX N/A 06/30/2023   Procedure: CONIZATION CERVIX WITH BIOPSY, ENDOCERVICAL CURETTAGE;  Surgeon: Viktoria Comer SAUNDERS, MD;  Location: [facility];  Service: Gynecology;  Laterality: N/A;   CO2 LASER APPLICATION N/A 06/30/2023  Procedure: CO2 LASER TO THE VAGINA;  Surgeon: Viktoria Comer SAUNDERS, MD;  Location: [facility];  Service: Gynecology;  Laterality: N/A;   COLONOSCOPY  12/14/2022   HYSTEROSCOPY WITH RESECTOSCOPE  11/25/2006   @[facility]  by dr jinny. winfred;   Resectoscopic myomectomy and polypectomy   INJECTION OF SILICONE OIL Right 09/15/2023   Procedure: INJECTION OF SILICONE OIL;  Surgeon: Valdemar Rogue, MD;  Location: [facility] OR;  Service: Ophthalmology;  Laterality: Right;   INJECTION OF SILICONE OIL Right 02/23/2024   Procedure: INJECTION, SILICONE OIL EXCHANGE;  Surgeon: Valdemar Rogue, MD;  Location: [facility] OR;  Service: Ophthalmology;  Laterality: Right;   PARS PLANA VITRECTOMY Right 09/15/2023   Procedure: PARS PLANA VITRECTOMY WITH 25 GAUGE;  Surgeon: Valdemar Rogue, MD;  Location: [facility] OR;  Service: Ophthalmology;  Laterality: Right;   PARS PLANA VITRECTOMY W/ FOCAL ENDOLASER PHOTOCOAGULATION Right 02/23/2024   Procedure: 25 GAUGE PARS PLANA VITRECTOMY  WITH LENSECTOMY, MEMBRANE PEEL, AND ENDOLASER;  Surgeon: Valdemar Rogue, MD;  Location: [facility] OR;  Service: Ophthalmology;  Laterality: Right;   REMOVAL RETAINED LENS Right 02/23/2024   Procedure: REMOVAL, RETAINED LENS MATTER;  Surgeon: Valdemar Rogue, MD;  Location: [facility] OR;  Service: Ophthalmology;  Laterality: Right;   REPAIR OF COMPLEX TRACTION RETINAL DETACHMENT Right 02/23/2024   Procedure: REPAIR, RETINAL DETACHMENT, COMPLEX;  Surgeon: Valdemar Rogue, MD;  Location: [facility] OR;  Service: Ophthalmology;  Laterality: Right;   VAGINECTOMY, PARTIAL N/A 06/30/2023   Procedure: SMALL VAGINECTOMY, PARTIAL BIOSPIES;  Surgeon: Viktoria Comer SAUNDERS, MD;  Location: [facility];  Service: Gynecology;  Laterality: N/A;    Social History  reports that she has never smoked. She has never used smokeless tobacco. She reports that she does not drink alcohol and does not use drugs.  Allergies  Allergen Reactions   Pollen Extract Itching    POLLEN EXTRACTS    Family History  Problem Relation Age of Onset   Hypertension Mother    Diabetes Father    Diabetes Maternal Grandmother    Hypertension Maternal Grandmother    Colon cancer Neg Hx    Colon polyps Neg Hx    Esophageal cancer Neg Hx    Rectal cancer Neg Hx    Stomach cancer Neg Hx   Reviewed on admission  Prior to Admission medications   Medication Sig Start Date End Date Taking?  Authorizing Provider  acetaminophen  (TYLENOL ) 500 MG tablet Take 500-1,000 mg by mouth every 6 (six) hours as needed (pain.).    [provider]  bacitracin -polymyxin b  (POLYSPORIN ) ophthalmic ointment Place into the right eye 4 (four) times daily. 03/01/24   Valdemar Rogue, MD  Brinzolamide-Brimonidine  [facility]) 1-0.2 % SUSP Place 1 drop into the right eye in the morning, at noon, and at bedtime. Patient not taking: Reported on 04/26/2024    [provider]  Bromfenac  Sodium 0.09 % SOLN INSTILL 1 DROP IN THE MORNING, AT NOON, IN THE EVENING, AND AT BEDTIME. 1 DROP INTO RIGHT EYE 4 TIMES DAILY Patient not taking: Reported on 04/26/2024    [provider]  Estradiol  (VAGIFEM ) 10 MCG TABS vaginal tablet Place 1 tablet (10 mcg total) vaginally 2 (two) times a week. Use every night before bed for two weeks when you first begin this medicine, then after the first two weeks, begin using it twice a week. Patient not taking: Reported on 04/26/2024 03/12/24   Cathlyn JAYSON Nikki Bobie FORBES, MD  hydrochlorothiazide  (HYDRODIURIL ) 25 MG tablet Take 25 mg by mouth daily.  05/24/19   [provider]  ibuprofen  (ADVIL ) 200 MG tablet Take 200-400 mg by mouth every 8 (eight) hours as needed (pain.).    [provider]  ketorolac  (ACULAR ) 0.5 % ophthalmic solution Place 1 drop into the left eye 4 (four) times daily. 03/01/24 03/01/25  Valdemar Rogue, MD  lisinopril  (PRINIVIL ,ZESTRIL ) 20 MG tablet Take 1 tablet (20 mg total) by mouth daily. 08/10/17   Lavoie, Marie-Lyne, MD  nitrofurantoin , macrocrystal-monohydrate, (MACROBID ) 100 MG capsule Take 1 capsule by mouth 2 (two) times daily. Patient not taking: Reported on 04/26/2024    [provider]  prednisoLONE  acetate (PRED FORTE ) 1 % ophthalmic suspension Place 1 drop into the left eye 4 (four) times daily. 05/24/24   Valdemar Rogue, MD  predniSONE  (DELTASONE ) 20 MG tablet TAKE 2 TABLETS BY MOUTH ONCE DAILY WITH BREAKFAST Patient  not taking: Reported on 04/26/2024    [provider]  traMADol  (ULTRAM ) 50 MG tablet TAKE 1 TABLET BY MOUTH EVERY 6 HOURS AS NEEDED FOR SEVER PAIN. FOR AFTER SURGERY, DO NOT TAKE AND DRIVE Patient not taking: Reported on 03/12/2024    [provider]    Physical Exam: Vitals:   05/26/24 0910 05/26/24 1130 05/26/24 1323 05/26/24 1330  BP:  114/62  108/68  Pulse:  (!) 109  (!) 118  Resp:  (!) 23  (!) 21  Temp:   (!) 100.6 F (38.1 C)   TempSrc:   Oral   SpO2:  98%  98%  Weight: 70.3 kg     Height: 5' 4 (1.626 m)       Physical Exam Constitutional:      General: She is not in acute distress.    Appearance: Normal appearance.  HENT:     Head: Normocephalic and atraumatic.     Mouth/Throat:     Mouth: Mucous membranes are moist.     Pharynx: Oropharynx is clear.  Eyes:     Extraocular Movements: Extraocular movements intact.     Pupils: Pupils are equal, round, and reactive to light.  Cardiovascular:     Rate and Rhythm: Normal rate and regular rhythm.     Pulses: Normal pulses.     Heart sounds: Normal heart sounds.  Pulmonary:     Effort: Pulmonary effort is normal. No respiratory distress.     Breath sounds: Normal breath sounds.  Abdominal:     General: Bowel sounds are normal. There is no distension.     Palpations: Abdomen is soft.     Tenderness: There is abdominal tenderness in the left upper quadrant.  Musculoskeletal:        General: No swelling or deformity.  Skin:    General: Skin is warm and dry.  Neurological:     General: No focal deficit present.     Mental Status: Mental status is at baseline.      Labs on Admission: I have personally reviewed following labs and imaging studies  CBC: Recent Labs  Lab 05/26/24 0913  WBC 10.8*  HGB 12.7  HCT 39.7  MCV 88.8  PLT 551*    Basic Metabolic Panel: Recent Labs  Lab 05/26/24 0913  NA 137  K 3.1*  CL 104  CO2 20*  GLUCOSE 148*  BUN 21*  CREATININE 0.99  CALCIUM 9.5     GFR: Estimated Creatinine Clearance: 61.7 mL/min (by C-G formula based on SCr of 0.99 mg/dL).  Liver Function Tests: Recent Labs  Lab 05/26/24 0913  AST 31  ALT 35  ALKPHOS 108  BILITOT 1.0  PROT 7.7  ALBUMIN 3.5    Urine analysis:    Component Value Date/Time   COLORURINE AMBER (A) 05/26/2024 0913   APPEARANCEUR CLOUDY (A) 05/26/2024 0913   LABSPEC 1.027 05/26/2024 0913   PHURINE 6.0 05/26/2024 0913   GLUCOSEU NEGATIVE 05/26/2024 0913   HGBUR MODERATE (A) 05/26/2024 0913   BILIRUBINUR NEGATIVE 05/26/2024 0913   KETONESUR NEGATIVE 05/26/2024 0913   PROTEINUR 100 (A) 05/26/2024 0913   UROBILINOGEN 0.2 07/01/2014 0837   NITRITE NEGATIVE 05/26/2024 0913   LEUKOCYTESUR MODERATE (A) 05/26/2024 0913    Radiological Exams on Admission: CT ABDOMEN PELVIS W CONTRAST Result Date: 05/26/2024 CLINICAL DATA:  Lower abdominal pain, vomiting, diarrhea, chills EXAM: CT ABDOMEN AND PELVIS WITH CONTRAST TECHNIQUE: Multidetector CT imaging of the abdomen and pelvis was performed using the standard protocol following bolus administration of intravenous contrast. RADIATION DOSE REDUCTION: This exam was performed according to the departmental dose-optimization program which includes automated exposure control, adjustment of the mA and/or kV according to patient size and/or use of iterative reconstruction technique. CONTRAST:  75mL OMNIPAQUE  IOHEXOL  350 MG/ML SOLN COMPARISON:  None Available. FINDINGS: Lower chest: No acute pleural or parenchymal lung disease. Hepatobiliary: Multiple simple appearing hepatic cysts. Liver is otherwise unremarkable. No evidence of cholelithiasis or cholecystitis. No biliary duct dilation. Pancreas: Unremarkable. No pancreatic ductal dilatation or surrounding inflammatory changes. Spleen: Normal in size without focal abnormality. Adrenals/Urinary Tract: There is an obstructing 6 mm calculus within the proximal left ureter at the UPJ, reference image 42/3. There is  moderate left hydronephrosis with severe left renal edema and perinephric fat stranding compatible with high-grade obstruction. Superimposed infection cannot be excluded. The right kidney is unremarkable. No right-sided calculi or obstructive uropathy. The adrenals and bladder appear unremarkable. Stomach/Bowel: No bowel obstruction or ileus. Normal appendix right lower quadrant. Scattered colonic diverticulosis without diverticulitis. Small hiatal hernia. No bowel wall thickening or inflammatory change. Vascular/Lymphatic: No significant vascular findings are present. No enlarged abdominal or pelvic lymph nodes. Reproductive: Heterogeneous appearance of the uterus consistent with multiple fibroids. No adnexal masses. Other: No free fluid or free intraperitoneal gas. No abdominal wall hernia. Musculoskeletal: No acute or destructive bony abnormalities. Reconstructed images demonstrate no additional findings. IMPRESSION: 1. Obstructing 6 mm left UPJ calculus, with high-grade left-sided obstructive uropathy. Superimposed infection cannot be excluded. 2. Scattered colonic diverticulosis without diverticulitis. 3. Fibroid uterus. 4. Small hiatal hernia. Electronically Signed   By: Ozell Daring M.D.   On: 05/26/2024 12:59   EKG: Independently reviewed.  Sinus tachycardia 114 bpm.  Nonspecific T wave changes.  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  Found to have obstructing left UPJ renal stone with high-grade obstructive uropathy.  CT cannot exclude superimposed infection > Patient noted to have leukocytosis to 10.8 and spiked a fever to 100.6 while in the ED.  Became tachycardic with fever and pain. > Urology consulted and  recommend stent  placement and continue antibiotics. - Monitor in telemetry overnight - Appreciate urology recommendations and assistance - Continue ceftriaxone  - Follow urine culture - Trend fever curve and WBC - IV fluids - Supportive care  Hypertension - Restart home hydrochlorothiazide  and lisinopril  tomorrow  Cataracts Macular degeneration - Resume home eyedrops when confirmed  DVT prophylaxis: SCDs for now Code Status:   Full Family Communication:  Updated at bedside  Disposition Plan:   Patient is from:  Home  Anticipated DC to:  Home  Anticipated DC date:  1 to 3 days  Anticipated DC barriers: None Consults called:  Urology Admission status:  Observation, telemetry  Severity of Illness: The appropriate patient status for this patient is OBSERVATION. Observation status is judged to be reasonable and necessary in order to provide the required intensity of service to ensure the patient's safety. The patient's presenting symptoms, physical exam findings, and initial radiographic and laboratory data in the context of their medical condition is felt to place them at decreased risk for further clinical deterioration. Furthermore, it is anticipated that the patient will be medically stable for discharge from the hospital within 2 midnights of admission.    Marsa KATHEE Scurry MD [facility]  How to contact the [facility] Attending or Consulting provider 7A - 7P or covering provider during after hours 7P -7A, for this patient?   Check the care team in [facility] and look for a) attending/consulting [facility] provider listed and b) the [facility] team listed Log into [email or web address] and use [facility] universal password to access. If you do not have the password, please contact the hospital operator. Locate the [facility] provider you are looking for under [facility] and page to a number that you can be directly reached. If you still have difficulty reaching the provider, please page the [facility] (Director on Call) for  the Hospitalists listed on amion for assistance.  05/26/2024, 2:25 PM

## 2024-05-26 NOTE — Anesthesia Procedure Notes (Signed)
 Procedure Name: LMA Insertion Date/Time: 05/26/2024 4:12 PM  Performed by: Tressie Gilmore RAMAN, CRNAPre-anesthesia Checklist: Patient identified, Emergency Drugs available, Suction available and Patient being monitored Patient Re-evaluated:Patient Re-evaluated prior to induction Oxygen Delivery Method: Circle System Utilized Preoxygenation: Pre-oxygenation with 100% oxygen Induction Type: IV induction Ventilation: Mask ventilation without difficulty LMA: LMA inserted LMA Size: 3.0 Number of attempts: 1 Airway Equipment and Method: Bite block Placement Confirmation: positive ETCO2 Tube secured with: Tape Dental Injury: Teeth and Oropharynx as per pre-operative assessment

## 2024-05-26 NOTE — ED Notes (Signed)
 Patient states she can't urinate now.

## 2024-05-26 NOTE — ED Provider Notes (Signed)
 [place] EMERGENCY DEPARTMENT AT [facility] Provider Note   CSN: [id number] Arrival date & time: 05/26/24  0900     Patient presents with: Abdominal Pain, Emesis, and Diarrhea   [name] is a 53 y.o. female.   Pt c/o left sided abd pain and nvd last night. Dull pain, constant, non radiating, without specific exacerbating or alleviating factors. 3-4 episodes diarrhea, not bloody. Few episodes of nv, not bloody or bilious. No abd distension. No fevers. No dysuria, hematuria or gu c/o. No pelvic pain, or vaginal bleeding/discharge. No hx same pain. No recent new meds, abx use, bad food exposure or ill contacts. Hx diverticula on prior colonoscopy.   The history is provided by the patient, medical records and a relative. Language interpreter used: av interpreter device used.  Abdominal Pain Associated symptoms: diarrhea and vomiting   Associated symptoms: no chest pain, no cough, no dysuria, no fever, no hematuria, no shortness of breath, no sore throat, no vaginal bleeding and no vaginal discharge   Emesis Associated symptoms: abdominal pain and diarrhea   Associated symptoms: no cough, no fever, no headaches and no sore throat   Diarrhea Associated symptoms: abdominal pain and vomiting   Associated symptoms: no fever and no headaches        Prior to Admission medications   Medication Sig Start Date End Date Taking? Authorizing Provider  acetaminophen  (TYLENOL ) 500 MG tablet Take 500-1,000 mg by mouth every 6 (six) hours as needed (pain.).    [provider]  bacitracin -polymyxin b  (POLYSPORIN ) ophthalmic ointment Place into the right eye 4 (four) times daily. 03/01/24   Valdemar Rogue, MD  Brinzolamide-Brimonidine  [facility]) 1-0.2 % SUSP Place 1 drop into the right eye in the morning, at noon, and at bedtime. Patient not taking: Reported on 04/26/2024    [provider]  Bromfenac  Sodium 0.09 % SOLN INSTILL 1 DROP IN THE MORNING, AT  NOON, IN THE EVENING, AND AT BEDTIME. 1 DROP INTO RIGHT EYE 4 TIMES DAILY Patient not taking: Reported on 04/26/2024    [provider]  Estradiol  (VAGIFEM ) 10 MCG TABS vaginal tablet Place 1 tablet (10 mcg total) vaginally 2 (two) times a week. Use every night before bed for two weeks when you first begin this medicine, then after the first two weeks, begin using it twice a week. Patient not taking: Reported on 04/26/2024 03/12/24   Cathlyn JAYSON Nikki Bobie FORBES, MD  hydrochlorothiazide  (HYDRODIURIL ) 25 MG tablet Take 25 mg by mouth daily. 05/24/19   [provider]  ibuprofen  (ADVIL ) 200 MG tablet Take 200-400 mg by mouth every 8 (eight) hours as needed (pain.).    [provider]  ketorolac  (ACULAR ) 0.5 % ophthalmic solution Place 1 drop into the left eye 4 (four) times daily. 03/01/24 03/01/25  Valdemar Rogue, MD  lisinopril  (PRINIVIL ,ZESTRIL ) 20 MG tablet Take 1 tablet (20 mg total) by mouth daily. 08/10/17   Lavoie, Marie-Lyne, MD  nitrofurantoin , macrocrystal-monohydrate, (MACROBID ) 100 MG capsule Take 1 capsule by mouth 2 (two) times daily. Patient not taking: Reported on 04/26/2024    [provider]  prednisoLONE  acetate (PRED FORTE ) 1 % ophthalmic suspension Place 1 drop into the left eye 4 (four) times daily. 05/24/24   Valdemar Rogue, MD  predniSONE  (DELTASONE ) 20 MG tablet TAKE 2 TABLETS BY MOUTH ONCE DAILY WITH BREAKFAST Patient not taking: Reported on 04/26/2024    [provider]  traMADol  (ULTRAM ) 50 MG tablet TAKE 1 TABLET BY MOUTH EVERY 6  HOURS AS NEEDED FOR SEVER PAIN. FOR AFTER SURGERY, DO NOT TAKE AND DRIVE Patient not taking: Reported on 03/12/2024    [provider]    Allergies: Pollen extract    Review of Systems  Constitutional:  Negative for fever.  HENT:  Negative for sore throat.   Respiratory:  Negative for cough and shortness of breath.   Cardiovascular:  Negative for chest pain.  Gastrointestinal:  Positive for abdominal  pain, diarrhea and vomiting.  Genitourinary:  Negative for dysuria, flank pain, hematuria, vaginal bleeding and vaginal discharge.  Musculoskeletal:  Negative for back pain and neck pain.  Skin:  Negative for rash.  Neurological:  Negative for headaches.    Updated Vital Signs BP 114/62 (BP Location: Left Arm)   Pulse (!) 109   Temp (!) 100.6 F (38.1 C) (Oral)   Resp (!) 23   Ht 1.626 m (5' 4)   Wt 70.3 kg   LMP 02/18/2022 Comment: sexually active, condoms  SpO2 98%   BMI 26.61 kg/m   Physical Exam Vitals and nursing note reviewed.  Constitutional:      Appearance: Normal appearance. She is well-developed.  HENT:     Head: Atraumatic.     Nose: Nose normal.     Mouth/Throat:     Mouth: Mucous membranes are moist.  Eyes:     General: No scleral icterus.    Conjunctiva/sclera: Conjunctivae normal.  Neck:     Trachea: No tracheal deviation.  Cardiovascular:     Rate and Rhythm: Regular rhythm. Tachycardia present.     Pulses: Normal pulses.     Heart sounds: Normal heart sounds. No murmur heard.    No friction rub. No gallop.  Pulmonary:     Effort: Pulmonary effort is normal. No respiratory distress.     Breath sounds: Normal breath sounds.  Abdominal:     General: Bowel sounds are normal. There is no distension.     Palpations: Abdomen is soft. There is no mass.     Tenderness: There is abdominal tenderness. There is no guarding or rebound.     Hernia: No hernia is present.     Comments: Left sided abd tenderness.   Genitourinary:    Comments: No cva tenderness.  Musculoskeletal:        General: No swelling or tenderness.     Cervical back: Normal range of motion and neck supple. No rigidity. No muscular tenderness.     Right lower leg: No edema.     Left lower leg: No edema.  Skin:    General: Skin is warm and dry.     Findings: No rash.  Neurological:     Mental Status: She is alert.     Comments: Alert, speech normal.   Psychiatric:        Mood and  Affect: Mood normal.     (all labs ordered are listed, but only abnormal results are displayed) Results for orders placed or performed during the hospital encounter of 05/26/24  Lipase, blood   Collection Time: 05/26/24  9:13 AM  Result Value Ref Range   Lipase 32 11 - 51 U/L  Comprehensive metabolic panel   Collection Time: 05/26/24  9:13 AM  Result Value Ref Range   Sodium 137 135 - 145 mmol/L   Potassium 3.1 (L) 3.5 - 5.1 mmol/L   Chloride 104 98 - 111 mmol/L   CO2 20 (L) 22 - 32 mmol/L   Glucose, Bld 148 (H) 70 - 99  mg/dL   BUN 21 (H) 6 - 20 mg/dL   Creatinine, Ser 9.00 0.44 - 1.00 mg/dL   Calcium 9.5 8.9 - 89.6 mg/dL   Total Protein 7.7 6.5 - 8.1 g/dL   Albumin 3.5 3.5 - 5.0 g/dL   AST 31 15 - 41 U/L   ALT 35 0 - 44 U/L   Alkaline Phosphatase 108 38 - 126 U/L   Total Bilirubin 1.0 0.0 - 1.2 mg/dL   GFR, Estimated >39 >39 mL/min   Anion gap 13 5 - 15  CBC   Collection Time: 05/26/24  9:13 AM  Result Value Ref Range   WBC 10.8 (H) 4.0 - 10.5 K/uL   RBC 4.47 3.87 - 5.11 MIL/uL   Hemoglobin 12.7 12.0 - 15.0 g/dL   HCT 60.2 63.9 - 53.9 %   MCV 88.8 80.0 - 100.0 fL   MCH 28.4 26.0 - 34.0 pg   MCHC 32.0 30.0 - 36.0 g/dL   RDW 86.4 88.4 - 84.4 %   Platelets 551 (H) 150 - 400 K/uL   nRBC 0.0 0.0 - 0.2 %  Urinalysis, Routine w reflex microscopic -Urine, Clean Catch   Collection Time: 05/26/24  9:13 AM  Result Value Ref Range   Color, Urine AMBER (A) YELLOW   APPearance CLOUDY (A) CLEAR   Specific Gravity, Urine 1.027 1.005 - 1.030   pH 6.0 5.0 - 8.0   Glucose, UA NEGATIVE NEGATIVE mg/dL   Hgb urine dipstick MODERATE (A) NEGATIVE   Bilirubin Urine NEGATIVE NEGATIVE   Ketones, ur NEGATIVE NEGATIVE mg/dL   Protein, ur 899 (A) NEGATIVE mg/dL   Nitrite NEGATIVE NEGATIVE   Leukocytes,Ua MODERATE (A) NEGATIVE   RBC / HPF >50 0 - 5 RBC/hpf   WBC, UA >50 0 - 5 WBC/hpf   Bacteria, UA RARE (A) NONE SEEN   Squamous Epithelial / HPF 11-20 0 - 5 /HPF   Mucus PRESENT    CT  ABDOMEN PELVIS W CONTRAST Result Date: 05/26/2024 CLINICAL DATA:  Lower abdominal pain, vomiting, diarrhea, chills EXAM: CT ABDOMEN AND PELVIS WITH CONTRAST TECHNIQUE: Multidetector CT imaging of the abdomen and pelvis was performed using the standard protocol following bolus administration of intravenous contrast. RADIATION DOSE REDUCTION: This exam was performed according to the departmental dose-optimization program which includes automated exposure control, adjustment of the mA and/or kV according to patient size and/or use of iterative reconstruction technique. CONTRAST:  75mL OMNIPAQUE  IOHEXOL  350 MG/ML SOLN COMPARISON:  None Available. FINDINGS: Lower chest: No acute pleural or parenchymal lung disease. Hepatobiliary: Multiple simple appearing hepatic cysts. Liver is otherwise unremarkable. No evidence of cholelithiasis or cholecystitis. No biliary duct dilation. Pancreas: Unremarkable. No pancreatic ductal dilatation or surrounding inflammatory changes. Spleen: Normal in size without focal abnormality. Adrenals/Urinary Tract: There is an obstructing 6 mm calculus within the proximal left ureter at the UPJ, reference image 42/3. There is moderate left hydronephrosis with severe left renal edema and perinephric fat stranding compatible with high-grade obstruction. Superimposed infection cannot be excluded. The right kidney is unremarkable. No right-sided calculi or obstructive uropathy. The adrenals and bladder appear unremarkable. Stomach/Bowel: No bowel obstruction or ileus. Normal appendix right lower quadrant. Scattered colonic diverticulosis without diverticulitis. Small hiatal hernia. No bowel wall thickening or inflammatory change. Vascular/Lymphatic: No significant vascular findings are present. No enlarged abdominal or pelvic lymph nodes. Reproductive: Heterogeneous appearance of the uterus consistent with multiple fibroids. No adnexal masses. Other: No free fluid or free intraperitoneal gas. No  abdominal wall hernia. Musculoskeletal: No  acute or destructive bony abnormalities. Reconstructed images demonstrate no additional findings. IMPRESSION: 1. Obstructing 6 mm left UPJ calculus, with high-grade left-sided obstructive uropathy. Superimposed infection cannot be excluded. 2. Scattered colonic diverticulosis without diverticulitis. 3. Fibroid uterus. 4. Small hiatal hernia. Electronically Signed   By: Ozell Daring M.D.   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 Laser settings: Lumenis [id number] laser, slit lamp Lens: Mainster PRP 165 Power: 260 mW Spot size: 400 microns Duration: 30 msec # spots: [id number] Placement of laser: Using a Mainster PRP 165 contact lens at the slit lamp, laser was placed in three+ confluent rows around focal SRF at 0400 and posterior to ST schisis cavity from 0100-0200. Complications: None. Patient tolerated the procedure well and received written and verbal post-procedure care information/education.  OCT, Retina - OU - Both Eyes Result Date: 05/24/2024 Right Eye Quality was borderline. Central Foveal Thickness: 242. Progression has improved. Findings include no IRF, no SRF, abnormal foveal contour, subretinal hyper-reflective material, epiretinal membrane, pigment epithelial detachment, outer retinal atrophy (Better image quality, retina stably reattached, diffuse atrophy ). Left Eye Quality was good. Central Foveal Thickness: 297.  Progression has worsened. Findings include normal foveal contour, no IRF, subretinal fluid, vitreomacular adhesion (bullous schisis cavity IT periphery w/ new SRF caught on widefield). 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 CME - cystoid macular edema. PED - pigment epithelial detachment. IRF - intraretinal fluid. SRF - subretinal fluid. EZ - ellipsoid zone. ERM - epiretinal membrane. ORA - outer retinal atrophy. ORT - outer retinal tubulation. SRHM - subretinal hyper-reflective material   US  Abdomen Complete Result Date: 05/04/2024 CLINICAL DATA:  elevated liver enzymes EXAM: ABDOMEN ULTRASOUND COMPLETE COMPARISON:  None Available. FINDINGS: Gallbladder: No gallstones or wall thickening visualized. No sonographic Murphy sign noted by sonographer. Common bile duct: Diameter: Visualized portion measures 6 mm, within normal limits. Liver: Multiple scattered small benign cysts are noted measuring up to 1.7 cm. Within normal limits in parenchymal echogenicity. Portal vein is patent on color Doppler imaging with normal direction of blood flow towards the liver. IVC: No abnormality visualized. Pancreas: Visualized portion unremarkable. Spleen: Size and appearance within normal limits. Right Kidney: Length: 10.4 cm. Echogenicity within normal limits. No mass or hydronephrosis visualized. Left Kidney: Length: 11.3 cm. Echogenicity within normal limits. No mass or hydronephrosis visualized. Portions are suboptimally assessed secondary to shadowing bowel gas. Abdominal aorta: No aneurysm visualized. Other findings: None. IMPRESSION: No sonographic etiology for elevated liver enzymes is identified. Electronically Signed   By: Corean Salter M.D.   On: 05/04/2024 08:09  EKG: None  Radiology: CT ABDOMEN PELVIS W CONTRAST Result Date: 05/26/2024 CLINICAL DATA:  Lower abdominal pain, vomiting, diarrhea, chills EXAM: CT ABDOMEN AND PELVIS WITH CONTRAST TECHNIQUE: Multidetector CT imaging of the abdomen and pelvis was performed using the standard protocol following bolus administration of intravenous contrast. RADIATION DOSE REDUCTION: This exam was performed according to the departmental dose-optimization program which includes automated exposure control, adjustment of the mA and/or kV according to patient size and/or use of iterative reconstruction technique. CONTRAST:  75mL OMNIPAQUE  IOHEXOL  350 MG/ML SOLN COMPARISON:  None Available. FINDINGS: Lower chest: No acute pleural or parenchymal lung disease. Hepatobiliary: Multiple simple appearing hepatic cysts. Liver is otherwise unremarkable. No evidence of cholelithiasis or cholecystitis. No biliary duct dilation. Pancreas: Unremarkable. No pancreatic ductal dilatation or surrounding inflammatory changes. Spleen: Normal in size without focal abnormality. Adrenals/Urinary Tract: There is an obstructing 6 mm calculus within the proximal left ureter at the UPJ, reference image 42/3. There is moderate left hydronephrosis with severe left renal edema and perinephric fat stranding compatible with high-grade obstruction. Superimposed infection cannot be excluded. The right kidney is unremarkable. No right-sided calculi or obstructive uropathy. The adrenals and bladder appear unremarkable. Stomach/Bowel: No bowel obstruction or ileus. Normal appendix right lower quadrant. Scattered colonic diverticulosis without diverticulitis. Small hiatal hernia. No bowel wall thickening or inflammatory change. Vascular/Lymphatic: No significant vascular findings are present. No enlarged abdominal or pelvic lymph nodes. Reproductive: Heterogeneous appearance of the uterus consistent with multiple fibroids. No adnexal masses. Other: No free fluid  or free intraperitoneal gas. No abdominal wall hernia. Musculoskeletal: No acute or destructive bony abnormalities. Reconstructed images demonstrate no additional findings. IMPRESSION: 1. Obstructing 6 mm left UPJ calculus, with high-grade left-sided obstructive uropathy. Superimposed infection cannot be excluded. 2. Scattered colonic diverticulosis without diverticulitis. 3. Fibroid uterus. 4. Small hiatal hernia. Electronically Signed   By: Ozell Daring M.D.   On: 05/26/2024 12:59     Procedures   Medications Ordered in the ED  ondansetron  (ZOFRAN -ODT) disintegrating tablet 4 mg (has no administration in time range)  potassium chloride  10 mEq in 100 mL IVPB (10 mEq Intravenous New Bag/Given 05/26/24 1322)  cefTRIAXone  (ROCEPHIN ) 2 g in sodium chloride  0.9 % 100 mL IVPB (has no administration in time range)  lactated ringers  bolus 1,000 mL (1,000 mLs Intravenous New Bag/Given 05/26/24 1204)  morphine  (PF) 4 MG/ML injection 4 mg (4 mg Intravenous Given 05/26/24 1205)  ondansetron  (ZOFRAN ) injection 4 mg (4 mg Intravenous Given 05/26/24 1204)  iohexol  (OMNIPAQUE ) 350 MG/ML injection 75 mL (75 mLs Intravenous Contrast Given 05/26/24 1232)                                    Medical Decision Making Problems Addressed: Acute febrile illness: acute illness or injury with systemic symptoms Acute UTI: acute illness or injury with systemic symptoms that poses a threat to life or bodily functions Dehydration: acute illness or injury with systemic symptoms Hypokalemia: acute illness or injury Left flank pain: acute illness or injury with systemic symptoms Left ureteral stone: acute illness or injury with systemic symptoms Nausea vomiting and diarrhea: acute illness or injury with systemic symptoms  Amount and/or Complexity of Data Reviewed Independent Historian:     Details: Family, hx External Data Reviewed: notes. Labs: ordered. Decision-making details documented in ED Course. Radiology:  ordered and independent interpretation performed. Decision-making details documented in ED Course. Discussion of management or test interpretation with  external provider(s): Urology, medicine  Risk Prescription drug management. Parenteral controlled substances. Decision regarding hospitalization.   Iv ns. Continuous pulse ox and cardiac monitoring. Labs ordered/sent. Imaging ordered.   Differential diagnosis includes gastritis, gastroenteritis, diverticulitis, colitis, etc. Dispo decision including potential need for admission considered - will get labs and imaging and reassess.   Reviewed nursing notes and prior charts for additional history. External reports reviewed. Additional history from: family.   LR bolus. Morphine  iv. Zofran  iv.   Cardiac monitor: sinus rhythm, rate 115.  Labs reviewed/interpreted by me -  wbc 10, hgb 12. K mildly low. Kcl iv. Ct ordered.    CT reviewed/interpreted by me - left UPJ stone.   Additional labs subsequently reviewed/interpreted by me - ua returns w > 50 wbc and rbcs, some epis.  Pt now notes chills. T is 99.9.  given possible uti/infection with ureteral stone - will add lactate and cultures. Iv antibiotics given. Pain improved but persists. Urology consulted.   Medicine consulted for admission.        Final diagnoses:  Left flank pain  Left ureteral stone  Acute febrile illness  Acute UTI  Nausea vomiting and diarrhea  Dehydration  Hypokalemia    ED Discharge Orders     None          Bernard Drivers, MD 05/26/24 1329

## 2024-05-26 NOTE — ED Notes (Signed)
 Patient changed from street clothes to hospital gown.  All jewelry given to patient's sister. IV SL. Report given to day hospital nurse. Patient transferred to PACU bay 18 by Hank, NA. Family to surgical waiting room

## 2024-05-26 NOTE — Progress Notes (Signed)
 Pt arrived from PACU in bed. Pt is alert and oriented times 4. Oriented to room, call button and room phone in reached. Pt can understand some english. Pt is in yellow MEWS, endorse to ongoing shift due to HR and respiration.

## 2024-05-26 NOTE — ED Triage Notes (Signed)
 C/o lower abd. Pain with radiation into her back onset yest, states she started vomiting 3am. C/o diarrhea and chills

## 2024-05-27 ENCOUNTER — Encounter (INDEPENDENT_AMBULATORY_CARE_PROVIDER_SITE_OTHER): Payer: Self-pay | Admitting: Ophthalmology

## 2024-05-27 DIAGNOSIS — N200 Calculus of kidney: Secondary | ICD-10-CM | POA: Diagnosis not present

## 2024-05-27 DIAGNOSIS — R109 Unspecified abdominal pain: Secondary | ICD-10-CM | POA: Diagnosis present

## 2024-05-27 DIAGNOSIS — E860 Dehydration: Secondary | ICD-10-CM | POA: Diagnosis present

## 2024-05-27 DIAGNOSIS — N201 Calculus of ureter: Secondary | ICD-10-CM | POA: Diagnosis not present

## 2024-05-27 DIAGNOSIS — F32A Depression, unspecified: Secondary | ICD-10-CM | POA: Diagnosis present

## 2024-05-27 DIAGNOSIS — R6520 Severe sepsis without septic shock: Secondary | ICD-10-CM | POA: Diagnosis present

## 2024-05-27 DIAGNOSIS — I10 Essential (primary) hypertension: Secondary | ICD-10-CM | POA: Diagnosis not present

## 2024-05-27 DIAGNOSIS — D259 Leiomyoma of uterus, unspecified: Secondary | ICD-10-CM | POA: Diagnosis present

## 2024-05-27 DIAGNOSIS — E785 Hyperlipidemia, unspecified: Secondary | ICD-10-CM | POA: Diagnosis present

## 2024-05-27 DIAGNOSIS — Z8249 Family history of ischemic heart disease and other diseases of the circulatory system: Secondary | ICD-10-CM | POA: Diagnosis not present

## 2024-05-27 DIAGNOSIS — K5730 Diverticulosis of large intestine without perforation or abscess without bleeding: Secondary | ICD-10-CM | POA: Diagnosis present

## 2024-05-27 DIAGNOSIS — A4151 Sepsis due to Escherichia coli [E. coli]: Secondary | ICD-10-CM | POA: Diagnosis present

## 2024-05-27 DIAGNOSIS — H35033 Hypertensive retinopathy, bilateral: Secondary | ICD-10-CM | POA: Diagnosis present

## 2024-05-27 DIAGNOSIS — E876 Hypokalemia: Secondary | ICD-10-CM | POA: Diagnosis present

## 2024-05-27 DIAGNOSIS — N202 Calculus of kidney with calculus of ureter: Secondary | ICD-10-CM | POA: Diagnosis present

## 2024-05-27 DIAGNOSIS — N390 Urinary tract infection, site not specified: Secondary | ICD-10-CM | POA: Diagnosis not present

## 2024-05-27 DIAGNOSIS — Z860101 Personal history of adenomatous and serrated colon polyps: Secondary | ICD-10-CM | POA: Diagnosis not present

## 2024-05-27 DIAGNOSIS — H353290 Exudative age-related macular degeneration, unspecified eye, stage unspecified: Secondary | ICD-10-CM | POA: Diagnosis present

## 2024-05-27 DIAGNOSIS — Z87410 Personal history of cervical dysplasia: Secondary | ICD-10-CM | POA: Diagnosis not present

## 2024-05-27 DIAGNOSIS — N136 Pyonephrosis: Secondary | ICD-10-CM | POA: Diagnosis present

## 2024-05-27 DIAGNOSIS — Z833 Family history of diabetes mellitus: Secondary | ICD-10-CM | POA: Diagnosis not present

## 2024-05-27 DIAGNOSIS — F419 Anxiety disorder, unspecified: Secondary | ICD-10-CM | POA: Diagnosis present

## 2024-05-27 DIAGNOSIS — Z79899 Other long term (current) drug therapy: Secondary | ICD-10-CM | POA: Diagnosis not present

## 2024-05-27 LAB — BLOOD CULTURE ID PANEL (REFLEXED) - BCID2

## 2024-05-27 LAB — CBC
HCT: 32.4 % — ABNORMAL LOW (ref 36.0–46.0)
Hemoglobin: 10.5 g/dL — ABNORMAL LOW (ref 12.0–15.0)
MCH: 28.2 pg (ref 26.0–34.0)
MCHC: 32.4 g/dL (ref 30.0–36.0)
MCV: 87.1 fL (ref 80.0–100.0)
Platelets: 379 K/uL (ref 150–400)
RBC: 3.72 MIL/uL — ABNORMAL LOW (ref 3.87–5.11)
RDW: 14.4 % (ref 11.5–15.5)
WBC: 33.2 K/uL — ABNORMAL HIGH (ref 4.0–10.5)
nRBC: 0 % (ref 0.0–0.2)

## 2024-05-27 LAB — COMPREHENSIVE METABOLIC PANEL WITH GFR
ALT: 28 U/L (ref 0–44)
AST: 21 U/L (ref 15–41)
Albumin: 2.6 g/dL — ABNORMAL LOW (ref 3.5–5.0)
Alkaline Phosphatase: 89 U/L (ref 38–126)
Anion gap: 12 (ref 5–15)
BUN: 17 mg/dL (ref 6–20)
CO2: 23 mmol/L (ref 22–32)
Calcium: 8.9 mg/dL (ref 8.9–10.3)
Chloride: 105 mmol/L (ref 98–111)
Creatinine, Ser: 0.79 mg/dL (ref 0.44–1.00)
GFR, Estimated: >60 mL/min (ref >60)
Glucose, Bld: 121 mg/dL — ABNORMAL HIGH (ref 70–99)
Potassium: 3.4 mmol/L — ABNORMAL LOW (ref 3.5–5.1)
Sodium: 140 mmol/L (ref 135–145)
Total Bilirubin: 0.6 mg/dL (ref 0.0–1.2)
Total Protein: 6.8 g/dL (ref 6.5–8.1)

## 2024-05-27 MED ORDER — BACITRACIN-POLYMYXIN B 500-10000 UNIT/GM OP OINT
TOPICAL_OINTMENT | Freq: Four times a day (QID) | OPHTHALMIC | Status: DC
  Administered 2024-05-27: 1 via OPHTHALMIC
  Filled 2024-05-27: qty 3.5

## 2024-05-27 MED ORDER — POTASSIUM CHLORIDE CRYS ER 20 MEQ PO TBCR
40.0000 meq | EXTENDED_RELEASE_TABLET | Freq: Once | ORAL | Status: AC
  Administered 2024-05-27: 40 meq via ORAL
  Filled 2024-05-27: qty 2

## 2024-05-27 MED ORDER — HYDRALAZINE HCL 25 MG PO TABS: 25.0000 mg | ORAL_TABLET | Freq: Four times a day (QID) | ORAL | Status: DC | PRN

## 2024-05-27 MED ORDER — KETOROLAC TROMETHAMINE 0.5 % OP SOLN
1.0000 [drp] | Freq: Four times a day (QID) | OPHTHALMIC | Status: DC
  Administered 2024-05-27 – 2024-05-29 (×9): 1 [drp] via OPHTHALMIC
  Filled 2024-05-27: qty 5

## 2024-05-27 MED ORDER — MAGNESIUM SULFATE 4 GM/100ML IV SOLN
4.0000 g | Freq: Once | INTRAVENOUS | Status: AC
  Administered 2024-05-27: 4 g via INTRAVENOUS
  Filled 2024-05-27: qty 100

## 2024-05-27 NOTE — Progress Notes (Signed)
 [facility]  PROGRESS NOTE  Marvell Stavola FMW:[id number] DOB: [birth year] DOA: 05/26/2024 PCP: Mason Haywood LABOR, PA-C   Brief HPI:    53 y.o. female with medical history significant of hypertension, hyperlipidemia, depression, anxiety, cataract, macular degeneration presenting with abdominal pain, nausea, diarrhea.   History obtained with assistance of chart review and translation; family member assisted with translation per patient preference.  Patient has had left-sided abdominal pain for the past day or so.  Pain described as dull and constant.  Pain does not radiate.  Had several episodes of diarrhea today as well as an episode of nausea and vomiting.  No blood in either.  Denies any fevers, dysuria, hematuria at home. CT on pelvis showed obstructing left UPJ calculus with high-grade obstructive uropathy. Cannot exclude superimposed infection. Also noted were fibroids, diverticulosis without diverticulitis and a small hiatal hernia.    Assessment/Plan:   Obstructive uropathy; status post left ureteral stent placement  Left renal stone -Presented with abdominal pain, nausea and diarrhea; found to have an obstructing left UPJ renal stone with high-grade obstructive uropathy -Urology consulted, recommended stent placement with antibiotics -Patient underwent left ureteral stent placement -Started on empiric Rocephin   Gram-negative rod bacteremia -Blood cultures x 2 growing gram-negative rod bacteremia -Continue Rocephin  as above  E. coli UTI -Urine culture grew 100,000 colonies of E. Coli -Antibiotics as above   Hypertension - Blood pressure is soft, will hold HCTZ/lisinopril  Start hydralazine  25 mg p.o. every 6 hours as needed   Cataracts Macular degeneration - Resume home eyedrops   Hypomagnesemia - Magnesium  level is 1.2 - Will replace magnesium  with magnesium  sulfate 4 g IV x 1 - Follow serum magnesium  level in a.m.   Medications      hydrochlorothiazide   25 mg Oral Daily   lisinopril   20 mg Oral Daily   sodium chloride  flush  3 mL Intravenous Q12H     Data Reviewed:   CBG:  No results for input(s): GLUCAP in the last 168 hours.  SpO2: 97 %    Vitals:   05/26/24 2016 05/27/24 0103 05/27/24 0511 05/27/24 0724  BP: (!) 90/58 95/66 112/71 101/62  Pulse: (!) 101 84 94 91  Resp: 18 18 18 16   Temp: 98.9 F (37.2 C) 97.6 F (36.4 C) 98.7 F (37.1 C) 99 F (37.2 C)  TempSrc: Oral Oral Oral Oral  SpO2: 96% 100% 99% 97%  Weight:      Height:          Data Reviewed:  Basic Metabolic Panel: Recent Labs  Lab 05/26/24 0913 05/26/24 1900  NA 137  --   K 3.1*  --   CL 104  --   CO2 20*  --   GLUCOSE 148*  --   BUN 21*  --   CREATININE 0.99  --   CALCIUM 9.5  --   MG  --  1.2*    CBC: Recent Labs  Lab 05/26/24 0913 05/27/24 0742  WBC 10.8* 33.2*  HGB 12.7 10.5*  HCT 39.7 32.4*  MCV 88.8 87.1  PLT 551* 379    LFT Recent Labs  Lab 05/26/24 0913  AST 31  ALT 35  ALKPHOS 108  BILITOT 1.0  PROT 7.7  ALBUMIN 3.5     Antibiotics: Anti-infectives (From admission, onward)    Start     Dose/Rate Route Frequency Ordered Stop   05/27/24 1000  cefTRIAXone  (ROCEPHIN ) 2 g in sodium chloride  0.9 % 100 mL IVPB  2 g 200 mL/hr over 30 Minutes Intravenous Every 24 hours 05/26/24 1427     05/26/24 1330  cefTRIAXone  (ROCEPHIN ) 2 g in sodium chloride  0.9 % 100 mL IVPB        2 g 200 mL/hr over 30 Minutes Intravenous  Once 05/26/24 1318 05/26/24 1519        DVT prophylaxis: SCDs  Code Status: Full code  Family Communication: No family at bedside   CONSULTS urology   Subjective   Denies pain.  Communicated with the help of video interpreter   Objective    Physical Examination:   General-appears in no acute distress Heart-S1-S2, regular, no murmur auscultated Lungs-clear to auscultation bilaterally, no wheezing or crackles auscultated Abdomen-soft, nontender, no  organomegaly Extremities-no edema in the lower extremities Neuro-alert, oriented x3, no focal deficit noted   Status is: Inpatient:             Sabas GORMAN Brod   [facility] If 7PM-7AM, please contact night-coverage at [email or web address], Office  [phone]   05/27/2024, 8:15 AM  LOS: 0 days

## 2024-05-27 NOTE — Plan of Care (Signed)
  Problem: Pain Managment: Goal: General experience of comfort will improve and/or be controlled Outcome: Progressing   Problem: Safety: Goal: Ability to remain free from injury will improve Outcome: Progressing

## 2024-05-27 NOTE — Progress Notes (Signed)
 PHARMACY - PHYSICIAN COMMUNICATION CRITICAL VALUE ALERT - BLOOD CULTURE IDENTIFICATION (BCID)  [name] is an 53 y.o. female who presented to [facility] on 05/26/2024 with a chief complaint of Abdominal pain, N/D found to have left UPJ renal stone  Assessment:  4/4 Blood cultures with E. Coli (no R)  Name of physician (or Provider) Contacted: Dr. Charlton  Current antibiotics: Ceftriaxone  2g IV every 24 hours  Changes to prescribed antibiotics recommended:   -Continue current treatment regimen  Results for orders placed or performed during the hospital encounter of 05/26/24  Blood Culture ID Panel (Reflexed) (Collected: 05/26/2024  2:30 PM)  Result Value Ref Range   Enterococcus faecalis NOT DETECTED NOT DETECTED   Enterococcus Faecium NOT DETECTED NOT DETECTED   Listeria monocytogenes NOT DETECTED NOT DETECTED   Staphylococcus species NOT DETECTED NOT DETECTED   Staphylococcus aureus (BCID) NOT DETECTED NOT DETECTED   Staphylococcus epidermidis NOT DETECTED NOT DETECTED   Staphylococcus lugdunensis NOT DETECTED NOT DETECTED   Streptococcus species NOT DETECTED NOT DETECTED   Streptococcus agalactiae NOT DETECTED NOT DETECTED   Streptococcus pneumoniae NOT DETECTED NOT DETECTED   Streptococcus pyogenes NOT DETECTED NOT DETECTED   A.calcoaceticus-baumannii NOT DETECTED NOT DETECTED   Bacteroides fragilis NOT DETECTED NOT DETECTED   Enterobacterales DETECTED (A) NOT DETECTED   Enterobacter cloacae complex NOT DETECTED NOT DETECTED   Escherichia coli DETECTED (A) NOT DETECTED   Klebsiella aerogenes NOT DETECTED NOT DETECTED   Klebsiella oxytoca NOT DETECTED NOT DETECTED   Klebsiella pneumoniae NOT DETECTED NOT DETECTED   Proteus species NOT DETECTED NOT DETECTED   Salmonella species NOT DETECTED NOT DETECTED   Serratia marcescens NOT DETECTED NOT DETECTED   Haemophilus influenzae NOT DETECTED NOT DETECTED   Neisseria meningitidis NOT DETECTED NOT DETECTED    Pseudomonas aeruginosa NOT DETECTED NOT DETECTED   Stenotrophomonas maltophilia NOT DETECTED NOT DETECTED   Candida albicans NOT DETECTED NOT DETECTED   Candida auris NOT DETECTED NOT DETECTED   Candida glabrata NOT DETECTED NOT DETECTED   Candida krusei NOT DETECTED NOT DETECTED   Candida parapsilosis NOT DETECTED NOT DETECTED   Candida tropicalis NOT DETECTED NOT DETECTED   Cryptococcus neoformans/gattii NOT DETECTED NOT DETECTED   CTX-M ESBL NOT DETECTED NOT DETECTED   Carbapenem resistance IMP NOT DETECTED NOT DETECTED   Carbapenem resistance KPC NOT DETECTED NOT DETECTED   Carbapenem resistance NDM NOT DETECTED NOT DETECTED   Carbapenem resist OXA 48 LIKE NOT DETECTED NOT DETECTED   Carbapenem resistance VIM NOT DETECTED NOT DETECTED    Lynwood Poplar, PharmD, BCPS Clinical Pharmacist 05/27/2024 4:49 AM

## 2024-05-27 NOTE — Plan of Care (Signed)
  Problem: Clinical Measurements: Goal: Diagnostic test results will improve Outcome: Not Progressing   Problem: Activity: Goal: Risk for activity intolerance will decrease Outcome: Progressing   Problem: Nutrition: Goal: Adequate nutrition will be maintained Outcome: Progressing   Problem: Elimination: Goal: Will not experience complications related to urinary retention Outcome: Progressing   Problem: Pain Managment: Goal: General experience of comfort will improve and/or be controlled Outcome: Progressing

## 2024-05-28 ENCOUNTER — Encounter (HOSPITAL_COMMUNITY): Payer: Self-pay | Admitting: Urology

## 2024-05-28 DIAGNOSIS — E876 Hypokalemia: Secondary | ICD-10-CM

## 2024-05-28 DIAGNOSIS — N200 Calculus of kidney: Secondary | ICD-10-CM | POA: Diagnosis not present

## 2024-05-28 DIAGNOSIS — I10 Essential (primary) hypertension: Secondary | ICD-10-CM | POA: Diagnosis not present

## 2024-05-28 DIAGNOSIS — N390 Urinary tract infection, site not specified: Secondary | ICD-10-CM | POA: Diagnosis not present

## 2024-05-28 LAB — CBC
HCT: 34.6 % — ABNORMAL LOW (ref 36.0–46.0)
Hemoglobin: 11.1 g/dL — ABNORMAL LOW (ref 12.0–15.0)
MCH: 28 pg (ref 26.0–34.0)
MCHC: 32.1 g/dL (ref 30.0–36.0)
MCV: 87.2 fL (ref 80.0–100.0)
Platelets: 358 K/uL (ref 150–400)
RBC: 3.97 MIL/uL (ref 3.87–5.11)
RDW: 14.2 % (ref 11.5–15.5)
WBC: 22 K/uL — ABNORMAL HIGH (ref 4.0–10.5)
nRBC: 0 % (ref 0.0–0.2)

## 2024-05-28 LAB — COMPREHENSIVE METABOLIC PANEL WITH GFR
ALT: 40 U/L (ref 0–44)
AST: 32 U/L (ref 15–41)
Albumin: 2.6 g/dL — ABNORMAL LOW (ref 3.5–5.0)
Alkaline Phosphatase: 113 U/L (ref 38–126)
Anion gap: 7 (ref 5–15)
BUN: 12 mg/dL (ref 6–20)
CO2: 23 mmol/L (ref 22–32)
Calcium: 8.7 mg/dL — ABNORMAL LOW (ref 8.9–10.3)
Chloride: 104 mmol/L (ref 98–111)
Creatinine, Ser: 0.72 mg/dL (ref 0.44–1.00)
GFR, Estimated: >60 mL/min (ref >60)
Glucose, Bld: 103 mg/dL — ABNORMAL HIGH (ref 70–99)
Potassium: 3.7 mmol/L (ref 3.5–5.1)
Sodium: 134 mmol/L — ABNORMAL LOW (ref 135–145)
Total Bilirubin: 0.5 mg/dL (ref 0.0–1.2)
Total Protein: 6.8 g/dL (ref 6.5–8.1)

## 2024-05-28 LAB — URINE CULTURE: Culture: >=100000 — AB

## 2024-05-28 LAB — MAGNESIUM: Magnesium: 2 mg/dL (ref 1.7–2.4)

## 2024-05-28 MED ORDER — PREDNISOLONE ACETATE 1 % OP SUSP
1.0000 [drp] | Freq: Four times a day (QID) | OPHTHALMIC | Status: DC
  Administered 2024-05-28 – 2024-05-29 (×6): 1 [drp] via OPHTHALMIC
  Filled 2024-05-28: qty 5

## 2024-05-28 MED ORDER — ENOXAPARIN SODIUM 40 MG/0.4ML IJ SOSY
40.0000 mg | PREFILLED_SYRINGE | Freq: Every day | INTRAMUSCULAR | Status: DC
  Administered 2024-05-28 – 2024-05-29 (×2): 40 mg via SUBCUTANEOUS
  Filled 2024-05-28 (×2): qty 0.4

## 2024-05-28 NOTE — Anesthesia Postprocedure Evaluation (Signed)
 Anesthesia Post Note  Patient: [name]  Procedure(s) Performed: CYSTOSCOPY, WITH RETROGRADE PYELOGRAM AND URETERAL STENT INSERTION (Left)     Patient location during evaluation: PACU Anesthesia Type: General Level of consciousness: awake and alert Pain management: pain level controlled Vital Signs Assessment: post-procedure vital signs reviewed and stable Respiratory status: spontaneous breathing, nonlabored ventilation, respiratory function stable and patient connected to nasal cannula oxygen Cardiovascular status: blood pressure returned to baseline and stable Postop Assessment: no apparent nausea or vomiting Anesthetic complications: no   There were no known notable events for this encounter.  Last Vitals:  Vitals:   05/28/24 0400 05/28/24 0920  BP: 103/68 109/72  Pulse: 91 (!) 102  Resp: 18 18  Temp: 37.1 C 36.7 C  SpO2: 99% 98%    Last Pain:  Vitals:   05/28/24 0911  TempSrc:   PainSc: 4                  Shloima Clinch S

## 2024-05-28 NOTE — Progress Notes (Signed)
 [facility]  PROGRESS NOTE  Zoee Heeney FMW:[id number] DOB: 22-Jun-1971 DOA: 05/26/2024 PCP: Mason Haywood LABOR, PA-C   Brief HPI:    53 y.o. female with medical history significant of hypertension, hyperlipidemia, depression, anxiety, cataract, macular degeneration presenting with abdominal pain, nausea, diarrhea.   History obtained with assistance of chart review and translation; family member assisted with translation per patient preference.  Patient has had left-sided abdominal pain for the past day or so.  Pain described as dull and constant.  Pain does not radiate.  Had several episodes of diarrhea today as well as an episode of nausea and vomiting.  No blood in either.  Denies any fevers, dysuria, hematuria at home. CT on pelvis showed obstructing left UPJ calculus with high-grade obstructive uropathy. Cannot exclude superimposed infection. Also noted were fibroids, diverticulosis without diverticulitis and a small hiatal hernia.    Assessment/Plan:   Obstructive uropathy; status post left ureteral stent placement  Left renal stone -Presented with abdominal pain, nausea and diarrhea; found to have an obstructing left UPJ renal stone with high-grade obstructive uropathy -Urology consulted, recommended stent placement with antibiotics -Patient underwent left ureteral stent placement -Started on empiric Rocephin   Gram-negative rod bacteremia -Blood cultures x 2 growing gram-negative rod  -Blood culture growing E. coli, susceptibilities are pending -Continue Rocephin  as above - WBC down to 22,000  E. coli UTI -Urine culture grew 100,000 colonies of E. Coli -Antibiotics as above   Hypertension - Blood pressure is soft, will hold HCTZ/lisinopril  Started on hydralazine  25 mg p.o. every 6 hours as needed   Cataracts Macular degeneration - Resume home eyedrops   Hypomagnesemia - Replete  Medications     bacitracin -polymyxin b    Right Eye QID   ketorolac    1 drop Left Eye QID   sodium chloride  flush  3 mL Intravenous Q12H     Data Reviewed:   CBG:  No results for input(s): GLUCAP in the last 168 hours.  SpO2: 99 %    Vitals:   05/27/24 1636 05/27/24 1938 05/28/24 0000 05/28/24 0400  BP: 112/73 (!) 98/56 (!) 90/57 103/68  Pulse: (!) 104 (!) 110 91 91  Resp: 17 16 18 18   Temp: 98.5 F (36.9 C) 98.9 F (37.2 C) 98.8 F (37.1 C) 98.8 F (37.1 C)  TempSrc: Oral Oral Oral Oral  SpO2: 100% 99% 95% 99%  Weight:      Height:          Data Reviewed:  Basic Metabolic Panel: Recent Labs  Lab 05/26/24 0913 05/26/24 1900 05/27/24 0742 05/28/24 0448  NA 137  --  140 134*  K 3.1*  --  3.4* 3.7  CL 104  --  105 104  CO2 20*  --  23 23  GLUCOSE 148*  --  121* 103*  BUN 21*  --  17 12  CREATININE 0.99  --  0.79 0.72  CALCIUM 9.5  --  8.9 8.7*  MG  --  1.2*  --  2.0    CBC: Recent Labs  Lab 05/26/24 0913 05/27/24 0742 05/28/24 0448  WBC 10.8* 33.2* 22.0*  HGB 12.7 10.5* 11.1*  HCT 39.7 32.4* 34.6*  MCV 88.8 87.1 87.2  PLT 551* 379 358    LFT Recent Labs  Lab 05/26/24 0913 05/27/24 0742 05/28/24 0448  AST 31 21 32  ALT 35 28 40  ALKPHOS 108 89 113  BILITOT 1.0 0.6 0.5  PROT 7.7 6.8 6.8  ALBUMIN 3.5 2.6* 2.6*  Antibiotics: Anti-infectives (From admission, onward)    Start     Dose/Rate Route Frequency Ordered Stop   05/27/24 1000  cefTRIAXone  (ROCEPHIN ) 2 g in sodium chloride  0.9 % 100 mL IVPB        2 g 200 mL/hr over 30 Minutes Intravenous Every 24 hours 05/26/24 1427     05/26/24 1330  cefTRIAXone  (ROCEPHIN ) 2 g in sodium chloride  0.9 % 100 mL IVPB        2 g 200 mL/hr over 30 Minutes Intravenous  Once 05/26/24 1318 05/26/24 1519        DVT prophylaxis: SCDs  Code Status: Full code  Family Communication: No family at bedside   CONSULTS urology   Subjective   Denies any complaints   Objective    Physical Examination:   Appears in no acute distress S1-S2, regular Lungs  clear to auscultation bilaterally Abdomen is soft, nontender, no organomegaly   Status is: Inpatient:             Sabas GORMAN Brod   [facility] If 7PM-7AM, please contact night-coverage at [email or web address], Office  [phone]   05/28/2024, 8:50 AM  LOS: 1 day

## 2024-05-28 NOTE — Plan of Care (Signed)
  Problem: Pain Managment: Goal: General experience of comfort will improve and/or be controlled Outcome: Progressing   Problem: Safety: Goal: Ability to remain free from injury will improve Outcome: Progressing

## 2024-05-28 NOTE — TOC Initial Note (Signed)
 Transition of Care [facility]) - Initial/Assessment Note    Patient Details  Name: [name] MRN: [id number] Date of Birth: November 04, 1970  [facility] CM/SW Contact:    Jeoffrey LITTIE Moose, [place] Phone Number: 05/28/2024, 9:36 AM  Clinical Narrative:                 Pt admitted from home due to lower abdomen pain. No current TOC needs, please consult as needs arise.        Patient Goals and CMS Choice            Expected Discharge Plan and Services                                              Prior Living Arrangements/Services                       Activities of Daily Living   ADL Screening (condition at time of admission) Independently performs ADLs?: Yes (appropriate for developmental age) Is the patient deaf or have difficulty hearing?: No Does the patient have difficulty seeing, even when wearing glasses/contacts?: No Does the patient have difficulty concentrating, remembering, or making decisions?: No  Permission Sought/Granted                  Emotional Assessment              Admission diagnosis:  Dehydration [E86.0] Hypokalemia [E87.6] Left flank pain [R10.9] Acute UTI [N39.0] Acute febrile illness [R50.9] Nausea vomiting and diarrhea [R11.2, R19.7] Complicated UTI (urinary tract infection) [N39.0] Left ureteral stone [N20.1] Patient Active Problem List   Diagnosis Date Noted   Left renal stone 05/26/2024   Obstructive uropathy 05/26/2024   Complicated UTI (urinary tract infection) 05/26/2024   Cervical dysplasia 06/30/2023   Vaginal dysplasia 06/30/2023   Cataract 05/15/2021   Exudative age-related macular degeneration (HCC) 05/15/2021   Dyslipidemia 09/28/2019   Elevated platelet count (HCC) 07/31/2015   Elevated LFTs 07/31/2015   Menstrual migraine without status migrainosus, not intractable 06/27/2015   CIN I (cervical intraepithelial neoplasia I) 06/16/2012   History of sciatica 06/16/2012    Depression 06/16/2012   Anxiety 06/16/2012   Hypertension 06/16/2011   Fibroid uterus 06/16/2011   PCP:  Mason Haywood LABOR, PA-C Pharmacy:   [facility] [address], [place] - [address] [place] [address] Phone: [phone] Fax: [phone]     Social Drivers of Health (SDOH) Social History: SDOH Screenings   Food Insecurity: No Food Insecurity (05/26/2024)  Housing: Low Risk  (05/26/2024)  Transportation Needs: No Transportation Needs (05/26/2024)  Utilities: Not At Risk (05/26/2024)  Depression (PHQ2-9): Low Risk  (03/12/2024)  Social Connections: Unknown (05/26/2024)  Tobacco Use: Low Risk  (05/27/2024)   SDOH Interventions:     Readmission Risk Interventions     No data to display

## 2024-05-29 DIAGNOSIS — R109 Unspecified abdominal pain: Secondary | ICD-10-CM | POA: Diagnosis not present

## 2024-05-29 DIAGNOSIS — E860 Dehydration: Secondary | ICD-10-CM

## 2024-05-29 DIAGNOSIS — N390 Urinary tract infection, site not specified: Secondary | ICD-10-CM | POA: Diagnosis not present

## 2024-05-29 DIAGNOSIS — N201 Calculus of ureter: Secondary | ICD-10-CM

## 2024-05-29 LAB — CULTURE, BLOOD (ROUTINE X 2)
Special Requests: ADEQUATE
Special Requests: ADEQUATE

## 2024-05-29 LAB — CBC
HCT: 34.4 % — ABNORMAL LOW (ref 36.0–46.0)
Hemoglobin: 11.2 g/dL — ABNORMAL LOW (ref 12.0–15.0)
MCH: 27.9 pg (ref 26.0–34.0)
MCHC: 32.6 g/dL (ref 30.0–36.0)
MCV: 85.6 fL (ref 80.0–100.0)
Platelets: 364 K/uL (ref 150–400)
RBC: 4.02 MIL/uL (ref 3.87–5.11)
RDW: 13.9 % (ref 11.5–15.5)
WBC: 13.2 K/uL — ABNORMAL HIGH (ref 4.0–10.5)
nRBC: 0 % (ref 0.0–0.2)

## 2024-05-29 LAB — COMPREHENSIVE METABOLIC PANEL WITH GFR
ALT: 38 U/L (ref 0–44)
AST: 26 U/L (ref 15–41)
Albumin: 2.6 g/dL — ABNORMAL LOW (ref 3.5–5.0)
Alkaline Phosphatase: 155 U/L — ABNORMAL HIGH (ref 38–126)
Anion gap: 11 (ref 5–15)
BUN: 11 mg/dL (ref 6–20)
CO2: 22 mmol/L (ref 22–32)
Calcium: 9.1 mg/dL (ref 8.9–10.3)
Chloride: 104 mmol/L (ref 98–111)
Creatinine, Ser: 0.59 mg/dL (ref 0.44–1.00)
GFR, Estimated: >60 mL/min (ref >60)
Glucose, Bld: 100 mg/dL — ABNORMAL HIGH (ref 70–99)
Potassium: 3.7 mmol/L (ref 3.5–5.1)
Sodium: 137 mmol/L (ref 135–145)
Total Bilirubin: 0.4 mg/dL (ref 0.0–1.2)
Total Protein: 7.1 g/dL (ref 6.5–8.1)

## 2024-05-29 MED ORDER — HYDROCODONE-ACETAMINOPHEN 5-325 MG PO TABS: 1.0000 | ORAL_TABLET | Freq: Four times a day (QID) | ORAL | 0 refills | Status: AC | PRN

## 2024-05-29 MED ORDER — AMOXICILLIN 500 MG PO CAPS: 1000.0000 mg | ORAL_CAPSULE | Freq: Three times a day (TID) | ORAL | 0 refills | Status: AC

## 2024-05-29 NOTE — Plan of Care (Signed)

## 2024-05-29 NOTE — Progress Notes (Signed)
   3 Days Post-Op Subjective: No acute events overnight.  Patient continues to improve.  Reviewed case and plan with the aid of a video interpreter.  All questions were answered to her satisfaction.  Objective: Vital signs in last 24 hours: Temp:  [98 F (36.7 C)-99 F (37.2 C)] 98 F (36.7 C) (08/19 0933) Pulse Rate:  [95-105] 95 (08/19 0933) Resp:  [16-18] 17 (08/19 0933) BP: (107-118)/(72-89) 117/79 (08/19 0933) SpO2:  [98 %-100 %] 100 % (08/19 0933)  Assessment/Plan: # Ureteral stone # E. coli UTI # E. coli bacteremia  S/p ureteral stent placement with Dr. Devere on 05/26/2024. Significant leukocytosis largely resolved as of today.  Patient remains afebrile. Remains on broad ABX.  Pansensitive bacteria.  Consider transitioning to oral treatment. Definitive stone management on an outpatient basis in the next few weeks. Okay to discharge from urologic perspective.  Please call with questions  Intake/Output from previous day: 08/18 0701 - 08/19 0700 In: 720 [P.O.:720] Out: -   Intake/Output this shift: No intake/output data recorded.  Physical Exam:  General: Alert and oriented CV: No cyanosis Lungs: equal chest rise Abdomen: Soft, NTND, no rebound or guarding  Lab Results: Recent Labs    05/27/24 0742 05/28/24 0448 05/29/24 0702  HGB 10.5* 11.1* 11.2*  HCT 32.4* 34.6* 34.4*   BMET Recent Labs    05/28/24 0448 05/29/24 0702  NA 134* 137  K 3.7 3.7  CL 104 104  CO2 23 22  GLUCOSE 103* 100*  BUN 12 11  CREATININE 0.72 0.59  CALCIUM 8.7* 9.1  HGB 11.1* 11.2*  WBC 22.0* 13.2*     Studies/Results: No results found.    LOS: 2 days   Ole Bourdon, NP [facility] Pager: [phone]  05/29/2024, 2:48 PM

## 2024-05-29 NOTE — Discharge Summary (Signed)
 Physician Discharge Summary   Patient: [name] MRN: [id number] DOB: 1970-12-24  Admit date:     05/26/2024  Discharge date: 05/29/24  Discharge Physician: Sabas GORMAN Brod   PCP: Mason Haywood LABOR, PA-C   Recommendations at discharge:   Follow-up PCP in 2 weeks Follow-up with urology in 1 week Stop taking vitamin D  tablets.  Check vitamin D  level in 2 weeks Stop taking blood pressure medications as your blood pressure is low.  Check blood pressure daily at home and follow-up PCP for further recommendations. Take amoxicillin  1 g p.o. 3 times daily for 10 more days  Discharge Diagnoses: Principal Problem:   Complicated UTI (urinary tract infection) Active Problems:   Hypertension   Depression   Anxiety   Cataract   Dyslipidemia   Exudative age-related macular degeneration (HCC)   Left renal stone   Obstructive uropathy  Resolved Problems:   * No resolved hospital problems. *  Hospital Course: 53 y.o. female with medical history significant of hypertension, hyperlipidemia, depression, anxiety, cataract, macular degeneration presenting with abdominal pain, nausea, diarrhea.   History obtained with assistance of chart review and translation; family member assisted with translation per patient preference.  Patient has had left-sided abdominal pain for the past day or so.  Pain described as dull and constant.  Pain does not radiate.  Had several episodes of diarrhea today as well as an episode of nausea and vomiting.  No blood in either.  Denies any fevers, dysuria, hematuria at home. CT on pelvis showed obstructing left UPJ calculus with high-grade obstructive uropathy. Cannot exclude superimposed infection. Also noted were fibroids, diverticulosis without diverticulitis and a small hiatal hernia.   Assessment and Plan:  Obstructive uropathy; status post left ureteral stent placement  Left renal stone -Presented with abdominal pain, nausea and diarrhea; found to  have an obstructing left UPJ renal stone with high-grade obstructive uropathy -Urology consulted, recommended stent placement with antibiotics -Patient underwent left ureteral stent placement -Started on empiric Rocephin  -Will discharge amoxicillin  1 g p.o. 3 times daily for 10 more days   Gram-negative rod bacteremia -Blood cultures x 2 growing gram-negative rod .  E. coli sensitive to amoxicillin  -Blood culture growing E. coli, susceptibilities show sensitive to amoxicillin  - Started on  Rocephin  as above - WBC down to 13,000 -Will discharge on amoxicillin  1 g p.o. 3 times daily for 10 more days   E. coli UTI -Urine culture grew 100,000 colonies of E. Coli -Antibiotics as above   Hypertension - Blood pressure is soft, will hold HCTZ/lisinopril     Cataracts Macular degeneration - Resume home eyedrops    Hypomagnesemia - Replete  Patient takes vitamin D  supplementation, will stop vitamin D  supplementation as it could have caused kidney stone.  She will need to check with PCP and check vitamin D  level in 2 weeks.  Patient said that she has been taking vitamin D  50,000 units every week since June of this year.      Consultants: Urology Procedures performed: Ureteral stent placement Disposition: Home Diet recommendation:  Discharge Diet Orders (From admission, onward)     Start     Ordered   05/29/24 0000  Diet - low sodium heart healthy        05/29/24 1748           Regular diet DISCHARGE MEDICATION: Allergies as of 05/29/2024       Reactions   Pollen Extract Itching   POLLEN EXTRACTS  Medication List     STOP taking these medications    acetaminophen  500 MG tablet Commonly known as: TYLENOL    Cholecalciferol 1.25 MG (50000 UT) capsule   hydrochlorothiazide  25 MG tablet Commonly known as: HYDRODIURIL    ibuprofen  200 MG tablet Commonly known as: ADVIL    lisinopril  20 MG tablet Commonly known as: ZESTRIL        TAKE these medications     amoxicillin  500 MG capsule Commonly known as: AMOXIL  Take 2 capsules (1,000 mg total) by mouth 3 (three) times daily for 10 days.   bacitracin -polymyxin b  ophthalmic ointment Commonly known as: POLYSPORIN  Place into the right eye 4 (four) times daily.   HYDROcodone -acetaminophen  5-325 MG tablet Commonly known as: NORCO/VICODIN Take 1-2 tablets by mouth every 6 (six) hours as needed for moderate pain (pain score 4-6) or severe pain (pain score 7-10).   ketorolac  0.5 % ophthalmic solution Commonly known as: ACULAR  Place 1 drop into the left eye 4 (four) times daily.   prednisoLONE  acetate 1 % ophthalmic suspension Commonly known as: PRED FORTE  Place 1 drop into the left eye 4 (four) times daily.        Follow-up Information     Devere Lonni Righter, MD. Schedule an appointment as soon as possible for a visit in 1 week(s).   Specialty: Urology Contact information: [address] [place] [address] [place] [address] [phone]                Discharge Exam: Fredricka Weights   05/26/24 0910  Weight: 70.3 kg   General-appears in no acute distress Heart-S1-S2, regular, no murmur auscultated Lungs-clear to auscultation bilaterally, no wheezing or crackles auscultated Abdomen-soft, nontender, no organomegaly Extremities-no edema in the lower extremities Neuro-alert, oriented x3, no focal deficit noted  Condition at discharge: good  The results of significant diagnostics from this hospitalization (including imaging, microbiology, ancillary and laboratory) are listed below for reference.   Imaging Studies: DG C-Arm 1-60 Min-No Report Result Date: 05/26/2024 Fluoroscopy was utilized by the requesting physician.  No radiographic interpretation.   CT ABDOMEN PELVIS W CONTRAST Result Date: 05/26/2024 CLINICAL DATA:  Lower abdominal pain, vomiting, diarrhea, chills EXAM: CT ABDOMEN AND PELVIS WITH CONTRAST TECHNIQUE: Multidetector CT imaging of the abdomen and pelvis  was performed using the standard protocol following bolus administration of intravenous contrast. RADIATION DOSE REDUCTION: This exam was performed according to the departmental dose-optimization program which includes automated exposure control, adjustment of the mA and/or kV according to patient size and/or use of iterative reconstruction technique. CONTRAST:  75mL OMNIPAQUE  IOHEXOL  350 MG/ML SOLN COMPARISON:  None Available. FINDINGS: Lower chest: No acute pleural or parenchymal lung disease. Hepatobiliary: Multiple simple appearing hepatic cysts. Liver is otherwise unremarkable. No evidence of cholelithiasis or cholecystitis. No biliary duct dilation. Pancreas: Unremarkable. No pancreatic ductal dilatation or surrounding inflammatory changes. Spleen: Normal in size without focal abnormality. Adrenals/Urinary Tract: There is an obstructing 6 mm calculus within the proximal left ureter at the UPJ, reference image 42/3. There is moderate left hydronephrosis with severe left renal edema and perinephric fat stranding compatible with high-grade obstruction. Superimposed infection cannot be excluded. The right kidney is unremarkable. No right-sided calculi or obstructive uropathy. The adrenals and bladder appear unremarkable. Stomach/Bowel: No bowel obstruction or ileus. Normal appendix right lower quadrant. Scattered colonic diverticulosis without diverticulitis. Small hiatal hernia. No bowel wall thickening or inflammatory change. Vascular/Lymphatic: No significant vascular findings are present. No enlarged abdominal or pelvic lymph nodes. Reproductive: Heterogeneous appearance of the uterus consistent  with multiple fibroids. No adnexal masses. Other: No free fluid or free intraperitoneal gas. No abdominal wall hernia. Musculoskeletal: No acute or destructive bony abnormalities. Reconstructed images demonstrate no additional findings. IMPRESSION: 1. Obstructing 6 mm left UPJ calculus, with high-grade left-sided  obstructive uropathy. Superimposed infection cannot be excluded. 2. Scattered colonic diverticulosis without diverticulitis. 3. Fibroid uterus. 4. Small hiatal hernia. Electronically Signed   By: Ozell Daring M.D.   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 Laser settings: Lumenis Smart532 laser, slit lamp Lens: Mainster PRP 165 Power: 260 mW Spot size: 400 microns Duration: 30 msec # spots: 719 Placement of laser: Using a Mainster PRP 165 contact lens at the slit lamp, laser was placed in three+ confluent rows around focal SRF at 0400 and posterior to ST schisis cavity from 0100-0200. Complications: None. Patient tolerated the procedure well and received written and verbal post-procedure care information/education.  OCT, Retina - OU - Both Eyes Result Date: 05/24/2024 Right Eye Quality was borderline. Central Foveal Thickness: 242. Progression has improved. Findings include no IRF, no SRF, abnormal foveal contour, subretinal hyper-reflective material, epiretinal membrane, pigment epithelial detachment, outer retinal atrophy (Better image quality, retina stably reattached, diffuse atrophy ). Left Eye Quality was good. Central Foveal Thickness: 297. Progression has worsened. Findings include normal foveal contour, no IRF, subretinal fluid, vitreomacular adhesion (bullous schisis cavity IT periphery w/ new SRF caught on widefield). Notes *Images captured and stored  on drive Diagnosis / Impression: OD: Better image quality, retina stably reattached, diffuse atrophy OS: bullous schisis cavity IT periphery w/ new SRF caught on widefield Clinical management: See below Abbreviations: NFP - Normal foveal profile. CME - cystoid macular edema. PED - pigment epithelial detachment. IRF - intraretinal fluid. SRF - subretinal fluid. EZ - ellipsoid zone. ERM - epiretinal membrane. ORA - outer retinal atrophy. ORT - outer retinal tubulation. SRHM - subretinal hyper-reflective material   US  Abdomen Complete Result Date: 05/04/2024 CLINICAL DATA:  elevated liver enzymes EXAM: ABDOMEN ULTRASOUND COMPLETE COMPARISON:  None Available. FINDINGS: Gallbladder: No gallstones or wall thickening visualized. No sonographic Murphy sign noted by sonographer. Common bile duct: Diameter: Visualized portion measures 6 mm, within normal limits. Liver: Multiple scattered small benign cysts are noted measuring up to 1.7 cm. Within normal limits in parenchymal echogenicity. Portal vein is patent on color Doppler imaging with normal direction of blood flow towards the liver. IVC: No abnormality visualized. Pancreas: Visualized portion unremarkable. Spleen: Size and appearance within normal limits. Right Kidney: Length: 10.4 cm. Echogenicity within normal limits. No mass or hydronephrosis visualized. Left Kidney: Length: 11.3 cm. Echogenicity within normal limits. No mass or hydronephrosis visualized. Portions are suboptimally assessed secondary to shadowing bowel gas. Abdominal aorta: No aneurysm visualized. Other findings: None. IMPRESSION: No sonographic etiology  for elevated liver enzymes is identified. Electronically Signed   By: Corean Salter M.D.   On: 05/04/2024 08:09    Microbiology: Results for orders placed or performed during the hospital encounter of 05/26/24  Urine Culture     Status: Abnormal   Collection Time: 05/26/24  1:16 PM   Specimen: Urine, Clean Catch  Result Value Ref Range  Status   Specimen Description URINE, CLEAN CATCH  Final   Special Requests   Final    NONE Performed at [facility], [address] [place], [place] [address]    Culture >=100,000 COLONIES/mL ESCHERICHIA COLI (A)  Final   Report Status 05/28/2024 FINAL  Final   Organism ID, Bacteria ESCHERICHIA COLI (A)  Final      Susceptibility   Escherichia coli - MIC*    AMPICILLIN <=2 SENSITIVE Sensitive     CEFAZOLIN (URINE) Value in next row Sensitive      2 SENSITIVEThis is a modified FDA-approved test that has been validated and its performance characteristics determined by the reporting laboratory.  This laboratory is certified under the Clinical Laboratory Improvement Amendments CLIA as qualified to perform high complexity clinical laboratory testing.    CEFEPIME Value in next row Sensitive      2 SENSITIVEThis is a modified FDA-approved test that has been validated and its performance characteristics determined by the reporting laboratory.  This laboratory is certified under the Clinical Laboratory Improvement Amendments CLIA as qualified to perform high complexity clinical laboratory testing.    ERTAPENEM Value in next row Sensitive      2 SENSITIVEThis is a modified FDA-approved test that has been validated and its performance characteristics determined by the reporting laboratory.  This laboratory is certified under the Clinical Laboratory Improvement Amendments CLIA as qualified to perform high complexity clinical laboratory testing.    CEFTRIAXONE  Value in next row Sensitive      2 SENSITIVEThis is a modified FDA-approved test that has been validated and its performance characteristics determined by the reporting laboratory.  This laboratory is certified under the Clinical Laboratory Improvement Amendments CLIA as qualified to perform high complexity clinical laboratory testing.    CIPROFLOXACIN Value in next row Sensitive      2 SENSITIVEThis is a modified FDA-approved test that has  been validated and its performance characteristics determined by the reporting laboratory.  This laboratory is certified under the Clinical Laboratory Improvement Amendments CLIA as qualified to perform high complexity clinical laboratory testing.    GENTAMICIN Value in next row Sensitive      2 SENSITIVEThis is a modified FDA-approved test that has been validated and its performance characteristics determined by the reporting laboratory.  This laboratory is certified under the Clinical Laboratory Improvement Amendments CLIA as qualified to perform high complexity clinical laboratory testing.    NITROFURANTOIN  Value in next row Sensitive      2 SENSITIVEThis is a modified FDA-approved test that has been validated and its performance characteristics determined by the reporting laboratory.  This laboratory is certified under the Clinical Laboratory Improvement Amendments CLIA as qualified to perform high complexity clinical laboratory testing.    TRIMETH/SULFA Value in next row Sensitive      2 SENSITIVEThis is a modified FDA-approved test that has been validated and its performance characteristics determined by the reporting laboratory.  This laboratory is certified under the Clinical Laboratory Improvement Amendments CLIA as qualified to perform high complexity clinical laboratory testing.    AMPICILLIN/SULBACTAM Value in next row Sensitive  2 SENSITIVEThis is a modified FDA-approved test that has been validated and its performance characteristics determined by the reporting laboratory.  This laboratory is certified under the Clinical Laboratory Improvement Amendments CLIA as qualified to perform high complexity clinical laboratory testing.    PIP/TAZO Value in next row Sensitive ug/mL     <=4 SENSITIVEThis is a modified FDA-approved test that has been validated and its performance characteristics determined by the reporting laboratory.  This laboratory is certified under the Clinical Laboratory  Improvement Amendments CLIA as qualified to perform high complexity clinical laboratory testing.    MEROPENEM Value in next row Sensitive      <=4 SENSITIVEThis is a modified FDA-approved test that has been validated and its performance characteristics determined by the reporting laboratory.  This laboratory is certified under the Clinical Laboratory Improvement Amendments CLIA as qualified to perform high complexity clinical laboratory testing.    * >=100,000 COLONIES/mL ESCHERICHIA COLI  Blood culture (routine x 2)     Status: Abnormal   Collection Time: 05/26/24  2:30 PM   Specimen: BLOOD  Result Value Ref Range Status   Specimen Description BLOOD LEFT ANTECUBITAL  Final   Special Requests   Final    BOTTLES DRAWN AEROBIC AND ANAEROBIC Blood Culture adequate volume   Culture  Setup Time   Final    GRAM NEGATIVE RODS IN BOTH AEROBIC AND ANAEROBIC BOTTLES CRITICAL RESULT CALLED TO, READ BACK BY AND VERIFIED WITH: PHARMD J [facility] 05/27/2024 @ 0438 BY AB Performed at [facility], [address], [place], [place] [address]    Culture ESCHERICHIA COLI (A)  Final   Report Status 05/29/2024 FINAL  Final   Organism ID, Bacteria ESCHERICHIA COLI  Final      Susceptibility   Escherichia coli - MIC*    AMPICILLIN <=2 SENSITIVE Sensitive     CEFAZOLIN (NON-URINE) 2 SENSITIVE Sensitive     CEFEPIME <=0.12 SENSITIVE Sensitive     ERTAPENEM <=0.12 SENSITIVE Sensitive     CEFTRIAXONE  <=0.25 SENSITIVE Sensitive     CIPROFLOXACIN <=0.06 SENSITIVE Sensitive     GENTAMICIN <=1 SENSITIVE Sensitive     MEROPENEM <=0.25 SENSITIVE Sensitive     TRIMETH/SULFA <=20 SENSITIVE Sensitive     AMPICILLIN/SULBACTAM <=2 SENSITIVE Sensitive     PIP/TAZO Value in next row Sensitive ug/mL     <=4 SENSITIVEThis is a modified FDA-approved test that has been validated and its performance characteristics determined by the reporting laboratory.  This laboratory is certified under the Clinical Laboratory  Improvement Amendments CLIA as qualified to perform high complexity clinical laboratory testing.    * ESCHERICHIA COLI  Blood Culture ID Panel (Reflexed)     Status: Abnormal   Collection Time: 05/26/24  2:30 PM  Result Value Ref Range Status   Enterococcus faecalis NOT DETECTED NOT DETECTED Final   Enterococcus Faecium NOT DETECTED NOT DETECTED Final   Listeria monocytogenes NOT DETECTED NOT DETECTED Final   Staphylococcus species NOT DETECTED NOT DETECTED Final   Staphylococcus aureus (BCID) NOT DETECTED NOT DETECTED Final   Staphylococcus epidermidis NOT DETECTED NOT DETECTED Final   Staphylococcus lugdunensis NOT DETECTED NOT DETECTED Final   Streptococcus species NOT DETECTED NOT DETECTED Final   Streptococcus agalactiae NOT DETECTED NOT DETECTED Final   Streptococcus pneumoniae NOT DETECTED NOT DETECTED Final   Streptococcus pyogenes NOT DETECTED NOT DETECTED Final   A.calcoaceticus-baumannii NOT DETECTED NOT DETECTED Final   Bacteroides fragilis NOT DETECTED NOT DETECTED Final   Enterobacterales DETECTED (A) NOT  DETECTED Final    Comment: Enterobacterales represent a large order of gram negative bacteria, not a single organism. CRITICAL RESULT CALLED TO, READ BACK BY AND VERIFIED WITH: PHARMD J [facility] 05/27/2024 @ 0438 BY AB    Enterobacter cloacae complex NOT DETECTED NOT DETECTED Final   Escherichia coli DETECTED (A) NOT DETECTED Final    Comment: CRITICAL RESULT CALLED TO, READ BACK BY AND VERIFIED WITH: PHARMD J [facility] 05/27/2024 @ 0438 BY AB    Klebsiella aerogenes NOT DETECTED NOT DETECTED Final   Klebsiella oxytoca NOT DETECTED NOT DETECTED Final   Klebsiella pneumoniae NOT DETECTED NOT DETECTED Final   Proteus species NOT DETECTED NOT DETECTED Final   Salmonella species NOT DETECTED NOT DETECTED Final   Serratia marcescens NOT DETECTED NOT DETECTED Final   Haemophilus influenzae NOT DETECTED NOT DETECTED Final   Neisseria meningitidis NOT DETECTED NOT DETECTED Final    Pseudomonas aeruginosa NOT DETECTED NOT DETECTED Final   Stenotrophomonas maltophilia NOT DETECTED NOT DETECTED Final   Candida albicans NOT DETECTED NOT DETECTED Final   Candida auris NOT DETECTED NOT DETECTED Final   Candida glabrata NOT DETECTED NOT DETECTED Final   Candida krusei NOT DETECTED NOT DETECTED Final   Candida parapsilosis NOT DETECTED NOT DETECTED Final   Candida tropicalis NOT DETECTED NOT DETECTED Final   Cryptococcus neoformans/gattii NOT DETECTED NOT DETECTED Final   CTX-M ESBL NOT DETECTED NOT DETECTED Final   Carbapenem resistance IMP NOT DETECTED NOT DETECTED Final   Carbapenem resistance KPC NOT DETECTED NOT DETECTED Final   Carbapenem resistance NDM NOT DETECTED NOT DETECTED Final   Carbapenem resist OXA 48 LIKE NOT DETECTED NOT DETECTED Final   Carbapenem resistance VIM NOT DETECTED NOT DETECTED Final    Comment: Performed at [facility], [address] [place], [place] [address]  Blood culture (routine x 2)     Status: Abnormal   Collection Time: 05/26/24  2:44 PM   Specimen: BLOOD  Result Value Ref Range Status   Specimen Description BLOOD LEFT ANTECUBITAL  Final   Special Requests   Final    BOTTLES DRAWN AEROBIC AND ANAEROBIC Blood Culture adequate volume   Culture  Setup Time   Final    GRAM NEGATIVE RODS IN BOTH AEROBIC AND ANAEROBIC BOTTLES CRITICAL VALUE NOTED.  VALUE IS CONSISTENT WITH PREVIOUSLY REPORTED AND CALLED VALUE.    Culture (A)  Final    ESCHERICHIA COLI SUSCEPTIBILITIES PERFORMED ON PREVIOUS CULTURE WITHIN THE LAST 5 DAYS. Performed at [facility], [address], [place], [place] [address]    Report Status 05/29/2024 FINAL  Final    Labs: CBC: Recent Labs  Lab 05/26/24 0913 05/27/24 0742 05/28/24 0448 05/29/24 0702  WBC 10.8* 33.2* 22.0* 13.2*  HGB 12.7 10.5* 11.1* 11.2*  HCT 39.7 32.4* 34.6* 34.4*  MCV 88.8 87.1 87.2 85.6  PLT 551* 379 358 364   Basic Metabolic Panel: Recent Labs  Lab 05/26/24 0913  05/26/24 1900 05/27/24 0742 05/28/24 0448 05/29/24 0702  NA 137  --  140 134* 137  K 3.1*  --  3.4* 3.7 3.7  CL 104  --  105 104 104  CO2 20*  --  23 23 22   GLUCOSE 148*  --  121* 103* 100*  BUN 21*  --  17 12 11   CREATININE 0.99  --  0.79 0.72 0.59  CALCIUM 9.5  --  8.9 8.7* 9.1  MG  --  1.2*  --  2.0  --  Liver Function Tests: Recent Labs  Lab 05/26/24 0913 05/27/24 0742 05/28/24 0448 05/29/24 0702  AST 31 21 32 26  ALT 35 28 40 38  ALKPHOS 108 89 113 155*  BILITOT 1.0 0.6 0.5 0.4  PROT 7.7 6.8 6.8 7.1  ALBUMIN 3.5 2.6* 2.6* 2.6*   CBG: No results for input(s): GLUCAP in the last 168 hours.  Discharge time spent: greater than 30 minutes.  Signed: Sabas GORMAN Brod, MD [facility] 05/29/2024

## 2024-05-29 NOTE — Plan of Care (Signed)

## 2024-05-30 ENCOUNTER — Encounter (INDEPENDENT_AMBULATORY_CARE_PROVIDER_SITE_OTHER): Admitting: Ophthalmology

## 2024-06-05 ENCOUNTER — Other Ambulatory Visit: Payer: Self-pay | Admitting: Urology

## 2024-06-06 NOTE — Patient Instructions (Signed)
 DEBIDO AL COVID-19 SLO SE PERMITEN DOS VISITANTES (de 16 aos en adelante)  PARA QUE VENGAN CON USTED Y SE QUEDEN EN LA SALA DE ESPERA SOLAMENTE DURANTE EL PRE OP Y EL PROCEDIMIENTO.   **NO SE PERMITEN VISITAS EN EL REA DE CORTA ESTADA NI EN LA SALA DE RECUPERACIN!!**  SI VA A SER INGRESADO(A) AL HOSPITAL SLO SE LE PERMITEN CUATRO PERSONAS DE APOYO DURANTE LAS HORAS DE VISITA (7 AM -8PM)   La(s) persona(s) de apoyo debe(n) pasar nuestra evaluacin, entrar y salir con gel y usar la mscara en todo momento, incluso en la habitacin del paciente. Los pacientes tambin deben usar una mscara cuando el personal o su persona de apoyo estn en la habitacin. Los visitantes DEBEN LLEVAR ETIQUETA DE VISITANTE DE UNA MANERA VISIBLE. Un visitante adulto puede permanecer con usted durante la noche y DEBE estar en la habitacin a las 8 P.M.     Su procedimiento est programado en: 06/13/24   Presntese a la entrada principal del [facility]     Presntese a admisiones por la maana a las: 9:00 AM   Llame a este nmero si tiene problemas la maana de la ciruga al [phone]   No consuma alimentos: Despus de la medianoche   Despus de la medianoche puede tomar los siguientes lquidos hasta la(s) : 8:00 AM DEL DA DE LA CIRUGA  Agua Caf negro (con azcar, SIN LECHE, NI CREMA)  T normal y descafeinado (con azcar, SIN LECHE, NI CREMA)                              Gelatina normal (NO ROJA)                                           Helados de frutas (sin pulpa. NO DE COLOR ROJO)                                     Helados de hielo (NO ROJO)                                                                  Jugo: de manzana, uva BLANCA, arndano BLANCO Bebidas deportivas como Gatorade (NO ROJAS)  SIGA CUALQUIER INSTRUCCIN ADICIONAL PREOPERATORIA QUE HAYA RECIBIDO DEL OFICINA DE DU CIRUJANO!!!   La higiene bucal tambin es importante para reducir el riesgo de infeccin.                                    Recuerde - LVESE LOS DIENTES EN LA MAANA DE LA CIRUGA CON SU PASTA DENTAL HABITUAL   NO fume despus de la medianoche   [employer] en la maana de la ciruga con UN SORBO DE AGUA: [place].Use las gotas de los ojos [employer].  NO TOME NINGN MEDICAMENTO ORAL PARA LA DIABETES EL DA DE LA CIRUGA  No debe trae ningn metal en el cuerpo, incluyendo pinzas para el cabello, joyas, ni aretes/pendientes             No use maquillaje, lociones/cremas, polvos, perfumes/colonias o desodorante  No use esmalte de uas, incluyendo los de gel ni S&S, uas artificiales/acrlicas o cualquier otro tipo de cobertura en las uas naturales, incluyendo las uas de las manos y [employer]. Si tiene uas artificiales, con capas de gel, etc. que necesite que le quiten en un saln de uas, por favor, pida que se lo quiten antes de la ciruga o la ciruga podra ser cancelada/retrasada si el cirujano o el anestesilogo consideran que no puede ser monitoreado(a) de una forma segura.   No se rasure en las 48 horas antes de la operacin.    No traiga objetos de valor al hospital. [place] NO SE HACE RESPONSABLE DE LOS OBJETOS DE VALOR.   Los contactos, las dentaduras o los puentes no se pueden usar durante la [place].   Melonie una bolsa pequea para la noche el da de la [place].   NO TRAIGA AL HOSPITAL LOS MEDICAMENTOS QUE TOMA EN CASA . LA FARMACIA LE SUMINISTRAR LOS MEDICAMENTOS QUE TENGA EN SU LISTA DE MEDICAMENTOS DURANTE SU ESTADA EN EL HOSPITAL!    Los pacientes dados de alta el mismo da de la ciruga no podrn Company secretary a casa.  Es NECESARIO que alguien se quede con usted durante las primeras 24 horas despus de la anestesia.   Instrucciones especiales: Melonie query copia de sus documentos de poder notarial y testamento vital el da de su ciruga si no los ha escaneado antes.              Por favor, lea las siguientes hojas  informativas que le dieron: SI TIENE PREGUNTAS SOBRE SUS INSTRUCCIONES PREOPERATORIAS POR FAVOR LLAME AL [phone]                        PREPARACIN PARA LA CIRUGA                                            Preparing for Surgery  Debido a que la piel no est esterilizada, sta necesita estar lo ms libre de grmenes como sea posible.  Usted puede reducir el nmero de grmenes en la piel lavndose con el jabn de CHG (Chlorahexidine gluconate) antes de la ciruga.  El CHG es un jabn antisptico el cual mata los grmenes y se une a la piel para continuar matando los grmenes incluso hasta despus de lavarse. POR FAVOR NO LO USE SI USTED TIENE ALERGIAS AL CHG.  SI LA PIEL SE IRRITA, DEJE DE USAR EL CHG.  NO SE RASURE DURANTE AL MENOS 12 HORAS ANTES DE LA PRIMERA DUCHA CON EL CHG. Siga estas instrucciones cuidadosamente:  Dchese la noche anterior a la [place] y de nuevo en la maana de la [place]. Si decide lavarse el cabello, lvelo con su champ normal primero. Enjuague el cabello y el cuerpo para quitarse el [employer]. Use el CHG como lo hara con cualquier otro jabn lquido, usando una toallita o esponja vegetal o exfoliante. Aplique el CHG al cuerpo solamente DEL CUELLO PARA ABAJO.  No lo use cerca de los ojos o los genitales. No se lave con su jabn normal despus de usar el CHG. Squese con una toalla limpia.  Espere hasta la maana siguiente para aplicarse desodorantes, lociones, excepto en el da de la [place], NO SE APLIQUE LOCIONES. Use pijamas limpias o una bata. Coloque sbanas limpias en su cama la noche de su primera ducha - no duerma con mascotas. 10.  Use ropa limpia al venir al hospital.

## 2024-06-07 ENCOUNTER — Encounter (HOSPITAL_COMMUNITY): Payer: Self-pay

## 2024-06-07 ENCOUNTER — Other Ambulatory Visit: Payer: Self-pay

## 2024-06-07 ENCOUNTER — Encounter (HOSPITAL_COMMUNITY)
Admission: RE | Admit: 2024-06-07 | Discharge: 2024-06-07 | Disposition: A | Discharge status: Home / Self Care | Source: Ambulatory Visit | Attending: Urology | Admitting: Urology

## 2024-06-07 DIAGNOSIS — Z01818 Encounter for other preprocedural examination: Secondary | ICD-10-CM | POA: Insufficient documentation

## 2024-06-07 HISTORY — DX: Personal history of urinary calculi: Z87.442

## 2024-06-07 NOTE — Progress Notes (Signed)
 For Anesthesia: PCP - Mason Haywood LABOR, PA-C  Cardiologist - N/A  Bowel Prep reminder:  Chest x-ray - N/A EKG - 05/18/24 Stress Test -  ECHO -  Cardiac Cath -  Pacemaker/ICD device last checked: Pacemaker orders received: Device Rep notified:  Spinal Cord Stimulator:N/A  Sleep Study - N/A CPAP -   Fasting Blood Sugar - N/A Checks Blood Sugar _____ times a day Date and result of last Hgb A1c-  Last dose of GLP1 agonist- N/A GLP1 instructions:   Last dose of SGLT-2 inhibitors- N/A SGLT-2 instructions:   Blood Thinner Instructions:N/A Aspirin Instructions: Last Dose:  Activity level: Can go up a flight of stairs and activities of daily living without stopping and without chest pain and/or shortness of breath   Able to exercise without chest pain and/or shortness of breath  Anesthesia review:   Patient denies shortness of breath, fever, cough and chest pain at PAT appointment   Patient verbalized understanding of instructions that were reviewed over the telephone.

## 2024-06-13 ENCOUNTER — Other Ambulatory Visit: Payer: Self-pay

## 2024-06-13 ENCOUNTER — Ambulatory Visit (HOSPITAL_COMMUNITY): Payer: Self-pay | Admitting: Physician Assistant

## 2024-06-13 ENCOUNTER — Encounter (HOSPITAL_COMMUNITY): Payer: Self-pay | Admitting: Urology

## 2024-06-13 ENCOUNTER — Ambulatory Visit (HOSPITAL_COMMUNITY): Admitting: Anesthesiology

## 2024-06-13 ENCOUNTER — Ambulatory Visit (HOSPITAL_COMMUNITY)

## 2024-06-13 ENCOUNTER — Ambulatory Visit (HOSPITAL_COMMUNITY)
Admission: RE | Admit: 2024-06-13 | Discharge: 2024-06-13 | Disposition: A | Discharge status: Home / Self Care | Attending: Urology | Admitting: Urology

## 2024-06-13 ENCOUNTER — Encounter (HOSPITAL_COMMUNITY)
Admission: RE | Disposition: A | Discharge status: Home / Self Care | Payer: Self-pay | Source: Home / Self Care | Attending: Urology

## 2024-06-13 DIAGNOSIS — N200 Calculus of kidney: Secondary | ICD-10-CM

## 2024-06-13 DIAGNOSIS — Z87440 Personal history of urinary (tract) infections: Secondary | ICD-10-CM | POA: Diagnosis not present

## 2024-06-13 DIAGNOSIS — N202 Calculus of kidney with calculus of ureter: Secondary | ICD-10-CM | POA: Insufficient documentation

## 2024-06-13 DIAGNOSIS — I10 Essential (primary) hypertension: Secondary | ICD-10-CM | POA: Insufficient documentation

## 2024-06-13 HISTORY — PX: CYSTOSCOPY/URETEROSCOPY/HOLMIUM LASER/STENT PLACEMENT: SHX6546

## 2024-06-13 SURGERY — CYSTOSCOPY/URETEROSCOPY/HOLMIUM LASER/STENT PLACEMENT
Anesthesia: General | Site: Bladder | Laterality: Left

## 2024-06-13 MED ORDER — PROPOFOL 10 MG/ML IV BOLUS
INTRAVENOUS | Status: DC | PRN
  Administered 2024-06-13: 200 mg via INTRAVENOUS

## 2024-06-13 MED ORDER — MIDAZOLAM HCL 2 MG/2ML IJ SOLN
INTRAMUSCULAR | Status: AC
  Filled 2024-06-13: qty 2

## 2024-06-13 MED ORDER — MIDAZOLAM HCL 2 MG/2ML IJ SOLN
INTRAMUSCULAR | Status: DC | PRN
  Administered 2024-06-13: 2 mg via INTRAVENOUS

## 2024-06-13 MED ORDER — FENTANYL CITRATE (PF) 100 MCG/2ML IJ SOLN
INTRAMUSCULAR | Status: DC | PRN
  Administered 2024-06-13 (×2): 25 ug via INTRAVENOUS

## 2024-06-13 MED ORDER — CHLORHEXIDINE GLUCONATE 0.12 % MT SOLN
15.0000 mL | Freq: Once | OROMUCOSAL | Status: AC
  Administered 2024-06-13: 15 mL via OROMUCOSAL

## 2024-06-13 MED ORDER — DEXAMETHASONE SODIUM PHOSPHATE 10 MG/ML IJ SOLN
INTRAMUSCULAR | Status: AC
Start: 2024-06-13 — End: 2024-06-13
  Filled 2024-06-13: qty 1

## 2024-06-13 MED ORDER — ONDANSETRON HCL 4 MG/2ML IJ SOLN
INTRAMUSCULAR | Status: DC | PRN
  Administered 2024-06-13: 4 mg via INTRAVENOUS

## 2024-06-13 MED ORDER — SODIUM CHLORIDE 0.9 % IV SOLN
1.0000 g | INTRAVENOUS | Status: AC
  Administered 2024-06-13: 2 g via INTRAVENOUS
  Filled 2024-06-13: qty 10

## 2024-06-13 MED ORDER — FENTANYL CITRATE (PF) 100 MCG/2ML IJ SOLN
INTRAMUSCULAR | Status: AC
Start: 2024-06-13 — End: 2024-06-13
  Filled 2024-06-13: qty 2

## 2024-06-13 MED ORDER — SODIUM CHLORIDE 0.9 % IR SOLN
Status: DC | PRN
  Administered 2024-06-13: 3000 mL

## 2024-06-13 MED ORDER — OXYBUTYNIN CHLORIDE 5 MG PO TABS: 5.0000 mg | ORAL_TABLET | Freq: Three times a day (TID) | ORAL | 1 refills | Status: AC | PRN

## 2024-06-13 MED ORDER — LIDOCAINE HCL (PF) 2 % IJ SOLN
INTRAMUSCULAR | Status: AC
  Filled 2024-06-13: qty 5

## 2024-06-13 MED ORDER — OXYCODONE HCL 5 MG PO TABS: 5.0000 mg | ORAL_TABLET | Freq: Once | ORAL | Status: DC | PRN

## 2024-06-13 MED ORDER — DEXAMETHASONE SODIUM PHOSPHATE 10 MG/ML IJ SOLN
INTRAMUSCULAR | Status: DC | PRN
  Administered 2024-06-13: 8 mg via INTRAVENOUS

## 2024-06-13 MED ORDER — AMISULPRIDE (ANTIEMETIC) 5 MG/2ML IV SOLN: 10.0000 mg | Freq: Once | INTRAVENOUS | Status: DC | PRN

## 2024-06-13 MED ORDER — CEPHALEXIN 500 MG PO CAPS: 500.0000 mg | ORAL_CAPSULE | Freq: Two times a day (BID) | ORAL | 0 refills | Status: AC

## 2024-06-13 MED ORDER — ACETAMINOPHEN 10 MG/ML IV SOLN: 1000.0000 mg | Freq: Once | INTRAVENOUS | Status: DC | PRN

## 2024-06-13 MED ORDER — KETOROLAC TROMETHAMINE 30 MG/ML IJ SOLN: 30.0000 mg | Freq: Once | INTRAMUSCULAR | Status: DC | PRN

## 2024-06-13 MED ORDER — PROPOFOL 10 MG/ML IV BOLUS
INTRAVENOUS | Status: AC
  Filled 2024-06-13: qty 20

## 2024-06-13 MED ORDER — FENTANYL CITRATE PF 50 MCG/ML IJ SOSY: 25.0000 ug | PREFILLED_SYRINGE | INTRAMUSCULAR | Status: DC | PRN

## 2024-06-13 MED ORDER — PROPOFOL 500 MG/50ML IV EMUL
INTRAVENOUS | Status: AC
  Filled 2024-06-13: qty 50

## 2024-06-13 MED ORDER — PHENAZOPYRIDINE HCL 200 MG PO TABS: 200.0000 mg | ORAL_TABLET | Freq: Three times a day (TID) | ORAL | 0 refills | Status: AC | PRN

## 2024-06-13 MED ORDER — LIDOCAINE HCL (CARDIAC) PF 100 MG/5ML IV SOSY
PREFILLED_SYRINGE | INTRAVENOUS | Status: DC | PRN
  Administered 2024-06-13: 60 mg via INTRAVENOUS

## 2024-06-13 MED ORDER — ORAL CARE MOUTH RINSE: 15.0000 mL | Freq: Once | OROMUCOSAL | Status: AC

## 2024-06-13 MED ORDER — ONDANSETRON HCL 4 MG/2ML IJ SOLN
INTRAMUSCULAR | Status: AC
  Filled 2024-06-13: qty 2

## 2024-06-13 MED ORDER — LACTATED RINGERS IV SOLN: INTRAVENOUS | Status: DC

## 2024-06-13 MED ORDER — OXYCODONE HCL 5 MG/5ML PO SOLN: 5.0000 mg | Freq: Once | ORAL | Status: DC | PRN

## 2024-06-13 SURGICAL SUPPLY — 19 items
BAG URO CATCHER STRL LF (MISCELLANEOUS) ×1 IMPLANT
BASKET ZERO TIP NITINOL 2.4FR (BASKET) IMPLANT
CATH URETL OPEN 5X70 (CATHETERS) ×1 IMPLANT
CLOTH BEACON ORANGE TIMEOUT ST (SAFETY) ×1 IMPLANT
EXTRACTOR STONE NITINOL NGAGE (UROLOGICAL SUPPLIES) IMPLANT
FIBER LASER MOSES 200 DFL (Laser) IMPLANT
GLOVE SURG LX STRL 8.0 MICRO (GLOVE) ×1 IMPLANT
GOWN STRL SURGICAL XL XLNG (GOWN DISPOSABLE) ×1 IMPLANT
GUIDEWIRE STR DUAL SENSOR (WIRE) IMPLANT
GUIDEWIRE ZIPWRE .038 STRAIGHT (WIRE) ×1 IMPLANT
KIT TURNOVER KIT A (KITS) ×1 IMPLANT
MANIFOLD NEPTUNE II (INSTRUMENTS) ×1 IMPLANT
PACK CYSTO (CUSTOM PROCEDURE TRAY) ×1 IMPLANT
SHEATH NAVIGATOR HD 11/13X28 (SHEATH) IMPLANT
SHEATH NAVIGATOR HD 11/13X36 (SHEATH) IMPLANT
STENT URET 6FRX24 CONTOUR (STENTS) IMPLANT
STENT URET 6FRX26 CONTOUR (STENTS) IMPLANT
TUBING CONNECTING 10 (TUBING) ×1 IMPLANT
TUBING UROLOGY SET (TUBING) ×1 IMPLANT

## 2024-06-13 NOTE — Anesthesia Preprocedure Evaluation (Signed)
 Anesthesia Evaluation  Patient identified by MRN, date of birth, ID band Patient awake    Reviewed: Allergy & Precautions, NPO status , Patient's Chart, lab work & pertinent test results  Airway Mallampati: III  TM Distance: >3 FB Neck ROM: Full    Dental no notable dental hx.    Pulmonary neg pulmonary ROS   Pulmonary exam normal        Cardiovascular hypertension, Pt. on medications Normal cardiovascular exam     Neuro/Psych  Headaches PSYCHIATRIC DISORDERS Anxiety Depression     Neuromuscular disease    GI/Hepatic negative GI ROS, Neg liver ROS,,,  Endo/Other  negative endocrine ROS    Renal/GU Renal disease     Musculoskeletal negative musculoskeletal ROS (+)    Abdominal   Peds  Hematology negative hematology ROS (+)   Anesthesia Other Findings   Reproductive/Obstetrics                              Anesthesia Physical Anesthesia Plan  ASA: 2  Anesthesia Plan: General   Post-op Pain Management:    Induction: Intravenous  PONV Risk Score and Plan: 3 and Ondansetron , Dexamethasone , Midazolam  and Treatment may vary due to age or medical condition  Airway Management Planned: LMA  Additional Equipment:   Intra-op Plan:   Post-operative Plan: Extubation in OR  Informed Consent: I have reviewed the patients History and Physical, chart, labs and discussed the procedure including the risks, benefits and alternatives for the proposed anesthesia with the patient or authorized representative who has indicated his/her understanding and acceptance.     Dental advisory given  Plan Discussed with: CRNA  Anesthesia Plan Comments:         Anesthesia Quick Evaluation

## 2024-06-13 NOTE — Anesthesia Procedure Notes (Signed)
 Procedure Name: LMA Insertion Date/Time: 06/13/2024 12:27 PM  Performed by: Therisa Doyal CROME, CRNAPatient Re-evaluated:Patient Re-evaluated prior to induction Oxygen Delivery Method: Circle system utilized Preoxygenation: Pre-oxygenation with 100% oxygen Induction Type: IV induction LMA Size: 4.0 Number of attempts: 1 Placement Confirmation: positive ETCO2 and breath sounds checked- equal and bilateral Tube secured with: Tape Dental Injury: Teeth and Oropharynx as per pre-operative assessment

## 2024-06-13 NOTE — Transfer of Care (Signed)
 Immediate Anesthesia Transfer of Care Note  Patient: [name]  Procedure(s) Performed: CYSTOSCOPY/URETEROSCOPY/HOLMIUM LASER/STENT PLACEMENT (Left: Bladder)  Patient Location: PACU  Anesthesia Type:General  Level of Consciousness: drowsy  Airway & Oxygen Therapy: Patient Spontanous Breathing and Patient connected to face mask oxygen  Post-op Assessment: Report given to RN and Post -op Vital signs reviewed and stable  Post vital signs: Reviewed and stable  Last Vitals:  Vitals Value Taken Time  BP 103/65 06/13/24 13:09  Temp    Pulse 83 06/13/24 13:11  Resp 14 06/13/24 13:11  SpO2 100 % 06/13/24 13:11  Vitals shown include unfiled device data.  Last Pain:  Vitals:   06/13/24 0908  TempSrc: Oral  PainSc:          Complications: No notable events documented.

## 2024-06-13 NOTE — H&P (Signed)
 Office Visit Report     05/31/2024   --------------------------------------------------------------------------------   [name]  MRN: [id number]  DOB: 1970/10/26, 53 year old Female    PRIMARY CARE:     REFERRING:    PROVIDER:  Lonni Han, M.D.  TREATING:  [name], M.D.  LOCATION:  [facility] [phone]     --------------------------------------------------------------------------------   CC/HPI: Patient of Dr. Maralyn added on today for management of left proximal ureteral calculus status post stent due to UTI on 05/26/2024. Stone was noted on CT with HU 987. Urine culture grew pansensitive E. coli. White count was 33 and that settled down to 13. Creatinine 0.59. She was discharged on amoxicillin  500 mg p.o. twice daily through June 08, 2024.   KUB today with left stent in good position and the stone was pushed back up into the left upper pole. 5 mm.   Doing well. No fever.   She is here with interpreter and her sister.     ALLERGIES: None   MEDICATIONS: hydroCHLOROthiazide  1 PO Daily  Lisinopril  1 PO Daily  Amoxicillin  1 PO Daily  Eye Drops 1 PO Daily     GU PSH: Placement of ureteral stent, percutaneous, Left     NON-GU PSH: None   GU PMH: None   NON-GU PMH: GERD Hypertension    FAMILY HISTORY: Kidney Failure - Runs in Family UTI symptoms - Runs in Family   SOCIAL HISTORY: No Social History    REVIEW OF SYSTEMS:    GU Review Female:   Patient denies frequent urination, hard to postpone urination, burning /pain with urination, get up at night to urinate, leakage of urine, stream starts and stops, trouble starting your stream, have to strain to urinate, and being pregnant.  Gastrointestinal (Upper):   Patient denies nausea, vomiting, and indigestion/ heartburn.  Gastrointestinal (Lower):   Patient denies diarrhea and constipation.  Constitutional:   Patient reports weight loss and fatigue. Patient denies fever  and night sweats.  Skin:   Patient denies skin rash/ lesion and itching.  Eyes:   Patient denies blurred vision and double vision.  Ears/ Nose/ Throat:   Patient denies sore throat and sinus problems.  Hematologic/Lymphatic:   Patient denies easy bruising and swollen glands.  Cardiovascular:   Patient denies leg swelling and chest pains.  Respiratory:   Patient denies cough and shortness of breath.  Endocrine:   Patient denies excessive thirst.  Musculoskeletal:   Patient denies back pain and joint pain.  Neurological:   Patient denies headaches and dizziness.  Psychologic:   Patient denies depression and anxiety.   VITAL SIGNS: None   MULTI-SYSTEM PHYSICAL EXAMINATION:    Constitutional: Well-nourished. No physical deformities. Normally developed. Good grooming.  Neck: Neck symmetrical, not swollen. Normal tracheal position.  Respiratory: No labored breathing, no use of accessory muscles.   Cardiovascular: Normal temperature, normal extremity pulses, no swelling, no varicosities.  Skin: No paleness, no jaundice, no cyanosis. No lesion, no ulcer, no rash.  Neurologic / Psychiatric: Oriented to time, oriented to place, oriented to person. No depression, no anxiety, no agitation.  Gastrointestinal: No mass, no tenderness, no rigidity, non obese abdomen.  Eyes: Normal conjunctivae. Normal eyelids.  Ears, Nose, Mouth, and Throat: Left ear no scars, no lesions, no masses. Right ear no scars, no lesions, no masses. Nose no scars, no lesions, no masses. Normal hearing. Normal lips.  Musculoskeletal: Normal gait and station of head and neck.     Complexity of Data:  Lab Test Review:   BUN/Creatinine, CBC with Diff  Records Review:   Previous Hospital Records  X-Ray Review: C.T. Abdomen/Pelvis: Reviewed Films. Discussed With Patient. 2025    PROCEDURES:         KUB - 74018  A single view of the abdomen is obtained.      The bones appeared normal. The bowel gas pattern appeared normal.  The soft tissues were unremarkable. . Patient confirmed No Neulasta OnPro Device.           Visit Complexity - G2211 Chronic management         Urinalysis w/Scope Dipstick Dipstick Cont'd Micro  Color: Yellow Bilirubin: Neg mg/dL WBC/hpf: NS (Not Seen)  Appearance: Slightly Cloudy Ketones: Neg mg/dL RBC/hpf: 3 - 89/yeq  Specific Gravity: 1.015 Blood: Trace ery/uL Bacteria: Few (10-25/hpf)  pH: 6.5 Protein: Trace mg/dL Cystals: NS (Not Seen)  Glucose: Neg mg/dL Urobilinogen: 0.2 mg/dL Casts: NS (Not Seen)    Nitrites: Neg Trichomonas: Not Present    Leukocyte Esterase: Neg leu/uL Mucous: Not Present      Epithelial Cells: 6 - 10/hpf      Yeast: NS (Not Seen)      Sperm: Not Present    ASSESSMENT:      ICD-10 Details  1 GU:   Ureteral calculus - N20.1 Chronic, Stable - discussed CT and KUB findings and nature r/b/a to left ESWL or URS/HLL/stent. She said she was already counseled on URS and stent with a tampon (stent string). Sch sheet tuned in. Discussed importance of f/u. Discussed diet changes to prevent.   2 NON-GU:   Bacteriuria - R82.71 Undiagnosed New Problem   PLAN:           Orders Labs Urine Culture  X-Rays: KUB          Schedule Return Visit/Planned Activity: Next Available Appointment - Schedule Surgery          Document The risks, benefits and alternatives of cystoscopy with LEFT ureteroscopy, laser lithotripsy and ureteral stent placement was discussed the patient.  Risks included, but are not limited to: bleeding, urinary tract infection, ureteral injury/avulsion, ureteral stricture formation, retained stone fragments, the possibility that multiple surgeries may be required to treat the stone(s), MI, stroke, PE and the inherent risks of general anesthesia.  The patient voices understanding and wishes to proceed.

## 2024-06-13 NOTE — Op Note (Signed)
 Operative Note  Preoperative diagnosis:  1.  6 mm left UPJ stone  Postoperative diagnosis: 1.  6 mm left renal stone  Procedure(s): 1.  Cystoscopy with left ureteroscopy, holmium laser lithotripsy and left JJ stent exchange   Surgeon: Lonni Han, MD  Assistants:  None  Anesthesia:  General  Complications:  None  EBL: Less than 5 mL  Specimens: 1.  Previously placed left ureteral stent was removed intact, inspected and discarded  Drains/Catheters: 1.  Left 6 French, 24 cm JJ stent with tether  Intraoperative findings:   No intravesical or left ureteral abnormalities were seen 6 mm left renal stone  Indication:  [name] is a 53 y.o. female with a history of a 6 mm left UPJ stone with concurrent urosepsis secondary to an E. coli UTI.  She required urgent ureteral stent placement on 05/26/2024.  She is here today for definitive stone management.  She has been consented for the above procedures, voices understanding and wishes to proceed.  Description of procedure:  After informed consent was obtained, the patient was brought to the operating room and general LMA anesthesia was administered. The patient was then placed in the dorsolithotomy position and prepped and draped in the usual sterile fashion. A timeout was performed. A 21 French rigid cystoscope was then inserted into the urethral meatus and advanced into the bladder under direct vision. A complete bladder survey revealed no intravesical pathology.  Her previously placed left ureteral stent was then grasped at its distal curl and retracted to the urethral meatus.  A Glidewire was then advanced through the lumen of the stent up to the left renal pelvis, and fluoroscopic guidance.  The previously placed stent was then removed, inspected and discarded.  A flexible ureteroscope was then advanced up the left ureter and into the renal pelvis where her 6 mm stone was identified.  A 200 m holmium  laser was then used to fracture the stone into less than 1 mm fragments.  The flexible ureteroscope was then removed, identifying no evidence of ureteral trauma or luminal stone burden.  A new 6 [place], 24 cm JJ stent was then advanced over the Glidewire and into good position within the left collecting system, confirming placement via fluoroscopy.  The tether stent was left intact.  The patient's bladder was drained.  She tolerated the procedure well was transferred to the postanesthesia in stable condition.  Plan: Discharge home.  The patient has been instructed to remove her ureteral stent at 7 AM on 06/15/2024

## 2024-06-13 NOTE — Progress Notes (Signed)
 [facility] - Clinic Note  06/15/2024     CHIEF COMPLAINT Patient presents for Retina Follow Up   HISTORY OF PRESENT ILLNESS: [name] is a 53 y.o. female who presents to the clinic today for:  HPI     Retina Follow Up   Diagnosis: laser retinopexy.  In left eye.  This started 3 weeks ago.  Severity is severe.  Duration of 3 weeks.  Since onset it is stable.  I, the attending physician,  performed the HPI with the patient and updated documentation appropriately.        Comments   Patient states no changes in vision OU. Pt denies FOL/floaters. Pt denies pain and has not been needing tylenol . Pt states she has been consistent with drops OD: PF TID OD, ketorolac  TID OD, PSO ung bedtime / PRN OD (but seemed to just say yes to everything.)       Last edited by Valdemar Rogue, MD on 06/20/2024  1:51 AM.     Patient states she just had an emergency kidney surgery for an infected kidney stone, the day after she had laser in OS. She had to r/s the 1 wk f/u due to the surgery. No hx of kidney issues.   Referring physician: Mason Haywood LABOR, PA-C [address] [place],  [place] [address]  HISTORICAL INFORMATION:   Selected notes from the MEDICAL RECORD NUMBER Referred by Dr. Devere Kitty for concern of sub-macular fluid / retinoschisis   CURRENT MEDICATIONS: Current Outpatient Medications (Ophthalmic Drugs)  Medication Sig   bacitracin -polymyxin b  (POLYSPORIN ) ophthalmic ointment Place into the right eye 4 (four) times daily. (Patient taking differently: Place 1 Application into the right eye at bedtime.)   ketorolac  (ACULAR ) 0.5 % ophthalmic solution Place 1 drop into the left eye 4 (four) times daily. (Patient taking differently: Place 1 drop into the right eye 4 (four) times daily.)   prednisoLONE  acetate (PRED FORTE ) 1 % ophthalmic suspension Place 1 drop into the left eye 4 (four) times daily. (Patient taking differently: Place 1  drop into both eyes 4 (four) times daily.)   No current facility-administered medications for this visit. (Ophthalmic Drugs)   Current Outpatient Medications (Other)  Medication Sig   hydrochlorothiazide  (HYDRODIURIL ) 25 MG tablet Take 25 mg by mouth daily.   HYDROcodone -acetaminophen  (NORCO/VICODIN) 5-325 MG tablet Take 1-2 tablets by mouth every 6 (six) hours as needed for moderate pain (pain score 4-6) or severe pain (pain score 7-10).   lisinopril  (ZESTRIL ) 20 MG tablet Take 20 mg by mouth daily.   oxybutynin  (DITROPAN ) 5 MG tablet Take 1 tablet (5 mg total) by mouth every 8 (eight) hours as needed for bladder spasms.   phenazopyridine  (PYRIDIUM ) 200 MG tablet Take 1 tablet (200 mg total) by mouth 3 (three) times daily as needed (for pain with urination).   No current facility-administered medications for this visit. (Other)   REVIEW OF SYSTEMS: ROS   Positive for: Cardiovascular, Eyes Negative for: Constitutional, Gastrointestinal, Neurological, Skin, Genitourinary, Musculoskeletal, HENT, Endocrine, Respiratory, Psychiatric, Allergic/Imm, Heme/Lymph Last edited by Elnor Avelina RAMAN, COT on 06/15/2024  9:16 AM.     ALLERGIES Allergies  Allergen Reactions   Pollen Extract Itching    POLLEN EXTRACTS    PAST MEDICAL HISTORY Past Medical History:  Diagnosis Date   Abnormal colonoscopy 10/30/2019   Impression: - One 12 mm polyp in the transverse colon, removed with a cold snare. Resected and retrieved. One 2 mm polyp  in the sigmoid colon, removed with a cold snare. Resected and retrieved. Diverticulosis in the ascending colon. Non-bleeding internal hemorrhoids. Return to GI PRN. Repeat in 3 years. May be amenable to hermorrhoid band ligation.     Benign neoplasm of ascending colon 01/28/2023   12/14/2022     Benign neoplasm of transverse colon 10/30/2019   Cervical dysplasia    CIN I (cervical intraepithelial neoplasia I)    Grade II hemorrhoids 01/28/2023   12/14/2022     High  risk HPV infection    History of abnormal cervical Pap smear    12-28-21 lgsil HPV HR +   History of adenomatous polyp of colon    History of infection due to human papilloma virus (HPV) 11/20/2019   Present again 06/19/2020 Not present 03/11/2023     History of kidney stones    Hypertension    Hypertensive retinopathy    OU   Macular degeneration, right eye    Missed abortion 05/04/2023   05/16/2013     Polyp of sigmoid colon 10/30/2019   Polyp of transverse colon 10/30/2019   Retinal defect, 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   followed by dr Sadiya Durand;  pt takes  inspra  for excess fluid in retina,   Sciatic nerve injury    Seasonal allergies    Vaginal dysplasia    Wears glasses    Past Surgical History:  Procedure Laterality Date   CERVICAL CONIZATION W/BX N/A 06/30/2023   Procedure: CONIZATION CERVIX WITH BIOPSY, ENDOCERVICAL CURETTAGE;  Surgeon: Viktoria Comer SAUNDERS, MD;  Location: [facility];  Service: Gynecology;  Laterality: N/A;   CO2 LASER APPLICATION N/A 06/30/2023   Procedure: CO2 LASER TO THE VAGINA;  Surgeon: Viktoria Comer SAUNDERS, MD;  Location: [facility];  Service: Gynecology;  Laterality: N/A;   COLONOSCOPY  12/14/2022   CYSTOSCOPY W/ URETERAL STENT PLACEMENT Left 05/26/2024   Procedure: CYSTOSCOPY, WITH RETROGRADE PYELOGRAM AND URETERAL STENT INSERTION;  Surgeon: Devere Lonni Righter, MD;  Location[facility];  Service: Urology;  Laterality: Left;   CYSTOSCOPY/URETEROSCOPY/HOLMIUM LASER/STENT PLACEMENT Left 06/13/2024   Procedure: CYSTOSCOPY/URETEROSCOPY/HOLMIUM LASER/STENT PLACEMENT;  Surgeon: Devere Lonni Righter, MD;  Location: [facility];  Service: Urology;  Laterality: Left;  CYSTOSCOPY/LEFT URETEROSCOPY/HOLMIUM LASER/STENT PLACEMENT   HYSTEROSCOPY WITH RESECTOSCOPE  11/25/2006   @[facility]  by dr jinny. winfred;   Resectoscopic myomectomy and polypectomy   INJECTION OF SILICONE OIL Right 09/15/2023   Procedure: INJECTION OF SILICONE OIL;  Surgeon: Valdemar Rogue, MD;  Location: [facility] OR;  Service: Ophthalmology;  Laterality: Right;   INJECTION OF SILICONE OIL Right 02/23/2024   Procedure: INJECTION, SILICONE OIL EXCHANGE;  Surgeon: Valdemar Rogue, MD;  Location: [facility] OR;  Service: Ophthalmology;  Laterality: Right;   PARS PLANA VITRECTOMY Right 09/15/2023   Procedure: PARS PLANA VITRECTOMY WITH 25 GAUGE;  Surgeon: Valdemar Rogue, MD;  Location: [facility] OR;  Service: Ophthalmology;  Laterality: Right;   PARS PLANA VITRECTOMY W/ FOCAL ENDOLASER PHOTOCOAGULATION Right 02/23/2024   Procedure: 25 GAUGE PARS PLANA VITRECTOMY  WITH LENSECTOMY, MEMBRANE PEEL, AND ENDOLASER;  Surgeon: Valdemar Rogue, MD;  Location: [facility] OR;  Service: Ophthalmology;  Laterality: Right;   REMOVAL RETAINED LENS Right 02/23/2024   Procedure: REMOVAL, RETAINED LENS MATTER;  Surgeon: Valdemar Rogue, MD;  Location: [facility] OR;  Service: Ophthalmology;  Laterality: Right;   REPAIR OF COMPLEX TRACTION RETINAL DETACHMENT Right 02/23/2024   Procedure: REPAIR, RETINAL DETACHMENT, COMPLEX;  Surgeon: Valdemar Rogue, MD;  Location: [facility] OR;  Service: Ophthalmology;  Laterality: Right;   VAGINECTOMY, PARTIAL  N/A 06/30/2023   Procedure: SMALL VAGINECTOMY, PARTIAL BIOSPIES;  Surgeon: Viktoria Comer SAUNDERS, MD;  Location: [facility];  Service: Gynecology;  Laterality: N/A;   FAMILY HISTORY Family History  Problem Relation Age of Onset   Hypertension Mother    Diabetes Father    Diabetes Maternal Grandmother    Hypertension Maternal Grandmother    Colon cancer Neg Hx    Colon polyps Neg Hx    Esophageal cancer Neg Hx    Rectal cancer Neg Hx    Stomach cancer Neg Hx    SOCIAL HISTORY Social History   Tobacco Use   Smoking status: Never   Smokeless tobacco: Never  Vaping Use   Vaping status: Never Used  Substance Use Topics   Alcohol use: No    Alcohol/week: 0.0 standard drinks of alcohol   Drug use: Never       OPHTHALMIC EXAM: Base Eye Exam     Visual Acuity (Snellen - Linear)        Right Left   Dist [place] [employer] 20/25   Dist ph [place] NI NI    Correction: Glasses         Tonometry (Tonopen, 9:19 AM)       Right Left   Pressure 10 11         Pupils       Dark Light Shape React APD   Right 5 5 Round NR None   Left 4 3 Round Brisk NR         Visual Fields       Left Right    Full    Restrictions  Total superior nasal, inferior nasal deficiencies         Extraocular Movement       Right Left    Full, Ortho Full, Ortho         Neuro/Psych     Oriented x3: Yes   Mood/Affect: Normal         Dilation     Both eyes: 1.0% Mydriacyl , 2.5% Phenylephrine  @ 9:20 AM           Slit Lamp and Fundus Exam     Slit Lamp Exam       Right Left   Lids/Lashes Dermatochalasis Dermatochalasis - upper lid   Conjunctiva/Sclera White and quiet temporally, nasal pinguecula 1+ Injection, nasal Pinguecula   Cornea Nasal ptergyium extending 2.29mm onto nasal cornea -- less inflamed, peripheral haze, well healed cataract wound, trace Punctate epithelial erosions, 1+ Guttata 1+Punctate epithelial erosions   Anterior Chamber Deep, silicone oil bubbles deep, narrow temporal angle, 1+pigment   Iris Irregular dilation, +PS temporally Round and dilated   Lens 3 piece sulcus IOL with mild nasal displacement and open PC 2+ Nuclear sclerosis, 2+ Cortical cataract   Anterior Vitreous post vitrectomy, good silicone oil fill, lens remnant gone Mild Vitreous syneresis         Fundus Exam       Right Left   Disc 3+pallor, Sharp rim Pink and Sharp   C/D Ratio 0.4 0.6   Macula flat under oil Flat, Good foveal reflex, No heme or edema   Vessels Attenuated, mild tortuosity; fibrosis improved Attenuated, Tortuous   Periphery 360 laser, flat under oil, relaxing retinotomy temporally, dense fibrosis temporal periphery improved Bullous schisis cavity IT periphery (0300-0430)--good laser changes surrounding, new outer retinal holes w/ +SRF extending posterior to laser--focal  RD--good early laser changes surrounding; smaller schisis cavity ST periphery from 0100-0200 --  good early laser changes surrounding, no new RT/RD           Refraction     Wearing Rx       Sphere Cylinder Axis   Right +1.25 +1.25 112   Left +2.25 +1.00 101           IMAGING AND PROCEDURES  Imaging and Procedures for @TODAY @  OCT, Retina - OU - Both Eyes       Right Eye Quality was borderline. Findings include no IRF, no SRF, abnormal foveal contour, subretinal hyper-reflective material, epiretinal membrane, pigment epithelial detachment, outer retinal atrophy (No images today).   Left Eye Quality was good. Central Foveal Thickness: 299. Progression has been stable. Findings include normal foveal contour, no IRF, no SRF, vitreomacular adhesion (bullous schisis cavity IT periphery w/ SRF caught on widefield-not imaged today).   Notes *Images captured and stored on drive  Diagnosis / Impression:  OD: No images today OS: bullous schisis cavity IT periphery w/ SRF caught on widefield--not imaged today  Clinical management:  See below  Abbreviations: NFP - Normal foveal profile. CME - cystoid macular edema. PED - pigment epithelial detachment. IRF - intraretinal fluid. SRF - subretinal fluid. EZ - ellipsoid zone. ERM - epiretinal membrane. ORA - outer retinal atrophy. ORT - outer retinal tubulation. SRHM - subretinal hyper-reflective material            ASSESSMENT/PLAN:    ICD-10-CM   1. Ocular inflammation  H57.89 OCT, Retina - OU - Both Eyes    2. 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 Traction detachment of right retina  H33.41     4. 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 Exudative age-related macular degeneration of right eye with active choroidal neovascularization (HCC)  H35.3211     6. 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 Left retinal detachment  H33.22     8. Retinal hole  of left eye  H33.322     9. Essential hypertension  I10     10. Hypertensive retinopathy of both eyes  H35.033 OCT, Retina - OU - Both Eyes    11. Combined forms of age-related cataract of both eyes  H25.813     12. Pseudophakia  Z96.1      1-3. Ocular inflammation and PVR retinal detachment OD - 10.16.24 pt started seeing new floaters and sparks in vision - pt developed progressively worsening vision and vit haze - VA OD improved from LP to CF - pt was on PredForte q1h OD, Prolensa  QID OD, and 40 mg po Prednisone  daily - b-scan 12.04.24 shows significant mobile vit opacities, ?RD - s/p PPV/PFO/EL/FAX/SO OD, 12.05.2024  - undiluted vit samples obtained intra-op for gram stain and culture -- no organisms, but +WBCs - intra-op - clearance of vitreous haze showed extensive fibrosis and PVR tractional detachment -- silicon oil added at end of case - AC was shallow with angle closed -- but now deep s/p CEIOL - exam showed residual intraretinal fibrosis and persistent SRF - POM4 s/p PPV/lensectomy/membrane peel/relaxing retinotomy/silicon oil exchange OD, 05.15.2025             - doing well - lens material gone; good oil fill             - IOP okay at 08             - cont  PF QID OD- decrease to TID                         ketorolac  QID OD- decrease to TID                         PSO ung bedtime / PRN OD             - post op drop and positioning instructions reviewed              - tylenol /ibuprofen  for pain   - returned to work on Monday, March 19, 2024 -- no straining, no lifting more than 10-15lbs, no pushing or pulling - f/u 4-6 weeks  4,5. CSCR OD -- chronic  - pt reports decreased vision OD since December 2019  - reports significant stressors in life -- work  - denies steroid use - FA (09.30.20) shows focal hyperfluorescent leakage points nasal macula OD -- expansile dot phenotype? - started 50 mg po eplerenone  since 09.30.20  -- pt self d/c in February, re-started in June  2021 - s/p IVA OD #1 (09.01.21), #2 (09.29.21), #3 (11.03.21), #4 (12.03.21) -- IVA resistance - s/p IVE OD #1 (01.20.22), #2 (02.18.22), #3 (04.05.22-sample), #4 (05.23.22), # 5 (06.27.22), #6 (07.28.22), #7 (08.26.22), #8 (09.23.22), #9 (10.21.22), #10 (11.18.22), #11 (12.16.22), #12 (01.20.23), #13 (03.02.23), #14 (04.07.23), #15 (05.12.23), #16 (06.09.23), #17 (07.14.23), #18 (08.08.23), #19 (09.08.23), #20 (10.06.23), #21 (11.06.23), #22 (12.04.23), #23 (01.08.24), #24 (02.12.24), #25 (03.18.24), #26 (04.22.24), #27 (05.20.24) -- IVE resistance ======================================================== - s/p IVV OD #1 (07.02.24), #2 (07.30.24), #3 (08.27.24), #4 (09.26.24), #5 (10.24.24)  - differential includes atypical exudative ARMD - IVV informed consent obtained and signed, 07.02.24  - stop po eplerenone  50 mg daily for now  - monitor  6-8. Retinoschisis OU (OD > OS)  - bullous retinoschisis cavity IT quadrant OD-- 9299-9169 oclock  - bullous retinoschisis cavity IT quadrant OS -- 9669-9569 oclock - widefield Optos imaging (04.22.24) shows mild interval progression of schisis cavities' size and posterior extension in comparison to baseline images -- widefield Optos imaging (08.13.25) OS showed Bullous schisis cavity IT periphery, good laser changes w/ new Outer retinal holes w/ +SRF extending posterior to laser--focal RD; schisis cavity ST periphery  - s/p laser retinopexy OS 08.12.24 -- good laser changes surrounding - s/p laser retinopexy OD (10.07.24) - new schisis cavity found on examination OD (10.28.24) - 1030 periphery -- no RT/RD on scleral depressed exam - s/p laser retinopexy OD to new schisis at 1030 on 10.28.24 - schisis cavity found on exam and imaging OS 08.14.25 -- ST periphery - also, new outer retinal holes w/ +SRF along posterior edge of IT schisis cavity OS - s/p laser retinopexy OS  08.14.25  for focal RD/schisis OS - No new RT/RD - f/u 3-4 wks - DFE/OCT  9,10.  Hypertensive retinopathy OU  - discussed importance of tight BP control  - monitor  11. Mixed Cataract OS - The symptoms of cataract, surgical options, and treatments and risks were discussed with patient. - discussed diagnosis and progression - not yet visually significant - monitor   12,13. Pseudophakia OD  - s/p CE/IOL OD (Dr. Cleopatra, 04.04.25)  - complicated PC rupture and retained lens material in vitreous  - s/p PPV/ lensectomy -- see above  - 3-piece IOL with mild nasal displacement but appears stably scarred in place  - monitor  Ophthalmic Meds Ordered this visit:  No orders of the defined types were placed in this encounter.    Return for 3-4wks s/p retinopexy OS on 08.14.25, DFE, OCT.  There are no Patient Instructions on file for this visit.  This document serves as a record of services personally performed by Redell JUDITHANN Hans, MD, PhD. It was created on their behalf by Delon Newness COT, an ophthalmic technician. The creation of this record is the provider's dictation and/or activities during the visit.    Electronically signed by: Delon Newness COT 08.03.2025  1:51 AM   This document serves as a record of services personally performed by Redell JUDITHANN Hans, MD, PhD. It was created on their behalf by Almetta Pesa, an ophthalmic technician. The creation of this record is the provider's dictation and/or activities during the visit.    Electronically signed by: Almetta Pesa, OA, 06/20/24  1:51 AM  Redell JUDITHANN Hans, M.D., Ph.D. Diseases & Surgery of the Retina and Vitreous [facility] 06/15/2024   I have reviewed the above documentation for accuracy and completeness, and I agree with the above. Redell JUDITHANN Hans, M.D., Ph.D. 06/20/24 1:54 AM   Abbreviations: M myopia (nearsighted); A astigmatism; H hyperopia (farsighted); P presbyopia; Mrx spectacle prescription;  CTL contact lenses; OD right eye; OS left eye; OU both eyes  XT  exotropia; ET esotropia; PEK punctate epithelial keratitis; PEE punctate epithelial erosions; DES dry eye syndrome; MGD meibomian gland dysfunction; ATs artificial tears; PFAT's preservative free artificial tears; NSC nuclear sclerotic cataract; PSC posterior subcapsular cataract; ERM epi-retinal membrane; PVD posterior vitreous detachment; RD retinal detachment; DM diabetes mellitus; DR diabetic retinopathy; NPDR non-proliferative diabetic retinopathy; PDR proliferative diabetic retinopathy; CSME clinically significant macular edema; DME diabetic macular edema; dbh dot blot hemorrhages; CWS cotton wool spot; POAG primary open angle glaucoma; C/D cup-to-disc ratio; HVF humphrey visual field; GVF goldmann visual field; OCT optical coherence tomography; IOP intraocular pressure; BRVO Branch retinal vein occlusion; CRVO central retinal vein occlusion; CRAO central retinal artery occlusion; BRAO branch retinal artery occlusion; RT retinal tear; SB scleral buckle; PPV pars plana vitrectomy; VH Vitreous hemorrhage; PRP panretinal laser photocoagulation; IVK intravitreal kenalog ; VMT vitreomacular traction; MH Macular hole;  NVD neovascularization of the disc; NVE neovascularization elsewhere; AREDS age related eye disease study; ARMD age related macular degeneration; POAG primary open angle glaucoma; EBMD epithelial/anterior basement membrane dystrophy; ACIOL anterior chamber intraocular lens; IOL intraocular lens; PCIOL posterior chamber intraocular lens; Phaco/IOL phacoemulsification with intraocular lens placement; PRK photorefractive keratectomy; LASIK laser assisted in situ keratomileusis; HTN hypertension; DM diabetes mellitus; COPD chronic obstructive pulmonary disease

## 2024-06-14 ENCOUNTER — Encounter (HOSPITAL_COMMUNITY): Payer: Self-pay | Admitting: Urology

## 2024-06-14 NOTE — Anesthesia Postprocedure Evaluation (Signed)
 Anesthesia Post Note  Patient: [name]  Procedure(s) Performed: CYSTOSCOPY/URETEROSCOPY/HOLMIUM LASER/STENT PLACEMENT (Left: Bladder)     Patient location during evaluation: PACU Anesthesia Type: General Level of consciousness: awake Pain management: pain level controlled Vital Signs Assessment: post-procedure vital signs reviewed and stable Respiratory status: spontaneous breathing, nonlabored ventilation and respiratory function stable Cardiovascular status: blood pressure returned to baseline and stable Postop Assessment: no apparent nausea or vomiting Anesthetic complications: no   No notable events documented.  Last Vitals:  Vitals:   06/13/24 1345 06/13/24 1400  BP: 129/80 124/81  Pulse: 74 77  Resp: 15 13  Temp:    SpO2: 100% 99%    Last Pain:  Vitals:   06/13/24 1400  TempSrc:   PainSc: 0-No pain   Pain Goal:                   [name]

## 2024-06-15 ENCOUNTER — Ambulatory Visit (INDEPENDENT_AMBULATORY_CARE_PROVIDER_SITE_OTHER): Admitting: Ophthalmology

## 2024-06-15 ENCOUNTER — Encounter (INDEPENDENT_AMBULATORY_CARE_PROVIDER_SITE_OTHER): Payer: Self-pay | Admitting: Ophthalmology

## 2024-06-15 DIAGNOSIS — H3521 Other non-diabetic proliferative retinopathy, right eye: Secondary | ICD-10-CM

## 2024-06-15 DIAGNOSIS — H33322 Round hole, left eye: Secondary | ICD-10-CM

## 2024-06-15 DIAGNOSIS — H3341 Traction detachment of retina, right eye: Secondary | ICD-10-CM | POA: Diagnosis not present

## 2024-06-15 DIAGNOSIS — I10 Essential (primary) hypertension: Secondary | ICD-10-CM | POA: Diagnosis not present

## 2024-06-15 DIAGNOSIS — H33103 Unspecified retinoschisis, bilateral: Secondary | ICD-10-CM | POA: Diagnosis not present

## 2024-06-15 DIAGNOSIS — H353211 Exudative age-related macular degeneration, right eye, with active choroidal neovascularization: Secondary | ICD-10-CM

## 2024-06-15 DIAGNOSIS — Z961 Presence of intraocular lens: Secondary | ICD-10-CM

## 2024-06-15 DIAGNOSIS — H35711 Central serous chorioretinopathy, right eye: Secondary | ICD-10-CM

## 2024-06-15 DIAGNOSIS — H5789 Other specified disorders of eye and adnexa: Secondary | ICD-10-CM

## 2024-06-15 DIAGNOSIS — H35033 Hypertensive retinopathy, bilateral: Secondary | ICD-10-CM

## 2024-06-15 DIAGNOSIS — H3322 Serous retinal detachment, left eye: Secondary | ICD-10-CM

## 2024-06-15 DIAGNOSIS — H25813 Combined forms of age-related cataract, bilateral: Secondary | ICD-10-CM

## 2024-06-20 ENCOUNTER — Encounter (INDEPENDENT_AMBULATORY_CARE_PROVIDER_SITE_OTHER): Payer: Self-pay | Admitting: Ophthalmology

## 2024-07-11 NOTE — Progress Notes (Signed)
 [facility] - Clinic Note  07/13/2024     CHIEF COMPLAINT Patient presents for Retina Follow Up   HISTORY OF PRESENT ILLNESS: [name] is a 53 y.o. female who presents to the clinic today for:  HPI     Retina Follow Up   Diagnosis: laser retinopexy.  In left eye.  This started 3 weeks ago.  Severity is severe.  Duration of 3 weeks.  Since onset it is stable.  I, the attending physician,  performed the HPI with the patient and updated documentation appropriately.        Comments   Patient feels the vision is the same. She is using PredForte TID OU, and Prolensa  TID OD.      Last edited by Valdemar Rogue, MD on 07/15/2024  1:11 AM.    Patient states she's doing alright, having some pain in OD, less sleep due to work. Pt reports taking pink top, grey top OD TID, still taking pink top in OS s/p laser.   Referring physician: Mason Haywood LABOR, PA-C [address] [place],  [place] [address]  HISTORICAL INFORMATION:   Selected notes from the MEDICAL RECORD NUMBER Referred by Dr. Devere Kitty for concern of sub-macular fluid / retinoschisis   CURRENT MEDICATIONS: Current Outpatient Medications (Ophthalmic Drugs)  Medication Sig   bacitracin -polymyxin b  (POLYSPORIN ) ophthalmic ointment Place into the right eye 4 (four) times daily. (Patient taking differently: Place 1 Application into the right eye at bedtime.)   ketorolac  (ACULAR ) 0.5 % ophthalmic solution Place 1 drop into the left eye 4 (four) times daily. (Patient taking differently: Place 1 drop into the right eye 4 (four) times daily.)   prednisoLONE  acetate (PRED FORTE ) 1 % ophthalmic suspension Place 1 drop into the left eye 4 (four) times daily. (Patient taking differently: Place 1 drop into both eyes 4 (four) times daily.)   No current facility-administered medications for this visit. (Ophthalmic Drugs)   Current Outpatient Medications (Other)  Medication Sig    hydrochlorothiazide  (HYDRODIURIL ) 25 MG tablet Take 25 mg by mouth daily.   HYDROcodone -acetaminophen  (NORCO/VICODIN) 5-325 MG tablet Take 1-2 tablets by mouth every 6 (six) hours as needed for moderate pain (pain score 4-6) or severe pain (pain score 7-10).   lisinopril  (ZESTRIL ) 20 MG tablet Take 20 mg by mouth daily.   oxybutynin  (DITROPAN ) 5 MG tablet Take 1 tablet (5 mg total) by mouth every 8 (eight) hours as needed for bladder spasms.   phenazopyridine  (PYRIDIUM ) 200 MG tablet Take 1 tablet (200 mg total) by mouth 3 (three) times daily as needed (for pain with urination).   No current facility-administered medications for this visit. (Other)   REVIEW OF SYSTEMS: ROS   Positive for: Cardiovascular, Eyes Negative for: Constitutional, Gastrointestinal, Neurological, Skin, Genitourinary, Musculoskeletal, HENT, Endocrine, Respiratory, Psychiatric, Allergic/Imm, Heme/Lymph Last edited by Myra Wanda SAILOR, COT on 07/13/2024 12:57 PM.      ALLERGIES Allergies  Allergen Reactions   Pollen Extract Itching    POLLEN EXTRACTS    PAST MEDICAL HISTORY Past Medical History:  Diagnosis Date   Abnormal colonoscopy 10/30/2019   Impression: - One 12 mm polyp in the transverse colon, removed with a cold snare. Resected and retrieved. One 2 mm polyp in the sigmoid colon, removed with a cold snare. Resected and retrieved. Diverticulosis in the ascending colon. Non-bleeding internal hemorrhoids. Return to GI PRN. Repeat in 3 years. May be amenable to hermorrhoid band ligation.     Benign  neoplasm of ascending colon 01/28/2023   12/14/2022     Benign neoplasm of transverse colon 10/30/2019   Cervical dysplasia    CIN I (cervical intraepithelial neoplasia I)    Grade II hemorrhoids 01/28/2023   12/14/2022     High risk HPV infection    History of abnormal cervical Pap smear    12-28-21 lgsil HPV HR +   History of adenomatous polyp of colon    History of infection due to human papilloma virus  (HPV) 11/20/2019   Present again 06/19/2020 Not present 03/11/2023     History of kidney stones    Hypertension    Hypertensive retinopathy    OU   Macular degeneration, right eye    Missed abortion 05/04/2023   05/16/2013     Polyp of sigmoid colon 10/30/2019   Polyp of transverse colon 10/30/2019   Retinal defect, 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   followed by dr Qiara Minetti;  pt takes  inspra  for excess fluid in retina,   Sciatic nerve injury    Seasonal allergies    Vaginal dysplasia    Wears glasses    Past Surgical History:  Procedure Laterality Date   CERVICAL CONIZATION W/BX N/A 06/30/2023   Procedure: CONIZATION CERVIX WITH BIOPSY, ENDOCERVICAL CURETTAGE;  Surgeon: Viktoria Comer SAUNDERS, MD;  Location[facility];  Service: Gynecology;  Laterality: N/A;   CO2 LASER APPLICATION N/A 06/30/2023   Procedure: CO2 LASER TO THE VAGINA;  Surgeon: Viktoria Comer SAUNDERS, MD;  Location: [facility];  Service: Gynecology;  Laterality: N/A;   COLONOSCOPY  12/14/2022   CYSTOSCOPY W/ URETERAL STENT PLACEMENT Left 05/26/2024   Procedure: CYSTOSCOPY, WITH RETROGRADE PYELOGRAM AND URETERAL STENT INSERTION;  Surgeon: Devere Lonni Righter, MD;  Location: [facility];  Service: Urology;  Laterality: Left;   CYSTOSCOPY/URETEROSCOPY/HOLMIUM LASER/STENT PLACEMENT Left 06/13/2024   Procedure: CYSTOSCOPY/URETEROSCOPY/HOLMIUM LASER/STENT PLACEMENT;  Surgeon: Devere Lonni Righter, MD;  Location: [facility];  Service: Urology;  Laterality: Left;  CYSTOSCOPY/LEFT URETEROSCOPY/HOLMIUM LASER/STENT PLACEMENT   HYSTEROSCOPY WITH RESECTOSCOPE  11/25/2006   @[facility]  by dr jinny. winfred;   Resectoscopic myomectomy and polypectomy   INJECTION OF SILICONE OIL Right 09/15/2023   Procedure: INJECTION OF SILICONE OIL;  Surgeon: Valdemar Rogue, MD;  Location: [facility] OR;  Service: Ophthalmology;  Laterality: Right;   INJECTION OF SILICONE OIL Right 02/23/2024   Procedure: INJECTION, SILICONE OIL EXCHANGE;  Surgeon: Valdemar Rogue, MD;  Location: [facility] OR;  Service: Ophthalmology;  Laterality: Right;   PARS PLANA VITRECTOMY Right 09/15/2023   Procedure: PARS PLANA VITRECTOMY WITH 25 GAUGE;  Surgeon: Valdemar Rogue, MD;  Location: [facility] OR;  Service: Ophthalmology;  Laterality: Right;   PARS PLANA VITRECTOMY W/ FOCAL ENDOLASER PHOTOCOAGULATION Right 02/23/2024   Procedure: 25 GAUGE PARS PLANA VITRECTOMY  WITH LENSECTOMY, MEMBRANE PEEL, AND ENDOLASER;  Surgeon: Valdemar Rogue, MD;  Location: [facility] OR;  Service: Ophthalmology;  Laterality: Right;   REMOVAL RETAINED LENS Right 02/23/2024   Procedure: REMOVAL, RETAINED LENS MATTER;  Surgeon: Valdemar Rogue, MD;  Location: [facility] OR;  Service: Ophthalmology;  Laterality: Right;   REPAIR OF COMPLEX TRACTION RETINAL DETACHMENT Right 02/23/2024   Procedure: REPAIR, RETINAL DETACHMENT, COMPLEX;  Surgeon: Valdemar Rogue, MD;  Location: [facility] OR;  Service: Ophthalmology;  Laterality: Right;   VAGINECTOMY, PARTIAL N/A 06/30/2023   Procedure: SMALL VAGINECTOMY, PARTIAL BIOSPIES;  Surgeon: Viktoria Comer SAUNDERS, MD;  Location: [facility];  Service: Gynecology;  Laterality: N/A;   FAMILY HISTORY Family History  Problem Relation Age of Onset  Hypertension Mother    Diabetes Father    Diabetes Maternal Grandmother    Hypertension Maternal Grandmother    Colon cancer Neg Hx    Colon polyps Neg Hx    Esophageal cancer Neg Hx    Rectal cancer Neg Hx    Stomach cancer Neg Hx    SOCIAL HISTORY Social History   Tobacco Use   Smoking status: Never   Smokeless tobacco: Never  Vaping Use   Vaping status: Never Used  Substance Use Topics   Alcohol use: No    Alcohol/week: 0.0 standard drinks of alcohol   Drug use: Never       OPHTHALMIC EXAM: Base Eye Exam     Visual Acuity (Snellen - Linear)       Right Left   Dist cc HM 20/20    Correction: Glasses         Tonometry (Tonopen, 1:02 PM)       Right Left   Pressure 13 18         Pupils       Dark Light Shape  React APD   Right 5 4 Irregular Minimal NR   Left 5 4 Round Minimal None         Visual Fields       Left Right    Full    Restrictions  Total superior nasal, inferior nasal deficiencies         Extraocular Movement       Right Left    Full, Ortho Full, Ortho         Neuro/Psych     Oriented x3: Yes   Mood/Affect: Normal         Dilation     Both eyes: 1.0% Mydriacyl , 2.5% Phenylephrine  @ 12:59 PM           Slit Lamp and Fundus Exam     Slit Lamp Exam       Right Left   Lids/Lashes Dermatochalasis Dermatochalasis - upper lid   Conjunctiva/Sclera White and quiet temporally, nasal pinguecula 1+ Injection, nasal Pinguecula   Cornea Nasal ptergyium extending 2.20mm onto nasal cornea -- less inflamed, peripheral haze, well healed cataract wound, trace Punctate epithelial erosions, 1+ Guttata 1+Punctate epithelial erosions   Anterior Chamber Deep, silicone oil bubbles deep, narrow temporal angle, 1+pigment   Iris Irregular dilation, +PS temporally Round and dilated   Lens 3 piece sulcus IOL with mild nasal displacement and open PC 2+ Nuclear sclerosis, 2+ Cortical cataract   Anterior Vitreous post vitrectomy, good silicone oil fill, lens remnant gone Mild Vitreous syneresis         Fundus Exam       Right Left   Disc 3+pallor, Sharp rim Pink and Sharp   C/D Ratio 0.4 0.6   Macula flat under oil Flat, Good foveal reflex, No heme or edema   Vessels Attenuated, mild tortuosity; fibrosis improved Attenuated, Tortuous   Periphery 360 laser, flat under oil, relaxing retinotomy temporally, dense fibrosis temporal periphery improved Bullous schisis cavity IT periphery (0300-0430)--good laser changes surrounding, new outer retinal holes w/ +SRF extending posterior to laser--focal RD--good laser changes surrounding; smaller schisis cavity ST periphery from 0100-0200 -- mild laser changes surrounding, no new RT/RD           IMAGING AND PROCEDURES  Imaging and  Procedures for @TODAY @  OCT, Retina - OU - Both Eyes       Right Eye Quality was borderline. Central Foveal Thickness: 344.  Progression has been stable. Findings include no IRF, no SRF, abnormal foveal contour, subretinal hyper-reflective material, epiretinal membrane, pigment epithelial detachment, outer retinal atrophy (Better image quality, retina stably reattached, diffuse atrophy, scattered cystic changes).   Left Eye Quality was good. Central Foveal Thickness: 303. Progression has been stable. Findings include normal foveal contour, no IRF, no SRF, vitreomacular adhesion (bullous schisis cavity IT periphery w/ SRF caught on widefield-not imaged today).   Notes *Images captured and stored on drive  Diagnosis / Impression:  OD: Better image quality, retina stably reattached, diffuse atrophy, scattered cystic changes OS: bullous schisis cavity IT periphery w/ SRF caught on widefield--not imaged today  Clinical management:  See below  Abbreviations: NFP - Normal foveal profile. CME - cystoid macular edema. PED - pigment epithelial detachment. IRF - intraretinal fluid. SRF - subretinal fluid. EZ - ellipsoid zone. ERM - epiretinal membrane. ORA - outer retinal atrophy. ORT - outer retinal tubulation. SRHM - subretinal hyper-reflective material            ASSESSMENT/PLAN:    ICD-10-CM   1. Ocular inflammation  H57.89     2. 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 Traction detachment of right retina  H33.41     4. 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 Left retinal detachment  H33.22     8. Retinal hole of left eye  H33.322     9. Essential hypertension  I10     10. Hypertensive retinopathy of both eyes  H35.033      11. Combined forms of age-related cataract of both eyes  H25.813     12. Pseudophakia  Z96.1      1-3. Ocular inflammation and PVR retinal detachment OD - 10.16.24 pt started seeing new floaters and sparks in vision - pt developed progressively worsening vision and vit haze - VA OD improved from LP to CF - pt was on PredForte q1h OD, Prolensa  QID OD, and 40 mg po Prednisone  daily - b-scan 12.04.24 shows significant mobile vit opacities, ?RD - s/p PPV/PFO/EL/FAX/SO OD, 12.05.2024  - undiluted vit samples obtained intra-op for gram stain and culture -- no organisms, but +WBCs - intra-op - clearance of vitreous haze showed extensive fibrosis and PVR tractional detachment -- silicon oil added at end of case - AC was shallow with angle closed -- but now deep s/p CEIOL - exam showed residual intraretinal fibrosis and persistent SRF - POM4 s/p PPV/lensectomy/membrane peel/relaxing retinotomy/silicon oil exchange OD, 05.15.2025             - doing well - lens material gone; good oil fill             - IOP okay at 13             - cont   PF QID OD- decrease to TID                         ketorolac  QID OD- decrease to TID                         PSO ung bedtime / PRN OD             -  post op drop and positioning instructions reviewed              - tylenol /ibuprofen  for pain   - returned to work on Monday, March 19, 2024 -- no straining, no lifting more than 10-15lbs, no pushing or pulling - f/u 4-6 weeks  4,5. CSCR OD -- chronic  - pt reports decreased vision OD since December 2019  - reports significant stressors in life -- work  - denies steroid use - FA (09.30.20) shows focal hyperfluorescent leakage points nasal macula OD -- expansile dot phenotype? - started 50 mg po eplerenone  since 09.30.20  -- pt self d/c in February, re-started in June 2021 - s/p IVA OD #1 (09.01.21), #2 (09.29.21), #3 (11.03.21), #4 (12.03.21) -- IVA resistance - s/p IVE OD #1 (01.20.22), #2 (02.18.22), #3  (04.05.22-sample), #4 (05.23.22), # 5 (06.27.22), #6 (07.28.22), #7 (08.26.22), #8 (09.23.22), #9 (10.21.22), #10 (11.18.22), #11 (12.16.22), #12 (01.20.23), #13 (03.02.23), #14 (04.07.23), #15 (05.12.23), #16 (06.09.23), #17 (07.14.23), #18 (08.08.23), #19 (09.08.23), #20 (10.06.23), #21 (11.06.23), #22 (12.04.23), #23 (01.08.24), #24 (02.12.24), #25 (03.18.24), #26 (04.22.24), #27 (05.20.24) -- IVE resistance ======================================================== - s/p IVV OD #1 (07.02.24), #2 (07.30.24), #3 (08.27.24), #4 (09.26.24), #5 (10.24.24)  - differential includes atypical exudative ARMD - IVV informed consent obtained and signed, 07.02.24  - stop po eplerenone  50 mg daily for now  - monitor  6-8. Retinoschisis OU (OD > OS)  - bullous retinoschisis cavity IT quadrant OD-- 9299-9169 oclock  - bullous retinoschisis cavity IT quadrant OS -- 9669-9569 oclock - widefield [employer] imaging (04.22.24) shows mild interval progression of schisis cavities' size and posterior extension in comparison to baseline images -- widefield [employer] imaging (08.13.25) OS showed Bullous schisis cavity IT periphery, good laser changes w/ new Outer retinal holes w/ +SRF extending posterior to laser--focal RD; schisis cavity ST periphery  - s/p laser retinopexy OS 08.12.24 -- good laser changes surrounding - s/p laser retinopexy OD (10.07.24) - new schisis cavity found on examination OD (10.28.24) - 1030 periphery -- no RT/RD on scleral depressed exam - s/p laser retinopexy OD to new schisis at 1030 on 10.28.24 - schisis cavity found on exam and imaging OS 08.14.25 -- ST periphery - s/p laser retinopexy OS  08.14.25  for focal RD/schisis OS - exam on 10.3.25 shows just mild laser changes around ST schisis  - recommend reinforcing w/ additional laser retinopexy OS--will return Monday 10.06.25. - f/u Monday 10.06.25 at 945 for retinopexy OS - DFE/OCT  9,10. Hypertensive retinopathy OU  - discussed importance of  tight BP control  - monitor  11. Mixed Cataract OS - The symptoms of cataract, surgical options, and treatments and risks were discussed with patient. - discussed diagnosis and progression - not yet visually significant - monitor   12,13. Pseudophakia OD  - s/p CE/IOL OD (Dr. Cleopatra, 04.04.25)  - complicated PC rupture and retained lens material in vitreous  - s/p PPV/ lensectomy -- see above  - 3-piece IOL with mild nasal displacement but appears stably scarred in place  - monitor  Ophthalmic Meds Ordered this visit:  No orders of the defined types were placed in this encounter.    Return in about 3 days (around 07/16/2024) for 945 for fill in retinopexy OS.  There are no Patient Instructions on file for this visit.  This document serves as a record of services personally performed by Redell JUDITHANN Hans, MD, PhD. It was created on their behalf by Delon Newness COT, an ophthalmic technician. The  creation of this record is the provider's dictation and/or activities during the visit.    Electronically signed by: Delon Newness COT 10.01.2025 1:18 AM  This document serves as a record of services personally performed by Redell JUDITHANN Hans, MD, PhD. It was created on their behalf by Almetta Pesa, an ophthalmic technician. The creation of this record is the provider's dictation and/or activities during the visit.    Electronically signed by: Almetta Pesa, OA, 07/15/24  1:18 AM  Redell JUDITHANN Hans, M.D., Ph.D. Diseases & Surgery of the Retina and Vitreous [facility] 07/13/2024   I have reviewed the above documentation for accuracy and completeness, and I agree with the above. Redell JUDITHANN Hans, M.D., Ph.D. 07/15/24 1:30 AM   Abbreviations: M myopia (nearsighted); A astigmatism; H hyperopia (farsighted); P presbyopia; Mrx spectacle prescription;  CTL contact lenses; OD right eye; OS left eye; OU both eyes  XT exotropia; ET esotropia; PEK punctate  epithelial keratitis; PEE punctate epithelial erosions; DES dry eye syndrome; MGD meibomian gland dysfunction; ATs artificial tears; PFAT's preservative free artificial tears; NSC nuclear sclerotic cataract; PSC posterior subcapsular cataract; ERM epi-retinal membrane; PVD posterior vitreous detachment; RD retinal detachment; DM diabetes mellitus; DR diabetic retinopathy; NPDR non-proliferative diabetic retinopathy; PDR proliferative diabetic retinopathy; CSME clinically significant macular edema; DME diabetic macular edema; dbh dot blot hemorrhages; CWS cotton wool spot; POAG primary open angle glaucoma; C/D cup-to-disc ratio; HVF humphrey visual field; GVF goldmann visual field; OCT optical coherence tomography; IOP intraocular pressure; BRVO Branch retinal vein occlusion; CRVO central retinal vein occlusion; CRAO central retinal artery occlusion; BRAO branch retinal artery occlusion; RT retinal tear; SB scleral buckle; PPV pars plana vitrectomy; VH Vitreous hemorrhage; PRP panretinal laser photocoagulation; IVK intravitreal kenalog ; VMT vitreomacular traction; MH Macular hole;  NVD neovascularization of the disc; NVE neovascularization elsewhere; AREDS age related eye disease study; ARMD age related macular degeneration; POAG primary open angle glaucoma; EBMD epithelial/anterior basement membrane dystrophy; ACIOL anterior chamber intraocular lens; IOL intraocular lens; PCIOL posterior chamber intraocular lens; Phaco/IOL phacoemulsification with intraocular lens placement; PRK photorefractive keratectomy; LASIK laser assisted in situ keratomileusis; HTN hypertension; DM diabetes mellitus; COPD chronic obstructive pulmonary disease

## 2024-07-13 ENCOUNTER — Ambulatory Visit (INDEPENDENT_AMBULATORY_CARE_PROVIDER_SITE_OTHER): Admitting: Ophthalmology

## 2024-07-13 DIAGNOSIS — H35711 Central serous chorioretinopathy, right eye: Secondary | ICD-10-CM

## 2024-07-13 DIAGNOSIS — I10 Essential (primary) hypertension: Secondary | ICD-10-CM

## 2024-07-13 DIAGNOSIS — H25813 Combined forms of age-related cataract, bilateral: Secondary | ICD-10-CM

## 2024-07-13 DIAGNOSIS — H353211 Exudative age-related macular degeneration, right eye, with active choroidal neovascularization: Secondary | ICD-10-CM | POA: Diagnosis not present

## 2024-07-13 DIAGNOSIS — H33103 Unspecified retinoschisis, bilateral: Secondary | ICD-10-CM | POA: Diagnosis not present

## 2024-07-13 DIAGNOSIS — H3341 Traction detachment of retina, right eye: Secondary | ICD-10-CM

## 2024-07-13 DIAGNOSIS — H5789 Other specified disorders of eye and adnexa: Secondary | ICD-10-CM

## 2024-07-13 DIAGNOSIS — H3521 Other non-diabetic proliferative retinopathy, right eye: Secondary | ICD-10-CM

## 2024-07-13 DIAGNOSIS — H33322 Round hole, left eye: Secondary | ICD-10-CM

## 2024-07-13 DIAGNOSIS — H35033 Hypertensive retinopathy, bilateral: Secondary | ICD-10-CM

## 2024-07-13 DIAGNOSIS — H3322 Serous retinal detachment, left eye: Secondary | ICD-10-CM

## 2024-07-13 DIAGNOSIS — Z961 Presence of intraocular lens: Secondary | ICD-10-CM

## 2024-07-15 ENCOUNTER — Encounter (INDEPENDENT_AMBULATORY_CARE_PROVIDER_SITE_OTHER): Payer: Self-pay | Admitting: Ophthalmology

## 2024-07-16 ENCOUNTER — Encounter (INDEPENDENT_AMBULATORY_CARE_PROVIDER_SITE_OTHER): Payer: Self-pay | Admitting: Ophthalmology

## 2024-07-16 ENCOUNTER — Ambulatory Visit (INDEPENDENT_AMBULATORY_CARE_PROVIDER_SITE_OTHER): Admitting: Ophthalmology

## 2024-07-16 DIAGNOSIS — H33103 Unspecified retinoschisis, bilateral: Secondary | ICD-10-CM

## 2024-07-16 DIAGNOSIS — H3322 Serous retinal detachment, left eye: Secondary | ICD-10-CM

## 2024-07-16 DIAGNOSIS — H3521 Other non-diabetic proliferative retinopathy, right eye: Secondary | ICD-10-CM

## 2024-07-16 DIAGNOSIS — H59021 Cataract (lens) fragments in eye following cataract surgery, right eye: Secondary | ICD-10-CM

## 2024-07-16 DIAGNOSIS — H35033 Hypertensive retinopathy, bilateral: Secondary | ICD-10-CM

## 2024-07-16 DIAGNOSIS — Z961 Presence of intraocular lens: Secondary | ICD-10-CM

## 2024-07-16 DIAGNOSIS — H25813 Combined forms of age-related cataract, bilateral: Secondary | ICD-10-CM

## 2024-07-16 DIAGNOSIS — H353211 Exudative age-related macular degeneration, right eye, with active choroidal neovascularization: Secondary | ICD-10-CM

## 2024-07-16 DIAGNOSIS — H5789 Other specified disorders of eye and adnexa: Secondary | ICD-10-CM

## 2024-07-16 DIAGNOSIS — H35711 Central serous chorioretinopathy, right eye: Secondary | ICD-10-CM

## 2024-07-16 DIAGNOSIS — I10 Essential (primary) hypertension: Secondary | ICD-10-CM

## 2024-07-16 DIAGNOSIS — H33322 Round hole, left eye: Secondary | ICD-10-CM

## 2024-07-16 DIAGNOSIS — H3341 Traction detachment of retina, right eye: Secondary | ICD-10-CM

## 2024-07-16 MED ORDER — PREDNISOLONE ACETATE 1 % OP SUSP
1.0000 [drp] | Freq: Four times a day (QID) | OPHTHALMIC | 0 refills | Status: AC
Start: 2024-07-16 — End: 2024-07-23

## 2024-07-16 NOTE — Progress Notes (Signed)
 [facility] - Clinic Note  07/16/2024     CHIEF COMPLAINT Patient presents for Retina Follow Up   HISTORY OF PRESENT ILLNESS: [name] is a 52 y.o. female who presents to the clinic today for:  HPI     Retina Follow Up   Diagnosis: laser retinopexy.  In left eye.  This started 3 weeks ago.  Severity is severe.  Duration of 3 weeks.  Since onset it is stable.  I, the attending physician,  performed the HPI with the patient and updated documentation appropriately.        Comments   Pt presents for 3 days (around 07/16/2024) for fill in retinopexy OS. Patient feels the vision is the same. She is using PredForte TID OU, and Prolensa  TID OD.      Last edited by Valdemar Rogue, MD on 07/16/2024 12:29 PM.    Patient states she's doing alright  Referring physician: Mason Haywood LABOR, PA-C [address] [place],  [place] [address]  HISTORICAL INFORMATION:   Selected notes from the MEDICAL RECORD NUMBER Referred by Dr. Devere Kitty for concern of sub-macular fluid / retinoschisis   CURRENT MEDICATIONS: Current Outpatient Medications (Ophthalmic Drugs)  Medication Sig   prednisoLONE  acetate (PRED FORTE ) 1 % ophthalmic suspension Place 1 drop into the left eye 4 (four) times daily for 7 days.   bacitracin -polymyxin b  (POLYSPORIN ) ophthalmic ointment Place into the right eye 4 (four) times daily. (Patient taking differently: Place 1 Application into the right eye at bedtime.)   ketorolac  (ACULAR ) 0.5 % ophthalmic solution Place 1 drop into the left eye 4 (four) times daily. (Patient taking differently: Place 1 drop into the right eye 4 (four) times daily.)   prednisoLONE  acetate (PRED FORTE ) 1 % ophthalmic suspension Place 1 drop into the left eye 4 (four) times daily. (Patient taking differently: Place 1 drop into both eyes 4 (four) times daily.)   No current facility-administered medications for this visit. (Ophthalmic Drugs)    Current Outpatient Medications (Other)  Medication Sig   hydrochlorothiazide  (HYDRODIURIL ) 25 MG tablet Take 25 mg by mouth daily.   HYDROcodone -acetaminophen  (NORCO/VICODIN) 5-325 MG tablet Take 1-2 tablets by mouth every 6 (six) hours as needed for moderate pain (pain score 4-6) or severe pain (pain score 7-10).   lisinopril  (ZESTRIL ) 20 MG tablet Take 20 mg by mouth daily.   oxybutynin  (DITROPAN ) 5 MG tablet Take 1 tablet (5 mg total) by mouth every 8 (eight) hours as needed for bladder spasms.   phenazopyridine  (PYRIDIUM ) 200 MG tablet Take 1 tablet (200 mg total) by mouth 3 (three) times daily as needed (for pain with urination).   No current facility-administered medications for this visit. (Other)   REVIEW OF SYSTEMS: ROS   Positive for: Cardiovascular, Eyes Negative for: Constitutional, Gastrointestinal, Neurological, Skin, Genitourinary, Musculoskeletal, HENT, Endocrine, Respiratory, Psychiatric, Allergic/Imm, Heme/Lymph Last edited by Elnor Avelina RAMAN, COT on 07/16/2024  9:43 AM.     ALLERGIES Allergies  Allergen Reactions   Pollen Extract Itching    POLLEN EXTRACTS   PAST MEDICAL HISTORY Past Medical History:  Diagnosis Date   Abnormal colonoscopy 10/30/2019   Impression: - One 12 mm polyp in the transverse colon, removed with a cold snare. Resected and retrieved. One 2 mm polyp in the sigmoid colon, removed with a cold snare. Resected and retrieved. Diverticulosis in the ascending colon. Non-bleeding internal hemorrhoids. Return to GI PRN. Repeat in 3 years. May be amenable to hermorrhoid band  ligation.     Benign neoplasm of ascending colon 01/28/2023   12/14/2022     Benign neoplasm of transverse colon 10/30/2019   Cervical dysplasia    CIN I (cervical intraepithelial neoplasia I)    Grade II hemorrhoids 01/28/2023   12/14/2022     High risk HPV infection    History of abnormal cervical Pap smear    12-28-21 lgsil HPV HR +   History of adenomatous polyp of colon     History of infection due to human papilloma virus (HPV) 11/20/2019   Present again 06/19/2020 Not present 03/11/2023     History of kidney stones    Hypertension    Hypertensive retinopathy    OU   Macular degeneration, right eye    Missed abortion 05/04/2023   05/16/2013     Polyp of sigmoid colon 10/30/2019   Polyp of transverse colon 10/30/2019   Retinal defect, 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   followed by dr Jin Capote;  pt takes  inspra  for excess fluid in retina,   Sciatic nerve injury    Seasonal allergies    Vaginal dysplasia    Wears glasses    Past Surgical History:  Procedure Laterality Date   CERVICAL CONIZATION W/BX N/A 06/30/2023   Procedure: CONIZATION CERVIX WITH BIOPSY, ENDOCERVICAL CURETTAGE;  Surgeon: Viktoria Comer SAUNDERS, MD;  Location: [facility];  Service: Gynecology;  Laterality: N/A;   CO2 LASER APPLICATION N/A 06/30/2023   Procedure: CO2 LASER TO THE VAGINA;  Surgeon: Viktoria Comer SAUNDERS, MD;  Location: [facility];  Service: Gynecology;  Laterality: N/A;   COLONOSCOPY  12/14/2022   CYSTOSCOPY W/ URETERAL STENT PLACEMENT Left 05/26/2024   Procedure: CYSTOSCOPY, WITH RETROGRADE PYELOGRAM AND URETERAL STENT INSERTION;  Surgeon: Devere Lonni Righter, MD;  Location: [facility] OR;  Service: Urology;  Laterality: Left;   CYSTOSCOPY/URETEROSCOPY/HOLMIUM LASER/STENT PLACEMENT Left 06/13/2024   Procedure: CYSTOSCOPY/URETEROSCOPY/HOLMIUM LASER/STENT PLACEMENT;  Surgeon: Devere Lonni Righter, MD;  Location: [facility];  Service: Urology;  Laterality: Left;  CYSTOSCOPY/LEFT URETEROSCOPY/HOLMIUM LASER/STENT PLACEMENT   HYSTEROSCOPY WITH RESECTOSCOPE  11/25/2006   @[facility]  by dr jinny. winfred;   Resectoscopic myomectomy and polypectomy   INJECTION OF SILICONE OIL Right 09/15/2023   Procedure: INJECTION OF SILICONE OIL;  Surgeon: Valdemar Rogue, MD;  Location: [facility] OR;  Service: Ophthalmology;  Laterality: Right;   INJECTION OF SILICONE OIL Right 02/23/2024    Procedure: INJECTION, SILICONE OIL EXCHANGE;  Surgeon: Valdemar Rogue, MD;  Location: [facility] OR;  Service: Ophthalmology;  Laterality: Right;   PARS PLANA VITRECTOMY Right 09/15/2023   Procedure: PARS PLANA VITRECTOMY WITH 25 GAUGE;  Surgeon: Valdemar Rogue, MD;  Location: [facility] OR;  Service: Ophthalmology;  Laterality: Right;   PARS PLANA VITRECTOMY W/ FOCAL ENDOLASER PHOTOCOAGULATION Right 02/23/2024   Procedure: 25 GAUGE PARS PLANA VITRECTOMY  WITH LENSECTOMY, MEMBRANE PEEL, AND ENDOLASER;  Surgeon: Valdemar Rogue, MD;  Location: [facility] OR;  Service: Ophthalmology;  Laterality: Right;   REMOVAL RETAINED LENS Right 02/23/2024   Procedure: REMOVAL, RETAINED LENS MATTER;  Surgeon: Valdemar Rogue, MD;  Location: [facility] OR;  Service: Ophthalmology;  Laterality: Right;   REPAIR OF COMPLEX TRACTION RETINAL DETACHMENT Right 02/23/2024   Procedure: REPAIR, RETINAL DETACHMENT, COMPLEX;  Surgeon: Valdemar Rogue, MD;  Location: [facility] OR;  Service: Ophthalmology;  Laterality: Right;   VAGINECTOMY, PARTIAL N/A 06/30/2023   Procedure: SMALL VAGINECTOMY, PARTIAL BIOSPIES;  Surgeon: Viktoria Comer SAUNDERS, MD;  Location: [facility];  Service: Gynecology;  Laterality: N/A;   FAMILY HISTORY Family History  Problem Relation Age of Onset   Hypertension Mother    Diabetes Father    Diabetes Maternal Grandmother    Hypertension Maternal Grandmother    Colon cancer Neg Hx    Colon polyps Neg Hx    Esophageal cancer Neg Hx    Rectal cancer Neg Hx    Stomach cancer Neg Hx    SOCIAL HISTORY Social History   Tobacco Use   Smoking status: Never   Smokeless tobacco: Never  Vaping Use   Vaping status: Never Used  Substance Use Topics   Alcohol use: No    Alcohol/week: 0.0 standard drinks of alcohol   Drug use: Never       OPHTHALMIC EXAM: Base Eye Exam     Visual Acuity (Snellen - Linear)       Right Left   Dist cc HM 20/20 -2    Correction: Glasses         Tonometry (Tonopen, 9:48 AM)       Right Left    Pressure 14 14         Pupils       Pupils Dark Light Shape React APD   Right PERRL 5 4 Round Brisk None   Left PERRL 5 4 Round Brisk None         Visual Fields       Left Right    Full    Restrictions  Total superior nasal, inferior nasal deficiencies         Extraocular Movement       Right Left    Full, Ortho Full, Ortho         Neuro/Psych     Oriented x3: Yes   Mood/Affect: Normal         Dilation     Left eye: 2.5% Phenylephrine  @ 9:48 AM           Slit Lamp and Fundus Exam     Slit Lamp Exam       Right Left   Lids/Lashes Dermatochalasis Dermatochalasis - upper lid   Conjunctiva/Sclera White and quiet temporally, nasal pinguecula 1+ Injection, nasal Pinguecula   Cornea Nasal ptergyium extending 2.12mm onto nasal cornea -- less inflamed, peripheral haze, well healed cataract wound, trace Punctate epithelial erosions, 1+ Guttata 1+Punctate epithelial erosions   Anterior Chamber Deep, silicone oil bubbles deep, narrow temporal angle, 1+pigment   Iris Irregular dilation, +PS temporally Round and dilated   Lens 3 piece sulcus IOL with mild nasal displacement and open PC 2+ Nuclear sclerosis, 2+ Cortical cataract   Anterior Vitreous post vitrectomy, good silicone oil fill, lens remnant gone Mild Vitreous syneresis         Fundus Exam       Right Left   Disc 3+pallor, Sharp rim Pink and Sharp   C/D Ratio 0.4 0.6   Macula flat under oil Flat, Good foveal reflex, No heme or edema   Vessels Attenuated, mild tortuosity; fibrosis improved Attenuated, Tortuous   Periphery 360 laser, flat under oil, relaxing retinotomy temporally, dense fibrosis temporal periphery improved Bullous schisis cavity IT periphery (0300-0430)--good laser changes surrounding, new outer retinal holes w/ +SRF extending posterior to laser--focal RD--good laser changes surrounding; smaller schisis cavity ST periphery from 0100-0200 -- mild laser changes surrounding, no new RT/RD            Refraction     Wearing Rx       Sphere Cylinder Axis   Right +1.25 +1.25 112  Left +2.25 +1.00 101           IMAGING AND PROCEDURES  Imaging and Procedures for @TODAY @  LASER PROCEDURE NOTE   Procedure:     Supplemental barrier laser retinopexy using slit lamp laser, LEFT eye    Diagnosis:       Retinoschisis w/ outer retinal holes and +SRF / focal RD, LEFT eye                       New retinal holes and focal RD at 0430 -- at posterior edge of IT schisis cavity                        Secondary retinoschisis cavity in ST quad - 0100-0200   Surgeon:        Redell Hans, MD, PhD   Anesthesia:    Topical   Informed consent obtained, operative eye marked, and time out performed prior to initiation of laser.    Laser settings:  Lumenis Smart532 laser, slit lamp Lens: Mainster PRP 165 Power: 270 mW Spot size: 500 microns Duration: 30 msec  # spots: 529   Placement of laser: Using a Mainster PRP 165 contact lens at the slit lamp, suupplemental laser was placed in three+ confluent rows around focal SRF at 0400 and posterior to ST schisis cavity from 0100-0200.   Complications: None.   Patient tolerated the procedure well and received written and verbal post-procedure care information/education.        ASSESSMENT/PLAN:    ICD-10-CM   1. Ocular inflammation  H57.89     2. 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 Traction detachment of right retina  H33.41     4. 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 Left retinal detachment  H33.22     8. Retinal hole of left eye  H33.322     9. Essential hypertension  I10     10. Hypertensive retinopathy of both eyes  H35.033     11. Combined forms of age-related  cataract of both eyes  H25.813     12. Pseudophakia  Z96.1     13. Retained lens matter of right eye  H59.021       1-3. Ocular inflammation and PVR retinal detachment OD - 10.16.24 pt started seeing new floaters and sparks in vision - pt developed progressively worsening vision and vit haze - VA OD improved from LP to CF - pt was on PredForte q1h OD, Prolensa  QID OD, and 40 mg po Prednisone  daily - b-scan 12.04.24 shows significant mobile vit opacities, ?RD - s/p PPV/PFO/EL/FAX/SO OD, 12.05.2024 - undiluted vit samples obtained intra-op for gram stain and culture -- no organisms, but +WBCs - intra-op - clearance of vitreous haze showed extensive fibrosis and PVR tractional detachment -- silicon oil added at end of case - AC was shallow with angle closed -- but now deep s/p CEIOL - exam showed residual intraretinal fibrosis and persistent SRF - POM4 s/p PPV/lensectomy/membrane peel/relaxing retinotomy/silicon oil exchange OD, 05.15.2025             - doing well - lens material gone; good oil fill             -  IOP okay at 13             - cont   PF TID OD                         ketorolac  TID OD                         PSO ung bedtime / PRN OD             - post op drop and positioning instructions reviewed              - tylenol /ibuprofen  for pain  - returned to work on Monday, March 19, 2024 -- no straining, no lifting more than 10-15lbs, no pushing or pulling - f/u 4-6 weeks  4,5. CSCR OD -- chronic  - pt reports decreased vision OD since December 2019  - reports significant stressors in life -- work  - denies steroid use - FA (09.30.20) shows focal hyperfluorescent leakage points nasal macula OD -- expansile dot phenotype? - started 50 mg po eplerenone  since 09.30.20  -- pt self d/c in February, re-started in June 2021 - s/p IVA OD #1 (09.01.21), #2 (09.29.21), #3 (11.03.21), #4 (12.03.21) -- IVA resistance - s/p IVE OD #1 (01.20.22), #2 (02.18.22), #3 (04.05.22-sample), #4  (05.23.22), # 5 (06.27.22), #6 (07.28.22), #7 (08.26.22), #8 (09.23.22), #9 (10.21.22), #10 (11.18.22), #11 (12.16.22), #12 (01.20.23), #13 (03.02.23), #14 (04.07.23), #15 (05.12.23), #16 (06.09.23), #17 (07.14.23), #18 (08.08.23), #19 (09.08.23), #20 (10.06.23), #21 (11.06.23), #22 (12.04.23), #23 (01.08.24), #24 (02.12.24), #25 (03.18.24), #26 (04.22.24), #27 (05.20.24) -- IVE resistance ======================================================== - s/p IVV OD #1 (07.02.24), #2 (07.30.24), #3 (08.27.24), #4 (09.26.24), #5 (10.24.24)  - differential includes atypical exudative ARMD - IVV informed consent obtained and signed, 07.02.24  - stop po eplerenone  50 mg daily for now  - monitor  6-8. Retinoschisis OU (OD > OS)  - bullous retinoschisis cavity IT quadrant OD-- 9299-9169 oclock  - bullous retinoschisis cavity IT quadrant OS -- 9669-9569 oclock - widefield Optos imaging (04.22.24) shows mild interval progression of schisis cavities' size and posterior extension in comparison to baseline images -- widefield Optos imaging (08.13.25) OS showed Bullous schisis cavity IT periphery, good laser changes w/ new Outer retinal holes w/ +SRF extending posterior to laser--focal RD; schisis cavity ST periphery  - s/p laser retinopexy OS 08.12.24 -- good laser changes surrounding - s/p laser retinopexy OD (10.07.24) - new schisis cavity found on examination OD (10.28.24) - 1030 periphery -- no RT/RD on scleral depressed exam - s/p laser retinopexy OD to new schisis at 1030 on 10.28.24 - schisis cavity found on exam and imaging OS 08.14.25 -- ST periphery - s/p laser retinopexy OS on 08.14.25 -- room for supplemental laser - recommend supplemental laser retinopexy today,10.06.25 for reinforcement of focal RD/schisis OS - RBA of procedure discussed, questions answered - informed consent obtained and signed - see procedure note  - start PF q2h x3 days, then QID x4 days, then stop - f/u 4-6wks -  DFE/OCT  9,10. Hypertensive retinopathy OU  - discussed importance of tight BP control  - monitor  11. Mixed Cataract OS - The symptoms of cataract, surgical options, and treatments and risks were discussed with patient. - discussed diagnosis and progression - not yet visually significant - monitor   12,13. Pseudophakia OD  - s/p CE/IOL OD (Dr. Cleopatra, 04.04.25)  - complicated PC rupture and retained lens material  in vitreous  - s/p PPV/ lensectomy -- see above  - 3-piece IOL with mild nasal displacement but appears stably scarred in place  - monitor  Ophthalmic Meds Ordered this visit:  Meds ordered this encounter  Medications   prednisoLONE  acetate (PRED FORTE ) 1 % ophthalmic suspension    Sig: Place 1 drop into the left eye 4 (four) times daily for 7 days.    Dispense:  10 mL    Refill:  0     Return for 4-6 wks, RD OD, DFE, OCT.  There are no Patient Instructions on file for this visit.  This document serves as a record of services personally performed by Redell JUDITHANN Hans, MD, PhD. It was created on their behalf by Wanda GEANNIE Keens, COT an ophthalmic technician. The creation of this record is the provider's dictation and/or activities during the visit.    Electronically signed by:  Wanda GEANNIE Keens, COT  07/16/24 12:38 PM  This document serves as a record of services personally performed by Redell JUDITHANN Hans, MD, PhD. It was created on their behalf by Almetta Pesa, an ophthalmic technician. The creation of this record is the provider's dictation and/or activities during the visit.    Electronically signed by: Almetta Pesa, OA, 07/16/24  12:38 PM  Redell JUDITHANN Hans, M.D., Ph.D. Diseases & Surgery of the Retina and Vitreous [facility]  I have reviewed the above documentation for accuracy and completeness, and I agree with the above. Redell JUDITHANN Hans, M.D., Ph.D. 07/16/24 12:45 PM    Abbreviations: M myopia (nearsighted); A  astigmatism; H hyperopia (farsighted); P presbyopia; Mrx spectacle prescription;  CTL contact lenses; OD right eye; OS left eye; OU both eyes  XT exotropia; ET esotropia; PEK punctate epithelial keratitis; PEE punctate epithelial erosions; DES dry eye syndrome; MGD meibomian gland dysfunction; ATs artificial tears; PFAT's preservative free artificial tears; NSC nuclear sclerotic cataract; PSC posterior subcapsular cataract; ERM epi-retinal membrane; PVD posterior vitreous detachment; RD retinal detachment; DM diabetes mellitus; DR diabetic retinopathy; NPDR non-proliferative diabetic retinopathy; PDR proliferative diabetic retinopathy; CSME clinically significant macular edema; DME diabetic macular edema; dbh dot blot hemorrhages; CWS cotton wool spot; POAG primary open angle glaucoma; C/D cup-to-disc ratio; HVF humphrey visual field; GVF goldmann visual field; OCT optical coherence tomography; IOP intraocular pressure; BRVO Branch retinal vein occlusion; CRVO central retinal vein occlusion; CRAO central retinal artery occlusion; BRAO branch retinal artery occlusion; RT retinal tear; SB scleral buckle; PPV pars plana vitrectomy; VH Vitreous hemorrhage; PRP panretinal laser photocoagulation; IVK intravitreal kenalog ; VMT vitreomacular traction; MH Macular hole;  NVD neovascularization of the disc; NVE neovascularization elsewhere; AREDS age related eye disease study; ARMD age related macular degeneration; POAG primary open angle glaucoma; EBMD epithelial/anterior basement membrane dystrophy; ACIOL anterior chamber intraocular lens; IOL intraocular lens; PCIOL posterior chamber intraocular lens; Phaco/IOL phacoemulsification with intraocular lens placement; PRK photorefractive keratectomy; LASIK laser assisted in situ keratomileusis; HTN hypertension; DM diabetes mellitus; COPD chronic obstructive pulmonary disease

## 2024-08-13 NOTE — Progress Notes (Signed)
 [facility] - Clinic Note  08/27/2024     CHIEF COMPLAINT Patient presents for Retina Follow Up   HISTORY OF PRESENT ILLNESS: [name] is a 53 y.o. female who presents to the clinic today for:  HPI     Retina Follow Up   This started 6 weeks ago.  Duration of 6 weeks.  Since onset it is stable.  I, the attending physician,  performed the HPI with the patient and updated documentation appropriately.        Comments   Retina follow up Ocular inflammation and PVR retinal detachment OD pt is reporting more watering and redness she stands with the heat vent right above her she denies any flashes or floaters she is using PF OD qid OD       Last edited by Valdemar Rogue, MD on 09/02/2024  2:56 PM.     Patient states two days ago her right eye became red, watery, and hurting. 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   Referring physician: Mason Haywood LABOR, PA-C [address] [place],  [place] [address]  HISTORICAL INFORMATION:   Selected notes from the MEDICAL RECORD NUMBER Referred by Dr. Devere Kitty for concern of sub-macular fluid / retinoschisis   CURRENT MEDICATIONS: Current Outpatient Medications (Ophthalmic Drugs)  Medication Sig   bacitracin -polymyxin b  (POLYSPORIN ) ophthalmic ointment Place into the right eye 4 (four) times daily. (Patient taking differently: Place 1 Application into the right eye at bedtime.)   ketorolac  (ACULAR ) 0.5 % ophthalmic solution Place 1 drop into the left eye 4 (four) times daily. (Patient taking differently: Place 1 drop into the right eye 4 (four) times daily.)   prednisoLONE  acetate (PRED FORTE ) 1 % ophthalmic suspension Place 1 drop into the left eye 2 (two) times daily.   No current facility-administered medications for this visit. (Ophthalmic Drugs)   Current Outpatient Medications (Other)  Medication Sig   hydrochlorothiazide  (HYDRODIURIL ) 25 MG tablet Take 25 mg by mouth daily.    HYDROcodone -acetaminophen  (NORCO/VICODIN) 5-325 MG tablet Take 1-2 tablets by mouth every 6 (six) hours as needed for moderate pain (pain score 4-6) or severe pain (pain score 7-10).   lisinopril  (ZESTRIL ) 20 MG tablet Take 20 mg by mouth daily.   oxybutynin  (DITROPAN ) 5 MG tablet Take 1 tablet (5 mg total) by mouth every 8 (eight) hours as needed for bladder spasms.   phenazopyridine  (PYRIDIUM ) 200 MG tablet Take 1 tablet (200 mg total) by mouth 3 (three) times daily as needed (for pain with urination).   No current facility-administered medications for this visit. (Other)   REVIEW OF SYSTEMS: ROS   Positive for: Cardiovascular, Eyes Negative for: Constitutional, Gastrointestinal, Neurological, Skin, Genitourinary, Musculoskeletal, HENT, Endocrine, Respiratory, Psychiatric, Allergic/Imm, Heme/Lymph Last edited by Resa Delon ORN, COT on 08/27/2024  8:57 AM.      ALLERGIES Allergies  Allergen Reactions   Pollen Extract Itching    POLLEN EXTRACTS   PAST MEDICAL HISTORY Past Medical History:  Diagnosis Date   Abnormal colonoscopy 10/30/2019   Impression: - One 12 mm polyp in the transverse colon, removed with a cold snare. Resected and retrieved. One 2 mm polyp in the sigmoid colon, removed with a cold snare. Resected and retrieved. Diverticulosis in the ascending colon. Non-bleeding internal hemorrhoids. Return to GI PRN. Repeat in 3 years. May be amenable to hermorrhoid band ligation.     Benign neoplasm of ascending colon 01/28/2023   12/14/2022  Benign neoplasm of transverse colon 10/30/2019   Cervical dysplasia    CIN I (cervical intraepithelial neoplasia I)    Grade II hemorrhoids 01/28/2023   12/14/2022     High risk HPV infection    History of abnormal cervical Pap smear    12-28-21 lgsil HPV HR +   History of adenomatous polyp of colon    History of infection due to human papilloma virus (HPV) 11/20/2019   Present again 06/19/2020 Not present 03/11/2023      History of kidney stones    Hypertension    Hypertensive retinopathy    OU   Macular degeneration, right eye    Missed abortion 05/04/2023   05/16/2013     Polyp of sigmoid colon 10/30/2019   Polyp of transverse colon 10/30/2019   Retinal defect, 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   followed by dr Jazalyn Mondor;  pt takes  inspra  for excess fluid in retina,   Sciatic nerve injury    Seasonal allergies    Vaginal dysplasia    Wears glasses    Past Surgical History:  Procedure Laterality Date   CERVICAL CONIZATION W/BX N/A 06/30/2023   Procedure: CONIZATION CERVIX WITH BIOPSY, ENDOCERVICAL CURETTAGE;  Surgeon: Viktoria Comer SAUNDERS, MD;  Location: [facility];  Service: Gynecology;  Laterality: N/A;   CO2 LASER APPLICATION N/A 06/30/2023   Procedure: CO2 LASER TO THE VAGINA;  Surgeon: Viktoria Comer SAUNDERS, MD;  Location: [facility];  Service: Gynecology;  Laterality: N/A;   COLONOSCOPY  12/14/2022   CYSTOSCOPY W/ URETERAL STENT PLACEMENT Left 05/26/2024   Procedure: CYSTOSCOPY, WITH RETROGRADE PYELOGRAM AND URETERAL STENT INSERTION;  Surgeon: Devere Lonni Righter, MD;  Location[facility] OR;  Service: Urology;  Laterality: Left;   CYSTOSCOPY/URETEROSCOPY/HOLMIUM LASER/STENT PLACEMENT Left 06/13/2024   Procedure: CYSTOSCOPY/URETEROSCOPY/HOLMIUM LASER/STENT PLACEMENT;  Surgeon: Devere Lonni Righter, MD;  Location: [facility];  Service: Urology;  Laterality: Left;  CYSTOSCOPY/LEFT URETEROSCOPY/HOLMIUM LASER/STENT PLACEMENT   HYSTEROSCOPY WITH RESECTOSCOPE  11/25/2006   @[facility]  by dr jinny. winfred;   Resectoscopic myomectomy and polypectomy   INJECTION OF SILICONE OIL Right 09/15/2023   Procedure: INJECTION OF SILICONE OIL;  Surgeon: Valdemar Rogue, MD;  Location: [facility] OR;  Service: Ophthalmology;  Laterality: Right;   INJECTION OF SILICONE OIL Right 02/23/2024   Procedure: INJECTION, SILICONE OIL EXCHANGE;  Surgeon: Valdemar Rogue, MD;  Location: [facility] OR;  Service: Ophthalmology;  Laterality:  Right;   PARS PLANA VITRECTOMY Right 09/15/2023   Procedure: PARS PLANA VITRECTOMY WITH 25 GAUGE;  Surgeon: Valdemar Rogue, MD;  Location: [facility] OR;  Service: Ophthalmology;  Laterality: Right;   PARS PLANA VITRECTOMY W/ FOCAL ENDOLASER PHOTOCOAGULATION Right 02/23/2024   Procedure: 25 GAUGE PARS PLANA VITRECTOMY  WITH LENSECTOMY, MEMBRANE PEEL, AND ENDOLASER;  Surgeon: Valdemar Rogue, MD;  Location: [facility] OR;  Service: Ophthalmology;  Laterality: Right;   REMOVAL RETAINED LENS Right 02/23/2024   Procedure: REMOVAL, RETAINED LENS MATTER;  Surgeon: Valdemar Rogue, MD;  Location: [facility] OR;  Service: Ophthalmology;  Laterality: Right;   REPAIR OF COMPLEX TRACTION RETINAL DETACHMENT Right 02/23/2024   Procedure: REPAIR, RETINAL DETACHMENT, COMPLEX;  Surgeon: Valdemar Rogue, MD;  Location: [facility] OR;  Service: Ophthalmology;  Laterality: Right;   VAGINECTOMY, PARTIAL N/A 06/30/2023   Procedure: SMALL VAGINECTOMY, PARTIAL BIOSPIES;  Surgeon: Viktoria Comer SAUNDERS, MD;  Location: [facility];  Service: Gynecology;  Laterality: N/A;   FAMILY HISTORY Family History  Problem Relation Age of Onset   Hypertension Mother    Diabetes Father    Diabetes  Maternal Grandmother    Hypertension Maternal Grandmother    Colon cancer Neg Hx    Colon polyps Neg Hx    Esophageal cancer Neg Hx    Rectal cancer Neg Hx    Stomach cancer Neg Hx    SOCIAL HISTORY Social History   Tobacco Use   Smoking status: Never   Smokeless tobacco: Never  Vaping Use   Vaping status: Never Used  Substance Use Topics   Alcohol use: No    Alcohol/week: 0.0 standard drinks of alcohol   Drug use: Never       OPHTHALMIC EXAM: Base Eye Exam     Visual Acuity (Snellen - Linear)       Right Left   Dist [place]   -1   Dist cc HM 20/25   Dist ph cc NI NI    Correction: Glasses         Tonometry (Tonopen, 9:01 AM)       Right Left   Pressure 14 18         Pupils       Pupils Dark Light Shape React APD   Right PERRL 5 4  Irregular Minimal NR   Left PERRL 3 2 Round Minimal None         Visual Fields       Left Right    Full    Restrictions  Total superior nasal, inferior nasal deficiencies         Extraocular Movement       Right Left    Full, Ortho Full, Ortho         Neuro/Psych     Oriented x3: Yes   Mood/Affect: Normal         Dilation     Both eyes: 2.5% Phenylephrine  @ 9:01 AM           Slit Lamp and Fundus Exam     Slit Lamp Exam       Right Left   Lids/Lashes Dermatochalasis Dermatochalasis - upper lid   Conjunctiva/Sclera White and quiet temporally, nasal pinguecula, trace injection temporaly, 2-3+ inferiorly 1+ Injection, nasal Pinguecula   Cornea Nasal ptergyium extending 2.13mm onto nasal cornea -- less inflamed, peripheral haze, well healed cataract wound, trace Punctate epithelial erosions, 3+ [place] Trace Punctate epithelial erosions, trace fine endo pigment   Anterior Chamber Deep, silicone oil bubbles deep, narrow temporal angle, 1+pigment   Iris Irregular dilation, +PS temporally Round and dilated   Lens 3 piece sulcus IOL with mild nasal displacement and open PC 2+ Nuclear sclerosis, 2+ Cortical cataract   Anterior Vitreous post vitrectomy, good silicone oil fill, lens remnant gone Mild Vitreous syneresis         Fundus Exam       Right Left   Disc 3+pallor, Sharp rim Pink and Sharp   C/D Ratio 0.4 0.6   Macula flat under oil Flat, Good foveal reflex, No heme or edema   Vessels Attenuated, mild tortuosity; fibrosis improved Attenuated, Tortuous   Periphery 360 laser, flat under oil, relaxing retinotomy temporally, dense fibrosis temporal periphery improved Bullous schisis cavity IT periphery (0300-0430)--good laser changes surrounding, new outer retinal holes w/ +SRF extending posterior to laser--focal RD--good laser changes surrounding; smaller schisis cavity ST periphery from 0100-0200 -- good laser changes surrounding, no new RT/RD            Refraction     Wearing Rx       Sphere Cylinder Axis  Right +1.25 +1.25 112   Left +2.25 +1.00 101           IMAGING AND PROCEDURES  Imaging and Procedures for @TODAY @  OCT, Retina - OU - Both Eyes       Right Eye Quality was borderline. Central Foveal Thickness: 269. Progression has been stable. Findings include normal foveal contour, no IRF, no SRF, subretinal hyper-reflective material, epiretinal membrane, pigment epithelial detachment, outer retinal atrophy (Better image quality, retina stably reattached, diffuse atrophy, scattered cystic changes).   Left Eye Quality was good. Central Foveal Thickness: 304. Progression has been stable. Findings include normal foveal contour, no IRF, no SRF, vitreomacular adhesion (bullous schisis cavity IT periphery w/ SRF caught on widefield; focal pocket of SRF nasal to disc--recurrent).   Notes *Images captured and stored on drive  Diagnosis / Impression:  OD: Better image quality, retina stably reattached, diffuse atrophy, scattered cystic changes OS: bullous schisis cavity IT periphery w/ SRF caught on widefield; focal pocket of SRF nasal to disc--recurrent  Clinical management:  See below  Abbreviations: NFP - Normal foveal profile. CME - cystoid macular edema. PED - pigment epithelial detachment. IRF - intraretinal fluid. SRF - subretinal fluid. EZ - ellipsoid zone. ERM - epiretinal membrane. ORA - outer retinal atrophy. ORT - outer retinal tubulation. SRHM - subretinal hyper-reflective material            ASSESSMENT/PLAN:    ICD-10-CM   1. Ocular inflammation  H57.89 OCT, Retina - OU - Both Eyes    2. 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 Traction detachment of right retina  H33.41     4. 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 Exudative age-related macular degeneration of right eye with active choroidal neovascularization (HCC)  H35.3211     6. Bilateral  retinoschisis  H33.103     7. Left retinal detachment  H33.22     8. Retinal hole of left eye  H33.322     9. Essential hypertension  I10     10. Hypertensive retinopathy of both eyes  H35.033 OCT, Retina - OU - Both Eyes    11. Combined forms of age-related cataract of both eyes  H25.813     12. Pseudophakia  Z96.1     13. Retained lens matter of right eye  H59.021      1-3. Ocular inflammation and PVR retinal detachment OD - 10.16.24 pt started seeing new floaters and sparks in vision - pt developed progressively worsening vision and vit haze - VA OD HM - stable - pt was on PredForte q1h OD, Prolensa  QID OD, and 40 mg po Prednisone  daily - b-scan 12.04.24 shows significant mobile vit opacities, ?RD - s/p PPV/PFO/EL/FAX/SO OD, 12.05.2024 - undiluted vit samples obtained intra-op for gram stain and culture -- no organisms, but +WBCs - intra-op - clearance of vitreous haze showed extensive fibrosis and PVR tractional detachment -- silicon oil added at end of case - AC was shallow with angle closed -- but now deep s/p CEIOL - exam showed residual intraretinal fibrosis and persistent SRF - POM4 s/p PPV/lensectomy/membrane peel/relaxing retinotomy/silicon oil exchange OD, 05.15.2025             - doing well - lens material gone; good oil fill             - IOP okay at 14             - cont  PF BID OD                         stopped ketorolac  TID OD             use  PSO ung bedtime / PRN OD - when irritated and AT's are not helping             - post op drop and positioning instructions reviewed  - returned to work on Monday, March 19, 2024 -- no straining, no lifting more than 10-15lbs, no pushing or pulling - f/u 7 weeks, DFE, OCT  4,5. CSCR OD -- chronic  - pt reports decreased vision OD since December 2019  - reports significant stressors in life -- work  - denies steroid use - FA (09.30.20) shows focal hyperfluorescent leakage points nasal macula OD -- expansile dot  phenotype? - started 50 mg po eplerenone  since 09.30.20  -- pt self d/c in February, re-started in June 2021 - s/p IVA OD #1 (09.01.21), #2 (09.29.21), #3 (11.03.21), #4 (12.03.21)  -- IVA resistance ================ - s/p IVE OD #1 (01.20.22), #2 (02.18.22), #3 (04.05.22-sample), #4 (05.23.22), # 5 (06.27.22), #6 (07.28.22), #7 (08.26.22), #8 (09.23.22), #9 (10.21.22), #10 (11.18.22), #11 (12.16.22), #12 (01.20.23), #13 (03.02.23), #14 (04.07.23), #15 (05.12.23), #16 (06.09.23), #17 (07.14.23), #18 (08.08.23), #19 (09.08.23), #20 (10.06.23), #21 (11.06.23), #22 (12.04.23), #23 (01.08.24), #24 (02.12.24), #25 (03.18.24), #26 (04.22.24), #27 (05.20.24)  -- IVE resistance ================ - s/p IVV OD #1 (07.02.24), #2 (07.30.24), #3 (08.27.24), #4 (09.26.24), #5 (10.24.24)  - differential includes atypical exudative ARMD - IVV informed consent obtained and signed, 07.02.24  - stop po Eplerenone  50 mg daily for now  - monitor  6-8. Retinoschisis OU (OD > OS)  - bullous retinoschisis cavity IT quadrant OD-- 9299-9169 oclock  - bullous retinoschisis cavity IT quadrant OS -- 9669-9569 oclock - widefield Optos imaging (04.22.24) shows mild interval progression of schisis cavities' size and posterior extension in comparison to baseline images -- widefield Optos imaging (08.13.25) OS showed Bullous schisis cavity IT periphery, good laser changes w/ new Outer retinal holes w/ +SRF extending posterior to laser--focal RD; schisis cavity ST periphery - s/p laser retinopexy OS 08.12.24 -- good laser changes surrounding - s/p laser retinopexy OD (10.07.24) - new schisis cavity found on examination OD (10.28.24) - 1030 periphery -- no RT/RD on scleral depressed exam - s/p laser retinopexy OD to new schisis at 1030 on 10.28.24 - schisis cavity found on exam and imaging OS 08.14.25 -- ST periphery - s/p laser retinopexy OS on 08.14.25 -- room for supplemental laser - s/p supplemental laser retinopexy OS on  10.06.25 for reinforcement of focal RD/schisis OS - f/u 7 wks - DFE/OCT  9,10. Hypertensive retinopathy OU  - discussed importance of tight BP control  - monitor  11. Mixed Cataract OS - The symptoms of cataract, surgical options, and treatments and risks were discussed with patient. - discussed diagnosis and progression - not yet visually significant - monitor   12,13. Pseudophakia OD  - s/p CE/IOL OD (Dr. Cleopatra, 04.04.25)  - complicated PC rupture and retained lens material in vitreous  - s/p PPV/ lensectomy -- see above - 3-piece IOL with mild nasal displacement but appears stably scarred in place  - monitor  Ophthalmic Meds Ordered this visit:  Meds ordered this encounter  Medications   prednisoLONE  acetate (PRED FORTE ) 1 % ophthalmic suspension    Sig: Place 1 drop into the left eye 2 (  two) times daily.    Dispense:  15 mL    Refill:  3     Return in about 7 weeks (around 10/15/2024) for f/u ocular inflammation OD, DFE, OCT.  There are no Patient Instructions on file for this visit.  This document serves as a record of services personally performed by Redell JUDITHANN Hans, MD, PhD. It was created on their behalf by Avelina Pereyra, COA an ophthalmic technician. The creation of this record is the provider's dictation and/or activities during the visit.   Electronically signed by: Avelina GORMAN Pereyra, COT  09/02/24  3:05 PM   This document serves as a record of services personally performed by Redell JUDITHANN Hans, MD, PhD. It was created on their behalf by Wanda GEANNIE Keens, COT an ophthalmic technician. The creation of this record is the provider's dictation and/or activities during the visit.    Electronically signed by:  Wanda GEANNIE Keens, COT  09/02/24 3:05 PM  Redell JUDITHANN Hans, M.D., Ph.D. Diseases & Surgery of the Retina and Vitreous [facility]  I have reviewed the above documentation for accuracy and completeness, and I agree with the above. Redell JUDITHANN Hans, M.D., Ph.D. 09/02/24 3:05 PM    Abbreviations: M myopia (nearsighted); A astigmatism; H hyperopia (farsighted); P presbyopia; Mrx spectacle prescription;  CTL contact lenses; OD right eye; OS left eye; OU both eyes  XT exotropia; ET esotropia; PEK punctate epithelial keratitis; PEE punctate epithelial erosions; DES dry eye syndrome; MGD meibomian gland dysfunction; ATs artificial tears; PFAT's preservative free artificial tears; NSC nuclear sclerotic cataract; PSC posterior subcapsular cataract; ERM epi-retinal membrane; PVD posterior vitreous detachment; RD retinal detachment; DM diabetes mellitus; DR diabetic retinopathy; NPDR non-proliferative diabetic retinopathy; PDR proliferative diabetic retinopathy; CSME clinically significant macular edema; DME diabetic macular edema; dbh dot blot hemorrhages; CWS cotton wool spot; POAG primary open angle glaucoma; C/D cup-to-disc ratio; HVF humphrey visual field; GVF goldmann visual field; OCT optical coherence tomography; IOP intraocular pressure; BRVO Branch retinal vein occlusion; CRVO central retinal vein occlusion; CRAO central retinal artery occlusion; BRAO branch retinal artery occlusion; RT retinal tear; SB scleral buckle; PPV pars plana vitrectomy; VH Vitreous hemorrhage; PRP panretinal laser photocoagulation; IVK intravitreal kenalog ; VMT vitreomacular traction; MH Macular hole;  NVD neovascularization of the disc; NVE neovascularization elsewhere; AREDS age related eye disease study; ARMD age related macular degeneration; POAG primary open angle glaucoma; EBMD epithelial/anterior basement membrane dystrophy; ACIOL anterior chamber intraocular lens; IOL intraocular lens; PCIOL posterior chamber intraocular lens; Phaco/IOL phacoemulsification with intraocular lens placement; PRK photorefractive keratectomy; LASIK laser assisted in situ keratomileusis; HTN hypertension; DM diabetes mellitus; COPD chronic obstructive pulmonary disease

## 2024-08-27 ENCOUNTER — Ambulatory Visit (INDEPENDENT_AMBULATORY_CARE_PROVIDER_SITE_OTHER): Admitting: Ophthalmology

## 2024-08-27 ENCOUNTER — Encounter (INDEPENDENT_AMBULATORY_CARE_PROVIDER_SITE_OTHER): Payer: Self-pay | Admitting: Ophthalmology

## 2024-08-27 DIAGNOSIS — H35711 Central serous chorioretinopathy, right eye: Secondary | ICD-10-CM

## 2024-08-27 DIAGNOSIS — H33103 Unspecified retinoschisis, bilateral: Secondary | ICD-10-CM

## 2024-08-27 DIAGNOSIS — H33322 Round hole, left eye: Secondary | ICD-10-CM

## 2024-08-27 DIAGNOSIS — H5789 Other specified disorders of eye and adnexa: Secondary | ICD-10-CM

## 2024-08-27 DIAGNOSIS — I10 Essential (primary) hypertension: Secondary | ICD-10-CM | POA: Diagnosis not present

## 2024-08-27 DIAGNOSIS — H3341 Traction detachment of retina, right eye: Secondary | ICD-10-CM

## 2024-08-27 DIAGNOSIS — Z961 Presence of intraocular lens: Secondary | ICD-10-CM

## 2024-08-27 DIAGNOSIS — H59021 Cataract (lens) fragments in eye following cataract surgery, right eye: Secondary | ICD-10-CM

## 2024-08-27 DIAGNOSIS — H353211 Exudative age-related macular degeneration, right eye, with active choroidal neovascularization: Secondary | ICD-10-CM | POA: Diagnosis not present

## 2024-08-27 DIAGNOSIS — H25813 Combined forms of age-related cataract, bilateral: Secondary | ICD-10-CM

## 2024-08-27 DIAGNOSIS — H35033 Hypertensive retinopathy, bilateral: Secondary | ICD-10-CM | POA: Diagnosis not present

## 2024-08-27 DIAGNOSIS — H3322 Serous retinal detachment, left eye: Secondary | ICD-10-CM

## 2024-08-27 DIAGNOSIS — H3521 Other non-diabetic proliferative retinopathy, right eye: Secondary | ICD-10-CM

## 2024-08-27 MED ORDER — PREDNISOLONE ACETATE 1 % OP SUSP: 1.0000 [drp] | Freq: Two times a day (BID) | OPHTHALMIC | 3 refills | Status: DC

## 2024-09-02 ENCOUNTER — Encounter (INDEPENDENT_AMBULATORY_CARE_PROVIDER_SITE_OTHER): Payer: Self-pay | Admitting: Ophthalmology

## 2024-09-12 ENCOUNTER — Encounter: Payer: Self-pay | Admitting: Obstetrics and Gynecology

## 2024-09-12 ENCOUNTER — Ambulatory Visit: Admitting: Obstetrics and Gynecology

## 2024-09-12 VITALS — BP 126/82 | HR 83

## 2024-09-12 DIAGNOSIS — Z87410 Personal history of cervical dysplasia: Secondary | ICD-10-CM

## 2024-09-12 DIAGNOSIS — Z87411 Personal history of vaginal dysplasia: Secondary | ICD-10-CM | POA: Diagnosis not present

## 2024-09-12 DIAGNOSIS — L659 Nonscarring hair loss, unspecified: Secondary | ICD-10-CM

## 2024-09-12 DIAGNOSIS — N951 Menopausal and female climacteric states: Secondary | ICD-10-CM | POA: Diagnosis not present

## 2024-09-12 NOTE — Patient Instructions (Signed)
 Consider Estroven as an over the counter option to treat menopause symptoms.

## 2024-09-12 NOTE — Progress Notes (Signed)
 GYNECOLOGY  VISIT   HPI: 53 y.o.   Single  Hispanic female   G1P0010 with Patient's last menstrual period was 02/18/2022.   here for: 6 month follow up - hx abnormal pap, hx vaginal dysplasia, hx cervical dysplasia.    Interpretor is present for the entire visit today.   No bleeding, discharge, or pelvic pain.     Having hot flashes and night sweats. Some hair loss.   Wants thyroid  check.    Had surgery for renal stone in September.      GYNECOLOGIC HISTORY: Patient's last menstrual period was 02/18/2022. Contraception:  Condoms  Menopausal hormone therapy:  n/a Last 2 paps:  03/12/24 neg HR HPV neg, 03/11/23 LSIL, HR HPV + History of abnormal Pap or positive HPV:  yes Mammogram:  02/03/24 Breast Density Cat B, BIRADS Cat 1 neg         OB History     Gravida  1   Para  0   Term      Preterm      AB  1   Living  0      SAB  1   IAB      Ectopic      Multiple      Live Births                 Patient Active Problem List   Diagnosis Date Noted   Left renal stone 05/26/2024   Obstructive uropathy 05/26/2024   Complicated UTI (urinary tract infection) 05/26/2024   Cervical dysplasia 06/30/2023   Vaginal dysplasia 06/30/2023   Cataract 05/15/2021   Exudative age-related macular degeneration (HCC) 05/15/2021   Dyslipidemia 09/28/2019   Elevated platelet count (HCC) 07/31/2015   Elevated LFTs 07/31/2015   Menstrual migraine without status migrainosus, not intractable 06/27/2015   CIN I (cervical intraepithelial neoplasia I) 06/16/2012   History of sciatica 06/16/2012   Depression 06/16/2012   Anxiety 06/16/2012   Hypertension 06/16/2011   Fibroid uterus 06/16/2011    Past Medical History:  Diagnosis Date   Abnormal colonoscopy 10/30/2019   Impression: - One 12 mm polyp in the transverse colon, removed with a cold snare. Resected and retrieved. One 2 mm polyp in the sigmoid colon, removed with a cold snare. Resected and retrieved. Diverticulosis  in the ascending colon. Non-bleeding internal hemorrhoids. Return to GI PRN. Repeat in 3 years. May be amenable to hermorrhoid band ligation.     Benign neoplasm of ascending colon 01/28/2023   12/14/2022     Benign neoplasm of transverse colon 10/30/2019   Cervical dysplasia    CIN I (cervical intraepithelial neoplasia I)    Grade II hemorrhoids 01/28/2023   12/14/2022     High risk HPV infection    History of abnormal cervical Pap smear    12-28-21 lgsil HPV HR +   History of adenomatous polyp of colon    History of infection due to human papilloma virus (HPV) 11/20/2019   Present again 06/19/2020 Not present 03/11/2023     History of kidney stones    Hypertension    Hypertensive retinopathy    OU   Macular degeneration, right eye    Missed abortion 05/04/2023   05/16/2013     Polyp of sigmoid colon 10/30/2019   Polyp of transverse colon 10/30/2019   Retinal defect, 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   followed by dr zamora;  pt takes  inspra  for excess fluid in retina,   Sciatic nerve injury  Seasonal allergies    Vaginal dysplasia    Wears glasses     Past Surgical History:  Procedure Laterality Date   CERVICAL CONIZATION W/BX N/A 06/30/2023   Procedure: CONIZATION CERVIX WITH BIOPSY, ENDOCERVICAL CURETTAGE;  Surgeon: Viktoria Comer SAUNDERS, MD;  Location: [facility];  Service: Gynecology;  Laterality: N/A;   CO2 LASER APPLICATION N/A 06/30/2023   Procedure: CO2 LASER TO THE VAGINA;  Surgeon: Viktoria Comer SAUNDERS, MD;  Location: [facility];  Service: Gynecology;  Laterality: N/A;   COLONOSCOPY  12/14/2022   CYSTOSCOPY W/ URETERAL STENT PLACEMENT Left 05/26/2024   Procedure: CYSTOSCOPY, WITH RETROGRADE PYELOGRAM AND URETERAL STENT INSERTION;  Surgeon: Devere Lonni Righter, MD;  Location: [facility] OR;  Service: Urology;  Laterality: Left;   CYSTOSCOPY/URETEROSCOPY/HOLMIUM LASER/STENT PLACEMENT Left 06/13/2024   Procedure: CYSTOSCOPY/URETEROSCOPY/HOLMIUM LASER/STENT  PLACEMENT;  Surgeon: Devere Lonni Righter, MD;  Location: [facility];  Service: Urology;  Laterality: Left;  CYSTOSCOPY/LEFT URETEROSCOPY/HOLMIUM LASER/STENT PLACEMENT   HYSTEROSCOPY WITH RESECTOSCOPE  11/25/2006   @[facility]  by dr jinny. winfred;   Resectoscopic myomectomy and polypectomy   INJECTION OF SILICONE OIL Right 09/15/2023   Procedure: INJECTION OF SILICONE OIL;  Surgeon: Valdemar Rogue, MD;  Location: [facility] OR;  Service: Ophthalmology;  Laterality: Right;   INJECTION OF SILICONE OIL Right 02/23/2024   Procedure: INJECTION, SILICONE OIL EXCHANGE;  Surgeon: Valdemar Rogue, MD;  Location: [facility] OR;  Service: Ophthalmology;  Laterality: Right;   PARS PLANA VITRECTOMY Right 09/15/2023   Procedure: PARS PLANA VITRECTOMY WITH 25 GAUGE;  Surgeon: Valdemar Rogue, MD;  Location: [facility] OR;  Service: Ophthalmology;  Laterality: Right;   PARS PLANA VITRECTOMY W/ FOCAL ENDOLASER PHOTOCOAGULATION Right 02/23/2024   Procedure: 25 GAUGE PARS PLANA VITRECTOMY  WITH LENSECTOMY, MEMBRANE PEEL, AND ENDOLASER;  Surgeon: Valdemar Rogue, MD;  Location: [facility] OR;  Service: Ophthalmology;  Laterality: Right;   REMOVAL RETAINED LENS Right 02/23/2024   Procedure: REMOVAL, RETAINED LENS MATTER;  Surgeon: Valdemar Rogue, MD;  Location: [facility] OR;  Service: Ophthalmology;  Laterality: Right;   REPAIR OF COMPLEX TRACTION RETINAL DETACHMENT Right 02/23/2024   Procedure: REPAIR, RETINAL DETACHMENT, COMPLEX;  Surgeon: Valdemar Rogue, MD;  Location: [facility] OR;  Service: Ophthalmology;  Laterality: Right;   VAGINECTOMY, PARTIAL N/A 06/30/2023   Procedure: SMALL VAGINECTOMY, PARTIAL BIOSPIES;  Surgeon: Viktoria Comer SAUNDERS, MD;  Location: [facility];  Service: Gynecology;  Laterality: N/A;    Current Outpatient Medications  Medication Sig Dispense Refill   hydrochlorothiazide  (HYDRODIURIL ) 25 MG tablet Take 25 mg by mouth daily.     lisinopril  (ZESTRIL ) 20 MG tablet Take 20 mg by mouth daily.     prednisoLONE  acetate (PRED FORTE ) 1 %  ophthalmic suspension Place 1 drop into the left eye 2 (two) times daily. 15 mL 3   HYDROcodone -acetaminophen  (NORCO/VICODIN) 5-325 MG tablet Take 1-2 tablets by mouth every 6 (six) hours as needed for moderate pain (pain score 4-6) or severe pain (pain score 7-10). (Patient not taking: Reported on 09/12/2024) 20 tablet 0   oxybutynin  (DITROPAN ) 5 MG tablet Take 1 tablet (5 mg total) by mouth every 8 (eight) hours as needed for bladder spasms. (Patient not taking: Reported on 09/12/2024) 30 tablet 1   phenazopyridine  (PYRIDIUM ) 200 MG tablet Take 1 tablet (200 mg total) by mouth 3 (three) times daily as needed (for pain with urination). (Patient not taking: Reported on 09/12/2024) 30 tablet 0   No current facility-administered medications for this visit.     ALLERGIES: Pollen extract  Family  History  Problem Relation Age of Onset   Hypertension Mother    Diabetes Father    Diabetes Maternal Grandmother    Hypertension Maternal Grandmother    Colon cancer Neg Hx    Colon polyps Neg Hx    Esophageal cancer Neg Hx    Rectal cancer Neg Hx    Stomach cancer Neg Hx     Social History   Socioeconomic History   Marital status: Single    Spouse name: Not on file   Number of children: Not on file   Years of education: Not on file   Highest education level: Not on file  Occupational History   Occupation: merchandiser, retail in a hotel  Tobacco Use   Smoking status: Never   Smokeless tobacco: Never  Vaping Use   Vaping status: Never Used  Substance and Sexual Activity   Alcohol use: No    Alcohol/week: 0.0 standard drinks of alcohol   Drug use: Never   Sexual activity: Not Currently    Partners: Male    Birth control/protection: Condom, Post-menopausal  Other Topics Concern   Not on file  Social History Narrative   Not on file   Social Drivers of Health   Financial Resource Strain: Low Risk  (08/15/2024)   Received from [facility]   Overall Financial Resource Strain (CARDIA)    How  hard is it for you to pay for the very basics like food, housing, medical care, and heating?: Not very hard  Food Insecurity: No Food Insecurity (08/15/2024)   Received from [facility]   Hunger Vital Sign    Within the past 12 months, you worried that your food would run out before you got the money to buy more.: Never true    Within the past 12 months, the food you bought just didn't last and you didn't have money to get more.: Never true  Transportation Needs: No Transportation Needs (08/15/2024)   Received from [facility] - Transportation    In the past 12 months, has lack of transportation kept you from meetings, work, or from getting things needed for daily living?: No    In the past 12 months, has lack of transportation kept you from medical appointments or from getting medications?: No  Physical Activity: Not on file  Stress: Not on file  Social Connections: Unknown (05/26/2024)   Social Connection and Isolation Panel    Frequency of Communication with Friends and Family: More than three times a week    Frequency of Social Gatherings with Friends and Family: Patient declined    Attends Religious Services: Patient unable to answer    Active Member of Clubs or Organizations: No    Attends Banker Meetings: Patient declined    Marital Status: Patient unable to answer  Intimate Partner Violence: Not At Risk (05/26/2024)   Humiliation, Afraid, Rape, and Kick questionnaire    Fear of Current or Ex-Partner: No    Emotionally Abused: No    Physically Abused: No    Sexually Abused: No    Review of Systems  All other systems reviewed and are negative.   PHYSICAL EXAMINATION:   BP 126/82 (BP Location: Left Arm, Patient Position: Sitting)   Pulse 83   LMP 02/18/2022 Comment: sexually active, condoms  SpO2 98%     General appearance: alert, cooperative and appears stated age   Pelvic: External genitalia:  no lesions  Urethra:  normal appearing  urethra with no masses, tenderness or lesions              Bartholins and Skenes: normal                 Vagina: normal appearing vagina with normal color and discharge, no lesions              Cervix: no lesions                Bimanual Exam:  Uterus:  normal size, contour, position, consistency, mobility, non-tender              Adnexa: no mass, fullness, tenderness  Pelvic exam uncomfortable for patient.          Chaperone was present for exam:  Kari HERO, CMA  ASSESSMENT:  Status post cervical conization 06/30/23 with Dr. Viktoria.  Final pathology:  LGSIL. Status post excision and laser of VAIN I 06/30/23 with Dr. Viktoria.  No evidence of vaginal or cervical recurrence. Pap normal and negative HR HPV on 03/12/24.  Menopausal symptoms.  Atrophy. Hair loss.    PLAN:  Continue visits every 6 months for 2 years post op.   Annual exam with pap and HR HPV testing in June, 2025.  Patient will try Estroven for her menopausal vasomotor symptoms.  She declines vaginal estrogen treatment for atrophy.  Check TSH.    25 min  total time was spent for this patient encounter, including preparation, face-to-face counseling with the patient, coordination of care, and documentation of the encounter.

## 2024-09-13 ENCOUNTER — Ambulatory Visit: Payer: Self-pay | Admitting: Obstetrics and Gynecology

## 2024-09-13 LAB — TSH: TSH: 0.87 m[IU]/L

## 2024-10-01 NOTE — Progress Notes (Signed)
 " [facility] - Clinic Note  10/15/2024     CHIEF COMPLAINT Patient presents for Retina Follow Up   HISTORY OF PRESENT ILLNESS: Lynnley Doddridge is a 53 y.o. female who presents to the clinic today for:  HPI     Retina Follow Up   This started 6 weeks ago.  Duration of 6 weeks.  Since onset it is stable.  I, the attending physician,  performed the HPI with the patient and updated documentation appropriately.        Comments   Pt states she is doing pretty well. Pt has sensitivity to sunlight and her eye is straining when she is very busy trying to get things done quickly. Pt has been consistent with PF OD BID. Pt denies FOL/floaters/pain.      Last edited by Valdemar Rogue, MD on 10/18/2024  6:13 PM.     Patient feels that the sun light is bothering her when she drives.  Referring physician: Mason Haywood LABOR, PA-C [address] [place],  [place] [address]  HISTORICAL INFORMATION:   Selected notes from the MEDICAL RECORD NUMBER Referred by Dr. Devere Kitty for concern of sub-macular fluid / retinoschisis   CURRENT MEDICATIONS: Current Outpatient Medications (Ophthalmic Drugs)  Medication Sig   prednisoLONE  acetate (PRED FORTE ) 1 % ophthalmic suspension Place 1 drop into the left eye 2 (two) times daily.   No current facility-administered medications for this visit. (Ophthalmic Drugs)   Current Outpatient Medications (Other)  Medication Sig   hydrochlorothiazide  (HYDRODIURIL ) 25 MG tablet Take 25 mg by mouth daily.   HYDROcodone -acetaminophen  (NORCO/VICODIN) 5-325 MG tablet Take 1-2 tablets by mouth every 6 (six) hours as needed for moderate pain (pain score 4-6) or severe pain (pain score 7-10). (Patient not taking: Reported on 09/12/2024)   lisinopril  (ZESTRIL ) 20 MG tablet Take 20 mg by mouth daily.   oxybutynin  (DITROPAN ) 5 MG tablet Take 1 tablet (5 mg total) by mouth every 8 (eight) hours as needed for bladder spasms. (Patient  not taking: Reported on 09/12/2024)   phenazopyridine  (PYRIDIUM ) 200 MG tablet Take 1 tablet (200 mg total) by mouth 3 (three) times daily as needed (for pain with urination). (Patient not taking: Reported on 09/12/2024)   No current facility-administered medications for this visit. (Other)   REVIEW OF SYSTEMS: ROS   Positive for: Cardiovascular, Eyes Negative for: Constitutional, Gastrointestinal, Neurological, Skin, Genitourinary, Musculoskeletal, HENT, Endocrine, Respiratory, Psychiatric, Allergic/Imm, Heme/Lymph Last edited by Elnor Avelina RAMAN, COT on 10/15/2024  8:27 AM.       ALLERGIES Allergies  Allergen Reactions   Pollen Extract Itching    POLLEN EXTRACTS   PAST MEDICAL HISTORY Past Medical History:  Diagnosis Date   Abnormal colonoscopy 10/30/2019   Impression: - One 12 mm polyp in the transverse colon, removed with a cold snare. Resected and retrieved. One 2 mm polyp in the sigmoid colon, removed with a cold snare. Resected and retrieved. Diverticulosis in the ascending colon. Non-bleeding internal hemorrhoids. Return to GI PRN. Repeat in 3 years. May be amenable to hermorrhoid band ligation.     Benign neoplasm of ascending colon 01/28/2023   12/14/2022     Benign neoplasm of transverse colon 10/30/2019   Cervical dysplasia    CIN I (cervical intraepithelial neoplasia I)    Grade II hemorrhoids 01/28/2023   12/14/2022     High risk HPV infection    History of abnormal cervical Pap smear    12-28-21 lgsil HPV  HR +   History of adenomatous polyp of colon    History of infection due to human papilloma virus (HPV) 11/20/2019   Present again 06/19/2020 Not present 03/11/2023     History of kidney stones    Hypertension    Hypertensive retinopathy    OU   Macular degeneration, right eye    Missed abortion 05/04/2023   05/16/2013     Polyp of sigmoid colon 10/30/2019   Polyp of transverse colon 10/30/2019   Retinal defect, 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   followed by dr Zinia Innocent;   pt takes  inspra  for excess fluid in retina,   Sciatic nerve injury    Seasonal allergies    Vaginal dysplasia    Wears glasses    Past Surgical History:  Procedure Laterality Date   CERVICAL CONIZATION W/BX N/A 06/30/2023   Procedure: CONIZATION CERVIX WITH BIOPSY, ENDOCERVICAL CURETTAGE;  Surgeon: Viktoria Comer SAUNDERS, MD;  Location: [facility];  Service: Gynecology;  Laterality: N/A;   CO2 LASER APPLICATION N/A 06/30/2023   Procedure: CO2 LASER TO THE VAGINA;  Surgeon: Viktoria Comer SAUNDERS, MD;  Location: [facility];  Service: Gynecology;  Laterality: N/A;   COLONOSCOPY  12/14/2022   CYSTOSCOPY W/ URETERAL STENT PLACEMENT Left 05/26/2024   Procedure: CYSTOSCOPY, WITH RETROGRADE PYELOGRAM AND URETERAL STENT INSERTION;  Surgeon: Devere Lonni Righter, MD;  Location: [facility] OR;  Service: Urology;  Laterality: Left;   CYSTOSCOPY/URETEROSCOPY/HOLMIUM LASER/STENT PLACEMENT Left 06/13/2024   Procedure: CYSTOSCOPY/URETEROSCOPY/HOLMIUM LASER/STENT PLACEMENT;  Surgeon: Devere Lonni Righter, MD;  Location: [facility];  Service: Urology;  Laterality: Left;  CYSTOSCOPY/LEFT URETEROSCOPY/HOLMIUM LASER/STENT PLACEMENT   HYSTEROSCOPY WITH RESECTOSCOPE  11/25/2006   @[facility]  by dr jinny. winfred;   Resectoscopic myomectomy and polypectomy   INJECTION OF SILICONE OIL Right 09/15/2023   Procedure: INJECTION OF SILICONE OIL;  Surgeon: Valdemar Rogue, MD;  Location: [facility] OR;  Service: Ophthalmology;  Laterality: Right;   INJECTION OF SILICONE OIL Right 02/23/2024   Procedure: INJECTION, SILICONE OIL EXCHANGE;  Surgeon: Valdemar Rogue, MD;  Location: [facility] OR;  Service: Ophthalmology;  Laterality: Right;   PARS PLANA VITRECTOMY Right 09/15/2023   Procedure: PARS PLANA VITRECTOMY WITH 25 GAUGE;  Surgeon: Valdemar Rogue, MD;  Location: [facility] OR;  Service: Ophthalmology;  Laterality: Right;   PARS PLANA VITRECTOMY W/ FOCAL ENDOLASER PHOTOCOAGULATION Right 02/23/2024   Procedure: 25 GAUGE PARS PLANA  VITRECTOMY  WITH LENSECTOMY, MEMBRANE PEEL, AND ENDOLASER;  Surgeon: Valdemar Rogue, MD;  Location: [facility] OR;  Service: Ophthalmology;  Laterality: Right;   REMOVAL RETAINED LENS Right 02/23/2024   Procedure: REMOVAL, RETAINED LENS MATTER;  Surgeon: Valdemar Rogue, MD;  Location: [facility] OR;  Service: Ophthalmology;  Laterality: Right;   REPAIR OF COMPLEX TRACTION RETINAL DETACHMENT Right 02/23/2024   Procedure: REPAIR, RETINAL DETACHMENT, COMPLEX;  Surgeon: Valdemar Rogue, MD;  Location: [facility] OR;  Service: Ophthalmology;  Laterality: Right;   VAGINECTOMY, PARTIAL N/A 06/30/2023   Procedure: SMALL VAGINECTOMY, PARTIAL BIOSPIES;  Surgeon: Viktoria Comer SAUNDERS, MD;  Location: [facility];  Service: Gynecology;  Laterality: N/A;   FAMILY HISTORY Family History  Problem Relation Age of Onset   Hypertension Mother    Diabetes Father    Diabetes Maternal Grandmother    Hypertension Maternal Grandmother    Colon cancer Neg Hx    Colon polyps Neg Hx    Esophageal cancer Neg Hx    Rectal cancer Neg Hx    Stomach cancer Neg Hx    SOCIAL HISTORY Social History  Tobacco Use   Smoking status: Never   Smokeless tobacco: Never  Vaping Use   Vaping status: Never Used  Substance Use Topics   Alcohol use: No    Alcohol/week: 0.0 standard drinks of alcohol   Drug use: Never       OPHTHALMIC EXAM: Base Eye Exam     Visual Acuity (Snellen - Linear)       Right Left   Dist cc CF @1 ' 20/30   Dist ph cc NI NI         Tonometry (Tonopen, 8:32 AM)       Right Left   Pressure 23 23         Pupils       Dark Light Shape React APD   Right 6 6 Irregular NR None   Left 4 3 Round Brisk NR         Visual Fields       Left Right    Full    Restrictions  Partial outer superior nasal, inferior nasal deficiencies         Extraocular Movement       Right Left    Full, Ortho Full, Ortho         Neuro/Psych     Oriented x3: Yes   Mood/Affect: Normal         Dilation      Both eyes: 1.0% Mydriacyl , 2.5% Phenylephrine  @ 8:34 AM           Slit Lamp and Fundus Exam     Slit Lamp Exam       Right Left   Lids/Lashes Dermatochalasis Dermatochalasis - upper lid   Conjunctiva/Sclera White and quiet temporally, nasal pinguecula, 2-3+ inferiorly nasal Pinguecula   Cornea Nasal ptergyium extending 2.37mm onto nasal cornea -- less inflamed, peripheral haze- slightly increased, well healed cataract wound, trace Punctate epithelial erosions, 3+ Guttata, endo pigment superiorly Trace Punctate epithelial erosions, trace fine endo pigment   Anterior Chamber Deep, silicone oil bubbles deep, narrow temporal angle, 1+pigment   Iris Irregular dilation, +PS temporally Round and dilated   Lens 3 piece sulcus IOL with mild nasal displacement and open PC 2+ Nuclear sclerosis, 2+ Cortical cataract   Anterior Vitreous post vitrectomy, good silicone oil fill Mild Vitreous syneresis         Fundus Exam       Right Left   Disc 3+pallor, Sharp rim Pink and Sharp   C/D Ratio 0.4 0.6   Macula flat under oil Flat, Good foveal reflex, No heme or edema   Vessels Attenuated, mild tortuosity; fibrosis improved Attenuated, Tortuous   Periphery 360 laser, flat under oil, relaxing retinotomy temporally, dense fibrosis temporal periphery improved Bullous schisis cavity IT periphery (0300-0430)--good laser changes surrounding, new outer retinal holes w/ +SRF extending posterior to laser--focal RD--good laser changes surrounding; smaller schisis cavity ST periphery from 0100-0200 -- good laser changes surrounding, no new RT/RD           Refraction     Wearing Rx       Sphere Cylinder Axis   Right +1.25 +1.25 112   Left +2.25 +1.00 101           IMAGING AND PROCEDURES  Imaging and Procedures for @TODAY @  OCT, Retina - OU - Both Eyes       Right Eye Quality was poor. Central Foveal Thickness: 206. Progression has been stable. Findings include normal foveal contour, no  IRF, no SRF, subretinal  hyper-reflective material, epiretinal membrane, pigment epithelial detachment, outer retinal atrophy (Poor image quality, retina stably reattached, diffuse atrophy, scattered cystic changes).   Left Eye Quality was good. Central Foveal Thickness: 306. Progression has been stable. Findings include normal foveal contour, no IRF, no SRF, vitreomacular adhesion (bullous schisis cavity IT periphery w/ SRF caught on widefield- not imaged today; focal pocket of SRF nasal to disc--slightly improved).   Notes *Images captured and stored on drive  Diagnosis / Impression:  OD: Better image quality, retina stably reattached, diffuse atrophy, scattered cystic changes OS: bullous schisis cavity IT periphery w/ SRF caught on widefield- not imaged today; focal pocket of SRF nasal to disc--slightly improved  Clinical management:  See below  Abbreviations: NFP - Normal foveal profile. CME - cystoid macular edema. PED - pigment epithelial detachment. IRF - intraretinal fluid. SRF - subretinal fluid. EZ - ellipsoid zone. ERM - epiretinal membrane. ORA - outer retinal atrophy. ORT - outer retinal tubulation. SRHM - subretinal hyper-reflective material             ASSESSMENT/PLAN:    ICD-10-CM   1. Ocular inflammation  H57.89 OCT, Retina - OU - Both Eyes    2. 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 Traction detachment of right retina  H33.41     4. 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 Left retinal detachment  H33.22     8. Retinal hole of left eye  H33.322     9. Essential hypertension  I10     10. Hypertensive retinopathy of both eyes  H35.033     11. Combined forms of age-related cataract of both eyes  H25.813     12. Pseudophakia   Z96.1     13. Retained lens matter of right eye  H59.021      1-3. Ocular inflammation and PVR retinal detachment OD - 10.16.24 pt started seeing new floaters and sparks in vision - pt developed progressively worsening vision and vit haze - VA OD HM - stable - pt was on PredForte q1h OD, Prolensa  QID OD, and 40 mg po Prednisone  daily - b-scan 12.04.24 shows significant mobile vit opacities, ?RD - s/p PPV/PFO/EL/FAX/SO OD, 12.05.2024 - undiluted vit samples obtained intra-op for gram stain and culture -- no organisms, but +WBCs - intra-op - clearance of vitreous haze showed extensive fibrosis and PVR tractional detachment -- silicon oil added at end of case - AC was shallow with angle closed -- but now deep s/p CEIOL - exam showed residual intraretinal fibrosis and persistent SRF - s/p PPV/lensectomy/membrane peel/relaxing retinotomy/silicon oil exchange OD, 05.15.2025             - lens material gone; good oil fill             - IOP okay at 23  - some keratopathy from silicon oil in AC - monitor             - cont   PF BID OD                         stopped ketorolac  TID OD             use PSO ung bedtime /  PRN OD - when irritated and AT's are not helping             - post op drop and positioning instructions reviewed  - returned to work on Monday, March 19, 2024 -- no straining, no lifting more than 10-15lbs, no pushing or pulling - f/u 2-3 months, DFE, OCT  4,5. CSCR OD -- chronic  - pt reports decreased vision OD since December 2019  - reports significant stressors in life -- work  - denies steroid use - FA (09.30.20) shows focal hyperfluorescent leakage points nasal macula OD -- expansile dot phenotype? - started 50 mg po eplerenone  since 09.30.20  -- pt self d/c in February, re-started in June 2021 - s/p IVA OD #1 (09.01.21), #2 (09.29.21), #3 (11.03.21), #4 (12.03.21)  -- IVA resistance ================ - s/p IVE OD #1 (01.20.22), #2 (02.18.22), #3 (04.05.22-sample), #4  (05.23.22), # 5 (06.27.22), #6 (07.28.22), #7 (08.26.22), #8 (09.23.22), #9 (10.21.22), #10 (11.18.22), #11 (12.16.22), #12 (01.20.23), #13 (03.02.23), #14 (04.07.23), #15 (05.12.23), #16 (06.09.23), #17 (07.14.23), #18 (08.08.23), #19 (09.08.23), #20 (10.06.23), #21 (11.06.23), #22 (12.04.23), #23 (01.08.24), #24 (02.12.24), #25 (03.18.24), #26 (04.22.24), #27 (05.20.24)  -- IVE resistance ================ - s/p IVV OD #1 (07.02.24), #2 (07.30.24), #3 (08.27.24), #4 (09.26.24), #5 (10.24.24)  - differential includes atypical exudative ARMD - IVV informed consent obtained and signed, 07.02.24  - stop po Eplerenone  50 mg daily for now  - monitor  6-8. Retinoschisis OU (OD > OS)  - bullous retinoschisis cavity IT quadrant OD-- 9299-9169 oclock  - bullous retinoschisis cavity IT quadrant OS -- 9669-9569 oclock - widefield Optos imaging (04.22.24) shows mild interval progression of schisis cavities' size and posterior extension in comparison to baseline images -- widefield Optos imaging (08.13.25) OS showed Bullous schisis cavity IT periphery, good laser changes w/ new Outer retinal holes w/ +SRF extending posterior to laser--focal RD; schisis cavity ST periphery - s/p laser retinopexy OS 08.12.24 -- good laser changes surrounding - s/p laser retinopexy OD (10.07.24) - new schisis cavity found on examination OD (10.28.24) - 1030 periphery -- no RT/RD on scleral depressed exam - s/p laser retinopexy OD to new schisis at 1030 on 10.28.24 - schisis cavity found on exam and imaging OS 08.14.25 -- ST periphery - s/p laser retinopexy OS on 08.14.25 -- room for supplemental laser - s/p supplemental laser retinopexy OS on 10.06.25 for reinforcement of focal RD/schisis OS - f/u 2-3 months - DFE/OCT  9,10. Hypertensive retinopathy OU  - discussed importance of tight BP control  - monitor  11. Mixed Cataract OS - The symptoms of cataract, surgical options, and treatments and risks were discussed with  patient. - discussed diagnosis and progression - not yet visually significant - monitor    12,13. Pseudophakia OD  - s/p CE/IOL OD (Dr. Cleopatra, 04.04.25)  - complicated PC rupture and retained lens material in vitreous  - s/p PPV/ lensectomy -- see above - 3-piece IOL with mild nasal displacement but appears stably scarred in place  - monitor  Ophthalmic Meds Ordered this visit:  Meds ordered this encounter  Medications   prednisoLONE  acetate (PRED FORTE ) 1 % ophthalmic suspension    Sig: Place 1 drop into the left eye 2 (two) times daily.    Dispense:  15 mL    Refill:  3     Return in about 3 months (around 01/13/2025) for f/u ocular inflammation , DFE, OCT.  There are no Patient Instructions on file for this visit.  This document  serves as a record of services personally performed by Redell JUDITHANN Hans, MD, PhD. It was created on their behalf by Avelina Pereyra, COA an ophthalmic technician. The creation of this record is the provider's dictation and/or activities during the visit.   Electronically signed by: Avelina GORMAN Pereyra, COT  10/18/2024  6:14 PM   This document serves as a record of services personally performed by Redell JUDITHANN Hans, MD, PhD. It was created on their behalf by Wanda GEANNIE Keens, COT an ophthalmic technician. The creation of this record is the provider's dictation and/or activities during the visit.    Electronically signed by:  Wanda GEANNIE Keens, COT  10/18/2024 6:14 PM  Redell JUDITHANN Hans, M.D., Ph.D. Diseases & Surgery of the Retina and Vitreous [facility]  I have reviewed the above documentation for accuracy and completeness, and I agree with the above. Redell JUDITHANN Hans, M.D., Ph.D. 10/18/2024 6:15 PM   Abbreviations: M myopia (nearsighted); A astigmatism; H hyperopia (farsighted); P presbyopia; Mrx spectacle prescription;  CTL contact lenses; OD right eye; OS left eye; OU both eyes  XT exotropia; ET esotropia; PEK punctate epithelial  keratitis; PEE punctate epithelial erosions; DES dry eye syndrome; MGD meibomian gland dysfunction; ATs artificial tears; PFAT's preservative free artificial tears; NSC nuclear sclerotic cataract; PSC posterior subcapsular cataract; ERM epi-retinal membrane; PVD posterior vitreous detachment; RD retinal detachment; DM diabetes mellitus; DR diabetic retinopathy; NPDR non-proliferative diabetic retinopathy; PDR proliferative diabetic retinopathy; CSME clinically significant macular edema; DME diabetic macular edema; dbh dot blot hemorrhages; CWS cotton wool spot; POAG primary open angle glaucoma; C/D cup-to-disc ratio; HVF humphrey visual field; GVF goldmann visual field; OCT optical coherence tomography; IOP intraocular pressure; BRVO Branch retinal vein occlusion; CRVO central retinal vein occlusion; CRAO central retinal artery occlusion; BRAO branch retinal artery occlusion; RT retinal tear; SB scleral buckle; PPV pars plana vitrectomy; VH Vitreous hemorrhage; PRP panretinal laser photocoagulation; IVK intravitreal kenalog ; VMT vitreomacular traction; MH Macular hole;  NVD neovascularization of the disc; NVE neovascularization elsewhere; AREDS age related eye disease study; ARMD age related macular degeneration; POAG primary open angle glaucoma; EBMD epithelial/anterior basement membrane dystrophy; ACIOL anterior chamber intraocular lens; IOL intraocular lens; PCIOL posterior chamber intraocular lens; Phaco/IOL phacoemulsification with intraocular lens placement; PRK photorefractive keratectomy; LASIK laser assisted in situ keratomileusis; HTN hypertension; DM diabetes mellitus; COPD chronic obstructive pulmonary disease "

## 2024-10-15 ENCOUNTER — Encounter (INDEPENDENT_AMBULATORY_CARE_PROVIDER_SITE_OTHER): Payer: Self-pay | Admitting: Ophthalmology

## 2024-10-15 ENCOUNTER — Ambulatory Visit (INDEPENDENT_AMBULATORY_CARE_PROVIDER_SITE_OTHER): Admitting: Ophthalmology

## 2024-10-15 DIAGNOSIS — H3341 Traction detachment of retina, right eye: Secondary | ICD-10-CM

## 2024-10-15 DIAGNOSIS — H5789 Other specified disorders of eye and adnexa: Secondary | ICD-10-CM | POA: Diagnosis not present

## 2024-10-15 DIAGNOSIS — Z961 Presence of intraocular lens: Secondary | ICD-10-CM

## 2024-10-15 DIAGNOSIS — H35033 Hypertensive retinopathy, bilateral: Secondary | ICD-10-CM | POA: Diagnosis not present

## 2024-10-15 DIAGNOSIS — I10 Essential (primary) hypertension: Secondary | ICD-10-CM | POA: Diagnosis not present

## 2024-10-15 DIAGNOSIS — H25813 Combined forms of age-related cataract, bilateral: Secondary | ICD-10-CM

## 2024-10-15 DIAGNOSIS — H33322 Round hole, left eye: Secondary | ICD-10-CM | POA: Diagnosis not present

## 2024-10-15 DIAGNOSIS — H3521 Other non-diabetic proliferative retinopathy, right eye: Secondary | ICD-10-CM

## 2024-10-15 DIAGNOSIS — H33103 Unspecified retinoschisis, bilateral: Secondary | ICD-10-CM

## 2024-10-15 DIAGNOSIS — H353211 Exudative age-related macular degeneration, right eye, with active choroidal neovascularization: Secondary | ICD-10-CM | POA: Diagnosis not present

## 2024-10-15 DIAGNOSIS — H59021 Cataract (lens) fragments in eye following cataract surgery, right eye: Secondary | ICD-10-CM

## 2024-10-15 DIAGNOSIS — H3322 Serous retinal detachment, left eye: Secondary | ICD-10-CM

## 2024-10-15 DIAGNOSIS — H35711 Central serous chorioretinopathy, right eye: Secondary | ICD-10-CM

## 2024-10-15 MED ORDER — PREDNISOLONE ACETATE 1 % OP SUSP: 1.0000 [drp] | Freq: Two times a day (BID) | OPHTHALMIC | 3 refills | Status: AC

## 2024-10-18 ENCOUNTER — Encounter (INDEPENDENT_AMBULATORY_CARE_PROVIDER_SITE_OTHER): Payer: Self-pay | Admitting: Ophthalmology

## 2024-12-17 ENCOUNTER — Encounter (INDEPENDENT_AMBULATORY_CARE_PROVIDER_SITE_OTHER): Admitting: Ophthalmology

## 2025-03-14 ENCOUNTER — Ambulatory Visit: Admitting: Obstetrics and Gynecology
